# Patient Record
Sex: Male | Born: 1945 | Race: White | Hispanic: No | Smoking: Former smoker
Health system: Southern US, Community
[De-identification: ages and names within clinical notes are randomized; demographics above are authoritative.]

## PROBLEM LIST (undated history)

## (undated) DIAGNOSIS — I2510 Atherosclerotic heart disease of native coronary artery without angina pectoris: Secondary | ICD-10-CM

## (undated) DIAGNOSIS — I10 Essential (primary) hypertension: Secondary | ICD-10-CM

## (undated) DIAGNOSIS — Z9289 Personal history of other medical treatment: Secondary | ICD-10-CM

## (undated) DIAGNOSIS — M1990 Unspecified osteoarthritis, unspecified site: Secondary | ICD-10-CM

## (undated) DIAGNOSIS — G8929 Other chronic pain: Secondary | ICD-10-CM

## (undated) DIAGNOSIS — F329 Major depressive disorder, single episode, unspecified: Secondary | ICD-10-CM

## (undated) DIAGNOSIS — N289 Disorder of kidney and ureter, unspecified: Secondary | ICD-10-CM

## (undated) DIAGNOSIS — I499 Cardiac arrhythmia, unspecified: Secondary | ICD-10-CM

## (undated) DIAGNOSIS — T8859XA Other complications of anesthesia, initial encounter: Secondary | ICD-10-CM

## (undated) DIAGNOSIS — I5031 Acute diastolic (congestive) heart failure: Secondary | ICD-10-CM

## (undated) DIAGNOSIS — T4145XA Adverse effect of unspecified anesthetic, initial encounter: Secondary | ICD-10-CM

## (undated) DIAGNOSIS — I4510 Unspecified right bundle-branch block: Secondary | ICD-10-CM

## (undated) DIAGNOSIS — E785 Hyperlipidemia, unspecified: Secondary | ICD-10-CM

## (undated) DIAGNOSIS — K219 Gastro-esophageal reflux disease without esophagitis: Secondary | ICD-10-CM

## (undated) DIAGNOSIS — M545 Low back pain, unspecified: Secondary | ICD-10-CM

## (undated) DIAGNOSIS — F32A Depression, unspecified: Secondary | ICD-10-CM

## (undated) DIAGNOSIS — D649 Anemia, unspecified: Secondary | ICD-10-CM

## (undated) DIAGNOSIS — D696 Thrombocytopenia, unspecified: Secondary | ICD-10-CM

## (undated) DIAGNOSIS — F419 Anxiety disorder, unspecified: Secondary | ICD-10-CM

## (undated) DIAGNOSIS — N200 Calculus of kidney: Secondary | ICD-10-CM

## (undated) DIAGNOSIS — E119 Type 2 diabetes mellitus without complications: Secondary | ICD-10-CM

## (undated) HISTORY — DX: Atherosclerotic heart disease of native coronary artery without angina pectoris: I25.10

## (undated) HISTORY — PX: CHOLECYSTECTOMY: SHX55

## (undated) HISTORY — PX: APPENDECTOMY: SHX54

## (undated) HISTORY — PX: BACK SURGERY: SHX140

## (undated) HISTORY — DX: Hyperlipidemia, unspecified: E78.5

## (undated) HISTORY — PX: CARDIAC CATHETERIZATION: SHX172

---
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
## 1989-02-22 HISTORY — PX: SHOULDER ARTHROSCOPY W/ ROTATOR CUFF REPAIR: SHX2400

## 1998-06-27 ENCOUNTER — Encounter: Payer: Self-pay | Admitting: Family Medicine

## 1998-06-27 ENCOUNTER — Ambulatory Visit (HOSPITAL_COMMUNITY): Admission: RE | Admit: 1998-06-27 | Discharge: 1998-06-27 | Payer: Self-pay | Admitting: Family Medicine

## 1999-06-25 DIAGNOSIS — Z9289 Personal history of other medical treatment: Secondary | ICD-10-CM

## 1999-06-25 HISTORY — DX: Personal history of other medical treatment: Z92.89

## 1999-06-25 HISTORY — PX: CORONARY ARTERY BYPASS GRAFT: SHX141

## 1999-07-04 ENCOUNTER — Inpatient Hospital Stay (HOSPITAL_COMMUNITY): Admission: EM | Admit: 1999-07-04 | Discharge: 1999-07-14 | Payer: Self-pay | Admitting: Emergency Medicine

## 1999-07-04 ENCOUNTER — Encounter: Payer: Self-pay | Admitting: Emergency Medicine

## 1999-07-05 ENCOUNTER — Encounter: Payer: Self-pay | Admitting: Cardiothoracic Surgery

## 1999-07-09 ENCOUNTER — Encounter: Payer: Self-pay | Admitting: Cardiothoracic Surgery

## 1999-07-10 ENCOUNTER — Encounter: Payer: Self-pay | Admitting: Cardiothoracic Surgery

## 1999-07-11 ENCOUNTER — Encounter: Payer: Self-pay | Admitting: Cardiothoracic Surgery

## 2000-12-01 ENCOUNTER — Encounter: Admission: RE | Admit: 2000-12-01 | Discharge: 2000-12-01 | Payer: Self-pay | Admitting: Otolaryngology

## 2000-12-01 ENCOUNTER — Encounter: Payer: Self-pay | Admitting: Otolaryngology

## 2001-08-20 ENCOUNTER — Encounter: Payer: Self-pay | Admitting: Internal Medicine

## 2001-08-20 ENCOUNTER — Inpatient Hospital Stay (HOSPITAL_COMMUNITY): Admission: EM | Admit: 2001-08-20 | Discharge: 2001-08-22 | Payer: Self-pay | Admitting: *Deleted

## 2002-03-18 ENCOUNTER — Encounter: Payer: Self-pay | Admitting: Orthopedic Surgery

## 2002-03-19 ENCOUNTER — Encounter: Payer: Self-pay | Admitting: Orthopedic Surgery

## 2002-03-19 ENCOUNTER — Ambulatory Visit (HOSPITAL_COMMUNITY): Admission: RE | Admit: 2002-03-19 | Discharge: 2002-03-19 | Payer: Self-pay | Admitting: Orthopedic Surgery

## 2003-09-08 ENCOUNTER — Inpatient Hospital Stay (HOSPITAL_COMMUNITY): Admission: RE | Admit: 2003-09-08 | Discharge: 2003-09-14 | Payer: Self-pay | Admitting: Psychiatry

## 2004-01-24 ENCOUNTER — Ambulatory Visit (HOSPITAL_COMMUNITY): Admission: RE | Admit: 2004-01-24 | Discharge: 2004-01-24 | Payer: Self-pay

## 2004-01-24 ENCOUNTER — Encounter (INDEPENDENT_AMBULATORY_CARE_PROVIDER_SITE_OTHER): Payer: Self-pay | Admitting: *Deleted

## 2009-07-03 HISTORY — PX: CARDIOVASCULAR STRESS TEST: SHX262

## 2010-06-04 ENCOUNTER — Ambulatory Visit: Payer: Self-pay | Admitting: Cardiology

## 2010-08-23 HISTORY — PX: CORONARY ANGIOPLASTY WITH STENT PLACEMENT: SHX49

## 2010-08-29 ENCOUNTER — Other Ambulatory Visit: Payer: Self-pay | Admitting: Orthopedic Surgery

## 2010-08-29 ENCOUNTER — Ambulatory Visit
Admission: RE | Admit: 2010-08-29 | Discharge: 2010-08-29 | Disposition: A | Discharge status: Home / Self Care | Payer: Medicare Other | Source: Ambulatory Visit | Attending: Orthopedic Surgery | Admitting: Orthopedic Surgery

## 2010-08-29 DIAGNOSIS — M545 Low back pain, unspecified: Secondary | ICD-10-CM

## 2010-09-03 ENCOUNTER — Other Ambulatory Visit: Payer: Self-pay | Admitting: Specialist

## 2010-09-03 DIAGNOSIS — M542 Cervicalgia: Secondary | ICD-10-CM

## 2010-09-04 ENCOUNTER — Ambulatory Visit
Admission: RE | Admit: 2010-09-04 | Discharge: 2010-09-04 | Disposition: A | Discharge status: Home / Self Care | Payer: Medicare Other | Source: Ambulatory Visit | Attending: Specialist | Admitting: Specialist

## 2010-09-04 DIAGNOSIS — M542 Cervicalgia: Secondary | ICD-10-CM

## 2010-09-07 ENCOUNTER — Emergency Department (HOSPITAL_COMMUNITY): Payer: Medicare Other

## 2010-09-07 ENCOUNTER — Inpatient Hospital Stay (HOSPITAL_COMMUNITY)
Admission: EM | Admit: 2010-09-07 | Discharge: 2010-09-08 | DRG: 249 | Disposition: A | Discharge status: Home / Self Care | Payer: Medicare Other | Attending: Cardiology | Admitting: Cardiology

## 2010-09-07 DIAGNOSIS — Z7982 Long term (current) use of aspirin: Secondary | ICD-10-CM

## 2010-09-07 DIAGNOSIS — E669 Obesity, unspecified: Secondary | ICD-10-CM | POA: Diagnosis present

## 2010-09-07 DIAGNOSIS — I200 Unstable angina: Secondary | ICD-10-CM | POA: Diagnosis present

## 2010-09-07 DIAGNOSIS — R079 Chest pain, unspecified: Secondary | ICD-10-CM

## 2010-09-07 DIAGNOSIS — E119 Type 2 diabetes mellitus without complications: Secondary | ICD-10-CM | POA: Diagnosis present

## 2010-09-07 DIAGNOSIS — I2510 Atherosclerotic heart disease of native coronary artery without angina pectoris: Secondary | ICD-10-CM | POA: Diagnosis present

## 2010-09-07 DIAGNOSIS — I25810 Atherosclerosis of coronary artery bypass graft(s) without angina pectoris: Secondary | ICD-10-CM

## 2010-09-07 DIAGNOSIS — Z794 Long term (current) use of insulin: Secondary | ICD-10-CM

## 2010-09-07 DIAGNOSIS — I10 Essential (primary) hypertension: Secondary | ICD-10-CM | POA: Diagnosis present

## 2010-09-07 DIAGNOSIS — F329 Major depressive disorder, single episode, unspecified: Secondary | ICD-10-CM | POA: Diagnosis present

## 2010-09-07 DIAGNOSIS — F3289 Other specified depressive episodes: Secondary | ICD-10-CM | POA: Diagnosis present

## 2010-09-07 LAB — CBC
HCT: 37.8 % — ABNORMAL LOW (ref 39.0–52.0)
Hemoglobin: 13.3 g/dL (ref 13.0–17.0)
MCH: 30.6 pg (ref 26.0–34.0)
MCH: 30.2 pg (ref 26.0–34.0)
MCHC: 35.4 g/dL (ref 30.0–36.0)
MCHC: 34.7 g/dL (ref 30.0–36.0)
MCV: 87.1 fL (ref 78.0–100.0)
Platelets: 118 10*3/uL — ABNORMAL LOW (ref 150–400)
Platelets: 116 10*3/uL — ABNORMAL LOW (ref 150–400)
RBC: 4.35 MIL/uL (ref 4.22–5.81)
RBC: 4.34 MIL/uL (ref 4.22–5.81)
RDW: 12.6 % (ref 11.5–15.5)
WBC: 4.4 10*3/uL (ref 4.0–10.5)

## 2010-09-07 LAB — COMPREHENSIVE METABOLIC PANEL
AST: 32 U/L (ref 0–37)
CO2: 26 mEq/L (ref 19–32)
Calcium: 8.6 mg/dL (ref 8.4–10.5)
Chloride: 109 mEq/L (ref 96–112)
Creatinine, Ser: 1.17 mg/dL (ref 0.4–1.5)
GFR calc Af Amer: >60 mL/min (ref >60)

## 2010-09-07 LAB — HEPARIN LEVEL (UNFRACTIONATED): Heparin Unfractionated: <0.1 IU/mL — ABNORMAL LOW (ref 0.30–0.70)

## 2010-09-07 LAB — CARDIAC PANEL(CRET KIN+CKTOT+MB+TROPI)
Relative Index: 1.2 (ref 0.0–2.5)
Total CK: 166 U/L (ref 7–232)

## 2010-09-07 LAB — POCT CARDIAC MARKERS: Myoglobin, poc: 92.6 ng/mL (ref 12–200)

## 2010-09-07 LAB — CK TOTAL AND CKMB (NOT AT ARMC)
CK, MB: 1.9 ng/mL (ref 0.3–4.0)
Relative Index: 1.2 (ref 0.0–2.5)

## 2010-09-07 LAB — LIPID PANEL
HDL: 41 mg/dL (ref >39)
Total CHOL/HDL Ratio: 4 RATIO

## 2010-09-07 LAB — DIFFERENTIAL
Basophils Absolute: 0 10*3/uL (ref 0.0–0.1)
Eosinophils Absolute: 0.1 10*3/uL (ref 0.0–0.7)
Eosinophils Absolute: 0.1 10*3/uL (ref 0.0–0.7)
Eosinophils Relative: 3 % (ref 0–5)
Lymphs Abs: 2.1 10*3/uL (ref 0.7–4.0)
Neutrophils Relative %: 33 % — ABNORMAL LOW (ref 43–77)

## 2010-09-07 LAB — TROPONIN I: Troponin I: 0.04 ng/mL (ref 0.00–0.06)

## 2010-09-07 LAB — APTT: aPTT: 30 seconds (ref 24–37)

## 2010-09-07 LAB — PROTIME-INR: Prothrombin Time: 13 seconds (ref 11.6–15.2)

## 2010-09-07 LAB — HEMOGLOBIN A1C: Hgb A1c MFr Bld: 8.8 % — ABNORMAL HIGH (ref <5.7)

## 2010-09-07 LAB — BASIC METABOLIC PANEL
BUN: 13 mg/dL (ref 6–23)
CO2: 26 mEq/L (ref 19–32)
Chloride: 106 mEq/L (ref 96–112)
Sodium: 135 mEq/L (ref 135–145)

## 2010-09-07 LAB — GLUCOSE, CAPILLARY

## 2010-09-08 DIAGNOSIS — R079 Chest pain, unspecified: Secondary | ICD-10-CM

## 2010-09-08 LAB — BASIC METABOLIC PANEL
CO2: 26 mEq/L (ref 19–32)
Calcium: 8.4 mg/dL (ref 8.4–10.5)
Chloride: 104 mEq/L (ref 96–112)
GFR calc non Af Amer: 55 mL/min — ABNORMAL LOW (ref >60)
Glucose, Bld: 219 mg/dL — ABNORMAL HIGH (ref 70–99)
Potassium: 4 mEq/L (ref 3.5–5.1)
Sodium: 135 mEq/L (ref 135–145)

## 2010-09-08 LAB — CBC
Platelets: 107 10*3/uL — ABNORMAL LOW (ref 150–400)
RDW: 12.6 % (ref 11.5–15.5)
WBC: 4.2 10*3/uL (ref 4.0–10.5)

## 2010-09-08 LAB — GLUCOSE, CAPILLARY
Glucose-Capillary: 258 mg/dL — ABNORMAL HIGH (ref 70–99)
Glucose-Capillary: 229 mg/dL — ABNORMAL HIGH (ref 70–99)

## 2010-09-09 NOTE — Discharge Summary (Addendum)
NAMEANANTH, FIALLOS                ACCOUNT NO.:  [id number]  MEDICAL RECORD NO.:  [id number]           PATIENT TYPE:  I  LOCATION:  2927                         FACILITY:  [facility]  PHYSICIAN:  Vesta Mixer, M.D. DATE OF BIRTH:  1945-10-25  DATE OF ADMISSION:  09/07/2010 DATE OF DISCHARGE:  09/08/2010                              DISCHARGE SUMMARY   DISCHARGE DIAGNOSES: 1. Chest pain consistent with unstable angina with catheterization     showing occluded saphenous vein graft to the right coronary artery     and high-grade disease and saphenous vein graft to the sequential     obtuse marginal 1/obtuse marginal 2, status post successful     percutaneous coronary intervention/bare-metal stent placement to     the saphenous vein graft to the ramus intermedius and obtuse     marginal on September 07, 2010. 2. Coronary artery disease, status post coronary artery bypass graft     in 2001. 3. Hypertension. 4. Insulin-dependent diabetes. 5. Obesity. 6. Back pain, back surgery postponed 6 weeks.  HOSPITAL COURSE:  Mr. Churchwell is a 65 year old gentleman with a history of known CAD, hypertension, and insulin-dependent diabetes who has developed progressive anginal symptoms.  He was previously able to walk 2 miles a day at the gym and track but, however, over the past month has had increasing angina with his daily walk requiring him to sit down and rest.  The symptoms have increased in frequency and intensity.  He had a number of episodes while bringing groceries on the day of admission. His symptoms are felt consistent with unstable angina, and he was started on statin, IV heparin, and continued on home dose of aspirin, beta blocker, and Imdur.  He underwent cardiac catheterization yesterday, which demonstrated high-grade disease of the SVG to the ramus intermedius and OM.  Dr. Excell Seltzer subsequently placed bare-metal stent platform into this vessel.  He recommended that the patient receive  at least 30 days of Effient as well as long-term low-dose aspirin.  Dr. Elease Hashimoto felt that his back surgery should be postponed for 6 weeks.  The patient did well post procedurally and ambulated well with cardiac rehab.  Dr. Elease Hashimoto has seen and examined him today and feels he is stable for discharge.  DISCHARGE LABORATORIES:  WBC 4.2, hemoglobin 11.9, hematocrit 35.2, platelet count 107.  Sodium 135, potassium 4.0, chloride 104, CO2 26, glucose 219, BUN 11, creatinine 1.30.  LFTs within normal limits on September 07, 2010.  A1c is 8.8.  Total cholesterol 173, triglycerides 32, HDL 27.  FOBT was negative.  STUDIES: 1. Chest x-ray on September 07, 2010, showed no evidence of active     pulmonary disease. 2. Cardiac catheterization on September 07, 2010, please see full report     for details.  DISCHARGE MEDICATIONS: 1. Aspirin 81 mg daily. 2. Nitroglycerin sublingual 0.4 mg every 5 minutes as needed up to 3     doses. 3. Effient 10 mg daily. 4. Crestor 20 mg daily. 5. Amlodipine 5 mg daily. 6. Coreg 25 mg b.i.d. 7. Cholecalciferol 2000 units 1 tablet daily. 8. Citalopram 20 mg nightly.  9. Hydrocodone/APAP 10/325 mg 1-2 tablets q.4 h. p.r.n. severe pain. 10.Imdur 30 mg daily. 11.Lisinopril 5 mg half tablet daily. 12.NovoLog Mix 70/30, 40 units subcutaneously b.i.d. 13.Omeprazole 20 mg b.i.d. 14.Symlin 120 mcg subcutaneously t.i.d. 15.Zolpidem 10 mg daily nightly p.r.n.  DISPOSITION:  The patient will be discharged in stable condition to home.  He is instructed to increase activity slowly and not to lift or participate in sexual activity for 1 week.  He is not to drive for 2 days.  He is to follow a low-sodium heart-healthy diabetic diet.  If he notices any pain, swelling, bleeding, or pus at his cath site, he is to call or return.  He will follow up with Dr. Patty Sermons in approximately 1- 2 weeks and our office will call him with this appointment.  Dr. Elease Hashimoto has suggested that his back  surgery will be delayed for 6 weeks.  DURATION OF DISCHARGE ENCOUNTER:  Greater than 30 minutes including physician and PA time.     Ronie Spies, P.A.C.   ______________________________ Vesta Mixer, M.D.    DD/MEDQ  D:  09/08/2010  T:  09/09/2010  Job:  [id number]  cc:   Cassell Clement, M.D.  Electronically Signed by Ronie Spies  on 09/09/2010 02:05:13 PM Electronically Signed by Kristeen Miss M.D. on 10/02/2010 09:07:05 AM

## 2010-09-10 LAB — GLUCOSE, CAPILLARY
Glucose-Capillary: 199 mg/dL — ABNORMAL HIGH (ref 70–99)
Glucose-Capillary: 194 mg/dL — ABNORMAL HIGH (ref 70–99)

## 2010-09-11 NOTE — Procedures (Signed)
NAMEHECTOR, Tyler Le NO.:  [id number]  MEDICAL RECORD NO.:  [id number]           PATIENT TYPE:  I  LOCATION:  2927                         FACILITY:  [facility]  PHYSICIAN:  Tyler Fells. Excell Seltzer, MD  DATE OF BIRTH:  1946/01/12  DATE OF PROCEDURE:  09/07/2010 DATE OF DISCHARGE:                           CARDIAC CATHETERIZATION   PROCEDURE:  Percutaneous transluminal coronary angioplasty and stenting of the saphenous vein graft sequenced to ramus intermedius and obtuse marginal 1.  PROCEDURAL INDICATION:  Tyler Le is a 65 year old gentleman who presented with unstable angina.  He underwent diagnostic catheterization this evening by Dr. Antoine Le.  The patient has had prior coronary artery bypass surgery in 2001.  He was noted to have severe diffuse distal vessel disease.  He has a vein graft that is sequenced to the ramus intermedius and obtuse marginal branch that has critical ostial disease. The distal branch vessels also have severe disease.  The patient had been scheduled for upcoming back surgery next week.  We felt his coronary artery disease was critical and decided to initially just treat his saphenous vein graft ostial lesion with plans for a bare-metal stent platform.  While we were prepping for the intervention, the patient developed severe chest discomfort.  We had to start him on IV nitroglycerin, gave him narcotic analgesics, and gave him IV beta blocker.  We upsized a 6-French sheath and started him on bivalirudin. He was given 60 mg of Effient.  A 6-French AL-1 guide was inserted and Whisper guidewire was advanced across the lesion.  I was not able to cross the lesion with a Cougar wire.  The vessel was predilated with a 2.5 x 12 mm apex balloon, which was taken to 10 atmospheres on 2 inflations.  The patient's pain began to improve after the vessel was dilated.  At that point, we put in a 5-mm spider distal embolic protection device.  I initially  attempted to place a 4.0 x 18 mm Multilink Vision bare-metal stent, but the stent was too short when it was positioned.  It was taken out undeployed and a 4.0 x 23 mm Multilink Vision stent was carefully positioned, it then deployed at 14 atmospheres.  The proximal part of the stent seemed underexpanded and it was postdilated with a 4.0 x 12 mm [place] Quantum apex, which was taken to 16 and 18 atmospheres.  There was an excellent angiographic result with TIMI 3 flow.  The patient tolerated the procedure well.  The spider device was retrieved with retrieval sheath.  The femoral angiogram demonstrated access just below the femoral bifurcation, and we decided to use manual pressure for hemostasis.  FINAL CONCLUSIONS:  Successful PCI of the saphenous vein graft to ramus intermedius and obtuse marginal using a bare-metal stent platform.  RECOMMENDATIONS:  The patient should receive at least 30 days of Effient as well as long-term low-dose aspirin.  He would benefit from 12 months of Effient in the setting of his acute coronary syndrome.  I am not sure if he is going to be a candidate for back surgery, and we will discuss this with Dr.  Brackbill, his primary cardiologist.     Tyler Fells. Excell Seltzer, MD     MDC/MEDQ  D:  09/07/2010  T:  09/08/2010  Job:  [id number]  cc:   Tyler Le, M.D. Tyler Le, M.D.  Electronically Signed by Tyler Bollman MD on 09/11/2010 06:04:17 PM

## 2010-09-12 ENCOUNTER — Other Ambulatory Visit (HOSPITAL_COMMUNITY): Payer: Medicare Other

## 2010-09-17 ENCOUNTER — Inpatient Hospital Stay (HOSPITAL_COMMUNITY): Admission: RE | Admit: 2010-09-17 | Payer: Medicare Other | Source: Ambulatory Visit | Admitting: Specialist

## 2010-10-03 ENCOUNTER — Encounter: Payer: Self-pay | Admitting: Cardiology

## 2010-10-03 ENCOUNTER — Encounter: Payer: Self-pay | Admitting: *Deleted

## 2010-10-03 ENCOUNTER — Telehealth: Payer: Self-pay | Admitting: Cardiology

## 2010-10-03 ENCOUNTER — Ambulatory Visit (INDEPENDENT_AMBULATORY_CARE_PROVIDER_SITE_OTHER): Payer: Medicare Other | Admitting: Cardiology

## 2010-10-03 DIAGNOSIS — I119 Hypertensive heart disease without heart failure: Secondary | ICD-10-CM | POA: Insufficient documentation

## 2010-10-03 DIAGNOSIS — E785 Hyperlipidemia, unspecified: Secondary | ICD-10-CM | POA: Insufficient documentation

## 2010-10-03 DIAGNOSIS — I209 Angina pectoris, unspecified: Secondary | ICD-10-CM | POA: Insufficient documentation

## 2010-10-03 DIAGNOSIS — E119 Type 2 diabetes mellitus without complications: Secondary | ICD-10-CM | POA: Insufficient documentation

## 2010-10-03 DIAGNOSIS — F419 Anxiety disorder, unspecified: Secondary | ICD-10-CM | POA: Insufficient documentation

## 2010-10-03 DIAGNOSIS — Z951 Presence of aortocoronary bypass graft: Secondary | ICD-10-CM

## 2010-10-03 DIAGNOSIS — M545 Low back pain: Secondary | ICD-10-CM

## 2010-10-03 MED ORDER — ISOSORBIDE MONONITRATE ER 30 MG PO TB24: 30.0000 mg | ORAL_TABLET | Freq: Every day | ORAL | Status: DC

## 2010-10-03 NOTE — Assessment & Plan Note (Signed)
The patient remains very anxious.  His wife feels that his anxiety often triggers his angina.  We will increase his Xanax to 0.5 mg 4 times a day

## 2010-10-03 NOTE — Assessment & Plan Note (Signed)
The patient was recently hospitalized from 09/07/10-09/08/10.  He was admitted with chest pain and he underwent cardiac catheterization and was found to have high-grade stenosis of the saphenous vein graft to the ramus intermedius and obtuse marginal.  Dr. Excell Seltzer placed a bare metal stent into the vessel.  The patient is now on caffeine for the next 30 days as well as long-term aspirin.  He has continued to have some vague pain in the left chest.  He has not been using any sublingual nitroglycerin and he does not think that he has been taking the Imdur that we described when he left the hospital.

## 2010-10-03 NOTE — Telephone Encounter (Signed)
PT WAS JUST HERE THIS MORNING AND  WANTS TO GO OVER MEDS. PT'S WIFE CALLED.

## 2010-10-03 NOTE — Assessment & Plan Note (Signed)
The patient was to have had back surgery recently but that had to be postponed because of his admission for chest pain and need for a bare metal stent.  The patient reports that his back is feeling a little better and we will postpone the back surgery a while longer.

## 2010-10-03 NOTE — Assessment & Plan Note (Signed)
The patient's diabetes has not been under optimal control.  The patient eats what he feels like eating and he is overweight.  His diabetes is followed at the [place].

## 2010-10-03 NOTE — Progress Notes (Signed)
History of Present Illness: This pleasant 65 year old gentleman is seen back for a post hospital office visit.  He was hospitalized on 09/07/10 with chest pain and underwent cardiac catheterization and bare-metal stent to the saphenous vein graft to the ramus intermedius and obtuse marginal vessel.  He is now on caffeine for at least the next 30 days following the stent and he'll be on long-term low-dose aspirin.  He has a history of coronary disease and had his original coronary artery bypass graft surgery in 2001.  He also has a history of dyslipidemia essential hypertension and insulin-dependent diabetes as well as obesity.  His back surgery has been postponed for at least 6 weeks post stent.  Current Outpatient Prescriptions  Medication Sig Dispense Refill  . ALPRAZolam (XANAX) 0.5 MG tablet Take 0.5 mg by mouth 3 (three) times daily as needed.        Marland Kitchen amLODipine (NORVASC) 5 MG tablet Take 5 mg by mouth daily.        Marland Kitchen aspirin 325 MG tablet Take 325 mg by mouth daily.        . carvedilol (COREG) 25 MG tablet Take 25 mg by mouth 2 (two) times daily with a meal.        . ergocalciferol (VITAMIN D2) 50000 UNITS capsule Take 3,000 Units by mouth daily.        Marland Kitchen guaiFENesin (MUCINEX) 600 MG 12 hr tablet Take 1,200 mg by mouth 2 (two) times daily.        Marland Kitchen ibuprofen (ADVIL,MOTRIN) 200 MG tablet Take 200 mg by mouth every 6 (six) hours as needed.        . insulin glargine (LANTUS) 100 UNIT/ML injection Inject 40 Units into the skin 2 (two) times daily.       Marland Kitchen lisinopril (PRINIVIL,ZESTRIL) 10 MG tablet Take 10 mg by mouth daily.        . Multiple Vitamin (MULTIVITAMIN) tablet Take 1 tablet by mouth daily.        . pramlintide (SYMLIN) 600 MCG/ML injection Inject 60 mcg into the skin 2 (two) times daily.        . rosuvastatin (CRESTOR) 10 MG tablet Take 10 mg by mouth daily.        . sertraline (ZOLOFT) 100 MG tablet Take 100 mg by mouth daily.        Marland Kitchen zolpidem (AMBIEN) 10 MG tablet Take 10 mg by  mouth at bedtime as needed.        Marland Kitchen DISCONTD: isosorbide dinitrate (ISORDIL) 40 MG tablet Take 40 mg by mouth daily. 1/2 tablet daily       . isosorbide mononitrate (IMDUR) 30 MG 24 hr tablet Take 1 tablet (30 mg total) by mouth daily.  90 tablet  3  . simvastatin (ZOCOR) 20 MG tablet Take 20 mg by mouth at bedtime.          Allergies  Allergen Reactions  . Altace   . Metformin And Related   . Pravachol   . Wellbutrin (Bupropion Hcl)     Patient Active Problem List  Diagnoses  . S/P CABG (coronary artery bypass graft)  . Angina pectoris  . Diabetes mellitus  . Dyslipidemia  . Low back pain  . Anxiety  . Benign hypertensive heart disease without heart failure    History  Smoking status  . Former Smoker  . Quit date: 06/24/1988  Smokeless tobacco  . Not on file    History  Alcohol Use No  Family History  Problem Relation Age of Onset  . Heart disease Mother   . Heart disease Father   . Heart attack Father   . Heart disease Brother     Review of Systems: Constitutional: no fever chills diaphoresis or fatigue or change in weight.  Head and neck: no hearing loss, no epistaxis, no photophobia or visual disturbance. Respiratory: No cough, shortness of breath or wheezing. Cardiovascular: No chest pain peripheral edema, palpitations. Gastrointestinal: No abdominal distention, no abdominal pain, no change in bowel habits hematochezia or melena. Genitourinary: No dysuria, no frequency, no urgency, no nocturia. Musculoskeletal:No arthralgias, no back pain, no gait disturbance or myalgias. Neurological: No dizziness, no headaches, no numbness, no seizures, no syncope, no weakness, no tremors. Hematologic: No lymphadenopathy, no easy bruising. Psychiatric: No confusion, no hallucinations, no sleep disturbance.    Physical Exam: Filed Vitals:   10/03/10 1022  BP: 144/88  Pulse: 84  Weight is 279, down 2 pounds.  The general appearance reveals a well-developed large  gentleman in no acute distress.Pupils equal and reactive.   Extraocular Movements are full.  There is no scleral icterus.  The mouth and pharynx are normal.  The neck is supple.  The carotids reveal no bruits.  The jugular venous pressure is normal.  The thyroid is not enlarged.  There is no lymphadenopathy.The chest is clear to percussion and auscultation. There are no rales or rhonchi. Expansion of the chest is symmetrical.The precordium is quiet.  The first heart sound is normal.  The second heart sound is physiologically split.  There is no murmur gallop rub or click.  There is no abnormal lift or heave.The abdomen is soft and nontender. Bowel sounds are normal. The liver and spleen are not enlarged. There Are no abdominal masses. There are no bruits.The pedal pulses are good.  There is no phlebitis or edema.  There is no cyanosis or clubbing.Strength is normal and symmetrical in all extremities.  There is no lateralizing weakness.  There are no sensory deficits.   Assessment / Plan: We should probably postpone his back surgery for several months unless his symptoms worsen to the point that he needs urgent surgery.  He reports that his back pain has actually improved since we last saw him.  He needs to work harder on diet and weight loss.  Needs to walk regularly for exercise.

## 2010-10-16 ENCOUNTER — Telehealth: Payer: Self-pay | Admitting: Cardiology

## 2010-10-16 NOTE — Telephone Encounter (Signed)
Since starting crestor he feels like his bones ache.  Previously taking simvastatin 80 mg 1/2 daily.  Please advise on crestor and effient.

## 2010-10-16 NOTE — Telephone Encounter (Signed)
Received call from patient has two questions, when he left the hospital, he was advised to take Crestor, he was taking Simvastatin, which he gets free from [place], can he take the Simvastatin?  And he was given a script for Effifedent (blood thinner) and was told to take for 30 days.  Why was he given 5 refills, will he have to continue taking this med?

## 2010-10-16 NOTE — Telephone Encounter (Signed)
Since he has a bare metal stent he needs a minimum of one month. If he can afford it stay on it longer otherwise stop it and take ASA alone [forever]. Okay to go back to the simvastatin if he was tolerating it and it is cheaper for him as long as the lipids stay in range.

## 2010-10-17 NOTE — Telephone Encounter (Signed)
Advised ok to go back on Zocor, but only 20 mg daily (patient takes Norvasc daily).  Patient will continue Effient for another month and then just continue ASA there after.  Patient verbalized understanding.

## 2010-10-17 NOTE — Telephone Encounter (Signed)
Will recheck labs at July office visit

## 2010-10-19 NOTE — Cardiovascular Report (Signed)
  NAMEBONIFACE, GOFFE NO.:  [id number]  MEDICAL RECORD NO.:  [id number]           PATIENT TYPE:  I  LOCATION:  2927                         FACILITY:  [facility]  PHYSICIAN:  Rollene Rotunda, MD, FACCDATE OF BIRTH:  1945-07-31  DATE OF PROCEDURE:  09/07/2010 DATE OF DISCHARGE:  09/08/2010                           CARDIAC CATHETERIZATION   CARDIOLOGIST:  Cassell Clement, MD  PRIMARY CARE PHYSICIAN:  Windle Guard, MD  PROCEDURE:  Left heart catheterization/coronary arteriography.  INDICATIONS:  Patient with unstable angina and known bypass grafting.  PROCEDURE:  Left heart catheterization was performed via the right femoral artery.  The aorta was cannulated using anterior wall puncture. A #5-French arterial sheath was inserted via the modified Seldinger technique.  Preformed Judkins and pigtail catheter were utilized.  The patient tolerated the procedure well.  RESULTS:  Hemodynamics:  LV 111/8, AO 111/67.  Coronaries:  The left main had distal 25% stenosis.  The LAD had long proximal 60% stenosis. There was a mid occlusion.  There was a distal long 60-70% stenosis. The diagonal branch was branching.  It was occluded at the ostium.  It filled via a vein graft.  There was a superior branch that back-filled across a long 60-70% stenosis.  Circumflex in the AV groove had diffuse 30-40% disease.  There was a ramus intermediate and an obtuse marginal that were occluded at the ostium and filled via grafts.  The ramus intermedius had proximal long 80% stenosis immediately after the insertion of the graft.  Second obtuse marginal had proximal 75% stenosis before the anastomosis of the graft.  The right coronary artery is dominant.  It is occluded proximally.  It fills via collaterals. There is a PDA which is long with proximal 80% stenosis.  Posterolateral is moderate sized with no high-grade disease.  Grafts:  LIMA to the LAD is patent.  The saphenous vein graft to  the right coronary artery is occluded.  Saphenous vein graft sequential to OM-1 and OM-2 has proximal 95% stenosis.  There is a 50% stenosis at the anastomosis.  There is diffuse 25-30% plaque scattered throughout.  Saphenous vein graft to the diagonal has diffuse 25-30% plaque throughout.  Left ventriculogram: The left ventriculogram was obtained in the RAO projection.  The EF was 65% with normal wall motion.  CONCLUSION:  Severe native three-vessel coronary disease.  Occluded saphenous vein graft to the right coronary artery.  High-grade disease in the saphenous vein graft to the ramus intermediate/obtuse marginal.  PLAN:  The patient will have percutaneous revascularization of the vein graft to the obtuse marginal as this is the most significant visual lesion.  However, he has very severe diffuse disease and will need aggressive medical management.     Rollene Rotunda, MD, [facility]     JH/MEDQ  D:  09/07/2010  T:  09/08/2010  Job:  [id number]  Electronically Signed by Rollene Rotunda MD [facility] on 10/19/2010 11:42:27 AM

## 2010-10-22 NOTE — H&P (Signed)
NAMEGERHARDT, GLEED NO.:  [id number]  MEDICAL RECORD NO.:  [id number]           PATIENT TYPE:  E  LOCATION:  [facility]                         FACILITY:  [facility]  PHYSICIAN:  Therisa Doyne, MD    DATE OF BIRTH:  1945/11/16  DATE OF ADMISSION:  09/07/2010 DATE OF DISCHARGE:                             HISTORY & PHYSICAL   Admission to [facility], Dr. Patty Sermons Service.  CHIEF COMPLAINT:  Chest pain.  HISTORY OF PRESENT ILLNESS:  A 65 year old white male with a past medical history significant for coronary artery disease, status post coronary bypass grafting who presents for evaluation of chest pain. Historically, the patient underwent five-vessel CABG in 2001.  He had a three-vessel native coronary artery disease.  His CABG include a LIMA to the LAD with a saphenous vein graft to the diagonal, saphenous vein graft to RCA and saphenous vein graft sequentially to the ramus in the OM.  He had a subsequent cardiac catheterization in 2003, which revealed native triple-vessel coronary artery disease.  However, all of his bypass grafts were patent.  There was some residual disease distal to his bypass grafts in his native coronary arteries.  However, this was medically treated.  For the past 1 month, the patient has had progressive anginal symptoms. Prior to 1 month ago, he was able to walk two miles a day at the gym on the track; however, over the past 1 month, he has had increasing angina when he does his daily walk.  After doing about 10 laps, he is to sit down and rest.  He developed left-sided chest pressure that radiates to his shoulder and back.  This evening, his symptoms increased in frequency and in intensity and he had three episodes this evening with even moderate activities such as bring in the groceries.  He had severe shortness of breath associated with as well as some mild diaphoresis. Because of these symptoms, he came to emergency department  for evaluation.  Of note, the patient is scheduled to have back surgery this coming month on September 17, 2010.  He does report bit compliance to his medication, but notes that he only takes his aspirin approximately two to three times per week.  PAST MEDICAL HISTORY: 1. Coronary artery disease, status post coronary artery bypass     grafting (see history of present illness). 2. Hypertension. 3. Insulin-dependent diabetes. 4. Obesity. 5. Back pain, but currently back surgery pending.  SOCIAL HISTORY:  The patient lives at home.  He denies any active tobacco or drug use.  He drinks less than two times per month.  FAMILY HISTORY:  Notable for father had myocardial infarction in his 50.  ALLERGIES:  No known drug allergies.  MEDICATIONS: 1. Aspirin 325 mg daily. 2. Symlin 60 mcg b.i.d. injection. 3. Omeprazole 20 mg b.i.d. 4. Norvasc 5 mg daily. 5. Lisinopril 2.5 mg daily. 6. Coreg 25 mg b.i.d. 7. Vitamin D3. 8. Ambien p.r.n. 9. Celexa 20 mg daily. 10.Imdur 30 mg. 11.Vicodin p.r.n. 12.NovoLog 70/30 40 units b.i.d.  REVIEW OF SYSTEMS:  All systems were reviewed and are negative except mentioned above  in History of present illness.  PHYSICAL EXAMINATION:  VITAL SIGNS:  Temperature afebrile, blood pressure is 120/60, heart rate 90, respirations 20, oxygen 97% on room air. GENERAL:  No acute distress. HEENT:  Normocephalic atraumatic.  Pupils are equal, round and reactive to light and accommodation.  Oropharynx pink and moist without any lesions. NECK:  Supple without lymphadenopathy.  No jugular venous distention. No masses. CARDIOVASCULAR:  Regular rate and rhythm with no murmurs, rubs or gallops. CHEST:  Clear to auscultation bilaterally. ABDOMEN:  Positive for bowel sounds, soft, nontender and nondistended. EXTREMITIES:  No clubbing, cyanosis or edema.  Dorsalis pedis pulses 2+ bilaterally.  Femoral pulses 2+ bilaterally. SKIN:  No rashes. NEUROLOGIC:  No focal  deficits. PSYCHIATRY:  Normal affect.  PERTINENT LABORATORY DATA:  CBC shows white count of 4.4, hemoglobin 13.3, platelets 118.  BMP is notable for BUN 13, creatinine 0.9. Troponin less than 0.05.  EKG from 12:47 a.m. demonstrates sinus rhythm at 90 beats per minute with an incomplete left bundle-branch block.  No signs of ischemia.  No change from previous EKGs.  Chest x-ray shows no acute cardiopulmonary disease.  IMPRESSION AND PLAN:  Mr. Perrot is a 65 year old white male with past medical history significant for coronary artery disease, status post coronary artery bypass grafting who presents with crescendo angina symptoms that are worrisome for unstable angina. 1. We will admit the patient to [facility] Cardiology under Dr.     Yevonne Pax care. 2. Chest pain.  The history is very concerning for unstable angina as     he had an increase in frequency and intensity of his anginal     episodes.  Given the last time he had an invasive evaluation was     almost 10 years ago and the fact that he has an upcoming back     surgery, I feel that an invasive evaluation wasof his coronaries      with coronary angiography would be appropriate to define     his coronary anatomy and better risk stratify him for his upcoming     surgery.  I have discussed this with the patient and he is     agreeable undergoing a cardiac catheterization (LCGP).  In the     interim time, we will medically treat him by starting on IV     heparin.  He is continue on his home dose of aspirin, beta-blocker     and Imdur.  We will be started him on Lipitor 40 mg daily.  We will     cycle cardiac enzymes and check a fasting lipid profile. 3. Hypertension, this seems to be well controlled.  Continue home     medications. 4. Diabetes mellitus.  Check hemoglobin A1c.  We will place the     patient on sliding scale insulin and cover him with a low-dose     Lantus 20 units at night. 5. Depression.  Continue home  Celexa. 6. Fluids, electrolytes, nutrition:  Saline lock, IV fluids,     electrolytes are stable, n.p.o. 7. Deep venous thrombosis prophylaxis, not indicated as the patient is     on systemic anticoagulation with heparin.     Therisa Doyne, MD     SJT/MEDQ  D:  09/07/2010  T:  09/07/2010  Job:  [id number]  Electronically Signed by Aldona Bar MD on 10/22/2010 09:50:59 PM

## 2010-11-09 NOTE — Cardiovascular Report (Signed)
[place]  Patient:    Tyler Le, Tyler Le Visit Number: [id number] MRN: [id number]          Service Type: MED Location: [id number] Attending Physician:  Tyler Le Dictated by:   Tyler Le., M.D. Proc. Date: 08/21/01 Admit Date:  08/20/2001   CC:         Tyler Le. Tyler Le, M.D.  Tyler Le, M.D.  Tyler Le, M.D.   Cardiac Catheterization  HISTORY: Tyler Le is a 65 year old gentleman with a history of coronary artery disease and is status post coronary artery bypass grafting.  He also has a history of borderline diabetes mellitus.  He had coronary artery bypass grafting x 5 in January 2001.  He has not felt well since that time and has had persistent episodes of angina.  He is admitted to the hospital for further episodes of chest pain.  He ruled out for myocardial infarction.  CARDIOLOGIST:  Tyler Le., M.D.  PROCEDURE:  Left heart catheterization with coronary angiography.  DESCRIPTION OF PROCEDURE:  The right femoral artery was easily cannulated using a modified Seldinger technique.  HEMODYNAMICS:  The left ventricular pressure was 118/17 with an aortic pressure was 117/74.  ANGIOGRAPHY:  The left main coronary artery has minor luminal irregularities.  The left anterior descending artery is moderately diseased and is then occluded.  The LAD can be seen later filling from the internal mammary graft.  The first diagonal vessel arises before the occlusion of the LAD.  It is a moderate to small vessel.  There is diffuse disease throughout its course. There is a 95% stenosis in the distal aspect of this graft.  There is competitive filling of the diagonal vessel but proximal to this stenosis.  The left circumflex artery is a moderate size vessel.  It is occluded distally.  There is a small intermediate branch which is occluded at its mid point.  The right coronary artery is  occluded proximally.  The saphenous vein graft to the right coronary artery is a large graft.  The anastomosis to the distal right coronary artery is fairly normal.  There is a long 60 to 70% stenosis in the distal right coronary artery just distal to the anastomosis at this site.  This stenosis is in the vessel that supplied the posterolateral segment artery and a small posterior descending artery.  In the mid right coronary artery going up retrograde from the graft, there is a tight 95% stenosis prior to giving off an RV marginal branch.  This stenosis is fairly tight.  It does not appear that this would be accessible with angioplasty.  The saphenous vein graft to the diagonal vessel is a very small vessel.  It is relatively normal.  The saphenous vein graft to the intermediate branch and to the obtuse marginal branch is large and normal.  The anastomosis is normal.  LEFT VENTRICULOGRAM:  A left ventriculogram reveals normal left ventricular systolic function.  There are no segmental wall motion abnormalities.  CONCLUSIONS:  The patient has severe native vessel coronary artery disease. The saphenous vein grafts appear to be fairly normal.  The graft to the diagonal vessel anastomosis is prior to a tight stenosis.  This tight stenosis goes very far out on the diagonal vessel and may have been too far out to place the saphenous vein graft.  The right coronary artery graft anastomosis is normal, but there are two separate stenoses that block  flow to the distal right coronary artery.  In the original angiograms, the right coronary artery was occluded so that we could only see faint filling of the distal right coronary artery by collaterals. Unfortunately, the right coronary artery was diffusely diseased, and there are stenoses on either side of the graft anastomosis.  I do not think that either of these sites are very accessible with angioplasty.  The diagonal vessel is very small,  and the anastomosis is quite distal.  The right coronary artery has two discrete stenoses on either side of the anastomosis of the graft.  The less severe of the two stenoses would be accessible, but it does not appear to be flow limiting.  The more tight of the two stenoses would require a bend of approximately 120 to 150 degrees and then a second 180 degree bend.  I think it would be too risky to attempt angioplasty of this lesion.  We will continue with medical therapy.  I have discussed this case with Dr. Patty Le. Dictated by:   Tyler Le., M.D. Attending Physician:  Tyler Le DD:  08/21/01 TD:  08/21/01 Job: [id number]

## 2010-11-09 NOTE — Op Note (Signed)
NAME:  Tyler Le, Tyler Le                          ACCOUNT NO.:  [id number]   MEDICAL RECORD NO.:  [id number]                   PATIENT TYPE:  AMB   LOCATION:  DAY                                  FACILITY:  [facility]   PHYSICIAN:  Lorre Munroe., M.D.            DATE OF BIRTH:  1946/03/05   DATE OF PROCEDURE:  01/24/2004  DATE OF DISCHARGE:                                 OPERATIVE REPORT   PREOPERATIVE DIAGNOSES:  1. Symptomatic gallstones.  2. Chronic cholecystitis.   POSTOPERATIVE DIAGNOSES:  1. Symptomatic gallstones.  2. Chronic cholecystitis.   OPERATION:  Laparoscopic cholecystectomy.   SURGEON:  Lebron Conners, M.D.   ASSISTANTEarlene Plater.   ANESTHESIA:  General.   PROCEDURE:  After the patient was monitored and anesthetized and had routine  preparation and draping of abdomen, I liberally infused local anesthetic  just below the umbilicus and subsequently at three additional sites for  placement of ports.  I made a sharp transverse incision below the umbilicus,  dissected down through the fat to the fascia, opened the fascia  longitudinally in the midline and bluntly opened the peritoneum.  After placing the 0 Vicryl pursestring suture in the fascia and securing a  Hasson disposable cannula, I inflated the abdomen with CO2 and put in the  camera.  I saw no abnormalities.  The gallbladder was somewhat distended but  not acutely inflamed.  After placement of the three additional ports under  direct vision through anesthetized sites and positioning the patient head  up, foot down and tilted toward the left, I retracted the fundus of the  gallbladder towards the right shoulder and took down the few adhesions which  were present from the gallbladder to other viscera and pulled the  infundibulum laterally.  Because the gallbladder was tightly distended, it  was necessary to decompress it with a suction aspirator and then handling  was much easier.  I dissected the  hepatoduodenal ligament anteriorly and  posteriorly until there was a large window between the cystic duct and the  liver and I could clearly see the cystic duct emerging from the infundibulum  on the gallbladder.  I clipped the cystic duct with four clips and divided  between the two which were closest to the gallbladder.  I then similarly  dissected, clipped and divided the cystic artery.  I found one additional  small artery between the liver and gallbladder as I dissected upward,  removing the gallbladder.  Retracting the gallbladder laterally, I used the  spatula and hook dissectors with cautery to detach it from the liver.  I  gained hemostasis in the gallbladder fossa with the cautery.  After  detaching the gallbladder, I placed it in a plastic pouch and subsequently  removed it through the umbilical incision and tied the pursestring suture.  I copiously irrigated the gallbladder fossa and right upper quadrant and  removed that irrigant.  After  that was  done and the gallbladder had been removed, I removed the lateral ports under  direct vision then allowed the CO2 to escape and removed the epigastric  port.  I closed all of the skin incisions with intracuticular 4-0 Vicryl and  Steri-Strips.  The patient was stable throughout the procedure.                                               Lorre Munroe., M.D.    Jodi Marble  D:  01/24/2004  T:  01/24/2004  Job:  [id number]   cc:   Windle Guard, M.D.  [address]  [place], [place] [address]  Fax: [phone]   Cassell Clement, M.D.  [address]  [place]  [place] [address]  Fax: [phone]

## 2010-11-09 NOTE — Consult Note (Signed)
[place]. [facility]  Patient:    Tyler Le                   MRN: [id number] Proc. Date: 07/04/99 Adm. Date:  [id number] Attending:  Rudean Hitt CC:         [name], M.D., [place], [place]                          Consultation Report  CHIEF COMPLAINT:  Chest pain.  HISTORY:  This is a 65 year old Caucasian gentleman who is admitted with chest pain.  He is seen in [facility] emergency room for evaluation.  He gives a history that since about Christmas of 2000, patient has been experiencing increasing chest discomfort.  Looking back, he thinks that for the past several years, he has noted occasional tightness in his chest if he walks but he did not seek medical attention about it and did not think much of it.  Recently, the severity and frequency of the pain have increased.  One week ago while at work, he had a bad spell and he got  dizzy and diaphoretic and was concerned that he might be having a heart attack. A coworker loaned him some nitroglycerin which helped him.  He did not seek medical attention at that time.  Over the weekend, he continued to have some mild intermittent discomfort and then again last night, he was bothered all night by  mild persistent chest discomfort and for this reason, agreed to let his wife bring him to the emergency room today.  The patient has also had increasing fatigue. He is not a diabetic but does have a history of hypertension and has been hypertensive for about the past 10 to 12 years.  FAMILY HISTORY:  Family history reveals that his father died at age 54 of heart  trouble.  Mother is living but takes some type of heart medication.  He has a brother who had a stent placed for coronary disease three years ago.  SOCIAL HISTORY:  He works at a [employer] cabin [employer].  He lifts heavy buckets.  He quit smoking 15 years ago.  He has not drunk any alcohol for four years.  He does not get any  regular exercise.  He is married and has one 45 year old son in good health.  ALLERGIES:  None known.  REVIEW OF SYSTEMS:  He takes occasional Prevacid for indigestion. He does not have any history of bleeding ulcers.  Genitourinary reveals a remote history of kidney stones but no recent dysuria or hematuria.  Respiratory history reveals occasional sinusitis.  MEDICATIONS: 1. Generic Tenex 2 mg at bedtime. 2. Trazodone 150 mg, taking a half tablet h.s. p.r.n. 3. Ambien 5 mg at h.s. p.r.n. 4. A daily aspirin. 5. He is also on hydrocodone APAP 5/500 occasionally for neck pain and also has    been on a 10-day course of prednisone for neck pain.  HOSPITALIZATIONS AND PAST MEDICAL HISTORY:  Patient was hospitalized in the past for appendectomy and also for removal of a pilonidal cyst.  PHYSICAL EXAMINATION:  VITAL SIGNS:  Blood pressure is 139/80.  Pulse is 90 and regular.  Respirations are normal.  HEENT:  Negative.  NECK:  There are no carotid bruits.  Jugular venous pressure normal.  Thyroid normal.  CHEST:  Clear.  HEART:  Quiet precordium, without murmur, gallop or rub.  ABDOMEN:  Soft and nontender.  EXTREMITIES:  No edema.  LABORATORY AND X-RAY FINDINGS:  His chest x-ray has no active disease.  His EKG is within normal limits.  Laboratory studies are pending.  DIAGNOSTIC IMPRESSION: 1. Chest pain consistent with unstable angina, rule out myocardial infarction. 2. Hypertensive cardiovascular disease. 3. Patient thinks that his cholesterol may have been slightly high in the past; we    will recheck that here.  DISPOSITION:  He is being admitted to telemetry.  He will be treated with IV nitroglycerin, IV heparin and also IV integrilin.  We will also treat him with aspirin and beta blocker.  Anticipate cardiac catheterization on July 05, 1999 by [facility]. DD:  07/04/99 TD:  07/04/99 Job: [id number]

## 2010-11-09 NOTE — Discharge Summary (Signed)
NAME:  Tyler Le, Tyler Le                          ACCOUNT NO.:  [id number]   MEDICAL RECORD NO.:  [id number]                   PATIENT TYPE:  IPS   LOCATION:  0305                                 FACILITY:  [facility]   PHYSICIAN:  Jeanice Lim, M.D.              DATE OF BIRTH:  07-Jul-1945   DATE OF ADMISSION:  09/08/2003  DATE OF DISCHARGE:  09/14/2003                                 DISCHARGE SUMMARY   IDENTIFYING DATA:  This is a 65 year old single Caucasian male presenting to  [facility] ER, had become depressed since November.  Was seen at the [place] in  [place], where Wellbutrin was added to already-prescribed Paxil.  He had  been on this for 2-3 days.  It was making him feel woozy and unsteady at  times.  He had been actively suicidal and homicidal prior to admission and  he was worried that he really would hurt his girlfriend or himself.  He  asked her to leave the house because of his concern and she then realized  that he may be serious and threatened to actually leave him unless he sought  hospitalization.  He had been treated with Elavil in 1966.  In 1998 was in  prison for selling hard drugs but reported that he was not using them.  Had  a history of bypass surgery and had been getting medications from a primary  care doctor in [place], Dr. Jeannetta Nap.   CURRENT MEDICATIONS:  Paxil, atenolol, nitroglycerin, isosorbide,  hydrocodone, Xanax and Restoril.   ALLERGIES:  No known drug allergies.   PHYSICAL EXAMINATION:  Essentially within normal limits.  Neurologically  nonfocal.   LABORATORY DATA:  Routine admission labs essentially within normal limits.  Urine drug screen was positive for opiates as expected with prescribed  hydrocodone.   ALCOHOL/DRUG HISTORY:  The patient admitted to a history of severe  alcoholism up until the age of 77.   MENTAL STATUS EXAM:  Well-developed, well-nourished, white male, who did not  appear to be in distress.  Cooperative.  Speech not  pressured.  Normal rate  and tone.  Mood mildly dysphoric to depressed.  Affect was slightly  restrained.  Thought processes goal directed.  No psychotic symptoms.  No  acute dangerous ideation reported.  However, prior to admission, reported  suicidal and homicidal thoughts.  Judgment and insight were fair.  Cognition  intact.   ADMISSION DIAGNOSES:   AXIS I:  Major depressive disorder with suicidal ideation and possible  homicidal ideation prior to admission.   AXIS II:  Deferred.   AXIS III:  1. Status post coronary artery bypass graft in 2001.  2. Kidney stone.  3. Treated for hypertension.  4. History of a shoulder injury, which he takes hydrocodone for the pain.   AXIS IV:  Disability, limited support systems.   AXIS V:  43/55-60.   HOSPITAL COURSE:  The patient was  admitted and ordered routine p.r.n.  medications and underwent further monitoring.  Was encouraged to participate  in individual, group and milieu therapy.  The patient was monitored for  safety and adjusted on medications.  Due to lack of response, Paxil was  tapered and Cymbalta started.  Fasting glucose, hemoglobin A1C and lipid  panel were ordered.  Geodon IM was given due to patient's agitation, which  the patient tolerated well.  Zyprexa was optimized.  A family meeting with  the girlfriend was requested and Paxil was discontinued.  The patient  tolerated medication changes, reported a positive response to medication  changes with improvement in depressive symptoms.   CONDITION ON DISCHARGE:  Improved.  Mood was less depressed.  Affect  brighter.  Thought processes goal directed.  Thought content negative for  dangerous ideation or psychotic symptoms.  The patient denied any dangerous  thoughts towards hurting himself or his girlfriend, feeling that he can cope  better now that he had received help.  He was discharged after medication  education with recommendations.   DISCHARGE MEDICATIONS:  1.  Continue Vicodin 5\500 every six hours p.r.n. pain.  To be prescribed     by primary care physician.  2. Nitrostat 0.4 mg to take as previously prescribed p.r.n. chest pain.  3. Xanax 0.5 mg t.i.d. p.r.n. anxiety.  4. Motrin 800 mg q.8h. p.r.n. pain.  5. Cymbalta 60 mg q.a.m.  6. Isordil 40 mg q.a.m.  7. Tenormin 50 mg at 6 p.m.  8. Zyprexa 5 mg at 12 p.m. and 15 mg q.h.s.  9. Restoril 15 mg, 2-3 q.h.s. p.r.n. insomnia.   FOLLOW UP:  The patient was to follow up with [facility] on Tuesday, September 20, 2003 at 9 a.m.   DISCHARGE DIAGNOSES:   AXIS I:  Major depressive disorder with suicidal ideation and possible  homicidal ideation prior to admission.   AXIS II:  Deferred.   AXIS III:  1. Status post coronary artery bypass graft in 2001.  2. Kidney stone.  3. Treated for hypertension.  4. History of a shoulder injury, which he takes hydrocodone for the pain.   AXIS IV:  Disability, limited support systems.   AXIS V:  Global Assessment of Functioning on discharge 55.                                               Jeanice Lim, M.D.   JEM/MEDQ  D:  10/23/2003  T:  10/24/2003  Job:  [id number]

## 2010-11-09 NOTE — H&P (Signed)
NAME:  Tyler Le, Tyler Le                          ACCOUNT NO.:  [id number]   MEDICAL RECORD NO.:  [id number]                   PATIENT TYPE:  IPS   LOCATION:  0305                                 FACILITY:  [facility]   PHYSICIAN:  Geoffery Lyons, M.D.                   DATE OF BIRTH:  1945/07/10   DATE OF ADMISSION:  09/08/2003  DATE OF DISCHARGE:                         PSYCHIATRIC ADMISSION ASSESSMENT   IDENTIFYING INFORMATION:  He is a voluntary admission.  This is a 65-year-  old single white male who presented to the [facility] Emergency Department  earlier today.  The patient reports having started becoming depressed this  past November.  He was seen at the [facility] in [place], where Wellbutrin was  added to his already prescribed Paxil.  He stated that he only takes the  Wellbutrin for two or three days.  He felt that made him woozy to the point  that he had to hug the walls so he quit taking that.  Currently, he has  become actively suicidal and homicidal.  He was worried that he would really  do something to hurt his girlfriend or himself and, hence, he commanded her  to leave the house.  Once she realized he was quite serious, she threatened  to actually leave him unless he sought hospitalization and, hence, he came  to the emergency room.  The patient was first treated with Elavil in 1996 to  1998 while imprisoned.  He was in prison for selling hard drugs.  He states  he did not use them, he just sold them and he underwent bypass surgery in  2001.  His cardiologist, Dr. Patty Sermons, has been prescribing medications as  well as his primary care doctor, Dr. Jeannetta Nap in [place].  Today, he  feels out of control.  He states he cannot sleep.  He wants to strike out.  He does not want to do anything.  He cannot stand to be around people.  All  of this is delivered with a non-pressured, very reasonable tone of voice.  His affect is somewhat flat and basically there is a mismatch between what  he reports and how he presents.  He is requesting that he not have a  roommate.  He is also noting that, every since his open-heart surgery or  actually he said since he was in prison, whenever he takes medicine, he  develops a taste in his mouth and a burning and he wondered if there was  some kind of medication that we could give him to prevent this.   PAST PSYCHIATRIC HISTORY:  He has had no prior psychiatric admissions and he  just began treatment at the [facility] in [place] in November.   SOCIAL HISTORY:  He obtained a GED.  He was in the [employer].  He has been  employed in a variety of jobs.  He has had no actual employment  since his  open-heart surgery and he does receive social security.   FAMILY HISTORY:  He did have a first cousin who successfully suicided by  shooting himself in the head and he has a sister who has been sent to [facility] a number of times.  He is not close to her and is not sure  what medications she is currently treated with.  He states that, from age 38  to age 37, he was a severe alcoholic.   PRIMARY CARE PHYSICIAN:  Dr. Jeannetta Nap in [place].   MEDICAL PROBLEMS:  He is treated for hypertension.  He is status post open-  heart surgery and he has also recently been diagnosed with kidney stones.   CURRENT MEDICATIONS:  Paxil, atenolol, nitroglycerin, isosorbide,  hydrocodone, Xanax, temazepam.   ALLERGIES:  He has no known drug allergies but he does have this unusual  drug reaction according to him.   PHYSICAL EXAMINATION:  Tall, well-developed, well-nourished white male in no  acute distress.  A complete physical was done in the ER.  It is not repeated  at this time.   LABORATORY DATA:  His urine drug screen was positive for opiates as would be  expected as he is prescribed hydrocodone.   MENTAL STATUS EXAM:  Well-developed, well-nourished, white male who does not  appear to be in the distress that he reports.  He is cooperative.  His   speech is not pressured.  He has a normal rate, rhythm and tone.  His mood  does not appear to be dysphoric to the degree that he is reporting.  His  affect shows some restraint in its expression and his thought processes are  clear and rational.  He is goal-oriented.  He wants a medicine that will not  produce these side effects in his mouth.  Judgment and insight are intact.  Concentration and memory are intact.  Intelligence is at least average.   DIAGNOSES:   AXIS I:  Major depressive disorder with suicidal ideation.   AXIS II:  Deferred.   AXIS III:  1. Status post coronary artery bypass graft in 2001.  2. Currently has kidney stone.  3. He is also treated for hypertension.  4. He is status post shoulder surgery on the left and takes hydrocodone for     the pain.   AXIS IV:  He is disabled.   AXIS V:  He is currently 71.   PLAN:  He will be admitted to provide safety and to address his medications  and to find a more effective antidepressant.  He is aware of the fact that  Paxil has been taken off the market and he is concerned about what we are  going to do about this.  He is also concerned about the fact that he has no  income.  [facility] because he wanted  his family to be able to visit him.   TENTATIVE LENGTH OF STAY:  Four to five days.     Mickie Leonarda Salon, P.A.-C.               Geoffery Lyons, M.D.    MD/MEDQ  D:  09/08/2003  T:  09/09/2003  Job:  [id number]

## 2010-11-09 NOTE — Op Note (Signed)
NAMETARIG, ZIMMERS NO.:  [id number]   MEDICAL RECORD NO.:  [id number]                   PATIENT TYPE:   LOCATION:                                       FACILITY:   PHYSICIAN:  Burnard Bunting, M.D.                 DATE OF BIRTH:  [birth year]   DATE OF PROCEDURE:  03/19/2002  DATE OF DISCHARGE:                                 OPERATIVE REPORT   PREOPERATIVE DIAGNOSIS:  Left frozen shoulder.   POSTOPERATIVE DIAGNOSIS:  Left frozen shoulder.   PROCEDURE:  Manipulation of left shoulder with diagnostic operative  arthroscopy, subacromial decompression and rotator interval release and  debridement.   SURGEON:  Burnard Bunting, M.D.   ANESTHESIA:  General endotracheal.   ESTIMATED BLOOD LOSS:  10 cc.   DRAINS:  None.   PROCEDURE IN DETAIL:  The patient was brought to the operating room where  general endotracheal anesthesia was induced.  Preoperative IV antibiotics  were administered.  The left shoulder was then manipulated into full forward  flexion, full adduction, and external rotation to 70 degrees.  At 15 and 90  degrees of adduction and 70 degrees of internal rotation and 90 degrees  abduction preoperative values were 50 degrees of forward flexion, 50 degrees  of abduction, 10 degrees of external rotation, 10 degrees of internal  rotation.  Following manipulation, the patient's left arm was prepped with  Duraprep solution and draped in a sterile manner.  Diagnostic arthroscopy  was performed.   ________ anatomy of the left shoulder was identified including the posterior  lateral and anterior margin of the acromions of the [facility] joint and coracoid  process.  The joint was insufflated with 22 cc. of saline.  A posterior  portal was created 2 cm. medial and inferior to the posterior lateral margin  of the acromion.  The scope was inserted into the glenohumeral joint.  The  anterior portal was created under direct visualization with a spinal  needle.  The joint was irrigated.  The rotator cuff was intact.  The glenohumeral  articular surfaces were intact and no loose bodies were seen in the axillary  recess.  The patient had some fraying and synovitis in the superior labral  region.  This was debrided.  Biceps anchor was stable.  Rotator interval was  not fully released.  The scope was then placed into the subacromial space.  Lateral portal was established.  A bursectomy and subacromial decompression  was performed, with release, but not resection of the CA ligament and  shaving a small bone spur on the anterior lateral margin of the acromion.  The joint was irrigated.  Instruments were removed.  Portals were closed  used 3-0 nylon suture.  Bulky shoulder dressing was placed.  The patient  will undergo intensive physical therapy and CT on postop.  He tolerated the  procedure well without complications.  The patient was transferred to the  recovery room in stable condition.                                               Burnard Bunting, M.D.   GSD/MEDQ  D:  05/18/2002  T:  05/18/2002  Job:  [id number]

## 2010-11-09 NOTE — Consult Note (Signed)
[place]. [facility]  Patient:    Tyler Le                   MRN: [id number] Proc. Date: 07/05/99 Adm. Date:  [id number] Attending:  Rudean Hitt Dictator:   [id number] CC:         CVTS office             Alvia Grove., M.D.                          Consultation Report  REASON FOR CONSULTATION:  Class IV unstable angina with severe three-vessel coronary artery disease.  HISTORY OF PRESENT ILLNESS:  The patient is a 65 year old white male with history of hypertension and progressive chest pain which culminated in a class IV arrest angina for which he sought attention at the emergency room.  He was evaluated by Dr. Patty Sermons in the emergency room on July 04, 1999 and admitted for unstable angina, rule out myocardial infarction.  He was stabilized on nitroglycerin, heparin and IV Integrilin.  His EKG had no acute change.  His cardiac enzymes were negative.  His cardiac catheterization by Dr. Kristeen Miss demonstrated severe  three-vessel coronary disease with total occlusion of the right, 90% stenosis of the proximal left anterior descending artery, 90% stenosis of the circumflex marginal, and 80% stenosis of the diagonal.  His overall ejection fraction was 0 50 to 55% and he is referred for coronary revascularization.  The patient states that he has had some exertional chest pain since around Christmas time and no clinical history of a myocardial infarction.  He denies any symptoms of congestive heart failure, but he has had increasing fatigue and decreased exercise tolerance over the past several weeks.  PAST MEDICAL HISTORY:  The patient has a history of hypertension.  His primary are physician is Dr. Shelah Lewandowsky.  He has had previous appendectomy and pilonidal cyst removal.  CURRENT MEDICATIONS: 1. Trazodone 150 mg p.o. q.d. 2. Ambien 5 mg p.o. q. h.s. 3. Hydrocodone p.r.n. for neck pain.  ALLERGIES:  He denies  allergies to medications.  FAMILY HISTORY:  The patients father died of a myocardial infarction at age 57.  The patients mother has heart disease, probably coronary artery disease.  A brother had a coronary stent placement three years previously.  SOCIAL HISTORY:  The patient works at a [employer] in [place] and does physical work.  He is married and has a son.  He denies smoking and denies  alcohol intake currently.  REVIEW OF SYSTEMS:  He denies any recent change in weight, fever, or night sweats. He denies any TIA or CVA symptoms.  He denies any DVT or claudication.  He denies any significant trauma or fractures.  He denies any recent change in his vision or hearing.  There is no history of blood per rectum, abdominal pain or jaundice. He does take an occasional Prevacid for indigestion.  There is a history of remote  kidney stone but no recent polyuria, dysuria or hematuria.  He denies any skin ash or skin lesion.  He denies any bleeding, diaphysis or easy bruisability.  He denies any history of depression, insomnia or decreased appetite.  PHYSICAL EXAMINATION:  GENERAL:  He is 6 feet 3 inches tall and weighs 260 pounds.  General appearance is that of a middle-aged white male in no acute distress in his hospital room following cardiac catheterization.  VITAL  SIGNS:  His blood pressure is 140/70, pulse is 70 in sinus.  HEENT:  Exam is normocephalic with normal EOMs and dentition in good repair.  NECK:  Supple without JVD, thyromegaly or mass.  There is no carotid bruit.  LUNGS:  Clear to auscultation and there is no thoracic deformity.  CARDIAC:  Exam is with a regular rate and rhythm, normal S1 and S2. No S3, no murmur.  ABDOMEN:  Soft, nontender without mass or organomegaly.  EXTREMITIES:  There are 2+ pulses in the pedal area and both radials.  There is no venous insufficiency and no edema.   He has not tender or swollen joints.  SKIN:  Clear  without rash or lesion.  NEUROLOGICAL EXAM:  Alert and oriented x 3 with full motor function.  LABORATORY DATA:  His white count is 6900, hematocrit 46%, platelet count 161,000. His BUN is 19 and creatinine 1.3.  His CPK-MB was 1.0.  His coronary arteriograms are reviewed and he has proximal 90% LAD, an 80%  diagonal, a total occlusion of right coronary with reconstitution of the distal  vessel and a 90% circumflex marginal.  IMPRESSION:  Severe three-vessel coronary artery disease with class IV unstable  angina.  Coronary artery bypass grafting would benefit the patient in terms of relief of symptoms, preservation of ventricular function and improved survival. We  will plan mammary artery grafting to the left anterior descending artery and vein grafts to the diagonal, obtuse marginal and distal right coronary artery.  The operation benefits and risks were discussed with the patient and his family and he agrees to proceed with the operation which will be scheduled for Monday, July 09, 1999.                             w DD:  07/05/99 TD:  07/05/99 Job: [id number]

## 2010-11-09 NOTE — Discharge Summary (Signed)
[place]. [facility]  Patient:    Tyler Le, Tyler Le Visit Number: [id number] MRN: [id number]          Service Type: MED Location: [phone] Attending Physician:  Rudean Hitt Dictated by:   Clovis Pu Patty Sermons, M.D. Admit Date:  08/20/2001 Discharge Date: 08/22/2001   CC:         Buren Kos, M.D.   Discharge Summary  FINAL DIAGNOSES: 1. Chest pain. 2. Angina pectoris. 3. Coronary atherosclerosis. 4. Status post coronary artery bypass grafting.  OPERATIONS PERFORMED:  Left heart cardiac catheterization on August 21, 2001 by Dr. Vesta Mixer, Montez Hageman.  HISTORY:  This 65 year old gentleman was admitted with chest pain.  He has a history of known coronary artery disease, and he has had previous CABG x5 in January 2001 by Mikey Bussing, M.D.  Postoperatively he has continued to complain of chest discomfort, but no one has been able to determine exactly what is the cause of discomfort was.  It was felt that stress could be playing a role.  The patient is now on disability.  On the day of admission he had eaten a light lunch and then noted severe chest pressure, with left arm radiation.  The pain reminded him of his symptoms pre-bypass.  Associated with this discomfort was diaphoresis and mild nausea, but no vomiting.  Past medical history reveals that he was treated several weeks earlier with topical lotion for what was felt to be herpes Zoster.  PHYSICAL EXAMINATION (On admission):  VITAL SIGNS:  Blood pressure 161/93, pulse 80, respirations normal.  LUNGS:  Clear.  HEART:  Revealed no murmurs, gallops or rubs.  ABDOMEN:  Negative.  EXTREMITIES:  Good pulses and no edema.  ELECTROCARDIOGRAM:  Shows incomplete right bundle branch block, with no acute change.  CHEST X-RAY:  Normal.  LABS:  Normal.  IMPRESSION:  It appears that the pain was lacking an acute coronary syndrome. He was started on an IV nitroglycerin  and IV heparin, and serial enzymes were obtained.  It was felt that he would need cardiac catheterization. Alvia Grove., M.D. saw the patient with that in mind.  He agreed with the indications for cardiac catheterization, and this was carried out on August 21, 2001.  He was found to have severe native coronary disease.  The graft to the diagonal anastomosis was actually prior to a tight stenosis.  It was felt that the lesions found were not amenable to percutaneous intervention.  It was felt that medical management would be our best option.  We added Imdur 30 mg once q.d., and also considered adding EECP therapy.  DISCHARGE MEDICATIONS: 1. Enteric-coated aspirin 325 mg one q.d. 2. Cozaar 50 mg q.d. 3. Tenormin 50 mg q.d. 4. Nitrostat p.r.n. 5. Imdur 30 mg q.d. 6. Xanax p.r.n.  DIET:  Low-salt, low-cholesterol.  SPECIAL INSTRUCTIONS:  He is to watch for any groin bleeding.  FOLLOW-UP:  Clovis Pu. Brackbill, M.D. in one to two weeks.  At that point we could consider EECP if he is still having intractable chest pain.  CONDITION ON DISCHARGE:  Improved. Dictated by:   Clovis Pu Patty Sermons, M.D. Attending Physician:  Rudean Hitt DD:  09/05/01 TD:  09/07/01 Job: [id number]

## 2010-11-09 NOTE — Cardiovascular Report (Signed)
[place]. [facility]  Patient:    DECOREY, WAHLERT                         MRN: [id number] Proc. Date: 07/05/99 Attending:  Alvia Grove., M.D. CC:         [name], M.D.             Thomas A. Patty Sermons, M.D.             CVTS Office                        Cardiac Catheterization  HISTORY:  Mr. Enis is a 65 year old gentleman with a history of hypertension. He has had chest pain for the past month or so.  He has a strong family history of  coronary artery disease.  He was referred after developing symptoms consistent ith unstable angina.  ACCESS:  The femoral artery was easily cannulated using a modified Seldinger technique.  HEMODYNAMICS: 1. Left ventricular pressure:  124/22 2. Aortic pressure:  124/79.  ANGIOGRAPHY: 1. The left main coronary artery is relatively smooth and normal. 2. The left anterior descending artery is relatively normal in its proximal    segment.  The mid segment is severely diseased, between 50-60%.  There is an    ulcerated plaque in the middle of this stenosis.  The stenosis then gets    somewhat more severe -- approximately 70%.  The mid and distal LAD are diffusely    diseased.  There is a 50% blockage in the mid segment.  The mid segment of the    LAD is heavily calcified.  The first diagonal vessel is a fairly large vessel.    There is a 70% stenosis proximally.  The second diagonal branch is a relatively    small vessel, which divides into two even smaller branches.  There is a    significant stenosis in the proximal aspect of this branch (about 99%). This    stenosis extends at each of the smaller branches.  These branches should be    suitable for bypass grafting. 3. The left circumflex artery is a relatively large vessel.  It is relatively    normal in the proximal segment.  It gives a large and branching first obtuse    marginal artery.  This vessel is subtotally occluded at the  bifurcation. The    left circumflex artery continues around the AV groove, and supplies a    posterolateral segment artery.  This vessel is fairly unremarkable. 4. The right coronary artery is a large and dominant vessel.  It is occluded    proximally.  The mid and distal vessels fill via left to right collaterals. 5. The left subclavian artery is normal.  The left internal mammary artery is    normal.  LEFT VENTRICULOGRAM:  Performed in a 30-degree RAO position.   It reveals an overall normal left ventricular systolic function.  The ejection fraction is approximately 55-60%.  There is no mitral regurgitation.  COMPLICATIONS:  None.  CONCLUSIONS: 1. Severe three-vessel coronary artery disease. 2. Well-preserved left ventricular systolic function.  SUMMARY:  Given these patient symptoms and his extensive coronary artery disease, I would favor coronary artery bypass grafting.  We will consult with CVTS. DD: 07/05/99 TD:  07/05/99 Job: [id number]

## 2010-11-09 NOTE — Op Note (Signed)
[facility]  Patient:    Tyler Le                   MRN: [id number] Proc. Date: 07/09/99 Adm. Date:  [id number] Attending:  Mikey Bussing CC:         CVTS office             Alvia Grove., M.D.                           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  OPERATION PERFORMED:  Coronary artery bypass grafting x 5 (left internal mammary artery to the left anterior descending, saphenous vein graft to diagonal, sequential saphenous vein graft to ramus and obtuse marginal, saphenous vein graft to right coronary artery).  SURGEON:  Mikey Bussing, M.D.  ASSISTANT:  Loura Pardon, P.A.  ANESTHESIA:  General.  ANESTHESIOLOGIST:  Burna Forts, M.D.  INDICATIONS FOR PROCEDURE:  The patient is a 65 year old white male with recent  onset of chest pain and admission for rule out myocardial infarction.  Cardiac catheterization by Dr. Elease Hashimoto demonstrated severe three-vessel coronary artery disease and he was referred for coronary revascularization.  Prior to the operation, the patient was examined in his hospital room and the results of the cardiac catheterization were discussed with the patient and wife. The indications and expected benefits of coronary artery bypass grafting were reviewed.  The details of the operation were explained to the patient including the placement of the surgical incisions, the use of cardiopulmonary bypass and general anesthesia and the choice of conduit.  I reveiwed the potential risks of the operation as well including the risks of MI, CVA, bleeding, infection, and death. He understood these implications for surgery and agreed to proceed with the operation as planned under informed consent.  OPERATIVE  FINDINGS:  The mammary artery was of good quality.  The saphenous vein was of average quality.  The coronaries were adequate targets for grafting.  DESCRIPTION OF PROCEDURE: The patient was brought to the operating room and placed supine on the operating table where general anesthesia was induced under invasive hemodynamic monitoring.  The chest, abdomen and legs were prepped with Betadine and draped as a sterile field.  A median sternotomy was performed as the saphenous ein was harvested from the right leg.  The left internal mammary artery was harvested as a pedicle graft from its origin at the subclavian vessels and was a good vessel with excellent flow.  Heparin was administered systemically and the ACT was documented as being therapeutic.  The sternal retractor was placed and through pursestrings placed in the ascending aorta and right atrium, the patient was cannulated and placed on cardiopulmonary bypass and cooled to 32 degrees.  The coronary arteries were identified and the mammary artery and vein grafts were prepared for the distal anastomoses.  A cardioplegia cannula was placed.  As the aortic crossclamp was applied, 500 cc of cold blood cardioplegia was delivered o the aortic root with immediate cardioplegic arrest and septal temperature dropping less than 12 degrees.  Topical iced saline slush was used to augment myocardial  preservation and a percardial insulator pad was used to protect the left phrenic nerve.  The distal coronary anastomoses were then performed.  The first  distal anastomosis was to the distal right coronary artery.  This was totally occluded proximally. A reversed saphenous vein was sewn end-to-side to this 1.8 mm vessel with running 7-0 Prolene.  There was good flow through the graft.  The second distal anastomosis was to the diagonal.  This was a 1.5 mm vessel with proximal 90% stenosis.  A reversed saphenous vein was sewn end-to-side with  running 7-0 Prolene to this 1.5 mm vessel with good flow through the graft.  The third and fourth distal anastomoses consisted of a sequential vein graft to the ramus intermediate continuing to the obtuse marginal.  The ramus intermediate was a 2.0 mm vessel intramyocardial and the vein was sewn side-to-side with running 7-0 Prolene.  There was good flow through the graft.  The fourth distal anastomosis was the continuation of the obtuse marginal which is a 1.8 mm vessel with proximal 90% stenosis.  The end of the vein was sewn end-to-side with running 7-0 Prolene and there was good flow through the graft.  Cardioplegia was then redosed.  The fifth distal anastomosis was to the distal third of the LAD which was a 1.5 mm vessel with proximal 90% stenosis.  The left internal mammary artery pedicle was brought through an opening created in the left lateral pericardium and was brought down on the LAD and sewn end-to-side with running 8-0 Prolene.  There was excellent flow through the anastomosis with immediate rise in septal temperature after a release of the pedicle clamp on the mammary artery.  The mammary pedicle was secured to the epicardium and the aortic crossclamp was removed.  The heart resumed a spontaneous rhythm.  Three proximal vein anastomoses were placed using a partial occlusion clamp.  All anastomoses were inspected and found to be hemostatic and the grafts had excellent flow.  The patient was rewarmed to 37 degrees and the ventilator was reinitiated and the lungs expanded.  The heart was filled and the patient weaned from cardiopulmonary bypass without inotropes with stable blood pressure and hemodynamics.  Protamine was administered and the cannulas were removed.  The mediastinum was irrigated with warm antibiotic irrigation.  The leg incision was irrigated and closed in a standard fashion. he pericardium was loosely reapproximated.  Two mediastinal and a left  pleural chest tubes were placed and brought out through separate incisions.  The sternum was reapd with interrupted steel wires.  The pectoralis fascia and subcutaneous layers  closed with running Vicryl.  The skin was closed with a subcuticular and sterile dressings were applied.  Total cardiopulmonary bypass time was 128 minutes with  aortic crossclamp time of 60 minutes. DD:  07/09/99 TD:  07/09/99 Job: [id number]

## 2010-11-29 ENCOUNTER — Other Ambulatory Visit: Payer: Self-pay | Admitting: Cardiology

## 2010-11-29 MED ORDER — AMLODIPINE BESYLATE 5 MG PO TABS: 5.0000 mg | ORAL_TABLET | Freq: Every day | ORAL | Status: DC

## 2010-11-29 NOTE — Telephone Encounter (Signed)
Med refill

## 2011-01-15 ENCOUNTER — Encounter: Payer: Self-pay | Admitting: Nurse Practitioner

## 2011-01-15 ENCOUNTER — Ambulatory Visit (INDEPENDENT_AMBULATORY_CARE_PROVIDER_SITE_OTHER): Payer: Medicare Other | Admitting: Nurse Practitioner

## 2011-01-15 DIAGNOSIS — R072 Precordial pain: Secondary | ICD-10-CM

## 2011-01-15 DIAGNOSIS — R079 Chest pain, unspecified: Secondary | ICD-10-CM

## 2011-01-15 DIAGNOSIS — I2510 Atherosclerotic heart disease of native coronary artery without angina pectoris: Secondary | ICD-10-CM

## 2011-01-15 HISTORY — DX: Atherosclerotic heart disease of native coronary artery without angina pectoris: I25.10

## 2011-01-15 MED ORDER — ISOSORBIDE MONONITRATE ER 30 MG PO TB24: ORAL_TABLET | ORAL | Status: DC

## 2011-01-15 NOTE — Assessment & Plan Note (Signed)
Prior history of CABG in 2001. S/P BMS to the SVG to the intermediate/OM back in March. He is off his Effient. He continues to have some exertional chest pain that is responsive to NTG. He is willing to increase the Imdur to a whole tablet. I will see him back in about one month. He is wanting a clearance for back surgery and I will discuss with Dr. Patty Sermons. He is not wanting to proceed until the fall. This will give [place] some time to fine tune his medicines. Patient is agreeable to this plan and will call if any problems develop in the interim.

## 2011-01-15 NOTE — Patient Instructions (Signed)
Lets try taking the Isosorbide a whole tablet at night I will see you in one month Call for any problems

## 2011-01-15 NOTE — Progress Notes (Signed)
Tyler Le Date of Birth: 07-Oct-1945   History of Present Illness: Tyler Le is seen back today for a 3 month check. He is seen for Dr. Patty Sermons. He had a bare metal stent placed in a SVG to the intermediate/OM back in March. He was on Effient for 60 days. He had been planning on having back surgery with Dr. Otelia Sergeant but that plan was put on hold. He got a little better from the standpoint of his back pain, but now it is bothering him again. He would like to have surgery after the weather gets cooler. He continues to have some exertional chest pain with walking. He uses NTG rather frequently with good response. He admits to only taking half dose of his Isosorbide due to headache. He has chronic DOE that is unchanged. Blood pressure is 140/90 at home. No medicines taken today.   Current Outpatient Prescriptions on File Prior to Visit  Medication Sig Dispense Refill  . ALPRAZolam (XANAX) 0.5 MG tablet Take 0.5 mg by mouth 3 (three) times daily as needed.        Marland Kitchen amLODipine (NORVASC) 5 MG tablet Take 1 tablet (5 mg total) by mouth daily.  90 tablet  3  . aspirin 325 MG tablet Take 325 mg by mouth daily.        . carvedilol (COREG) 25 MG tablet Take 25 mg by mouth 2 (two) times daily with a meal.        . ergocalciferol (VITAMIN D2) 50000 UNITS capsule Take 3,000 Units by mouth daily.        Marland Kitchen guaiFENesin (MUCINEX) 600 MG 12 hr tablet Take 1,200 mg by mouth as needed.       . insulin glargine (LANTUS) 100 UNIT/ML injection Inject 40 Units into the skin 2 (two) times daily.       Marland Kitchen lisinopril (PRINIVIL,ZESTRIL) 10 MG tablet Take 10 mg by mouth daily.        . Multiple Vitamin (MULTIVITAMIN) tablet Take 1 tablet by mouth daily.        . pramlintide (SYMLIN) 600 MCG/ML injection Inject 60 mcg into the skin 2 (two) times daily.        . rosuvastatin (CRESTOR) 10 MG tablet Take 10 mg by mouth daily.        . sertraline (ZOLOFT) 100 MG tablet Take 100 mg by mouth daily.        . simvastatin (ZOCOR)  20 MG tablet Take 20 mg by mouth at bedtime.        Marland Kitchen zolpidem (AMBIEN) 10 MG tablet Take 10 mg by mouth at bedtime as needed.        Marland Kitchen DISCONTD: isosorbide mononitrate (IMDUR) 30 MG 24 hr tablet Take 1 tablet (30 mg total) by mouth daily.  90 tablet  3    Allergies  Allergen Reactions  . Altace   . Metformin And Related   . Pravachol   . Wellbutrin (Bupropion Hcl)     Past Medical History  Diagnosis Date  . Hypertension   . Chronic kidney disease     nephrolithiasis  . Coronary artery disease   . S/P CABG (coronary artery bypass graft) 2001  . Status post coronary artery stent placement March 2012    BMS to SVG to intermediate and OM  . Dyslipidemia   . Obesities, morbid   . Insulin dependent diabetes mellitus   . Back pain     Past Surgical History  Procedure Date  .  Appendectomy   . Coronary stent placement March 2012    BMS to SVG to intermediate/OM  . Coronary artery bypass graft 2001    History  Smoking status  . Former Smoker  . Quit date: 06/24/1988  Smokeless tobacco  . Not on file    History  Alcohol Use No    Family History  Problem Relation Age of Onset  . Heart disease Mother   . Heart disease Father   . Heart attack Father   . Heart disease Brother     Review of Systems: The review of systems is as above.  All other systems were reviewed and are negative.  Physical Exam: BP 154/102  Pulse 87  Ht 6\' 4"  (1.93 m)  Wt 279 lb 9.6 oz (126.826 kg)  BMI 34.03 kg/m2 Patient is pleasant and in no acute distress. He is obese. Skin is warm and dry. Color is normal.  HEENT is unremarkable. Normocephalic/atraumatic. PERRL. Sclera are nonicteric. Neck is supple. No masses. No JVD. Lungs are clear. Cardiac exam shows a regular rate and rhythm. Abdomen is obese and soft. Extremities are without edema. Gait and ROM are intact. No gross neurologic deficits noted.  LABORATORY DATA: EKG shows sinus with RSR' and ST and T wave changes.   Assessment /  Plan:

## 2011-01-23 HISTORY — PX: LUMBAR LAMINECTOMY: SHX95

## 2011-01-29 ENCOUNTER — Telehealth: Payer: Self-pay | Admitting: Cardiology

## 2011-01-29 NOTE — Telephone Encounter (Signed)
Pt's wife states pt is hurting, scheduled for back surgery and had stints put in heart previously in March, back hurting so bad he can hardly get around, wants to know if Dr. Patty Sermons can OK him for surgery, pt wife would like to have nurse call her back

## 2011-01-29 NOTE — Telephone Encounter (Signed)
Chart in Box

## 2011-01-29 NOTE — Telephone Encounter (Signed)
Scheduled appointment with  Dr. Brackbill  

## 2011-01-30 ENCOUNTER — Ambulatory Visit (INDEPENDENT_AMBULATORY_CARE_PROVIDER_SITE_OTHER): Payer: Medicare Other | Admitting: Cardiology

## 2011-01-30 ENCOUNTER — Encounter: Payer: Self-pay | Admitting: Cardiology

## 2011-01-30 VITALS — BP 118/80 | HR 80 | Wt 277.0 lb

## 2011-01-30 DIAGNOSIS — E119 Type 2 diabetes mellitus without complications: Secondary | ICD-10-CM

## 2011-01-30 DIAGNOSIS — M545 Low back pain, unspecified: Secondary | ICD-10-CM

## 2011-01-30 DIAGNOSIS — I209 Angina pectoris, unspecified: Secondary | ICD-10-CM

## 2011-01-30 DIAGNOSIS — E785 Hyperlipidemia, unspecified: Secondary | ICD-10-CM

## 2011-01-30 NOTE — Progress Notes (Signed)
Tyler Le Date of Birth:  02/13/1946 [facility] / [facility] [address]   [address] [place], [place]  [address] [phone]           Fax   [phone]  History of Present Illness: This pleasant 65 year old gentleman is seen for a scheduled followup office visit.  He has a history of known ischemic heart disease.  He underwent coronary artery bypass graft surgery in 2001.  In March of 2012 he underwent bare-metal stent placement to the saphenous vein graft to the intermediate and obtuse marginal since then he has been doing well.  He remained on Effient for 2 months following his intervention.He has not been expressing any recent chest pain.  He does have a history of essential hypertension.  He also has a history of diabetes and his hemoglobin A1c in 2011 was as high as 10.7.  [facility]  Recently his activity has been limited because of ongoing severe back pain.  He had been scheduled previously for back surgery which had to be postponed after he had the cardiac catheter with a bare metal stent and Effient therapy in March 2012  Current Outpatient Prescriptions  Medication Sig Dispense Refill  . ALPRAZolam (XANAX) 0.5 MG tablet Take 0.5 mg by mouth 3 (three) times daily as needed.        Marland Kitchen amLODipine (NORVASC) 5 MG tablet Take 1 tablet (5 mg total) by mouth daily.  90 tablet  3  . aspirin 325 MG tablet Take 325 mg by mouth daily.        . carvedilol (COREG) 25 MG tablet Take 25 mg by mouth 2 (two) times daily with a meal.        . ergocalciferol (VITAMIN D2) 50000 UNITS capsule Take 2,000 Units by mouth daily.       Marland Kitchen guaiFENesin (MUCINEX) 600 MG 12 hr tablet Take 1,200 mg by mouth as needed.       . insulin glargine (LANTUS) 100 UNIT/ML injection Inject 40 Units into the skin 2 (two) times daily.       . isosorbide mononitrate (IMDUR) 30 MG 24 hr tablet Take whole tablet daily at night  30 tablet  3  . lisinopril  (PRINIVIL,ZESTRIL) 10 MG tablet Take 10 mg by mouth daily.        . Multiple Vitamin (MULTIVITAMIN) tablet Take 1 tablet by mouth daily.        . Omega-3 Fatty Acids (FISH OIL) 1000 MG CPDR Take by mouth.        . pramlintide (SYMLIN) 600 MCG/ML injection Inject 60 mcg into the skin 2 (two) times daily.        . rosuvastatin (CRESTOR) 10 MG tablet Take 10 mg by mouth daily.        . simvastatin (ZOCOR) 20 MG tablet Take 20 mg by mouth at bedtime.        Marland Kitchen zolpidem (AMBIEN) 10 MG tablet Take 10 mg by mouth at bedtime as needed.        Marland Kitchen DISCONTD: isosorbide mononitrate (IMDUR) 30 MG 24 hr tablet Take 1 tablet (30 mg total) by mouth daily.  90 tablet  3    Allergies  Allergen Reactions  . Altace   . Metformin And Related   . Pravachol   . Wellbutrin (Bupropion Hcl)     Patient Active Problem List  Diagnoses  . S/P CABG (coronary artery bypass graft)  .  Angina pectoris  . Diabetes mellitus  . Dyslipidemia  . Low back pain  . Anxiety  . Benign hypertensive heart disease without heart failure  . CAD (coronary artery disease)    History  Smoking status  . Former Smoker  . Quit date: 06/24/1988  Smokeless tobacco  . Not on file    History  Alcohol Use No    Family History  Problem Relation Age of Onset  . Heart disease Mother   . Heart disease Father   . Heart attack Father   . Heart disease Brother     Review of Systems: Constitutional: no fever chills diaphoresis or fatigue or change in weight.  Head and neck: no hearing loss, no epistaxis, no photophobia or visual disturbance. Respiratory: No cough, shortness of breath or wheezing. Cardiovascular: No chest pain peripheral edema, palpitations. Gastrointestinal: No abdominal distention, no abdominal pain, no change in bowel habits hematochezia or melena. Genitourinary: No dysuria, no frequency, no urgency, no nocturia. Musculoskeletal:No arthralgias, no back pain, no gait disturbance or myalgias. Neurological: No  dizziness, no headaches, no numbness, no seizures, no syncope, no weakness, no tremors. Hematologic: No lymphadenopathy, no easy bruising. Psychiatric: No confusion, no hallucinations, no sleep disturbance.    Physical Exam: Filed Vitals:   01/30/11 1044  BP: 118/80  Pulse: 80  The general appearance reveals a well-developed well-nourished gentleman in no distress.  Normal head and neck.  The pupils are equal and reactive.  The sclerae are nonicteric.  Mouth and pharynx are normal.  Carotids normal.  Jugular venous pressure normal.  Thyroid not enlarged.The chest is clear to percussion and auscultation. There are no rales or rhonchi. Expansion of the chest is symmetrical.  The precordium is quiet.  The first heart sound is normal.  The second heart sound is physiologically split.  There is no murmur gallop rub or click.  There is no abnormal lift or heave.  The abdomen is soft and nontender. Bowel sounds are normal. The liver and spleen are not enlarged. There Are no abdominal masses. There are no bruits.  The pedal pulses are good.  There is no phlebitis or edema.  There is no cyanosis or clubbing.   The skin is warm and dry.  There is no rash.   Musculoskeletal reveals diminished mobility secondary to ongoing low back pain.   Assessment / Plan: We will recommend proceeding with back surgery.  Continue present medication.Recheck here in 4 months for office visit and fasting lab work

## 2011-01-30 NOTE — Assessment & Plan Note (Signed)
The patient has not been able to lose weight.  Some of this is due to inactivity because of his back pain.  He is not having any hypoglycemic reactions from his diabetes.

## 2011-01-30 NOTE — Assessment & Plan Note (Signed)
Patient has not had any recent chest pain or angina.

## 2011-01-30 NOTE — Assessment & Plan Note (Signed)
The patient continues to have severe low back pain.  He is far enough out from his percutaneous coronary intervention that we can proceed with back surgery with Dr. Otelia Sergeant whenever It can be rescheduled

## 2011-02-14 ENCOUNTER — Telehealth: Payer: Self-pay | Admitting: Nurse Practitioner

## 2011-02-14 ENCOUNTER — Encounter (HOSPITAL_COMMUNITY)
Admission: RE | Admit: 2011-02-14 | Discharge: 2011-02-14 | Disposition: A | Discharge status: Home / Self Care | Payer: Medicare Other | Source: Ambulatory Visit | Attending: Specialist | Admitting: Specialist

## 2011-02-14 LAB — COMPREHENSIVE METABOLIC PANEL
ALT: 22 U/L (ref 0–53)
Alkaline Phosphatase: 142 U/L — ABNORMAL HIGH (ref 39–117)
BUN: 14 mg/dL (ref 6–23)
CO2: 27 mEq/L (ref 19–32)
GFR calc Af Amer: >60 mL/min (ref >60)
GFR calc non Af Amer: >60 mL/min (ref >60)
Glucose, Bld: 282 mg/dL — ABNORMAL HIGH (ref 70–99)
Potassium: 4.7 mEq/L (ref 3.5–5.1)
Total Bilirubin: 0.7 mg/dL (ref 0.3–1.2)
Total Protein: 7.2 g/dL (ref 6.0–8.3)

## 2011-02-14 LAB — URINALYSIS, ROUTINE W REFLEX MICROSCOPIC
Bilirubin Urine: NEGATIVE
Nitrite: NEGATIVE
Protein, ur: NEGATIVE mg/dL
Specific Gravity, Urine: 1.026 (ref 1.005–1.030)
Urobilinogen, UA: 0.2 mg/dL (ref 0.0–1.0)

## 2011-02-14 LAB — DIFFERENTIAL
Basophils Absolute: 0 10*3/uL (ref 0.0–0.1)
Eosinophils Absolute: 0.2 10*3/uL (ref 0.0–0.7)
Eosinophils Relative: 4 % (ref 0–5)
Monocytes Absolute: 0.4 10*3/uL (ref 0.1–1.0)

## 2011-02-14 LAB — CBC
MCHC: 35.4 g/dL (ref 30.0–36.0)
MCV: 85 fL (ref 78.0–100.0)
Platelets: 116 10*3/uL — ABNORMAL LOW (ref 150–400)
RDW: 12.7 % (ref 11.5–15.5)
WBC: 5.5 10*3/uL (ref 4.0–10.5)

## 2011-02-14 LAB — URINE MICROSCOPIC-ADD ON

## 2011-02-14 LAB — SURGICAL PCR SCREEN: MRSA, PCR: NEGATIVE

## 2011-02-14 NOTE — Telephone Encounter (Signed)
All faxed today from previous note.

## 2011-02-14 NOTE — Telephone Encounter (Signed)
Tyler Le from [facility] requesting records last ekg, echo, stress, ov faxed to [phone]

## 2011-02-14 NOTE — Telephone Encounter (Signed)
Faxed Last 2 OV Notes, EKG, and Stress to [phone].  No Echo available

## 2011-02-15 ENCOUNTER — Ambulatory Visit: Payer: Medicare Other | Admitting: Nurse Practitioner

## 2011-02-18 ENCOUNTER — Inpatient Hospital Stay (HOSPITAL_COMMUNITY): Payer: Medicare Other

## 2011-02-18 ENCOUNTER — Inpatient Hospital Stay (HOSPITAL_COMMUNITY)
Admission: RE | Admit: 2011-02-18 | Discharge: 2011-02-20 | DRG: 490 | Disposition: A | Discharge status: Home Health | Payer: Medicare Other | Source: Ambulatory Visit | Attending: Specialist | Admitting: Specialist

## 2011-02-18 DIAGNOSIS — Y9210 Unspecified residential institution as the place of occurrence of the external cause: Secondary | ICD-10-CM | POA: Diagnosis present

## 2011-02-18 DIAGNOSIS — M48061 Spinal stenosis, lumbar region without neurogenic claudication: Principal | ICD-10-CM | POA: Diagnosis present

## 2011-02-18 DIAGNOSIS — IMO0002 Reserved for concepts with insufficient information to code with codable children: Secondary | ICD-10-CM | POA: Diagnosis present

## 2011-02-18 DIAGNOSIS — D62 Acute posthemorrhagic anemia: Secondary | ICD-10-CM | POA: Diagnosis not present

## 2011-02-18 DIAGNOSIS — G9741 Accidental puncture or laceration of dura during a procedure: Secondary | ICD-10-CM | POA: Diagnosis not present

## 2011-02-18 DIAGNOSIS — Z01812 Encounter for preprocedural laboratory examination: Secondary | ICD-10-CM

## 2011-02-18 DIAGNOSIS — E119 Type 2 diabetes mellitus without complications: Secondary | ICD-10-CM | POA: Diagnosis present

## 2011-02-18 LAB — GLUCOSE, CAPILLARY: Glucose-Capillary: 266 mg/dL — ABNORMAL HIGH (ref 70–99)

## 2011-02-19 LAB — BASIC METABOLIC PANEL
CO2: 25 mEq/L (ref 19–32)
Glucose, Bld: 268 mg/dL — ABNORMAL HIGH (ref 70–99)
Potassium: 4.5 mEq/L (ref 3.5–5.1)
Sodium: 136 mEq/L (ref 135–145)

## 2011-02-19 LAB — HEMOGLOBIN A1C
Hgb A1c MFr Bld: 10.1 % — ABNORMAL HIGH (ref <5.7)
Mean Plasma Glucose: 243 mg/dL — ABNORMAL HIGH (ref <117)

## 2011-02-19 LAB — GLUCOSE, CAPILLARY
Glucose-Capillary: 327 mg/dL — ABNORMAL HIGH (ref 70–99)
Glucose-Capillary: 250 mg/dL — ABNORMAL HIGH (ref 70–99)

## 2011-02-19 NOTE — Op Note (Signed)
Tyler Le, Tyler Le NO.:  [id number]  MEDICAL RECORD NO.:  [id number]  LOCATION:  5030                         FACILITY:  [facility]  PHYSICIAN:  Kerrin Champagne, M.D.   DATE OF BIRTH:  19-Feb-1946  DATE OF PROCEDURE:  02/18/2011 DATE OF DISCHARGE:                              OPERATIVE REPORT   PREOPERATIVE DIAGNOSES:  Lumbar spinal stenosis, severe central L4-L5 left lateral recess stenosis, L1-L2 and L2-L3.  POSTOPERATIVE DIAGNOSES:  Central canal stenosis L4-L5 with bilateral L4 and bilateral L5 nerve root entrapment; left lateral recess stenosis, L2- L3; minimal left lateral recess stenosis left L1-L2.  Right L4-L5 dural tear, small for suture repair.  PROCEDURES:  Central laminectomy at the L4-L5 level and left-sided L2-L3 hemilaminectomy via the MIS approach for decompression of lumbar spinal stenosis.  Right-sided L4-L5 dural tear repair.  Four 4-0 Nurolon suture, fat graft, and DuraSeal.  SURGEON:  Kerrin Champagne, MDASSISTANT:  Maud Deed, PA-C  ANESTHESIA:  General via orotracheal intubation, Dr. Gypsy Balsam.  FINDINGS:  Severe lumbar spinal stenosis centrally at the L4-L5 level and left lateral recess stenosis, L2-L3 greater than L1-L2.  ESTIMATED BLOOD LOSS:  150 mL.  CONDITION:  The patient was taken to PACU in good condition.  HISTORY OF PRESENT ILLNESS:  This patient is a 65 year old male who has been complaining of increasing back pain and discomfort over the past year-and-half to 2 years.  He is requiring large amounts of narcotic medications, unable ambulate distances greater than 100 yards without severe back pain with radiation into his lower extremities and thighs. He has undergone attempts at conservative management and these have included the ESIs without relief.  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  The patient underwent preoperative evaluation and was evaluated by Cardiology and underwent a placement of stent earlier this year. After further followup with his cardiologist he was felt to be a candidate then for intervention on the surgical basis.  The patient was then brought to the operating room to undergo central decompressive laminectomy at the L4-L5 level, left side L1-L2, L2-L3 lateral recess decompression.  INTRAOPERATIVE FINDINGS:  As above.  COMPLICATIONS:  Dural tear, right side L4-5, not involving the neural elements.  Thecal sac was repaired using four 4-0 Nurolon sutures.  A small fat graft applied to the repaired surface and then the DuraSeal.  DESCRIPTION OF PROCEDURE:  This patient was seen in the preoperative holding area and had marking centrally at the expected L4-L5 level on the left side at L1-L2, L2-L3 for continued identification during this case.  The patient was taken to the operating room following preoperative antibiotics of Ancef 2 grams he received in the operating room #4 at [facility] via stretcher.  He underwent induction of general anesthesia and was then transferred to the OR table after placement of a Foley catheter.  The patient was turned to prone position and Parc spine frame was used.  All pressure points were well-padded.  The patient did showed stiffness of the shoulders, elbows.  Lower extremity PAS hoses were used.  All pressure points were well-padded. The patient then underwent prep with DuraPrep solution, was draped in the usual manner, and iodine and Vi-Drape was used.  Standard time-out protocol identifying the patient and procedure was done.  It was carried out in a standard fashion.  The patient then had spinal needles next placed to the expected L1-L2 and L4-L5 levels.  Needles were identified as the lower needle at the L4 level  for expected central laminectomy and the upper needle at the L2 level.  Incisions were then based on these needle.  The lower incision in the midline after infiltration with Marcaine 0.5% with 1:200,000 epinephrine and the upper incision based on the upper needle approximately an inch-and-half in length in the midline after infiltration with Marcaine 0.5% with 1:200,000 epinephrine, total of 10 mL were used from the lower incisions 5.5-7 mL in the lower and upper incision.  Lower incision was approximately 2.5-3 inches in length through the skin and subcu layers directly down to the lumbodorsal fascia.  Bovie electrocautery and bipolar electrocautery were used to control bleeders.  The patient then had an incision on both sides at the expected spinous process of L4 and this was continued superiorly at L3 and inferiorly at L5 on both sides of the spinous process.  Cobb elevators were used to elevate the paralumbar muscles bilaterally. Bleeders were controlled using bipolar electrocautery and clamps placed in the expected L4-L5 level.  Intraoperative lateral C-arm was used to ascertain the correct area here with a clamp over what was expected thento be the spinous process of L4.  This was then marked and it was felt that from this level downward the interlaminar region would be identified below this clamp and then continued superiorly in order to perform central laminectomy at the L4 level.  Additionally, a first dilator was placed on the left side at the expected L2 level through the incision along the left side at the paralumbar and paraspinous region of the L2 level.  This was passed down to the L2 lamina and observed on C- arm fluoro to be in good position alignment.  This was then removed and continuation of the central lamina was carried out at the L4-L5 level. With the insertion of Boss McCullough retractor, Leksell rongeur was then used to remove about 30% of the inferior aspect of  the spinous process of L3.  The entire spinous process of L4 and superior third of the spinous process of L5.  Carefully, the central portions of the lamina were then thinned using Leksell rongeur.  Osteotomes were then used to resect small portions of the medial aspect of the facet at the L4-L5 level using a thin 1/4-inch osteotomes with loupe magnification and headlamp used during this portion of the procedure.  High-speed bur was then used carefully to thin the lamina posteriorly preserving at least 6-7 mm of pars to prevent the iatrogenic spondylolysis.  Bur was used to carefully thin the lamina down to the area just over the ligamentum flavum superior to the ligamentum flavum.  Osteotomes were then used to further osteotomize the lamina and resect them using 3-mm Kerrisons 2-mm Kerrisons.  Central portion of laminotomy was then carefully opened using 3-mm Kerrisons protecting the dura with cottonoids appropriately.  Dissection was carried up to the superior aspect of the neural arch. This was then resected after carefully placing a small cottonoid beneath the superior aspect  of the neural arch.  The 3-mm Kerrisons were then used to resect this up to the L3-L4 level.  Osteotomy on exiting the lateral aspects of the lateral recess, then bone was removed and central laminectomy widened on both sides.  The ligamentum flavum at the L3-L4 level was carefully resected as was approximately 5% of the medial aspect of the L3-L4 facet bilaterally.  This appeared to decompress the nerve roots quite nicely at the L3-L4 level.  The L4 nerve roots were then carefully followed out through the neural foramen and there was tremendous amount of hypertrophic ligamentum flavum reflected into the neural foraminal and opening this as carefully examined using a large nerve hook and then the neural hockey stick.  The hockey stick probe was passed beneath and the reflected flavum.  The flavum then was  resected using 3-mm and 2-mm Kerrisons.  This was continued inferiorly and there was found to be a large amount of hypertrophic flavum at the L4-L5 inferior aspect facet buckling against the patient's thecal sac laterally, so the central portions of the ligamentum flavum were resected down to the superior aspect of the lamina of L5 and then a small amount of superior aspect of the patient's lamina of L5 then resected, first on the left side able to decompress the left side thecal sac as well as the left L5 nerve root and then perform foraminotomy over the L5 nerve root and then continuing from superior to inferior ligamentum flavum it was then resected off the medial aspect of facet at the L4-L5 level using 2 and 3-mm Kerrisons until the lateral recess was well decompressed and the L5 nerve root noted to be exiting without difficulty.  There were abundant hypertrophic changes involving the superior aspect of lamina of L5 that was pressing on the thecal sac as well as areas of the ligamentum flavum from the L5-S1 level that appeared to be pressing over the sacs and required resection in order to free the lateral aspect of the spinal canal.  Mild nerve roots exited well on the left side.  On the right side, osteotomy in the medial aspect of the facet was carried out at about 15-20% on this side. Ligamentum flavum was carefully freed off the neural foramen L4 after placing a hockey stick catheter in the neural foramen and then resecting and reflected it off the L4 thecal sac laterally.  Again, it was found that significant amount of hypertrophic ligamentum flavum was pressing on the thecal sac on the right side and the right side L5 neural foramen was felt to be quite tight.  A 3-mm Kerrison was used to resect a portion of the superior aspect of the lamina of L5 on the right side. While resecting bone off superior aspect of the lamina of L5 on the right side, a flap was noted.  The  cottonoids were placed to protect this area and continuation of decompression was carried out, so that the foraminotomy was performed over the right L5 nerve root and then the lateral recess on the right side.  It was carefully decompressed from superior to inferior resecting the reflected portion of the ligamentum flavum and lateral recess off the right L5 nerve root.  Bone wax was applied to bleeding cancellous bony surface.  Thrombin-soaked Gelfoam was placed in the lateral recesses along the right side inferior and superior and the L4 nerve root.  These areas were then packed in order to reduce and perform hemostasis.  Bone wax was applied to  the edges of the cut spinous process of L3 and L5 again in order to obtain hemostasis.  Examining the thecal sac on the right side at the L4-L5 level, the area where the bleb was present.  There had been larger blebs where the cottonoid pressure was.  It was felt that further right-sided partial superior laminectomy of L5 was necessary, so that the small portion of the superior portion of the spinous process was resected further using accounting for apparently 50% resection of the spinous process.  The 3-mm Kerrisons were used to resect more bone off the superior aspect lamina of the L5 on the right side obtaining an area of exposure of the L5 nerve root as well as the medial superior aspect of the thecal sac over the upper aspect lamina of L5.  Operating room microscope was sterilely draped and brought into the field.  With the operating microscope, then careful examination showed dural tear to be about 3 mm in length, longitudinal and short rotation to very small bleb associated with a suspicious for dural atrophy, perhaps related to preoperative steroid use on either case and also likely related to be used and cut with Kerrisons beneath that were extremely tight, lateral recess on the right side and entry point to the neural foramen on  the right side for the L5 root.  After exposure was obtained on the operating room microscope, then carefully a single 4-0 Nurolon suture was placed through the vertex of the superior aspect of the dural tear. This was used to apply traction to the thecal sac on the right side traction superiorly.  Additional Nurolon sutures were then placed from superior to inferior closing the defect in line using 4-0 Nurolon suture and with this we were able to obtain adequate watertight repair, a total of 4 Nurolon sutures were placed.  Valsalva was performed 30 mm of pressure without any sign of leak.  With this, it was felt that the dura was adequately thin and the small graft would be helpful.  So the small portion of graft was harvested from the subcu area with right side just superficial to the lumbodorsal muscle.  This was then placed over the thecal sac on the right side and the most caudal 4-0 Nurolon suture and then lower to superior 4-0 Nurolon suture were then carefully passed with the fat graft and tied loosely in order to allow the fat graft to approximate the posterior aspect of the thecal sac in the area repaired. With this then the repair was felt to be adequate.  The neural foramen of L4 and L5 were carefully evaluated, found to be opened, and showed no signs of further nerve compression.  Irrigation was carried out and careful hemostasis.  DuraSeal was then carefully applied to the area of repair that was the posterior aspect of laminotomy area at the L4-L5 level and obtaining excellent seal.  This done, the retractors were removed and the paralumbar muscles were examined.  There was no active bleeding present from this port during the case, so the paralumbar muscles were carefully approximated with a single suture of #1 Vicryl, midline loosely.  Lumbodorsal fascia reapproximated the spinous process of L3 and to the L5 using the #1 Vicryl sutures and the lumbodorsal fascia was then  reapproximated to itself with interrupted #1 Vicryl sutures.  Deep subcu layers were approximated with interrupted #1 Vicryl suture, more superficial layers with interrupted 2-0 Vicryl sutures, and the skin closed with running subcu stitch of 4-0  Vicryl.  Attention turned to the area for MIS approach, left side L1-2L.  Here the initial dilator was replaced from previous opening and then dilation carried up to 11 mm.  The 16-mm blades were chosen, this was placed on the MIS retractor and then carefully placed over the dilator and attached to the frame of the bed using the frame provided.  C-arm fluoro was used to ascertain the correct position and alignment and docking of the patient's MIS retractors.  Ascertaining these were in correct positional alignment and carefully then a small amount of paralumbar muscle was resecting the left side at the L2 level and this continued up to the L1-L2 level inferior to the L2-L3 level.  Both medial and lateral areas were exposed, a lateral blade was used as well as inferior and superior blades.  Operating room microscope was carefully brought into the field. Retractors were placed at the expected facet as L2, L3, and expected facet at L2-L2.  Intraoperative lateral C-arm showed these to be in good position and alignment, so that under the operating room microscope, then high-speed 4-mm cutting bur was then used to make small hemilaminectomy on the left side.  It was first used to drill the superficial portion of the bone along the left side, preserving about 7- 8 mm of pars over the superior aspect of the facet at L2-L3 and this was carried down to the L2-L3 interlaminar space posteriorly.  Small amount of bone along the lateral aspect on the left side and spinous process of L2 was carefully thinned in order to allow for adequate visualization with the microscope.  Thinning the lamina appropriately, then osteotome was used to resect small portion of  the medial aspect of facet on the left side about 20-25%.  Then the resection of the lamina was carried from the caudal-cranial using 2, then 3-mm Kerrisons over the remaining thin portion of the lamina here.  This was carried up to the L1-L2 interlaminar space.  The ligamentum flavum here was resected.  Lateral recess at L1-L2 was decompressed.  Lateral recess at L2-L3 was decompressed and found to have quite a  bit of hypertrophic ligamentum flavum pressing onto the thecal sac over the left L3 nerve root, so that with resection of this reflected flavum which did have an appearance of some steroid material was allowed for decompression of lateral recess and the L3 nerve root was exiting the L3 neural foramen without further compression.  Hockey stick neural probe and a blunt-tip long nerve hook was able to be passed out at the L3 neural foramen and L2 neural foramen demonstrating their patency.  Unable to palpate any mass effect on the neural foramen would be affecting the L2 nerve root as an ex-fit in the area where expected cyst was felt to be present.  It was felt that there was no cyst identifiable that could be resected at this point.  With that, then the thecal sac and neural foramen with further three of two nerve roots were felt to be well decompressed.  Irrigation was carried out and bone wax was applied to the bleeding cancellous bone surfaces. Excess bone wax was removed.  There was no active bleeding present.  Soft tissues were allowed to fall back into place and the patient's lumbodorsal fascia was then reapproximated with interrupted 0-Vicryl sutures using the OR-6 needle. Deep subcu layers were approximated with interrupted 0-Vicryl sutures, more superficial layers with interrupted 2-0 Vicryl sutures and skin closed with a running subcu  stitch of 4-0 Vicryl.  Dermabond was applied to the incision above the L2 level and inferiorly at the L4-L5 level. Mepilex bandage was  applied.  The patient underwent a slow reactivation following return to a supine position.  He was then extubated and returned to the recovery room in satisfactory condition.  All instrument and sponge counts were correct.  PHYSICIAN ASSISTANCE'S RESPONSIBILITIES:Sheila Marita [place] performed the duties of Designer, television/film set during this surgical procedure. She assisted in the transfer of the patient from stretcher to OR table and with positioning and padding of the patient against undue pressure. She performed careful retraction of neural elements during the surgery and careful use of the suctioning about the  incision and neural elements during the case. She was present from the beginning of the case till the end. She performed assistant duties operating under the operating microscope. At the conclusion of the case, she performed closure  of the incision from the fascia to the skin and application of dressing.      Kerrin Champagne, M.D.     JEN/MEDQ  D:  02/18/2011  T:  02/18/2011  Job:  [id number]  Electronically Signed by Vira Browns M.D. on 02/19/2011 05:32:00 PM

## 2011-02-20 LAB — BASIC METABOLIC PANEL
BUN: 19 mg/dL (ref 6–23)
Chloride: 101 mEq/L (ref 96–112)
GFR calc Af Amer: >60 mL/min (ref >60)
Glucose, Bld: 262 mg/dL — ABNORMAL HIGH (ref 70–99)
Potassium: 4.4 mEq/L (ref 3.5–5.1)

## 2011-02-20 LAB — GLUCOSE, CAPILLARY

## 2011-05-01 ENCOUNTER — Other Ambulatory Visit: Payer: Self-pay | Admitting: *Deleted

## 2011-05-01 DIAGNOSIS — F419 Anxiety disorder, unspecified: Secondary | ICD-10-CM

## 2011-05-01 MED ORDER — ALPRAZOLAM 0.5 MG PO TABS: 0.5000 mg | ORAL_TABLET | Freq: Three times a day (TID) | ORAL | Status: DC | PRN

## 2011-05-01 NOTE — Telephone Encounter (Signed)
Refill on alprazolam 

## 2011-05-08 ENCOUNTER — Emergency Department (HOSPITAL_COMMUNITY): Payer: Medicare Other

## 2011-05-08 ENCOUNTER — Inpatient Hospital Stay (HOSPITAL_COMMUNITY)
Admission: EM | Admit: 2011-05-08 | Discharge: 2011-05-10 | DRG: 287 | Disposition: A | Discharge status: Home / Self Care | Payer: Medicare Other | Source: Ambulatory Visit | Attending: Cardiovascular Disease | Admitting: Cardiovascular Disease

## 2011-05-08 ENCOUNTER — Encounter (HOSPITAL_COMMUNITY): Payer: Self-pay | Admitting: *Deleted

## 2011-05-08 ENCOUNTER — Other Ambulatory Visit: Payer: Self-pay

## 2011-05-08 DIAGNOSIS — E119 Type 2 diabetes mellitus without complications: Secondary | ICD-10-CM | POA: Insufficient documentation

## 2011-05-08 DIAGNOSIS — F329 Major depressive disorder, single episode, unspecified: Secondary | ICD-10-CM | POA: Diagnosis present

## 2011-05-08 DIAGNOSIS — Y849 Medical procedure, unspecified as the cause of abnormal reaction of the patient, or of later complication, without mention of misadventure at the time of the procedure: Secondary | ICD-10-CM | POA: Diagnosis present

## 2011-05-08 DIAGNOSIS — M545 Low back pain, unspecified: Secondary | ICD-10-CM | POA: Insufficient documentation

## 2011-05-08 DIAGNOSIS — F419 Anxiety disorder, unspecified: Secondary | ICD-10-CM | POA: Insufficient documentation

## 2011-05-08 DIAGNOSIS — N189 Chronic kidney disease, unspecified: Secondary | ICD-10-CM | POA: Diagnosis present

## 2011-05-08 DIAGNOSIS — R45851 Suicidal ideations: Secondary | ICD-10-CM

## 2011-05-08 DIAGNOSIS — F339 Major depressive disorder, recurrent, unspecified: Secondary | ICD-10-CM | POA: Diagnosis present

## 2011-05-08 DIAGNOSIS — Z79899 Other long term (current) drug therapy: Secondary | ICD-10-CM

## 2011-05-08 DIAGNOSIS — I1310 Hypertensive heart and chronic kidney disease without heart failure, with stage 1 through stage 4 chronic kidney disease, or unspecified chronic kidney disease: Secondary | ICD-10-CM | POA: Diagnosis present

## 2011-05-08 DIAGNOSIS — Z7982 Long term (current) use of aspirin: Secondary | ICD-10-CM

## 2011-05-08 DIAGNOSIS — E785 Hyperlipidemia, unspecified: Secondary | ICD-10-CM | POA: Insufficient documentation

## 2011-05-08 DIAGNOSIS — I2510 Atherosclerotic heart disease of native coronary artery without angina pectoris: Secondary | ICD-10-CM | POA: Insufficient documentation

## 2011-05-08 DIAGNOSIS — Z794 Long term (current) use of insulin: Secondary | ICD-10-CM

## 2011-05-08 DIAGNOSIS — T82897A Other specified complication of cardiac prosthetic devices, implants and grafts, initial encounter: Secondary | ICD-10-CM | POA: Diagnosis present

## 2011-05-08 DIAGNOSIS — R079 Chest pain, unspecified: Secondary | ICD-10-CM

## 2011-05-08 DIAGNOSIS — F32A Depression, unspecified: Secondary | ICD-10-CM | POA: Diagnosis present

## 2011-05-08 DIAGNOSIS — I200 Unstable angina: Secondary | ICD-10-CM | POA: Diagnosis present

## 2011-05-08 DIAGNOSIS — I119 Hypertensive heart disease without heart failure: Secondary | ICD-10-CM | POA: Insufficient documentation

## 2011-05-08 DIAGNOSIS — I209 Angina pectoris, unspecified: Secondary | ICD-10-CM | POA: Insufficient documentation

## 2011-05-08 DIAGNOSIS — I25810 Atherosclerosis of coronary artery bypass graft(s) without angina pectoris: Secondary | ICD-10-CM | POA: Diagnosis present

## 2011-05-08 DIAGNOSIS — I2582 Chronic total occlusion of coronary artery: Secondary | ICD-10-CM | POA: Diagnosis present

## 2011-05-08 HISTORY — DX: Anemia, unspecified: D64.9

## 2011-05-08 HISTORY — DX: Gastro-esophageal reflux disease without esophagitis: K21.9

## 2011-05-08 HISTORY — DX: Major depressive disorder, single episode, unspecified: F32.9

## 2011-05-08 HISTORY — DX: Depression, unspecified: F32.A

## 2011-05-08 HISTORY — DX: Adverse effect of unspecified anesthetic, initial encounter: T41.45XA

## 2011-05-08 HISTORY — DX: Other complications of anesthesia, initial encounter: T88.59XA

## 2011-05-08 HISTORY — DX: Anxiety disorder, unspecified: F41.9

## 2011-05-08 HISTORY — DX: Cardiac arrhythmia, unspecified: I49.9

## 2011-05-08 LAB — PROTIME-INR
INR: 1.04 (ref 0.00–1.49)
Prothrombin Time: 13.8 seconds (ref 11.6–15.2)

## 2011-05-08 LAB — GLUCOSE, CAPILLARY: Glucose-Capillary: 226 mg/dL — ABNORMAL HIGH (ref 70–99)

## 2011-05-08 LAB — CBC
Hemoglobin: 16.4 g/dL (ref 13.0–17.0)
RBC: 5.38 MIL/uL (ref 4.22–5.81)
WBC: 6.9 10*3/uL (ref 4.0–10.5)

## 2011-05-08 LAB — CARDIAC PANEL(CRET KIN+CKTOT+MB+TROPI)
Relative Index: INVALID (ref 0.0–2.5)
Troponin I: <0.3 ng/mL (ref <0.30)

## 2011-05-08 LAB — BASIC METABOLIC PANEL
CO2: 27 mEq/L (ref 19–32)
GFR calc non Af Amer: 66 mL/min — ABNORMAL LOW (ref >90)
Glucose, Bld: 121 mg/dL — ABNORMAL HIGH (ref 70–99)
Potassium: 4 mEq/L (ref 3.5–5.1)
Sodium: 137 mEq/L (ref 135–145)

## 2011-05-08 LAB — APTT: aPTT: 89 seconds — ABNORMAL HIGH (ref 24–37)

## 2011-05-08 LAB — POCT I-STAT TROPONIN I

## 2011-05-08 LAB — CK TOTAL AND CKMB (NOT AT ARMC)
CK, MB: 2.2 ng/mL (ref 0.3–4.0)
Relative Index: 2 (ref 0.0–2.5)

## 2011-05-08 MED ORDER — ASPIRIN 300 MG RE SUPP
300.0000 mg | RECTAL | Status: AC
  Filled 2011-05-08: qty 1

## 2011-05-08 MED ORDER — VITAMIN D3 50 MCG (2000 UT) PO TABS: 2.0000 | ORAL_TABLET | Freq: Every day | ORAL | Status: DC

## 2011-05-08 MED ORDER — SODIUM CHLORIDE 0.9 % IJ SOLN
3.0000 mL | Freq: Two times a day (BID) | INTRAMUSCULAR | Status: DC
  Administered 2011-05-09 – 2011-05-10 (×3): 3 mL via INTRAVENOUS

## 2011-05-08 MED ORDER — CARVEDILOL 25 MG PO TABS
25.0000 mg | ORAL_TABLET | Freq: Two times a day (BID) | ORAL | Status: DC
  Administered 2011-05-09 – 2011-05-10 (×3): 25 mg via ORAL
  Filled 2011-05-08 (×6): qty 1

## 2011-05-08 MED ORDER — HEPARIN (PORCINE) IN NACL 100-0.45 UNIT/ML-% IJ SOLN
16.0000 [IU]/kg/h | INTRAMUSCULAR | Status: DC
  Administered 2011-05-08: 1550 [IU]/h via INTRAVENOUS
  Filled 2011-05-08 (×2): qty 250

## 2011-05-08 MED ORDER — NITROGLYCERIN IN D5W 200-5 MCG/ML-% IV SOLN
15.0000 ug/min | INTRAVENOUS | Status: DC
  Administered 2011-05-08: 5 ug/min via INTRAVENOUS
  Administered 2011-05-09: 15 ug/min via INTRAVENOUS

## 2011-05-08 MED ORDER — INSULIN GLARGINE 100 UNIT/ML ~~LOC~~ SOLN
40.0000 [IU] | Freq: Every day | SUBCUTANEOUS | Status: DC
  Filled 2011-05-08: qty 3

## 2011-05-08 MED ORDER — ALPRAZOLAM 0.5 MG PO TABS
ORAL_TABLET | ORAL | Status: AC
  Administered 2011-05-08: 0.5 mg via ORAL
  Filled 2011-05-08: qty 1

## 2011-05-08 MED ORDER — HEPARIN SODIUM (PORCINE) 5000 UNIT/ML IJ SOLN
INTRAMUSCULAR | Status: AC
  Filled 2011-05-08: qty 1

## 2011-05-08 MED ORDER — PRAMLINTIDE ACETATE 2700 MCG/2.7ML ~~LOC~~ SOPN
60.0000 [IU] | PEN_INJECTOR | Freq: Two times a day (BID) | SUBCUTANEOUS | Status: DC
  Filled 2011-05-08: qty 1

## 2011-05-08 MED ORDER — MORPHINE SULFATE 2 MG/ML IJ SOLN
2.0000 mg | Freq: Once | INTRAMUSCULAR | Status: AC
  Administered 2011-05-08: 2 mg via INTRAVENOUS
  Filled 2011-05-08: qty 1

## 2011-05-08 MED ORDER — HEPARIN (PORCINE) IN NACL 100-0.45 UNIT/ML-% IJ SOLN
INTRAMUSCULAR | Status: AC
  Administered 2011-05-08: 1550 [IU]/h via INTRAVENOUS
  Filled 2011-05-08: qty 250

## 2011-05-08 MED ORDER — NITROGLYCERIN 0.4 MG SL SUBL
0.4000 mg | SUBLINGUAL_TABLET | SUBLINGUAL | Status: DC | PRN
  Administered 2011-05-08: 0.4 mg via SUBLINGUAL
  Filled 2011-05-08: qty 25

## 2011-05-08 MED ORDER — INSULIN GLARGINE 100 UNIT/ML ~~LOC~~ SOLN
50.0000 [IU] | Freq: Every day | SUBCUTANEOUS | Status: DC
  Administered 2011-05-08: 40 [IU] via SUBCUTANEOUS
  Filled 2011-05-08: qty 3

## 2011-05-08 MED ORDER — PANTOPRAZOLE SODIUM 40 MG PO TBEC
40.0000 mg | DELAYED_RELEASE_TABLET | Freq: Every day | ORAL | Status: DC
  Administered 2011-05-08 – 2011-05-09 (×2): 40 mg via ORAL
  Filled 2011-05-08 (×2): qty 1

## 2011-05-08 MED ORDER — NITROGLYCERIN IN D5W 200-5 MCG/ML-% IV SOLN: 10.0000 ug/min | INTRAVENOUS | Status: DC

## 2011-05-08 MED ORDER — ALPRAZOLAM 0.5 MG PO TABS
0.5000 mg | ORAL_TABLET | Freq: Four times a day (QID) | ORAL | Status: DC | PRN
  Administered 2011-05-08 – 2011-05-09 (×5): 0.5 mg via ORAL
  Filled 2011-05-08 (×4): qty 1

## 2011-05-08 MED ORDER — INSULIN GLARGINE 100 UNIT/ML ~~LOC~~ SOLN: 40.0000 [IU] | Freq: Two times a day (BID) | SUBCUTANEOUS | Status: DC

## 2011-05-08 MED ORDER — OMEGA-3-ACID ETHYL ESTERS 1 G PO CAPS
1.0000 g | ORAL_CAPSULE | Freq: Every day | ORAL | Status: DC
  Administered 2011-05-08 – 2011-05-10 (×3): 1 g via ORAL
  Filled 2011-05-08 (×4): qty 1

## 2011-05-08 MED ORDER — SODIUM CHLORIDE 0.9 % IV SOLN
250.0000 mL | INTRAVENOUS | Status: DC
Start: 2011-05-09 — End: 2011-05-09
  Administered 2011-05-09: 250 mL via INTRAVENOUS
  Administered 2011-05-09: 1000 mL via INTRAVENOUS

## 2011-05-08 MED ORDER — SODIUM CHLORIDE 0.9 % IJ SOLN: 3.0000 mL | INTRAMUSCULAR | Status: DC | PRN

## 2011-05-08 MED ORDER — ASPIRIN 81 MG PO CHEW
324.0000 mg | CHEWABLE_TABLET | ORAL | Status: AC
  Administered 2011-05-08: 324 mg via ORAL
  Filled 2011-05-08: qty 4

## 2011-05-08 MED ORDER — HEPARIN BOLUS VIA INFUSION
4000.0000 [IU] | Freq: Once | INTRAVENOUS | Status: AC
  Administered 2011-05-08: 4000 [IU] via INTRAVENOUS
  Filled 2011-05-08: qty 4000

## 2011-05-08 MED ORDER — HYDROCODONE-ACETAMINOPHEN 5-325 MG PO TABS
ORAL_TABLET | ORAL | Status: AC
  Filled 2011-05-08: qty 2

## 2011-05-08 MED ORDER — ASPIRIN 325 MG PO TABS
325.0000 mg | ORAL_TABLET | Freq: Every day | ORAL | Status: DC
Start: 2011-05-09 — End: 2011-05-09
  Administered 2011-05-09: 325 mg via ORAL
  Filled 2011-05-08: qty 1

## 2011-05-08 MED ORDER — INSULIN ASPART 100 UNIT/ML ~~LOC~~ SOLN
0.0000 [IU] | Freq: Three times a day (TID) | SUBCUTANEOUS | Status: DC
  Administered 2011-05-09 – 2011-05-10 (×2): 3 [IU] via SUBCUTANEOUS
  Filled 2011-05-08: qty 3

## 2011-05-08 MED ORDER — ACETAMINOPHEN 325 MG PO TABS: 650.0000 mg | ORAL_TABLET | ORAL | Status: DC | PRN

## 2011-05-08 MED ORDER — ZOLPIDEM TARTRATE 5 MG PO TABS
10.0000 mg | ORAL_TABLET | Freq: Every evening | ORAL | Status: DC | PRN
  Administered 2011-05-08 – 2011-05-09 (×2): 10 mg via ORAL
  Filled 2011-05-08 (×4): qty 1

## 2011-05-08 MED ORDER — ONDANSETRON HCL 4 MG/2ML IJ SOLN: 4.0000 mg | Freq: Four times a day (QID) | INTRAMUSCULAR | Status: DC | PRN

## 2011-05-08 MED ORDER — THERA M PLUS PO TABS
1.0000 | ORAL_TABLET | Freq: Every day | ORAL | Status: DC
  Administered 2011-05-08 – 2011-05-10 (×3): 1 via ORAL
  Filled 2011-05-08 (×3): qty 1

## 2011-05-08 MED ORDER — AMLODIPINE BESYLATE 10 MG PO TABS
10.0000 mg | ORAL_TABLET | Freq: Every day | ORAL | Status: DC
  Administered 2011-05-08 – 2011-05-10 (×3): 10 mg via ORAL
  Filled 2011-05-08 (×3): qty 1

## 2011-05-08 MED ORDER — SIMVASTATIN 20 MG PO TABS
20.0000 mg | ORAL_TABLET | Freq: Every day | ORAL | Status: DC
  Administered 2011-05-08 – 2011-05-09 (×2): 20 mg via ORAL
  Filled 2011-05-08 (×3): qty 1

## 2011-05-08 MED ORDER — CITALOPRAM HYDROBROMIDE 40 MG PO TABS
40.0000 mg | ORAL_TABLET | Freq: Every day | ORAL | Status: DC
  Filled 2011-05-08 (×2): qty 1

## 2011-05-08 MED ORDER — NITROGLYCERIN IN D5W 200-5 MCG/ML-% IV SOLN
INTRAVENOUS | Status: AC
  Administered 2011-05-08: 5 ug/min via INTRAVENOUS
  Filled 2011-05-08: qty 250

## 2011-05-08 MED ORDER — VITAMIN D3 25 MCG (1000 UNIT) PO TABS
1000.0000 [IU] | ORAL_TABLET | Freq: Every day | ORAL | Status: DC
  Administered 2011-05-08 – 2011-05-10 (×3): 1000 [IU] via ORAL
  Filled 2011-05-08 (×3): qty 1

## 2011-05-08 MED ORDER — HYDROCODONE-ACETAMINOPHEN 10-325 MG PO TABS
1.0000 | ORAL_TABLET | ORAL | Status: DC | PRN
  Administered 2011-05-08 – 2011-05-09 (×5): 1 via ORAL
  Filled 2011-05-08 (×4): qty 1

## 2011-05-08 NOTE — ED Provider Notes (Signed)
Date: 05/08/2011  Rate:90  Rhythm: normal sinus rhythm  QRS Axis: normal  Intervals: normal  ST/T Wave abnormalities: normal  Conduction Disutrbances:none  Narrative Interpretation: Normal EKG  Old EKG Reviewed: changes noted--EKG of September 08, 2010, showed right bundle branch block.         Carleene Cooper III, MD 05/08/11 [phone]

## 2011-05-08 NOTE — ED Provider Notes (Signed)
History     CSN: [id number] Arrival date & time: 05/08/2011 12:18 PM   First MD Initiated Contact with Patient 05/08/11 1348      Chief Complaint  Patient presents with  . Chest Pain    (Consider location/radiation/quality/duration/timing/severity/associated sxs/prior treatment) The history is provided by the patient.   the patient is a 65 year old male with a history significant for coronary artery disease effient a coronary artery bypass graft in 2001. He is also status post a bare-metal stent placement in March of 2012 after which he was placed on effient for 2 months. Today he complains of intermittent chest pain for the last 2-3 weeks which radiates down the left arm. It occurs multiple times a day and is always relieved after taking his at home nitroglycerin. It is associated with nausea diaphoresis and palpitations. There is no associated shortness of breath. The pain is described as similar to that which he felt before his PCI in March however this pain is described as worse than he experienced previously. Nothing in particular makes the pain worse. The patient's cardiologist is Dr. Patty Sermons.  In addition to his complaint of chest pain, the patient complains of worsening depression. He reports a history of similar with previous psychiatric hospitalizations. He notes that he had back surgery approximately 2 months ago, and since that time has had worsening feelings of hopelessness and depression secondary to his health status. He admits to suicidal ideation with thoughts of hurting himself but denies any plan. He denies homicidal ideation or hallucinations. He does have a psychiatrist at the [facility] hospital however he is unhappy with the care he has been receiving there and would like to obtain care with a new psychiatric practitioner.  Past Medical History  Diagnosis Date  . Hypertension   . Chronic kidney disease     nephrolithiasis  . Coronary artery disease   . S/P CABG (coronary  artery bypass graft) 2001  . Status post coronary artery stent placement March 2012    BMS to SVG to intermediate and OM  . Dyslipidemia   . Obesities, morbid   . Insulin dependent diabetes mellitus   . Back pain     Past Surgical History  Procedure Date  . Appendectomy   . Coronary stent placement March 2012    BMS to SVG to intermediate/OM  . Coronary artery bypass graft 2001    Family History  Problem Relation Age of Onset  . Heart disease Mother   . Heart disease Father   . Heart attack Father   . Heart disease Brother     History  Substance Use Topics  . Smoking status: Former Smoker    Quit date: 06/24/1988  . Smokeless tobacco: Not on file  . Alcohol Use: No      Review of Systems  Constitutional: Negative for fever and chills.  HENT: Negative for ear pain, congestion, rhinorrhea, neck pain, neck stiffness and postnasal drip.   Eyes: Negative for pain and visual disturbance.  Respiratory: Positive for chest tightness. Negative for cough and shortness of breath.   Cardiovascular: Positive for chest pain and palpitations. Negative for leg swelling.  Gastrointestinal: Positive for nausea. Negative for vomiting and abdominal pain.  Genitourinary: Negative for dysuria and flank pain.  Musculoskeletal: Positive for back pain. Negative for joint swelling and gait problem.  Neurological: Negative for dizziness, seizures, weakness, light-headedness and headaches.  Hematological: Does not bruise/bleed easily.  Psychiatric/Behavioral: Negative for behavioral problems and confusion.    Allergies  Altace; Metformin and related; Pravachol; and Wellbutrin  Home Medications   Current Outpatient Rx  Name Route Sig Dispense Refill  . ALPRAZOLAM 0.5 MG PO TABS Oral Take 0.5 mg by mouth 4 (four) times daily as needed. For anxiety     . AMLODIPINE BESYLATE 5 MG PO TABS Oral Take 5 mg by mouth daily.      . ASPIRIN 325 MG PO TABS Oral Take 325 mg by mouth daily.     Marland Kitchen  CARVEDILOL 25 MG PO TABS Oral Take 25 mg by mouth 2 (two) times daily with a meal.      . VITAMIN D3 2000 UNITS PO TABS Oral Take 2 tablets by mouth daily.      Marland Kitchen CITALOPRAM HYDROBROMIDE 40 MG PO TABS Oral Take 40 mg by mouth daily.      . ERGOCALCIFEROL 50000 UNITS PO CAPS Oral Take 2,000 Units by mouth daily.     Marland Kitchen HYDROCODONE-ACETAMINOPHEN 10-325 MG PO TABS Oral Take 1 tablet by mouth every 4 (four) hours as needed. For pain     . INSULIN ASPART PROT & ASPART (70-30) 100 UNIT/ML [place] SUSP Subcutaneous Inject 40-50 Units into the skin 2 (two) times daily with a meal. Take 50 units in the morning and 40 units in the morning     . ISOSORBIDE MONONITRATE ER 30 MG PO TB24 Oral Take 30 mg by mouth at bedtime. Take whole tablet daily at night     . LISINOPRIL 5 MG PO TABS Oral Take 2.5 mg by mouth daily.      Marland Kitchen ONE-DAILY MULTI VITAMINS PO TABS Oral Take 1 tablet by mouth daily.     Marland Kitchen FISH OIL 1000 MG PO CPDR Oral Take 1 capsule by mouth daily.     Marland Kitchen OMEPRAZOLE 20 MG PO CPDR Oral Take 20 mg by mouth 2 (two) times daily before a meal.      . PRAMLINTIDE ACETATE 2700 MCG/2.7ML [place] SOLN Subcutaneous Inject 60 Units into the skin 2 (two) times daily before a meal.      . SIMVASTATIN 40 MG PO TABS Oral Take 20 mg by mouth at bedtime.      Marland Kitchen ZOLPIDEM TARTRATE 10 MG PO TABS Oral Take 10 mg by mouth at bedtime as needed. For insomnia      BP 111/72  Pulse 90  Temp(Src) 97.9 F (36.6 C) (Oral)  Resp 17  Ht 6\' 3"  (1.905 m)  Wt 261 lb (118.389 kg)  BMI 32.62 kg/m2  SpO2 97%  Physical Exam  Constitutional: He is oriented to person, place, and time. He appears well-developed and well-nourished. He appears distressed.  HENT:  Head: Normocephalic and atraumatic.  Eyes: EOM are normal. Pupils are equal, round, and reactive to light.  Neck: Normal range of motion. Neck supple. No JVD present.  Cardiovascular: Normal rate, regular rhythm, normal heart sounds and intact distal pulses.   Pulmonary/Chest: Effort  normal and breath sounds normal.  Abdominal: Soft. Bowel sounds are normal. He exhibits no distension. There is no tenderness.  Musculoskeletal: Normal range of motion. He exhibits no edema and no tenderness.  Neurological: He is alert and oriented to person, place, and time. No cranial nerve deficit. Coordination normal.  Skin: Skin is warm and dry. No rash noted. He is not diaphoretic.  Psychiatric: His speech is normal and behavior is normal. Judgment normal. Thought content is not paranoid and not delusional. Cognition and memory are normal. He exhibits a depressed mood. He expresses  suicidal ideation. He expresses no homicidal ideation. He expresses no suicidal plans and no homicidal plans.    ED Course  Procedures (including critical care time)  Labs Reviewed  CBC - Abnormal; Notable for the following:    Platelets 146 (*)    All other components within normal limits  BASIC METABOLIC PANEL - Abnormal; Notable for the following:    Glucose, Bld 121 (*)    GFR calc non Af Amer 66 (*)    GFR calc Af Amer 77 (*)    All other components within normal limits  CK TOTAL AND CKMB  POCT I-STAT TROPONIN I  I-STAT TROPONIN I   Results for orders placed during the hospital encounter of 05/08/11  CBC      Component Value Range   WBC 6.9  4.0 - 10.5 (K/uL)   RBC 5.38  4.22 - 5.81 (MIL/uL)   Hemoglobin 16.4  13.0 - 17.0 (g/dL)   HCT 84.6  96.2 - 95.2 (%)   MCV 85.1  78.0 - 100.0 (fL)   MCH 30.5  26.0 - 34.0 (pg)   MCHC 35.8  30.0 - 36.0 (g/dL)   RDW 84.1  32.4 - 40.1 (%)   Platelets 146 (*) 150 - 400 (K/uL)  BASIC METABOLIC PANEL      Component Value Range   Sodium 137  135 - 145 (mEq/L)   Potassium 4.0  3.5 - 5.1 (mEq/L)   Chloride 101  96 - 112 (mEq/L)   CO2 27  19 - 32 (mEq/L)   Glucose, Bld 121 (*) 70 - 99 (mg/dL)   BUN 18  6 - 23 (mg/dL)   Creatinine, Ser 0.27  0.50 - 1.35 (mg/dL)   Calcium 9.8  8.4 - 25.3 (mg/dL)   GFR calc non Af Amer 66 (*) >90 (mL/min)   GFR calc Af Amer  77 (*) >90 (mL/min)  CK TOTAL AND CKMB      Component Value Range   Total CK 110  7 - 232 (U/L)   CK, MB 2.2  0.3 - 4.0 (ng/mL)   Relative Index 2.0  0.0 - 2.5   POCT I-STAT TROPONIN I      Component Value Range   Troponin i, poc 0.00  0.00 - 0.08 (ng/mL)   Comment 3            Dg Chest 2 View  05/08/2011  *RADIOLOGY REPORT*  Clinical Data: Mid to left chest pain.  Previous CABG. Hypertension and diabetes.  CHEST - 2 VIEW  Comparison: 09/07/2010  Findings: There has been previous median sternotomy and CABG. Heart size is normal.  There is calcification of the aorta.  The lungs are clear.  The vascularity is normal.  No effusions. Ordinary degenerative changes effect the spine.  IMPRESSION: Previous CABG.  No active disease.  Original Report Authenticated By: Thomasenia Sales, M.D.      Dg Chest 2 View  05/08/2011  *RADIOLOGY REPORT*  Clinical Data: Mid to left chest pain.  Previous CABG. Hypertension and diabetes.  CHEST - 2 VIEW  Comparison: 09/07/2010  Findings: There has been previous median sternotomy and CABG. Heart size is normal.  There is calcification of the aorta.  The lungs are clear.  The vascularity is normal.  No effusions. Ordinary degenerative changes effect the spine.  IMPRESSION: Previous CABG.  No active disease.  Original Report Authenticated By: Thomasenia Sales, M.D.   ED attending: Pt with chest pain, prior CABG in appx 2000, with  angioplasty and stents earlier this year, presents today with chest pain typical for his CAD.  Exam shows lungs clear, heart sound normal. EKG is non-acute.  TNI is normal.  Pt to be admitted to [facility] for chest pain rule-out.  Carleene Cooper III, M.D.   1. Chest pain   2. Suicidal ideation       MDM  65 year old male with significant cardiac history with 2-3 weeks of intermittent chest pain relieved with nitroglycerin. Prior chart and operative reports reviewed. Labs and x-ray reviewed by myself. Given patient's history and  complaint of pain that feels similar to prior cardiac events, we'll plan to admit the patient under the service of his cardiologist Dr. Patty Sermons. In addition the patient expresses suicidal ideation for which we will ask the admitting doctor to consult psychiatry once the patient is on the floor.  Medical screening examination/treatment/procedure(s) were conducted as a shared visit with non-physician practitioner(s) and myself.  I personally evaluated the patient during the encounter Osvaldo Human, M.D.      [address] [place], [place] 05/08/11 1540  Carleene Cooper III, MD 05/08/11 [phone]

## 2011-05-08 NOTE — ED Notes (Signed)
Reports intermittent chest pain x 2 weeks, mid chest and into back. ekg done at triage, airway intact, resp e/u.

## 2011-05-08 NOTE — ED Notes (Signed)
Pt. Admitted Report given pt. To be transferred via stretcher with cardiac monitor and RN

## 2011-05-08 NOTE — ED Notes (Signed)
Pt. To x-ray via stretcher with transporter

## 2011-05-08 NOTE — H&P (Signed)
Patient ID: ZAID TOMES MRN: [id number], DOB/AGE: 24-May-1946   Admit date: 05/08/2011   Primary Physician: Kaleen Mask, MD Primary Cardiologist: Patty Sermons  Pt. Profile:   65 y/o male w/ h/o cad s/p cabg and subsequent pci of vg-ri-om earlier this year who presents with a 2 wk h/o intermittent c/p.  Problem List: Past Medical History  Diagnosis Date  . Hypertension   . Chronic kidney disease     nephrolithiasis  . Coronary artery disease   .         S/P CABG (coronary artery bypass graft) x 4 (lima-lad; vg-ri-om;         vg-rca) 2001  .         Cath 08/2010: native 3vd - w patent lima-lad, occl vg-rca, and 99p stenosis is vg-ri-om.  vg-ri-om was treated w 4.0 x 23mm vision bms March 2012      . Dyslipidemia   . Obesities, morbid   . Insulin dependent diabetes mellitus   . Back pain     Past Surgical History  Procedure Date  . Appendectomy   . Coronary stent placement March 2012    BMS to SVG to intermediate/OM  . Coronary artery bypass graft 2001     Allergies:  Allergies  Allergen Reactions  . Altace Nausea Only  . Metformin And Related Nausea And Vomiting  . Pravachol Nausea And Vomiting  . Wellbutrin (Bupropion Hcl) Nausea And Vomiting    HPI:  65 y/o male with h/o CAD as outlined above.  He has been fairly depressed over the past 6 wks - no specific reason and denies suicidal ideation but would like to see psych.    He presents to the ER today b/c of a 2-3 wk h/o intermittent rest and exertional sscp, similar to prior angina, assoc w sob, lasting 15-30 mins and relieved w sl ntg.  He's been having 3-4 episodes/day.  He had an episode while walking in [employer] yesterday and another episode this am that prompted his presentation.  He is currently pain free and at this point his ce are negative.  ECG is non-acute.   Medications Prior to Admission  Medication Sig Dispense Refill  . ALPRAZolam (XANAX) 0.5 MG tablet Take 0.5 mg by mouth 4 (four) times  daily as needed. For anxiety       . amLODipine (NORVASC) 5 MG tablet Take 5 mg by mouth daily.        Marland Kitchen aspirin 325 MG tablet Take 325 mg by mouth daily.       . carvedilol (COREG) 25 MG tablet Take 25 mg by mouth 2 (two) times daily with a meal.        . ergocalciferol (VITAMIN D2) 50000 UNITS capsule Take 2,000 Units by mouth daily.       . isosorbide mononitrate (IMDUR) 30 MG 24 hr tablet Take 30 mg by mouth at bedtime. Take whole tablet daily at night       . Multiple Vitamin (MULTIVITAMIN) tablet Take 1 tablet by mouth daily.       . Omega-3 Fatty Acids (FISH OIL) 1000 MG CPDR Take 1 capsule by mouth daily.       Marland Kitchen zolpidem (AMBIEN) 10 MG tablet Take 10 mg by mouth at bedtime as needed. For insomnia            Lantus 50u am; 40u pm       symlin 60u am; 60u pm       Celexa  Family History  Problem Relation Age of Onset  . Heart disease Mother   . Heart disease Father   . Heart attack Father   . Heart disease Brother      History   Social History  . Marital Status: Single    Spouse Name: N/A    Number of Children: N/A  . Years of Education: N/A        Lives in [place] with wife.  Social History Main Topics  . Smoking status: Former Smoker    Quit date: 06/24/1988 - smoked for about 20 yrs.  . Smokeless tobacco: Not on file  . Alcohol Use: No  . Drug Use: No  . Sexually Active: Yes   Review of Systems: General: + for intermittent sweats and chills.  Cardiovascular: c/p and doe as outlined in the hpi. palpitations, paroxysmal nocturnal dyspnea or shortness of breath Dermatological: negative for rash Respiratory: negative for cough or wheezing Urologic: notable for hesitancy and dribbling. Abdominal: negative for nausea, vomiting, diarrhea, bright red blood per rectum, melena, or hematemesis Neurologic: negative for visual changes, syncope, or dizziness All other systems reviewed and are otherwise negative except as noted above.  Physical Exam: Blood pressure  149/76, pulse 85, temperature 97.9 F (36.6 C), temperature source Oral, resp. rate 16, height 6\' 3"  (1.905 m), weight 261 lb (118.389 kg), SpO2 98.00%.  General: Well developed, well nourished, in no acute distress. Head: Normocephalic, atraumatic, sclera non-icteric, no xanthomas, nares are without discharge.  Neck: Negative for carotid bruits. JVD not elevated.  Obese. Lungs: Clear bilaterally to auscultation without wheezes, rales, or rhonchi. Breathing is unlabored. Heart: RRR with S1 S2. No murmurs, rubs, or gallops appreciated. Abdomen: Soft, non-tender, non-distended with normoactive bowel sounds. No hepatomegaly. No rebound/guarding. No obvious abdominal masses. Msk:  Strength and tone appears normal for age. Extremities: No clubbing, cyanosis or edema.  Distal pedal pulses are 2+ and equal bilaterally. Neuro: Alert and oriented X 3. Moves all extremities spontaneously. Psych:  Responds to questions appropriately with a normal affect.  Labs:   Results for orders placed during the hospital encounter of 05/08/11 (from the past 72 hour(s))  CBC     Status: Abnormal   Collection Time   05/08/11  2:43 PM      Component Value Range Comment   WBC 6.9  4.0 - 10.5 (K/uL)    RBC 5.38  4.22 - 5.81 (MIL/uL)    Hemoglobin 16.4  13.0 - 17.0 (g/dL)    HCT 16.1  09.6 - 04.5 (%)    MCV 85.1  78.0 - 100.0 (fL)    MCH 30.5  26.0 - 34.0 (pg)    MCHC 35.8  30.0 - 36.0 (g/dL)    RDW 40.9  81.1 - 91.4 (%)    Platelets 146 (*) 150 - 400 (K/uL)   BASIC METABOLIC PANEL     Status: Abnormal   Collection Time   05/08/11  2:43 PM      Component Value Range Comment   Sodium 137  135 - 145 (mEq/L)    Potassium 4.0  3.5 - 5.1 (mEq/L)    Chloride 101  96 - 112 (mEq/L)    CO2 27  19 - 32 (mEq/L)    Glucose, Bld 121 (*) 70 - 99 (mg/dL)    BUN 18  6 - 23 (mg/dL)    Creatinine, Ser 7.82  0.50 - 1.35 (mg/dL)    Calcium 9.8  8.4 - 10.5 (mg/dL)    GFR  calc non Af Amer 66 (*) >90 (mL/min)    GFR calc Af  Amer 77 (*) >90 (mL/min)   CK TOTAL AND CKMB     Status: Normal   Collection Time   05/08/11  2:43 PM      Component Value Range Comment   Total CK 110  7 - 232 (U/L)    CK, MB 2.2  0.3 - 4.0 (ng/mL)    Relative Index 2.0  0.0 - 2.5    POCT I-STAT TROPONIN I     Status: Normal   Collection Time   05/08/11  2:58 PM      Component Value Range Comment   Troponin i, poc 0.00  0.00 - 0.08 (ng/mL)    Comment 3               Radiology/Studies: Dg Chest 2 View  05/08/2011  *RADIOLOGY REPORT*  Clinical Data: Mid to left chest pain.  Previous CABG. Hypertension and diabetes.  CHEST - 2 VIEW  Comparison: 09/07/2010  Findings: There has been previous median sternotomy and CABG. Heart size is normal.  There is calcification of the aorta.  The lungs are clear.  The vascularity is normal.  No effusions. Ordinary degenerative changes effect the spine.  IMPRESSION: Previous CABG.  No active disease.  Original Report Authenticated By: Thomasenia Sales, M.D.    EKG: RSR, no acute st/t changes   ASSESSMENT AND PLAN:   1.  USA/CAD:  Pt presents with a 2-3 wk h/o intermittent rest and exertional c/p concerning for angina.  Admit, cycle ce, add heparin and ntg.  Cont home meds.  Plan cath in am.  2.  HTN:  Not adequately controlled.  Titrate norvasc to 10 daily.  Add iv ntg for now.  Follow.  3.  HL:  Check lipids/lfts.  On simvastatin.  4.  DM:  Cont home meds.  Add cbg's and ssi.  5.  Depression:  Pt says he's been very depressed over the past 6 wks.  PCP has been adjusting his home psych meds (Celexa apparently added yesterday).  Will ask psych to see in am.   Signed, Nicolasa Ducking, NP 05/08/2011, 3:56 PM

## 2011-05-08 NOTE — Progress Notes (Signed)
ANTICOAGULATION CONSULT NOTE - Initial Consult  Pharmacy Consult for Heparin Indication: chest pain/ACS  Allergies  Allergen Reactions  . Altace Nausea Only  . Metformin And Related Nausea And Vomiting  . Pravachol Nausea And Vomiting  . Wellbutrin (Bupropion Hcl) Nausea And Vomiting    Patient Measurements: Height: 6\' 3"  (190.5 cm) Weight: 261 lb (118.389 kg) IBW/kg (Calculated) : 84.5  Adjusted Body Weight:98kg  Vital Signs: Temp: 97.9 F (36.6 C) (11/14 1219) Temp src: Oral (11/14 1219) BP: 149/75 mmHg (11/14 1630) Pulse Rate: 84  (11/14 1630)  Labs:  Basename 05/08/11 1443  HGB 16.4  HCT 45.8  PLT 146*  APTT --  LABPROT --  INR --  HEPARINUNFRC --  CREATININE 1.12  CKTOTAL 110  CKMB 2.2  TROPONINI --   Estimated Creatinine Clearance: 88.8 ml/min (by C-G formula based on Cr of 1.12).  Medical History: Past Medical History  Diagnosis Date  . Hypertension   . Chronic kidney disease     nephrolithiasis  . Coronary artery disease   . S/P CABG (coronary artery bypass graft) 2001  . Status post coronary artery stent placement March 2012    BMS to SVG to intermediate and OM  . Dyslipidemia   . Obesities, morbid   . Insulin dependent diabetes mellitus   . Back pain     Medications:   (Not in a hospital admission)  Assessment: 65 y/o male patient admitted with chest pain and significant cardiac history requiring anticoagulation for r/o MI. Plan for cath in am. Goal of Therapy:  Heparin level 0.3-0.7 units/ml   Plan:  Heparin 4000 unit IV bolus followed by infusion at 1550 units/hr. Check 6 hour heparin level. Daily cbc and heparin level.  Verlene Mayer, PharmD, BCPS Pager [phone]  05/08/2011,5:14 PM

## 2011-05-08 NOTE — ED Notes (Signed)
Pt presents with 1 week h/o intermittent chest pain.  Pt reports pain begins to L side, radiates across chest and into his back.  Pt reports diaphoresis and shortness of breath.  Pt reports weight loss of approximately 20-25 pounds over the past month, reports decreased appetite, weakness and "alot of depression".  Pt reports back surgery 2 months ago and reports "I haven't been the same since".  Pt reports intermittent suicidal ideations but denies any plan.  Pt reports he would not act on his SI, reports depression medication was changed 6 weeks ago, but he recently went back to what he was taking.  Pt is alert, oriented, reports h/o angina.  Pt seen at PCP yesterday.

## 2011-05-08 NOTE — H&P (Signed)
Patient examined and chart reviewed with PA Chest pain with known disease.  Heparin.  Cath in am. Also concerning depression with somatic effects including weight loss.  Citalopram just restarted by [place].  Psych to see while in hospital Tyler Le 10:52 PM 05/08/2011

## 2011-05-08 NOTE — Progress Notes (Signed)
Pt. During admission history admitted to having recent thoughts of harming himself in the past 6 months. Pt currently has no thoughts of harm and has had his depression medication changed through outpatient care. MD notified of the situation. Pt. Currently not on precautions and we will continue to monitor the pt.

## 2011-05-09 ENCOUNTER — Encounter (HOSPITAL_COMMUNITY)
Admission: EM | Disposition: A | Discharge status: Home / Self Care | Payer: Self-pay | Source: Ambulatory Visit | Attending: Cardiovascular Disease

## 2011-05-09 ENCOUNTER — Other Ambulatory Visit: Payer: Self-pay

## 2011-05-09 DIAGNOSIS — R079 Chest pain, unspecified: Secondary | ICD-10-CM

## 2011-05-09 DIAGNOSIS — I2510 Atherosclerotic heart disease of native coronary artery without angina pectoris: Secondary | ICD-10-CM

## 2011-05-09 HISTORY — PX: LEFT HEART CATHETERIZATION WITH CORONARY/GRAFT ANGIOGRAM: SHX5450

## 2011-05-09 LAB — BASIC METABOLIC PANEL
GFR calc non Af Amer: 70 mL/min — ABNORMAL LOW (ref >90)
Glucose, Bld: 213 mg/dL — ABNORMAL HIGH (ref 70–99)
Potassium: 3.9 mEq/L (ref 3.5–5.1)
Sodium: 139 mEq/L (ref 135–145)

## 2011-05-09 LAB — GLUCOSE, CAPILLARY
Glucose-Capillary: 153 mg/dL — ABNORMAL HIGH (ref 70–99)
Glucose-Capillary: 121 mg/dL — ABNORMAL HIGH (ref 70–99)
Glucose-Capillary: 173 mg/dL — ABNORMAL HIGH (ref 70–99)

## 2011-05-09 LAB — CBC
Hemoglobin: 14.3 g/dL (ref 13.0–17.0)
MCHC: 34.8 g/dL (ref 30.0–36.0)
Platelets: 127 10*3/uL — ABNORMAL LOW (ref 150–400)
RDW: 12.7 % (ref 11.5–15.5)

## 2011-05-09 LAB — LIPID PANEL: HDL: 27 mg/dL — ABNORMAL LOW (ref >39)

## 2011-05-09 LAB — CARDIAC PANEL(CRET KIN+CKTOT+MB+TROPI)
CK, MB: 2 ng/mL (ref 0.3–4.0)
Troponin I: <0.3 ng/mL (ref <0.30)
Troponin I: <0.3 ng/mL (ref <0.30)

## 2011-05-09 LAB — HEPARIN LEVEL (UNFRACTIONATED)
Heparin Unfractionated: 0.29 IU/mL — ABNORMAL LOW (ref 0.30–0.70)
Heparin Unfractionated: 0.41 IU/mL (ref 0.30–0.70)

## 2011-05-09 LAB — POCT ACTIVATED CLOTTING TIME: Activated Clotting Time: 127 seconds

## 2011-05-09 SURGERY — LEFT HEART CATHETERIZATION WITH CORONARY/GRAFT ANGIOGRAM
Anesthesia: LOCAL

## 2011-05-09 MED ORDER — INSULIN GLARGINE 100 UNIT/ML ~~LOC~~ SOLN: 50.0000 [IU] | Freq: Every day | SUBCUTANEOUS | Status: DC

## 2011-05-09 MED ORDER — HEPARIN (PORCINE) IN NACL 2-0.9 UNIT/ML-% IJ SOLN
INTRAMUSCULAR | Status: AC
  Filled 2011-05-09: qty 2000

## 2011-05-09 MED ORDER — HEPARIN (PORCINE) IN NACL 100-0.45 UNIT/ML-% IJ SOLN
1650.0000 [IU]/h | INTRAMUSCULAR | Status: DC
  Administered 2011-05-09: 1650 [IU]/h via INTRAVENOUS
  Filled 2011-05-09 (×2): qty 250

## 2011-05-09 MED ORDER — CITALOPRAM HYDROBROMIDE 20 MG PO TABS
20.0000 mg | ORAL_TABLET | Freq: Two times a day (BID) | ORAL | Status: DC
  Administered 2011-05-09 – 2011-05-10 (×3): 20 mg via ORAL
  Filled 2011-05-09 (×4): qty 1

## 2011-05-09 MED ORDER — FENTANYL CITRATE 0.05 MG/ML IJ SOLN
INTRAMUSCULAR | Status: AC
  Filled 2011-05-09: qty 2

## 2011-05-09 MED ORDER — INSULIN GLARGINE 100 UNIT/ML ~~LOC~~ SOLN
20.0000 [IU] | Freq: Once | SUBCUTANEOUS | Status: AC
  Administered 2011-05-09: 20 [IU] via SUBCUTANEOUS
  Filled 2011-05-09: qty 3

## 2011-05-09 MED ORDER — NITROGLYCERIN 0.2 MG/ML ON CALL CATH LAB
INTRAVENOUS | Status: AC
  Filled 2011-05-09: qty 1

## 2011-05-09 MED ORDER — INSULIN GLARGINE 100 UNIT/ML ~~LOC~~ SOLN
40.0000 [IU] | Freq: Every day | SUBCUTANEOUS | Status: DC
  Administered 2011-05-09: 40 [IU] via SUBCUTANEOUS
  Filled 2011-05-09: qty 3

## 2011-05-09 MED ORDER — ASPIRIN 81 MG PO CHEW
81.0000 mg | CHEWABLE_TABLET | Freq: Every day | ORAL | Status: DC
  Administered 2011-05-10: 81 mg via ORAL
  Filled 2011-05-09: qty 1

## 2011-05-09 MED ORDER — BUPROPION HCL ER (SR) 100 MG PO TB12
100.0000 mg | ORAL_TABLET | Freq: Every day | ORAL | Status: DC
  Filled 2011-05-09 (×2): qty 1

## 2011-05-09 MED ORDER — BIOTENE DRY MOUTH MT LIQD
15.0000 mL | Freq: Two times a day (BID) | OROMUCOSAL | Status: DC
  Administered 2011-05-09 – 2011-05-10 (×3): 15 mL via OROMUCOSAL

## 2011-05-09 MED ORDER — LIDOCAINE HCL (PF) 1 % IJ SOLN
INTRAMUSCULAR | Status: AC
  Filled 2011-05-09: qty 30

## 2011-05-09 MED ORDER — MIDAZOLAM HCL 2 MG/2ML IJ SOLN
INTRAMUSCULAR | Status: AC
  Filled 2011-05-09: qty 2

## 2011-05-09 MED ORDER — SODIUM CHLORIDE 0.9 % IV SOLN
1.0000 mL/kg/h | INTRAVENOUS | Status: AC
  Administered 2011-05-09: 1 mL/kg/h via INTRAVENOUS

## 2011-05-09 NOTE — H&P (View-Only) (Signed)
@ Subjective:  Denies SSCP, palpitations or Dyspnea Back pain and depression  Objective:  Vital Signs in the last 24 hours:       Wt Readings from Last 1 Encounters:  05/09/11 118.5 kg (261 lb 3.9 oz)    Temp:  [97.5 F (36.4 C)-98.8 F (37.1 C)] 98.2 F (36.8 C) (11/15 0634) Pulse Rate:  [81-99] 99  (11/15 0634) Resp:  [13-26] 20  (11/15 0634) BP: (76-188)/(56-107) 164/107 mmHg (11/15 0634) SpO2:  [96 %-99 %] 96 % (11/15 0634) Weight:  [118.2 kg (260 lb 9.3 oz)-118.5 kg (261 lb 3.9 oz)] 261 lb 3.9 oz (118.5 kg) (11/15 0215)  Intake/Output from previous day: 11/14 0701 - 11/15 0700 In: 567.6 [P.O.:60; I.V.:507.6] Out: -  Intake/Output from this shift:    Physical Exam: Affect appropriate Healthy:  appears stated age HEENT: normal Neck supple with no adenopathy JVP normal no bruits no thyromegaly Lungs clear with no wheezing and good diaphragmatic motion Heart:  S1/S2 no murmur,rub, gallop or click PMI normal Abdomen: benighn, BS positve, no tenderness, no AAA no bruit.  No HSM or HJR Distal pulses intact with no bruits No edema Neuro non-focal Skin warm and dry No muscular weakness   Lab Results:  Basename 05/09/11 0505 05/08/11 1443  WBC 6.0 6.9  HGB 14.3 16.4  PLT 127* 146*    Basename 05/09/11 0505 05/08/11 1443  NA 139 137  K 3.9 4.0  CL 103 101  CO2 27 27  GLUCOSE 213* 121*  BUN 16 18  CREATININE 1.07 1.12    Basename 05/09/11 0505 05/08/11 2042  TROPONINI <0.30 <0.30   Hepatic Function Panel No results found for this basename: PROT,ALBUMIN,AST,ALT,ALKPHOS,BILITOT,BILIDIR,IBILI in the last 72 hours  Basename 05/09/11 0505  CHOL 130   No results found for this basename: PROTIME in the last 72 hours  Imaging: Dg Chest 2 View  05/08/2011  *RADIOLOGY REPORT*  Clinical Data: Mid to left chest pain.  Previous CABG. Hypertension and diabetes.  CHEST - 2 VIEW  Comparison: 09/07/2010  Findings: There has been previous median sternotomy and  CABG. Heart size is normal.  There is calcification of the aorta.  The lungs are clear.  The vascularity is normal.  No effusions. Ordinary degenerative changes effect the spine.  IMPRESSION: Previous CABG.  No active disease.  Original Report Authenticated By: Thomasenia Sales, M.D.    Cardiac Studies:  ECG:  Orders placed during the hospital encounter of 05/08/11  . EKG 12-LEAD  . EKG 12-LEAD  . EKG 12-LEAD     Telemetry:  NSR no arrythmia  Echo:   Medications:     . amLODipine  10 mg Oral Daily  . antiseptic oral rinse  15 mL Mouth Rinse BID  . aspirin  324 mg Oral NOW   Or  . aspirin  300 mg Rectal NOW  . aspirin  325 mg Oral Daily  . carvedilol  25 mg Oral BID WC  . cholecalciferol  1,000 Units Oral Daily  . citalopram  40 mg Oral Daily  . heparin  4,000 Units Intravenous Once  . HYDROcodone-acetaminophen      . insulin aspart  0-15 Units Subcutaneous TID WC  . insulin glargine  40 Units Subcutaneous Daily  . insulin glargine  50 Units Subcutaneous QHS  .  morphine injection  2 mg Intravenous Once  . multivitamins ther. w/minerals  1 tablet Oral Daily  . omega-3 acid ethyl esters  1 g Oral Daily  . pantoprazole  40 mg Oral Q1200  . Pramlintide Acetate  60 Units Subcutaneous BID AC  . simvastatin  20 mg Oral QHS  . sodium chloride  3 mL Intravenous Q12H  . DISCONTD: insulin glargine  40-50 Units Subcutaneous BID  . DISCONTD: Vitamin D3  2 tablet Oral Daily  . DISCONTD: Vitamin D3  2 tablet Oral Daily    Assessment/Plan:   Chest Pain:  Recent BMS to SVG with interuption for back surgery.  Cath today.   DM:  Will adjust [facility] Depression:  Celexa is 20 bid per [place].  Psych asked to see in hospital Charlton Haws 05/09/2011, 9:19 AM

## 2011-05-09 NOTE — Progress Notes (Signed)
Pt. bp 164/107 this morning. MD notified and aware. They will be rounding on him this morning. Will continue to monitor.

## 2011-05-09 NOTE — Progress Notes (Signed)
Pt. BP 188/103 MD notified and orders to increase nitro drip. Will continue to monitor.

## 2011-05-09 NOTE — Op Note (Signed)
Cardiac Catheterization Operative Report  Tyler Le [id number] 11/15/20125:56 PM Kaleen Mask, MD  Procedure Performed:  1. Left Heart Catheterization 2. Selective Coronary Angiography 3. SVG aniography 4. LIMA graft angiography 5. Left ventricular angiogram  Operator: Verne Carrow, MD  Indication: Chest pain. Known CAD with previous bypass surgery. He had a bare metal stent placed in the proximal portion of the SVG to the Ramus and OM1 in March 2012. He had back surgery and had dual antiplatelet therapy interrupted. He has had some chest pain over the last few weeks.                                      Procedure Details: The risks, benefits, complications, treatment options, and expected outcomes were discussed with the patient. The patient and/or family concurred with the proposed plan, giving informed consent. The patient was brought to the cath lab after IV hydration was begun and oral premedication was given. The patient was further sedated with Versed and Fentanyl. The right groin was prepped and draped in the usual manner. Using the modified Seldinger access technique, a 5 French sheath was placed in the right femoral artery. Standard diagnostic catheters were used to perform selective coronary angiography. Both vein grafts were engaged with the JR4 catheter. The LIMA graft was engaged with the JR4 catheter.  A pigtail catheter was used to perform a left ventricular angiogram. There were no immediate complications. The patient was taken to the recovery area in stable condition.   Coronary Interventions:  Hemodynamic Findings: Central aortic pressure: 139/82 Left ventricular pressure: 147/17/25  Angiographic Findings:  Left main: Distal 30% stenosis.   Left Anterior Descending Artery: Proximal 80% stenosis. 100% mid occlusion. The diagonal branch has a long segment of 90% proximal stenosis. There is competitive filling in the diagonal branch from the vein  graft. Mid and distal LAD fills from the LIMA graft.  Circumflex Artery: The circumflex has diffuse disease throughout the proximal and mid segment, 60% throughout. No change since last cath. The OM and Ramus are occluded at the ostium and fill from the vein graft.  Right Coronary Artery: 100% proximal occlusion. The vein graft to this vessel is known to be occluded.  Bypass grafts:  SVG to RCA is known to be occluded and is not selectively injected.  SVG sequential to OM1 and Ramus is patent. There is a patent stent in the proximal portion with 30% in stent restenosis. Both target vessels are diffusely diseased.    SVG to Diagonal is patent with diffuse 30% disease in the body of the SVG. The diagonal has severe disease after the anastamosis however it is small caliber.   LIMA to mid LAD is patent. There is a 50% stenosis in the LAD beyond the graft insertion. The LAD is small caliber distally and diffusely diseased.   Left Ventricular Angiogram: LVEF 55%.   Impression: 1. 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 The bare metal stent in the ostium of the SVG to the OM/Ramus is patent with mild in stent restenosis. The other vessels are all diffusely diseased and overall unchanged. There are multiple areas of potential ischemia but no good areas for PCI.  2. Normal LV systolic function.   Recommendations: Continue medical therapy.        Complications:  None; patient tolerated the procedure well.

## 2011-05-09 NOTE — Progress Notes (Signed)
ANTICOAGULATION CONSULT NOTE - Follow Up Consult  Pharmacy Consult for heparin  Indication: chest pain/ACS  Allergies  Allergen Reactions  . Altace Nausea Only  . Metformin And Related Nausea And Vomiting  . Pravachol Nausea And Vomiting  . Wellbutrin (Bupropion Hcl) Nausea And Vomiting    Patient Measurements: Height: 6\' 4"  (193 cm) Weight: 261 lb 3.9 oz (118.5 kg) IBW/kg (Calculated) : 86.8  Adjusted Body Weight:   Vital Signs: Temp: 98.2 F (36.8 C) (11/15 0634) BP: 164/107 mmHg (11/15 0634) Pulse Rate: 99  (11/15 0634)  Labs:  Basename 05/09/11 0940 05/09/11 0505 05/08/11 2336 05/08/11 2042 05/08/11 2005 05/08/11 1443  HGB -- 14.3 -- -- -- 16.4  HCT -- 41.1 -- -- -- 45.8  PLT -- 127* -- -- -- 146*  APTT -- -- -- -- 89* --  LABPROT -- -- -- -- 13.8 --  INR -- -- -- -- 1.04 --  HEPARINUNFRC 0.41 -- 0.29* -- -- --  CREATININE -- 1.07 -- -- -- 1.12  CKTOTAL -- 89 -- 92 -- 110  CKMB -- 2.0 -- 2.1 -- 2.2  TROPONINI -- <0.30 -- <0.30 -- --   Estimated Creatinine Clearance: 94.3 ml/min (by C-G formula based on Cr of 1.07).   Medications:  Prescriptions prior to admission  Medication Sig Dispense Refill  . ALPRAZolam (XANAX) 0.5 MG tablet Take 0.5 mg by mouth 4 (four) times daily as needed. For anxiety       . amLODipine (NORVASC) 5 MG tablet Take 5 mg by mouth daily.        Marland Kitchen aspirin 325 MG tablet Take 325 mg by mouth daily.       . carvedilol (COREG) 25 MG tablet Take 25 mg by mouth 2 (two) times daily with a meal.        . Cholecalciferol (VITAMIN D3) 2000 UNITS TABS Take 2 tablets by mouth daily.        . citalopram (CELEXA) 40 MG tablet Take 40 mg by mouth daily.        . ergocalciferol (VITAMIN D2) 50000 UNITS capsule Take 2,000 Units by mouth daily.       Marland Kitchen HYDROcodone-acetaminophen (NORCO) 10-325 MG per tablet Take 1 tablet by mouth every 4 (four) hours as needed. For pain       . insulin aspart protamine-insulin aspart (NOVOLOG 70/30) (70-30) 100 UNIT/ML  injection Inject 40-50 Units into the skin 2 (two) times daily with a meal. Take 50 units in the morning and 40 units in the morning       . isosorbide mononitrate (IMDUR) 30 MG 24 hr tablet Take 30 mg by mouth at bedtime. Take whole tablet daily at night       . lisinopril (PRINIVIL,ZESTRIL) 5 MG tablet Take 2.5 mg by mouth daily.        . Multiple Vitamin (MULTIVITAMIN) tablet Take 1 tablet by mouth daily.       . Omega-3 Fatty Acids (FISH OIL) 1000 MG CPDR Take 1 capsule by mouth daily.       Marland Kitchen omeprazole (PRILOSEC) 20 MG capsule Take 20 mg by mouth 2 (two) times daily before a meal.        . Pramlintide Acetate (SYMLINPEN 120) 2700 MCG/2.7ML SOLN Inject 60 Units into the skin 2 (two) times daily before a meal.        . simvastatin (ZOCOR) 40 MG tablet Take 20 mg by mouth at bedtime.        Marland Kitchen  zolpidem (AMBIEN) 10 MG tablet Take 10 mg by mouth at bedtime as needed. For insomnia        Assessment: 65 yo M on IV heparin for r/o ACS. Heparin level is at-goal. Plan for cardiac cath today.  Goal of Therapy:  Heparin level 0.3-0.7 units/ml   Plan:  1. Continue heparin IV infusion at 1650 units/hr.  2. F/u post-cath.   Emeline Gins 05/09/2011,11:08 AM

## 2011-05-09 NOTE — Interval H&P Note (Signed)
History and Physical Interval Note:   05/09/2011  I have reviewed the History and Physical and there are no changes. The patient was admitted with chest pain. Planned diagnostic left heart cath with possible PCI. R/B reviewed.   Tyler Le 11/15/20125:14 PM 5:10 PM  [name]  MD

## 2011-05-09 NOTE — Progress Notes (Signed)
@ Subjective:  Denies SSCP, palpitations or Dyspnea Back pain and depression  Objective:  Vital Signs in the last 24 hours:       Wt Readings from Last 1 Encounters:  05/09/11 118.5 kg (261 lb 3.9 oz)    Temp:  [97.5 F (36.4 C)-98.8 F (37.1 C)] 98.2 F (36.8 C) (11/15 0634) Pulse Rate:  [81-99] 99  (11/15 0634) Resp:  [13-26] 20  (11/15 0634) BP: (76-188)/(56-107) 164/107 mmHg (11/15 0634) SpO2:  [96 %-99 %] 96 % (11/15 0634) Weight:  [118.2 kg (260 lb 9.3 oz)-118.5 kg (261 lb 3.9 oz)] 261 lb 3.9 oz (118.5 kg) (11/15 0215)  Intake/Output from previous day: 11/14 0701 - 11/15 0700 In: 567.6 [P.O.:60; I.V.:507.6] Out: -  Intake/Output from this shift:    Physical Exam: Affect appropriate Healthy:  appears stated age HEENT: normal Neck supple with no adenopathy JVP normal no bruits no thyromegaly Lungs clear with no wheezing and good diaphragmatic motion Heart:  S1/S2 no murmur,rub, gallop or click PMI normal Abdomen: benighn, BS positve, no tenderness, no AAA no bruit.  No HSM or HJR Distal pulses intact with no bruits No edema Neuro non-focal Skin warm and dry No muscular weakness   Lab Results:  Basename 05/09/11 0505 05/08/11 1443  WBC 6.0 6.9  HGB 14.3 16.4  PLT 127* 146*    Basename 05/09/11 0505 05/08/11 1443  NA 139 137  K 3.9 4.0  CL 103 101  CO2 27 27  GLUCOSE 213* 121*  BUN 16 18  CREATININE 1.07 1.12    Basename 05/09/11 0505 05/08/11 2042  TROPONINI <0.30 <0.30   Hepatic Function Panel No results found for this basename: PROT,ALBUMIN,AST,ALT,ALKPHOS,BILITOT,BILIDIR,IBILI in the last 72 hours  Basename 05/09/11 0505  CHOL 130   No results found for this basename: PROTIME in the last 72 hours  Imaging: Dg Chest 2 View  05/08/2011  *RADIOLOGY REPORT*  Clinical Data: Mid to left chest pain.  Previous CABG. Hypertension and diabetes.  CHEST - 2 VIEW  Comparison: 09/07/2010  Findings: There has been previous median sternotomy and  CABG. Heart size is normal.  There is calcification of the aorta.  The lungs are clear.  The vascularity is normal.  No effusions. Ordinary degenerative changes effect the spine.  IMPRESSION: Previous CABG.  No active disease.  Original Report Authenticated By: MARK E. SHOGRY, M.D.    Cardiac Studies:  ECG:  Orders placed during the hospital encounter of 05/08/11  . EKG 12-LEAD  . EKG 12-LEAD  . EKG 12-LEAD     Telemetry:  NSR no arrythmia  Echo:   Medications:     . amLODipine  10 mg Oral Daily  . antiseptic oral rinse  15 mL Mouth Rinse BID  . aspirin  324 mg Oral NOW   Or  . aspirin  300 mg Rectal NOW  . aspirin  325 mg Oral Daily  . carvedilol  25 mg Oral BID WC  . cholecalciferol  1,000 Units Oral Daily  . citalopram  40 mg Oral Daily  . heparin  4,000 Units Intravenous Once  . HYDROcodone-acetaminophen      . insulin aspart  0-15 Units Subcutaneous TID WC  . insulin glargine  40 Units Subcutaneous Daily  . insulin glargine  50 Units Subcutaneous QHS  .  morphine injection  2 mg Intravenous Once  . multivitamins ther. w/minerals  1 tablet Oral Daily  . omega-3 acid ethyl esters  1 g Oral Daily  . pantoprazole    40 mg Oral Q1200  . Pramlintide Acetate  60 Units Subcutaneous BID AC  . simvastatin  20 mg Oral QHS  . sodium chloride  3 mL Intravenous Q12H  . DISCONTD: insulin glargine  40-50 Units Subcutaneous BID  . DISCONTD: Vitamin D3  2 tablet Oral Daily  . DISCONTD: Vitamin D3  2 tablet Oral Daily    Assessment/Plan:   Chest Pain:  Recent BMS to SVG with interuption for back surgery.  Cath today.   DM:  Will adjust l[facility] Depression:  Celexa [facility].  Psych asked to see in hospital Peter Nishan 05/09/2011, 9:19 AM      

## 2011-05-09 NOTE — Consult Note (Signed)
Patient Identification:  Tyler Le Date of Evaluation:  05/09/2011   History of Present Illness: 65 year old male, with long history of depression, tried on number of medication, Paxil, Prozac, Celexa, and they did not help him much and made him more sick.  Patient reported that he has always been an active person, but after the bypass in 2001, he is staying at home and that makes him very depressed.  On top of that, he has a strong family history of depression.  Sister has history of  MDD per report,   He reports sleep and appetite poor.  Patient has suicidal thoughts on and off, but has never acted on them.  Was admitted to  [facility] years ago for depression, but did not like the groups there, thus does not want to be admitted in the inpatient setting.   He denied any relationship issues with the wife or any financial stress.       Past Medical History:     Past Medical History  Diagnosis Date  . Hypertension   . Chronic kidney disease     nephrolithiasis  . Status post coronary artery stent placement March 2012    BMS to SVG to intermediate and OM  . Dyslipidemia   . Obesities, morbid   . Back pain   . Coronary artery disease 2001    CABG X4  . S/P coronary artery stent placement 08/2010  . Complication of anesthesia     "they have a hard time waking him up"  . Angina   . Dysrhythmia   . Shortness of breath 05/08/11    "@ rest and w/exertion"  . Blood transfusion   . Anemia   . GERD (gastroesophageal reflux disease)   . Depression   . Anxiety   . Diabetes mellitus        Past Surgical History  Procedure Date  . Coronary stent placement March 2012    BMS to SVG to intermediate/OM  . Coronary artery bypass graft 2001  . Appendectomy ~ 1955  . Lumbar laminectomy 01/2011  . Back surgery   . Cholecystectomy   . Rotator cuff repair     left  . Coronary angioplasty     Allergies:  Allergies  Allergen Reactions  . Altace Nausea Only  . Metformin And Related  Nausea And Vomiting  . Pravachol Nausea And Vomiting  . Wellbutrin (Bupropion Hcl) Nausea And Vomiting    Current Medications:  Prior to Admission medications   Medication Sig Start Date End Date Taking? Authorizing Provider  ALPRAZolam Prudy Feeler) 0.5 MG tablet Take 0.5 mg by mouth 4 (four) times daily as needed. For anxiety  05/01/11  Yes Cassell Clement, MD  amLODipine (NORVASC) 5 MG tablet Take 5 mg by mouth daily.   11/29/10  Yes Cassell Clement, MD  aspirin 325 MG tablet Take 325 mg by mouth daily.    Yes Historical Provider, MD  carvedilol (COREG) 25 MG tablet Take 25 mg by mouth 2 (two) times daily with a meal.     Yes Historical Provider, MD  Cholecalciferol (VITAMIN D3) 2000 UNITS TABS Take 2 tablets by mouth daily.     Yes Historical Provider, MD  citalopram (CELEXA) 40 MG tablet Take 40 mg by mouth daily.     Yes Historical Provider, MD  ergocalciferol (VITAMIN D2) 50000 UNITS capsule Take 2,000 Units by mouth daily.    Yes Historical Provider, MD  HYDROcodone-acetaminophen (NORCO) 10-325 MG per tablet Take 1  tablet by mouth every 4 (four) hours as needed. For pain    Yes Historical Provider, MD  insulin aspart protamine-insulin aspart (NOVOLOG 70/30) (70-30) 100 UNIT/ML injection Inject 40-50 Units into the skin 2 (two) times daily with a meal. Take 50 units in the morning and 40 units in the morning    Yes Historical Provider, MD  isosorbide mononitrate (IMDUR) 30 MG 24 hr tablet Take 30 mg by mouth at bedtime. Take whole tablet daily at night  01/15/11  Yes [name], NP  lisinopril (PRINIVIL,ZESTRIL) 5 MG tablet Take 2.5 mg by mouth daily.     Yes Historical Provider, MD  Multiple Vitamin (MULTIVITAMIN) tablet Take 1 tablet by mouth daily.    Yes Historical Provider, MD  Omega-3 Fatty Acids (FISH OIL) 1000 MG CPDR Take 1 capsule by mouth daily.    Yes Historical Provider, MD  omeprazole (PRILOSEC) 20 MG capsule Take 20 mg by mouth 2 (two) times daily before a meal.     Yes  Historical Provider, MD  Pramlintide Acetate (SYMLINPEN 120) 2700 MCG/2.7ML SOLN Inject 60 Units into the skin 2 (two) times daily before a meal.     Yes Historical Provider, MD  simvastatin (ZOCOR) 40 MG tablet Take 20 mg by mouth at bedtime.     Yes Historical Provider, MD  zolpidem (AMBIEN) 10 MG tablet Take 10 mg by mouth at bedtime as needed. For insomnia   Yes Historical Provider, MD    Social History:    reports that he has quit smoking. His smoking use included Cigarettes. He has a 100 pack-year smoking history. He has quit using smokeless tobacco. His smokeless tobacco use included Chew. He reports that he does not drink alcohol or use illicit drugs.   Family History:    Family History  Problem Relation Age of Onset  . Heart disease Mother   . Heart disease Father   . Heart attack Father   . Heart disease Brother     Mental Status Examination/Evaluation: Objective:  Appearance: Fairly Groomed  Psychomotor Activity:  Normal  Eye Contact::  Good  Speech:  Normal Rate  Volume:  Normal  Mood:  depressed  Affect:  Congruent  Thought Process:  Logical and goal directed  Orientation:  Full  Thought Content:  Not hallucinating or delusional  Suicidal Thoughts:  No  Homicidal Thoughts:  No  Judgement:  Intact  Insight:  Good    Assessment:   AXIS I MDD recurrent type, without psychotic symptoms  AXIS II  Deffered  AXIS III See medical notes.  AXIS IV Long history of depression, medical problems  AXIS V 51-60 moderate symptoms     Recommendations: 1.  I discussed with him about medications and there side effects, risks, and benefits.  Patient agrees to take Wellbutrin 100mg  po daily. 2.  Continue Celexa 40mg  po daily. 3.  Patient will get benefit from counseling in the outpatient setting, since he was responding well to the counseling.   4.  I will follow up on this patient as needed. 5.  I spoke with the son, he does not have any safety concerns about this patient  at this time. 6.  Patient does not meet criteria for inpatient admission.    Eulogio Ditch, MD 11/6/201211:40 AM

## 2011-05-09 NOTE — Progress Notes (Signed)
ANTICOAGULATION CONSULT NOTE - Follow Up Consult  Pharmacy Consult for heparin  Indication: chest pain/ACS  Allergies  Allergen Reactions  . Altace Nausea Only  . Metformin And Related Nausea And Vomiting  . Pravachol Nausea And Vomiting  . Wellbutrin (Bupropion Hcl) Nausea And Vomiting    Patient Measurements: Height: 6\' 4"  (193 cm) Weight: 260 lb 9.3 oz (118.2 kg) IBW/kg (Calculated) : 86.8  Adjusted Body Weight:   Vital Signs: Temp: 98.4 F (36.9 C) (11/14 2057) Temp src: Tympanic (11/14 2057) BP: 185/105 mmHg (11/14 2057) Pulse Rate: 89  (11/14 2057)  Labs:  Basename 05/08/11 2336 05/08/11 2042 05/08/11 2005 05/08/11 1443  HGB -- -- -- 16.4  HCT -- -- -- 45.8  PLT -- -- -- 146*  APTT -- -- 89* --  LABPROT -- -- 13.8 --  INR -- -- 1.04 --  HEPARINUNFRC 0.29* -- -- --  CREATININE -- -- -- 1.12  CKTOTAL -- 92 -- 110  CKMB -- 2.1 -- 2.2  TROPONINI -- <0.30 -- --   Estimated Creatinine Clearance: 90 ml/min (by C-G formula based on Cr of 1.12).   Medications:  Prescriptions prior to admission  Medication Sig Dispense Refill  . ALPRAZolam (XANAX) 0.5 MG tablet Take 0.5 mg by mouth 4 (four) times daily as needed. For anxiety       . amLODipine (NORVASC) 5 MG tablet Take 5 mg by mouth daily.        Marland Kitchen aspirin 325 MG tablet Take 325 mg by mouth daily.       . carvedilol (COREG) 25 MG tablet Take 25 mg by mouth 2 (two) times daily with a meal.        . Cholecalciferol (VITAMIN D3) 2000 UNITS TABS Take 2 tablets by mouth daily.        . citalopram (CELEXA) 40 MG tablet Take 40 mg by mouth daily.        . ergocalciferol (VITAMIN D2) 50000 UNITS capsule Take 2,000 Units by mouth daily.       Marland Kitchen HYDROcodone-acetaminophen (NORCO) 10-325 MG per tablet Take 1 tablet by mouth every 4 (four) hours as needed. For pain       . insulin aspart protamine-insulin aspart (NOVOLOG 70/30) (70-30) 100 UNIT/ML injection Inject 40-50 Units into the skin 2 (two) times daily with a meal. Take  50 units in the morning and 40 units in the morning       . isosorbide mononitrate (IMDUR) 30 MG 24 hr tablet Take 30 mg by mouth at bedtime. Take whole tablet daily at night       . lisinopril (PRINIVIL,ZESTRIL) 5 MG tablet Take 2.5 mg by mouth daily.        . Multiple Vitamin (MULTIVITAMIN) tablet Take 1 tablet by mouth daily.       . Omega-3 Fatty Acids (FISH OIL) 1000 MG CPDR Take 1 capsule by mouth daily.       Marland Kitchen omeprazole (PRILOSEC) 20 MG capsule Take 20 mg by mouth 2 (two) times daily before a meal.        . Pramlintide Acetate (SYMLINPEN 120) 2700 MCG/2.7ML SOLN Inject 60 Units into the skin 2 (two) times daily before a meal.        . simvastatin (ZOCOR) 40 MG tablet Take 20 mg by mouth at bedtime.        Marland Kitchen zolpidem (AMBIEN) 10 MG tablet Take 10 mg by mouth at bedtime as needed. For insomnia  Assessment: 65 yo on heparin for ACS.  Level < goal Goal of Therapy:  Heparin level 0.3-0.7 units/ml   Plan:  infusion rate 1650 units/hr  Check level in 6 hours  Kavi Almquist P[place]et 05/09/2011,1:34 AM

## 2011-05-10 ENCOUNTER — Other Ambulatory Visit: Payer: Self-pay

## 2011-05-10 LAB — GLUCOSE, CAPILLARY: Glucose-Capillary: 176 mg/dL — ABNORMAL HIGH (ref 70–99)

## 2011-05-10 MED ORDER — ASPIRIN 81 MG PO TABS: 81.0000 mg | ORAL_TABLET | Freq: Every day | ORAL | Status: DC

## 2011-05-10 MED ORDER — NITROGLYCERIN 0.4 MG SL SUBL: 0.4000 mg | SUBLINGUAL_TABLET | SUBLINGUAL | Status: DC | PRN

## 2011-05-10 MED ORDER — AMLODIPINE BESYLATE 10 MG PO TABS: 10.0000 mg | ORAL_TABLET | Freq: Every day | ORAL | Status: DC

## 2011-05-10 MED ORDER — CITALOPRAM HYDROBROMIDE 40 MG PO TABS: 40.0000 mg | ORAL_TABLET | Freq: Every day | ORAL | Status: DC

## 2011-05-10 NOTE — Discharge Summary (Signed)
Patient seen and examined on rounds.  Agree with data in D/C summary Charlton Haws 05/10/2011 10:20 AM

## 2011-05-10 NOTE — Progress Notes (Signed)
@ Subjective:  Denies SSCP, palpitations or Dyspnea No comlaints.  Still depressed and chronic back pain  Objective:  Vital Signs in the last 24 hours:       Wt Readings from Last 1 Encounters:  05/10/11 120.1 kg (264 lb 12.4 oz)    Temp:  [98 F (36.7 C)-98.5 F (36.9 C)] 98.2 F (36.8 C) (11/16 0318) Pulse Rate:  [78-87] 80  (11/16 0318) Resp:  [20-22] 22  (11/16 0318) BP: (114-145)/(70-84) 132/77 mmHg (11/16 0318) SpO2:  [95 %] 95 % (11/16 0318) Weight:  [120.1 kg (264 lb 12.4 oz)] 264 lb 12.4 oz (120.1 kg) (11/16 0318)  Intake/Output from previous day: 11/15 0701 - 11/16 0700 In: 1365 [I.V.:1365] Out: -  Intake/Output from this shift:    Physical Exam: General appearance: alert and morbidly obese Neck: no adenopathy, no carotid bruit, no JVD, supple, symmetrical, trachea midline and thyroid not enlarged, symmetric, no tenderness/mass/nodules Lungs: clear to auscultation bilaterally and normal percussion bilaterally Heart: regular rate and rhythm, S1, S2 normal, no murmur, click, rub or gallop Abdomen: soft, non-tender; bowel sounds normal; no masses,  no organomegaly Pulses: 2+ and symmetric  Lab Results:  Basename 05/09/11 0505 05/08/11 1443  WBC 6.0 6.9  HGB 14.3 16.4  PLT 127* 146*    Basename 05/09/11 0505 05/08/11 1443  NA 139 137  K 3.9 4.0  CL 103 101  CO2 27 27  GLUCOSE 213* 121*  BUN 16 18  CREATININE 1.07 1.12    Basename 05/09/11 1222 05/09/11 0505  TROPONINI <0.30 <0.30   Hepatic Function Panel No results found for this basename: PROT,ALBUMIN,AST,ALT,ALKPHOS,BILITOT,BILIDIR,IBILI in the last 72 hours  Basename 05/09/11 0505  CHOL 130   No results found for this basename: PROTIME in the last 72 hours  Imaging: Dg Chest 2 View  05/08/2011  *RADIOLOGY REPORT*  Clinical Data: Mid to left chest pain.  Previous CABG. Hypertension and diabetes.  CHEST - 2 VIEW  Comparison: 09/07/2010  Findings: There has been previous median sternotomy and  CABG. Heart size is normal.  There is calcification of the aorta.  The lungs are clear.  The vascularity is normal.  No effusions. Ordinary degenerative changes effect the spine.  IMPRESSION: Previous CABG.  No active disease.  Original Report Authenticated By: Thomasenia Sales, M.D.    Cardiac Studies:  ECG:  Orders placed during the hospital encounter of 05/08/11  . EKG 12-LEAD  . EKG 12-LEAD  . EKG 12-LEAD     Telemetry:  Echo:   Medications:     . amLODipine  10 mg Oral Daily  . antiseptic oral rinse  15 mL Mouth Rinse BID  . aspirin  81 mg Oral Daily  . buPROPion  100 mg Oral Daily  . carvedilol  25 mg Oral BID WC  . cholecalciferol  1,000 Units Oral Daily  . citalopram  20 mg Oral BID  . fentaNYL      . heparin      . insulin aspart  0-15 Units Subcutaneous TID WC  . insulin glargine  20 Units Subcutaneous Once  . insulin glargine  40 Units Subcutaneous QHS  . lidocaine      . midazolam      . multivitamins ther. w/minerals  1 tablet Oral Daily  . nitroGLYCERIN      . omega-3 acid ethyl esters  1 g Oral Daily  . pantoprazole  40 mg Oral Q1200  . Pramlintide Acetate  60 Units Subcutaneous BID AC  .  simvastatin  20 mg Oral QHS  . sodium chloride  3 mL Intravenous Q12H  . DISCONTD: aspirin  325 mg Oral Daily  . DISCONTD: citalopram  40 mg Oral Daily  . DISCONTD: insulin glargine  40 Units Subcutaneous Daily  . DISCONTD: insulin glargine  50 Units Subcutaneous QHS  . DISCONTD: insulin glargine  50 Units Subcutaneous Daily    Assessment/Plan:  Chest Pain:  4/5 grafts patent including recently placed stents.  Continue ASA.  F/U [place] Depression:  [facility]  Continue cetalopram bid DM:  Encouraged him to see Dr Sharl Ma  Needs better F/U than he seems to be getting at [facility] 05/10/2011, 8:14 AM

## 2011-05-10 NOTE — Discharge Summary (Signed)
Physician Discharge Summary  Patient ID: Tyler Le,  MRN: [id number], DOB/AGE: 67-Oct-1947 67 y.o.  Admit date: 05/08/2011 Discharge date: 05/10/2011  Primary Discharge Diagnoses:  1. Chest pain/unstable angina with cath this admission 05/09/11 showing 4/5 patent grafts and patent BMS with otherwise diffuse disease and multiple areas of potential ischemia but no good areas for PCI, for continued med rx. 2. CAD  - cath as noted above 05/09/11  - S/P CABG (coronary artery bypass graft) x 4 (lima-lad; vg-ri-om;  vg-rca)  2001    - cath 08/2010: native 3vd - w patent lima-lad, occl vg-rca, and 99p stenosis is vg-ri-om.    vg-ri-om was treated w 4.0 x 23mm vision bms  March 2012   3. HTN 4. Depression 5. Dyslipdemia 6. Insulin dependent diabetes mellitus   Secondary Discharge Diagnoses:  1. Chronic kidney disease - nephrolithiasis   2. Obesity 3. Back pain  4. History of appendectomy  Hospital Course: 65 y/o M with history of CAD, obesity, dyslipidemia, DM admitetd with 2-3 week history of rest and exertional CP similar to prior angina associated with SOB and relieved with SL NTG, approximately 3-4 times a day. He had an episode while walking in [employer] and another on day of admission prompting him to go to ER. EKG was nonacute and initial troponins were negative x 1. His chest pain was felt consistent with prior angina. He was placed on IV heparin and admitted. Cardiac enzymes were cycled and remained negative.  He underwent cath yesterday showing severe triple vessel CAD s/p 5 V CABG with 4/5 patent grafts. The bare metal stent in the ostium of the SVG to the OM/Ramus was patent with mild in stent restenosis. The other vessels were all diffusely diseased and overall unchanged. There were multiple areas of potential ischemia but no good areas for PCI. Dr. Clifton James recommended med rx, and Dr. Eden Emms agrees with this assessment.  He also complained of depression this admission over the past 6  weeks, with some concern for suicidal thoughts. Celexa had been added as an outpatient. He was seen by Dr. Rogers Blocker with psychiatry; please see their consult. Wellbutrin 100mg  along with continued dosing of Celexa was recommended. However, Wellbutrin was listed as an allergy on the patient's medicine list and I confirmed this with him - he does not want to be prescribed it at discharge. I also confirmed personally with him that he takes both Novolog 40-50 units bid as well as Lantus 60 units bid. Please also note pharmacy left a recommendation re: max dose of Celexa. I have educated the patient on this and he will be instructed to call his primary [facility] provider to discuss dosing. He will be instructed to follow up closely with his outpatient resources. Dr. Eden Emms has seen and examined the patient today and feels he is stable for discharge.   Discharge Vitals: Blood pressure 140/62, pulse 80, temperature 98.2 F (36.8 C), temperature source Tympanic, resp. rate 22, height 6\' 4"  (1.93 m), weight 264 lb 12.4 oz (120.1 kg), SpO2 95.00%.  Labs: Lab Results  Component Value Date   WBC 6.0 05/09/2011   HGB 14.3 05/09/2011   HCT 41.1 05/09/2011   MCV 85.6 05/09/2011   PLT 127* 05/09/2011    Lab 05/09/11 0505  NA 139  K 3.9  CL 103  CO2 27  BUN 16  CREATININE 1.07  CALCIUM 9.5  PROT --  BILITOT --  ALKPHOS --  ALT --  AST --  GLUCOSE 213*  Basename 05/09/11 1222 05/09/11 0505 05/08/11 2042 05/08/11 1443  CKTOTAL 86 89 92 110  CKMB 2.0 2.0 2.1 2.2  TROPONINI <0.30 <0.30 <0.30 --   Lab Results  Component Value Date   CHOL 130 05/09/2011   HDL 27* 05/09/2011   LDLCALC 31 05/09/2011   TRIG 359* 05/09/2011     Diagnostic Studies/Procedures:  1. Cardiac catheterization this admission, please see full report and above for summary 2. 05/08/2011  CHEST - 2 VIEW IMPRESSION: Previous CABG.  No active disease.    Discharge Medications:  Current Discharge Medication List    START  taking these medications   Details  nitroGLYCERIN (NITROSTAT) 0.4 MG SL tablet Place 1 tablet (0.4 mg total) under the tongue every 5 (five) minutes as needed for chest pain (up to 3 doses). Qty: 25 tablet, Refills: 4      CONTINUE these medications which have CHANGED   Details  amLODipine (NORVASC) 10 MG tablet Take 1 tablet (10 mg total) by mouth daily. Qty: 30 tablet, Refills: 3    aspirin 81 MG tablet Take 1 tablet (81 mg total) by mouth daily.    citalopram (CELEXA) 40 MG tablet Take 1 tablet (40 mg total) by mouth daily. The FDA recommends that no more than 20mg  be prescribed daily to patients over the age of 41. Please call your [facility] doctor that prescribed this medicine today to discuss whether or not you should continue this dose.       CONTINUE these medications which have NOT CHANGED   Details  ALPRAZolam (XANAX) 0.5 MG tablet Take 0.5 mg by mouth 4 (four) times daily as needed. For anxiety     carvedilol (COREG) 25 MG tablet Take 25 mg by mouth 2 (two) times daily with a meal.      Cholecalciferol (VITAMIN D3) 2000 UNITS TABS Take 2 tablets by mouth daily.      HYDROcodone-acetaminophen (NORCO) 10-325 MG per tablet Take 1 tablet by mouth every 4 (four) hours as needed. For pain     insulin aspart protamine-insulin aspart (NOVOLOG 70/30) (70-30) 100 UNIT/ML injection Inject 40-50 Units into the skin 2 (two) times daily with a meal. Take 50 units in the morning and 40 units in the morning     insulin glargine (LANTUS) 100 UNIT/ML injection Inject 60 Units into the skin 2 (two) times daily.      isosorbide mononitrate (IMDUR) 30 MG 24 hr tablet Take 30 mg by mouth at bedtime. Take whole tablet daily at night     lisinopril (PRINIVIL,ZESTRIL) 5 MG tablet Take 2.5 mg by mouth daily.      Multiple Vitamin (MULTIVITAMIN) tablet Take 1 tablet by mouth daily.     Omega-3 Fatty Acids (FISH OIL) 1000 MG CPDR Take 1 capsule by mouth daily.     omeprazole (PRILOSEC) 20 MG capsule  Take 20 mg by mouth 2 (two) times daily before a meal.      Pramlintide Acetate (SYMLINPEN 120) 2700 MCG/2.7ML SOLN Inject 60 Units into the skin 2 (two) times daily before a meal.      simvastatin (ZOCOR) 40 MG tablet Take 20 mg by mouth at bedtime.      zolpidem (AMBIEN) 10 MG tablet Take 10 mg by mouth at bedtime as needed. For insomnia      See above. I personally verified the patient's medications including insulin with him directly.  Disposition:  The patient will be discharged in stable condition to home. Discharge Orders  Future Appointments: Provider: Department: Dept Phone: Center:   06/03/2011 9:00 AM Cassell Clement, MD [facility] [phone] None   06/03/2011 9:30 AM Lorenda Cahill [facility] [phone] [facility]     Future Orders Please Complete By Expires   Diet - low sodium heart healthy      Comments:   Diabetic diet   Increase activity slowly      Comments:   No driving for 3 days. No lifting over 5 lbs for 1 week. No sexual activity for 1 week. You may return to work in 1 week.     Discharge wound care:      Comments:   Keep procedure site clean & dry. If you notice increased pain, swelling, bleeding or pus, call/return!  You may shower, but no soaking baths/hot tubs/pools for 1 week.       Follow-up Information    Follow up with Cassell Clement, MD. (06/03/11 - Please arrive at 8:45 for your labwork and you will see Dr. Patty Sermons afterwards)    Contact information:   [address] [place], [place] [address] [phone]         Duration of Discharge Encounter: Greater than 30 minutes including physician and PA time.  Signed, Ronie Spies PA-C 05/10/2011, 9:58 AM

## 2011-05-10 NOTE — Progress Notes (Signed)
Clinical Social Work: Engineer, technical sales.  Patient Mental Status and Follow up: Patient lying in bed with wife at bedside. Reports no side effects from the medication and has a good night sleep and appetite is good. Patient alert and oriented to time place and situation.  Patient reports he is going home today, still depressed because he has been so sick, however he reports he will be compliant with medications, resume gym workouts and also follow up with outpatient counseling.  No current SI, HI or psychosis reported.  Family Collateral Information:  Patient wife steps out of room and reports patient doing all right at home, however has been every depressed over the last few months, mainly from being so sick, not being as active, and stuck in the house.  Wife reports no safety concerns, patient has been compliant with medications up until he started taking Prozac, which was perscribed from the [facility] in [place].  Wife not happy with side effects: weight loss, no sleep/restlessness, and increased depression.  Wife took patient to [facility] but reports they did nothing so she would like a referral for outside providers for counseling.  Patient agreeable and also agreeable to follow up.  CSW Disposition:  Patient will be referred to [facility] and given list of resources for follow up in case unhappy with other providers.  CSW also contracted for safety with patient and gave crisis line in case of emergency.  Wife agreeable to take patient  Home and plan is today.  No other needs or safety concerns.  No inpatient needed at this time.   CSW signing off.  Ashley Jacobs, MSW LCSWA [phone]  [phone]

## 2011-05-15 ENCOUNTER — Encounter (HOSPITAL_COMMUNITY): Payer: Self-pay | Admitting: Emergency Medicine

## 2011-05-15 ENCOUNTER — Emergency Department (HOSPITAL_COMMUNITY)
Admission: EM | Admit: 2011-05-15 | Discharge: 2011-05-21 | Disposition: A | Discharge status: Home / Self Care | Payer: Medicare Other | Attending: Emergency Medicine | Admitting: Emergency Medicine

## 2011-05-15 DIAGNOSIS — F329 Major depressive disorder, single episode, unspecified: Secondary | ICD-10-CM

## 2011-05-15 DIAGNOSIS — F3289 Other specified depressive episodes: Secondary | ICD-10-CM | POA: Insufficient documentation

## 2011-05-15 DIAGNOSIS — N189 Chronic kidney disease, unspecified: Secondary | ICD-10-CM | POA: Insufficient documentation

## 2011-05-15 DIAGNOSIS — I129 Hypertensive chronic kidney disease with stage 1 through stage 4 chronic kidney disease, or unspecified chronic kidney disease: Secondary | ICD-10-CM | POA: Insufficient documentation

## 2011-05-15 DIAGNOSIS — I2510 Atherosclerotic heart disease of native coronary artery without angina pectoris: Secondary | ICD-10-CM | POA: Insufficient documentation

## 2011-05-15 DIAGNOSIS — E119 Type 2 diabetes mellitus without complications: Secondary | ICD-10-CM | POA: Insufficient documentation

## 2011-05-15 DIAGNOSIS — E785 Hyperlipidemia, unspecified: Secondary | ICD-10-CM | POA: Insufficient documentation

## 2011-05-15 DIAGNOSIS — Z794 Long term (current) use of insulin: Secondary | ICD-10-CM | POA: Insufficient documentation

## 2011-05-15 DIAGNOSIS — Z9861 Coronary angioplasty status: Secondary | ICD-10-CM | POA: Insufficient documentation

## 2011-05-15 DIAGNOSIS — D649 Anemia, unspecified: Secondary | ICD-10-CM | POA: Insufficient documentation

## 2011-05-15 DIAGNOSIS — R45851 Suicidal ideations: Secondary | ICD-10-CM | POA: Insufficient documentation

## 2011-05-15 DIAGNOSIS — F17200 Nicotine dependence, unspecified, uncomplicated: Secondary | ICD-10-CM | POA: Insufficient documentation

## 2011-05-15 DIAGNOSIS — Z79899 Other long term (current) drug therapy: Secondary | ICD-10-CM | POA: Insufficient documentation

## 2011-05-15 DIAGNOSIS — Z951 Presence of aortocoronary bypass graft: Secondary | ICD-10-CM | POA: Insufficient documentation

## 2011-05-15 DIAGNOSIS — F411 Generalized anxiety disorder: Secondary | ICD-10-CM | POA: Insufficient documentation

## 2011-05-15 DIAGNOSIS — K219 Gastro-esophageal reflux disease without esophagitis: Secondary | ICD-10-CM | POA: Insufficient documentation

## 2011-05-15 LAB — RAPID URINE DRUG SCREEN, HOSP PERFORMED
Barbiturates: NOT DETECTED
Benzodiazepines: POSITIVE — AB

## 2011-05-15 NOTE — ED Notes (Signed)
Pt states he is depressed and has been feeling that way for a couple months  Pt has been treated by his PCP and was started on Celexa then the dr at the [facility] stopped it and started him on fluoxitine that caused his sxs to get worse  His PCP stopped that and restarted the Celexa a couple weeks ago  Pt also takes Xanax but pt states he is just not feeling any better

## 2011-05-15 NOTE — ED Notes (Signed)
Can't get appt w/a psychiatrist until Jan and can't wait until then.  Increasing depression.  Depression meds not helping.  No appetite.  Feels suicidal w/o a plan.  Denies HI.

## 2011-05-16 LAB — GLUCOSE, CAPILLARY
Glucose-Capillary: 247 mg/dL — ABNORMAL HIGH (ref 70–99)
Glucose-Capillary: 165 mg/dL — ABNORMAL HIGH (ref 70–99)

## 2011-05-16 LAB — ETHANOL: Alcohol, Ethyl (B): <11 mg/dL (ref 0–11)

## 2011-05-16 LAB — URINALYSIS, ROUTINE W REFLEX MICROSCOPIC
Leukocytes, UA: NEGATIVE
Nitrite: NEGATIVE
Specific Gravity, Urine: 1.029 (ref 1.005–1.030)
Urobilinogen, UA: 1 mg/dL (ref 0.0–1.0)

## 2011-05-16 LAB — CBC
HCT: 46.7 % (ref 39.0–52.0)
Platelets: 153 10*3/uL (ref 150–400)
RDW: 12.9 % (ref 11.5–15.5)
WBC: 7.9 10*3/uL (ref 4.0–10.5)

## 2011-05-16 LAB — BASIC METABOLIC PANEL
BUN: 16 mg/dL (ref 6–23)
Chloride: 99 mEq/L (ref 96–112)
GFR calc Af Amer: 81 mL/min — ABNORMAL LOW (ref >90)
Potassium: 4.2 mEq/L (ref 3.5–5.1)

## 2011-05-16 MED ORDER — LORAZEPAM 1 MG PO TABS
1.0000 mg | ORAL_TABLET | Freq: Three times a day (TID) | ORAL | Status: DC | PRN
  Administered 2011-05-16 – 2011-05-20 (×6): 1 mg via ORAL
  Filled 2011-05-16 (×2): qty 1

## 2011-05-16 MED ORDER — ASPIRIN 81 MG PO CHEW
162.0000 mg | CHEWABLE_TABLET | Freq: Every day | ORAL | Status: DC
  Administered 2011-05-16 – 2011-05-21 (×6): 162 mg via ORAL
  Filled 2011-05-16 (×2): qty 2

## 2011-05-16 MED ORDER — VENLAFAXINE HCL ER 75 MG PO CP24
75.0000 mg | ORAL_CAPSULE | Freq: Every day | ORAL | Status: DC
  Administered 2011-05-17 – 2011-05-20 (×4): 75 mg via ORAL
  Filled 2011-05-16 (×4): qty 1

## 2011-05-16 MED ORDER — ONDANSETRON HCL 4 MG PO TABS: 4.0000 mg | ORAL_TABLET | Freq: Three times a day (TID) | ORAL | Status: DC | PRN

## 2011-05-16 MED ORDER — SIMVASTATIN 20 MG PO TABS
20.0000 mg | ORAL_TABLET | Freq: Every day | ORAL | Status: DC
  Administered 2011-05-16 – 2011-05-20 (×5): 20 mg via ORAL
  Filled 2011-05-16 (×7): qty 1

## 2011-05-16 MED ORDER — ALUM & MAG HYDROXIDE-SIMETH 200-200-20 MG/5ML PO SUSP: 30.0000 mL | ORAL | Status: DC | PRN

## 2011-05-16 MED ORDER — ARIPIPRAZOLE 2 MG PO TABS
2.0000 mg | ORAL_TABLET | Freq: Every day | ORAL | Status: DC
  Administered 2011-05-16 – 2011-05-20 (×5): 2 mg via ORAL
  Filled 2011-05-16 (×5): qty 1

## 2011-05-16 MED ORDER — LORAZEPAM 1 MG PO TABS
ORAL_TABLET | ORAL | Status: AC
  Filled 2011-05-16: qty 2

## 2011-05-16 MED ORDER — TRAZODONE HCL 50 MG PO TABS
50.0000 mg | ORAL_TABLET | Freq: Every day | ORAL | Status: DC
  Administered 2011-05-16 – 2011-05-19 (×4): 50 mg via ORAL
  Filled 2011-05-16: qty 1

## 2011-05-16 MED ORDER — INSULIN ASPART 100 UNIT/ML ~~LOC~~ SOLN
0.0000 [IU] | SUBCUTANEOUS | Status: AC
  Administered 2011-05-16: 2 [IU] via SUBCUTANEOUS
  Administered 2011-05-16 (×2): 4 [IU] via SUBCUTANEOUS
  Filled 2011-05-16: qty 3

## 2011-05-16 MED ORDER — TEMAZEPAM 30 MG PO CAPS
30.0000 mg | ORAL_CAPSULE | Freq: Every day | ORAL | Status: DC
  Administered 2011-05-16 – 2011-05-20 (×5): 30 mg via ORAL
  Filled 2011-05-16: qty 1

## 2011-05-16 MED ORDER — ACETAMINOPHEN 325 MG PO TABS: 650.0000 mg | ORAL_TABLET | ORAL | Status: DC | PRN

## 2011-05-16 MED ORDER — HYDROCODONE-ACETAMINOPHEN 10-325 MG PO TABS
1.0000 | ORAL_TABLET | ORAL | Status: DC | PRN
  Administered 2011-05-16: 1 via ORAL
  Administered 2011-05-17: 2 via ORAL
  Administered 2011-05-17 – 2011-05-20 (×6): 1 via ORAL
  Filled 2011-05-16 (×2): qty 1

## 2011-05-16 MED ORDER — LISINOPRIL 2.5 MG PO TABS
2.5000 mg | ORAL_TABLET | Freq: Every day | ORAL | Status: DC
  Administered 2011-05-16 – 2011-05-21 (×6): 2.5 mg via ORAL
  Filled 2011-05-16 (×6): qty 1

## 2011-05-16 MED ORDER — LORAZEPAM 1 MG PO TABS
2.0000 mg | ORAL_TABLET | Freq: Once | ORAL | Status: AC
  Administered 2011-05-16: 2 mg via ORAL

## 2011-05-16 MED ORDER — LOPERAMIDE HCL 2 MG PO CAPS
2.0000 mg | ORAL_CAPSULE | ORAL | Status: DC | PRN
  Filled 2011-05-16: qty 1

## 2011-05-16 MED ORDER — INSULIN ASPART 100 UNIT/ML ~~LOC~~ SOLN
0.0000 [IU] | SUBCUTANEOUS | Status: AC
  Administered 2011-05-16 – 2011-05-17 (×3): 2 [IU] via SUBCUTANEOUS
  Filled 2011-05-16: qty 3

## 2011-05-16 MED ORDER — ASPIRIN 325 MG PO TABS
81.0000 mg | ORAL_TABLET | Freq: Every day | ORAL | Status: DC
  Filled 2011-05-16: qty 0.5

## 2011-05-16 MED ORDER — CARVEDILOL 25 MG PO TABS
25.0000 mg | ORAL_TABLET | Freq: Two times a day (BID) | ORAL | Status: DC
  Administered 2011-05-16 – 2011-05-21 (×11): 25 mg via ORAL
  Filled 2011-05-16 (×13): qty 1

## 2011-05-16 MED ORDER — AMLODIPINE BESYLATE 10 MG PO TABS
10.0000 mg | ORAL_TABLET | Freq: Every day | ORAL | Status: DC
  Administered 2011-05-16 – 2011-05-21 (×6): 10 mg via ORAL
  Filled 2011-05-16 (×6): qty 1

## 2011-05-16 MED ORDER — CITALOPRAM HYDROBROMIDE 40 MG PO TABS
40.0000 mg | ORAL_TABLET | Freq: Every day | ORAL | Status: DC
  Administered 2011-05-16: 40 mg via ORAL
  Filled 2011-05-16: qty 1

## 2011-05-16 MED ORDER — ZOLPIDEM TARTRATE 10 MG PO TABS
10.0000 mg | ORAL_TABLET | Freq: Every day | ORAL | Status: DC
  Administered 2011-05-16 – 2011-05-17 (×2): 10 mg via ORAL
  Filled 2011-05-16: qty 1

## 2011-05-16 NOTE — ED Notes (Signed)
Up to the bathroom 

## 2011-05-16 NOTE — ED Notes (Signed)
Insulin doses verified w/ M. Katrinka Blazing RN

## 2011-05-16 NOTE — BH Assessment (Signed)
Pt assessed by [facility] staff "[name]". She informed this Clinical research associate at the end of her shift that pt was referred to [facility] and information was faxed to the facility...pending review for in-pt hospitalization. On-coming staff will need to follow-up with patients placement at [facility]

## 2011-05-16 NOTE — ED Provider Notes (Signed)
Medical screening examination/treatment/procedure(s) were conducted as a shared visit with non-physician practitioner(s) and myself.  I personally evaluated the patient during the encounter  Pt seen with PA Pt stable, no complaints, not grossly psychotic meds reconciled Will get telepsych  Joya Gaskins, MD 05/16/11 [phone]

## 2011-05-16 NOTE — ED Notes (Addendum)
Received pt to Room 39 from triage. Pt A&Ox3. Cooperative with staff, denies pain, denies further need. Stable condition.

## 2011-05-16 NOTE — BH Assessment (Signed)
Assessment Note   Tyler Le is an 65 y.o. male.   Axis I: Generalized Anxiety Disorder and Major Depression, Recurrent severe Axis II: No diagnosis Axis III:  Past Medical History  Diagnosis Date  . Hypertension   . Chronic kidney disease     nephrolithiasis  . Status post coronary artery stent placement March 2012    BMS to SVG to intermediate and OM  . Dyslipidemia   . Obesities, morbid   . Back pain   . Coronary artery disease 2001    CABG X4  . S/P coronary artery stent placement 08/2010  . Complication of anesthesia     "they have a hard time waking him up"  . Angina   . Dysrhythmia   . Shortness of breath 05/08/11    "@ rest and w/exertion"  . Blood transfusion   . Anemia   . GERD (gastroesophageal reflux disease)   . Depression   . Anxiety   . Diabetes mellitus    Axis IV: other psychosocial or environmental problems Axis V: 31-40 impairment in reality testing  Past Medical History:  Past Medical History  Diagnosis Date  . Hypertension   . Chronic kidney disease     nephrolithiasis  . Status post coronary artery stent placement March 2012    BMS to SVG to intermediate and OM  . Dyslipidemia   . Obesities, morbid   . Back pain   . Coronary artery disease 2001    CABG X4  . S/P coronary artery stent placement 08/2010  . Complication of anesthesia     "they have a hard time waking him up"  . Angina   . Dysrhythmia   . Shortness of breath 05/08/11    "@ rest and w/exertion"  . Blood transfusion   . Anemia   . GERD (gastroesophageal reflux disease)   . Depression   . Anxiety   . Diabetes mellitus     Past Surgical History  Procedure Date  . Coronary stent placement March 2012    BMS to SVG to intermediate/OM  . Coronary artery bypass graft 2001  . Appendectomy ~ 1955  . Lumbar laminectomy 01/2011  . Back surgery   . Cholecystectomy   . Rotator cuff repair     left  . Coronary angioplasty     Family History:  Family History  Problem  Relation Age of Onset  . Heart disease Mother   . Heart disease Father   . Heart attack Father   . Heart disease Brother     Social History:  reports that he has quit smoking. His smoking use included Cigarettes. He has a 100 pack-year smoking history. He has quit using smokeless tobacco. His smokeless tobacco use included Chew. He reports that he does not drink alcohol or use illicit drugs.  Allergies:  Allergies  Allergen Reactions  . Altace Nausea Only  . Metformin And Related Nausea And Vomiting  . Pravachol Nausea And Vomiting  . Wellbutrin (Bupropion Hcl) Nausea And Vomiting    Home Medications:  Medications Prior to Admission  Medication Dose Route Frequency Provider Last Rate Last Dose  . acetaminophen (TYLENOL) tablet 650 mg  650 mg Oral Q4H PRN Otilio Miu, PA      . alum & mag hydroxide-simeth (MAALOX/MYLANTA) 200-200-20 MG/5ML suspension 30 mL  30 mL Oral PRN Otilio Miu, PA      . amLODipine (NORVASC) tablet 10 mg  10 mg Oral Daily Joya Gaskins, MD  10 mg at 05/16/11 1009  . aspirin chewable tablet 162 mg  162 mg Oral Daily Joya Gaskins, MD      . carvedilol (COREG) tablet 25 mg  25 mg Oral BID WC Joya Gaskins, MD   25 mg at 05/16/11 0902  . citalopram (CELEXA) tablet 40 mg  40 mg Oral Daily Joya Gaskins, MD   40 mg at 05/16/11 1009  . HYDROcodone-acetaminophen (NORCO) 10-325 MG per tablet 1 tablet  1 tablet Oral Q4H PRN Joya Gaskins, MD      . insulin aspart (novoLOG) injection 0-24 Units  0-24 Units Subcutaneous Q2H Joya Gaskins, MD   4 Units at 05/16/11 1113  . lisinopril (PRINIVIL,ZESTRIL) tablet 2.5 mg  2.5 mg Oral Daily Joya Gaskins, MD   2.5 mg at 05/16/11 1009  . loperamide (IMODIUM) capsule 2 mg  2 mg Oral PRN Toy Baker, MD      . LORazepam (ATIVAN) 1 MG tablet           . LORazepam (ATIVAN) tablet 1 mg  1 mg Oral Q8H PRN Otilio Miu, PA   1 mg at 05/16/11 0109  . LORazepam (ATIVAN)  tablet 2 mg  2 mg Oral Once Joya Gaskins, MD   2 mg at 05/16/11 0600  . ondansetron (ZOFRAN) tablet 4 mg  4 mg Oral Q8H PRN Otilio Miu, PA      . simvastatin (ZOCOR) tablet 20 mg  20 mg Oral QHS Joya Gaskins, MD      . zolpidem [facility]) tablet 10 mg  10 mg Oral QHS Joya Gaskins, MD      . DISCONTD: aspirin tablet 162.5 mg  162.5 mg Oral Daily Joya Gaskins, MD       Medications Prior to Admission  Medication Sig Dispense Refill  . ALPRAZolam (XANAX) 0.5 MG tablet Take 0.5 mg by mouth 4 (four) times daily as needed. For anxiety       . amLODipine (NORVASC) 10 MG tablet Take 1 tablet (10 mg total) by mouth daily.  30 tablet  3  . aspirin 81 MG tablet Take 1 tablet (81 mg total) by mouth daily.      . carvedilol (COREG) 25 MG tablet Take 25 mg by mouth 2 (two) times daily with a meal.        . Cholecalciferol (VITAMIN D3) 2000 UNITS TABS Take 2 tablets by mouth daily.        . citalopram (CELEXA) 40 MG tablet Take 1 tablet (40 mg total) by mouth daily.      Marland Kitchen HYDROcodone-acetaminophen (NORCO) 10-325 MG per tablet Take 1 tablet by mouth every 4 (four) hours as needed. For pain       . insulin aspart protamine-insulin aspart (NOVOLOG 70/30) (70-30) 100 UNIT/ML injection Inject 40-50 Units into the skin 2 (two) times daily with a meal. Take 50 units in the morning and 40 units in the evening      . insulin glargine (LANTUS) 100 UNIT/ML injection Inject 60 Units into the skin 2 (two) times daily.       . isosorbide mononitrate (IMDUR) 30 MG 24 hr tablet Take 30 mg by mouth at bedtime. Take whole tablet daily at night       . lisinopril (PRINIVIL,ZESTRIL) 5 MG tablet Take 2.5 mg by mouth daily.        . Multiple Vitamin (MULTIVITAMIN) tablet Take 1 tablet by mouth daily.       Marland Kitchen  nitroGLYCERIN (NITROSTAT) 0.4 MG SL tablet Place 1 tablet (0.4 mg total) under the tongue every 5 (five) minutes as needed for chest pain (up to 3 doses).  25 tablet  4  . omeprazole (PRILOSEC) 20  MG capsule Take 20 mg by mouth 2 (two) times daily before a meal.        . Pramlintide Acetate (SYMLINPEN 120) 2700 MCG/2.7ML SOLN Inject 60 Units into the skin 2 (two) times daily before a meal.        . simvastatin (ZOCOR) 40 MG tablet Take 20 mg by mouth at bedtime.        Marland Kitchen zolpidem (AMBIEN) 10 MG tablet Take 10 mg by mouth at bedtime as needed. For insomnia        OB/GYN Status:  No LMP for male patient.  General Assessment Data Assessment Number: 1  Living Arrangements: Spouse/significant other Can pt return to current living arrangement?: Yes Admission Status: Voluntary Is patient capable of signing voluntary admission?: Yes Transfer from: Other (Comment) Referral Source:  ([facility])  Risk to self Suicidal Ideation: Yes-Currently Present Suicidal Intent: No Is patient at risk for suicide?: Yes Suicidal Plan?: Yes-Currently Present Specify Current Suicidal Plan: walk in to trafic, some to graphic to explain per pt Access to Means: Yes Specify Access to Suicidal Means: roads in area, vehicle at home What has been your use of drugs/alcohol within the last 12 months?: none Other Self Harm Risks: none Triggers for Past Attempts: None known Intentional Self Injurious Behavior: None Factors that decrease suicide risk: Positive social support Family Suicide History: Yes (stepson committed suicide at 77, nieghbor hung self 2 weeks ) Recent stressful life event(s): Other (Comment) (2 surgeries since April '12 that has reduce mobility) Persecutory voices/beliefs?: No Depression: Yes Depression Symptoms: Feeling angry/irritable Substance abuse history and/or treatment for substance abuse?: Yes Suicide prevention information given to non-admitted patients: Not applicable  Risk to Others Homicidal Ideation: Yes-Currently Present Thoughts of Harm to Others: Yes-Currently Present Comment - Thoughts of Harm to Others: thoughts of hurting grandson who came in town yesterday (1x incident not  feeling this way currently) Current Homicidal Intent: No Current Homicidal Plan: No Access to Homicidal Means: No Describe Access to Homicidal Means: none Identified Victim: grandson History of harm to others?: No Assessment of Violence: None Noted Violent Behavior Description: none Does patient have access to weapons?: No Criminal Charges Pending?: No Does patient have a court date: No  Mental Status Report Appear/Hygiene:  (appropriate) Eye Contact: Good Motor Activity: Gestures;Restlessness Speech: Slow Level of Consciousness: Alert Mood: Depressed Affect: Anxious Anxiety Level: Moderate Thought Processes: Relevant Judgement: Unimpaired Orientation: Person;Place;Time;Situation Obsessive Compulsive Thoughts/Behaviors: None  Cognitive Functioning Concentration: Normal Memory: Recent Intact;Remote Intact IQ: Average Insight: Fair Impulse Control: Fair Appetite: Poor Weight Loss:  (20 pound weight lose in 2 months) Weight Gain: 0  Sleep: Decreased Total Hours of Sleep: 4  (takes ambien at 10:30 wakes every night at 2:30) Vegetative Symptoms: None  Prior Inpatient/Outpatient Therapy Prior Therapy: [facility]patient [facility] [place]) Prior Therapy Dates:  (2012- prior) Prior [facility] Fac[facility]Provider[facility]): [facility] Reason for Treatment: depression/anxiety            Values / Beliefs Cultural Requests During Hospitalization: None Spiritual Requests During Hospitalization: None        Additional Information 1:1 In Past 12 Months?: No CIRT Risk: No Elopement Risk: No Does patient have medical clearance?: Yes  Child/Adolescent Assessment Running Away Risk: Denies Bed-Wetting: Denies Destruction of Property: Denies Cruelty to  Animals: Denies Stealing: Denies Rebellious/Defies Authority: Denies Satanic Involvement: Denies Archivist: Denies Problems at [employer]: Denies Gang Involvement: Denies  Disposition:   Pt reports that he has been  depressed for 2-3 months.  [facility] with psychiatrist.  He stated that he has had severe mood swings for 2-3 weeks and started having suicidal thoughts.  HE stated that 1 week ago the suicidal thoughts become constant and he began thinking of ways he could harm himself. He stated that he has thought of ways that are to graffic to share.  This scared patient and wife. Pt also reports that sometimes he feels paranoid in crowds and his anxiety kicks in really bad. Pt stated that his grandson came to home yesterday for the holidays and he had homicidal thoughts towards him. Pt stated that his grandson has never harmed or made any statements towards him and he is unsure where these feelings came from. Pt reports that he sleeps about 4hrs a night when he takes his [place]. He denies napping during the day and states that he is constantly awake and wants to go to sleep. Pt has no prior inpatient stays. Pt is being referred to [facility] for inpatient services. Disposition pending  On Site Evaluation by:   Reviewed with Physician:     Dennie Maizes 05/16/2011 11:41 AM

## 2011-05-16 NOTE — ED Notes (Signed)
Note- insulin given lt mid abd not arm

## 2011-05-16 NOTE — ED Provider Notes (Signed)
History     CSN: [id number] Arrival date & time: 05/15/2011  7:29 PM   None     Chief Complaint  Patient presents with  . V70.1    depression    (Consider location/radiation/quality/duration/timing/severity/associated sxs/prior treatment) HPI History provided by pt.   Pt has been feeling depressed for the past 2-3 months.  In the past 2-3 weeks, he has been feeling suicidal.  No past attempts and no plan currently.  Pt was on citalopram for a couple years but [place] physician recently d/c'd d/t worsening depression.  Was started on fluoxetine but primary care d/c'd this drug d/t SE and placed him back on citalopram.  These medications are not helping.  Does not have a psychiatrist.  Has not started any other new medications recently.  Denies drug/alcohol abuse.    Past Medical History  Diagnosis Date  . Hypertension   . Chronic kidney disease     nephrolithiasis  . Status post coronary artery stent placement March 2012    BMS to SVG to intermediate and OM  . Dyslipidemia   . Obesities, morbid   . Back pain   . Coronary artery disease 2001    CABG X4  . S/P coronary artery stent placement 08/2010  . Complication of anesthesia     "they have a hard time waking him up"  . Angina   . Dysrhythmia   . Shortness of breath 05/08/11    "@ rest and w/exertion"  . Blood transfusion   . Anemia   . GERD (gastroesophageal reflux disease)   . Depression   . Anxiety   . Diabetes mellitus     Past Surgical History  Procedure Date  . Coronary stent placement March 2012    BMS to SVG to intermediate/OM  . Coronary artery bypass graft 2001  . Appendectomy ~ 1955  . Lumbar laminectomy 01/2011  . Back surgery   . Cholecystectomy   . Rotator cuff repair     left  . Coronary angioplasty     Family History  Problem Relation Age of Onset  . Heart disease Mother   . Heart disease Father   . Heart attack Father   . Heart disease Brother     History  Substance Use Topics  . Smoking  status: Former Smoker -- 4.0 packs/day for 25 years    Types: Cigarettes  . Smokeless tobacco: Former Neurosurgeon    Types: Chew   Comment: "quit smoking ~ 1985"  . Alcohol Use: No     "might take a drink 3 X/yr"      Review of Systems  All other systems reviewed and are negative.    Allergies  Altace; Metformin and related; Pravachol; and Wellbutrin  Home Medications   Current Outpatient Rx  Name Route Sig Dispense Refill  . ALPRAZOLAM 0.5 MG PO TABS Oral Take 0.5 mg by mouth 4 (four) times daily as needed. For anxiety     . AMLODIPINE BESYLATE 10 MG PO TABS Oral Take 1 tablet (10 mg total) by mouth daily. 30 tablet 3  . ASPIRIN 81 MG PO TABS Oral Take 1 tablet (81 mg total) by mouth daily.    Marland Kitchen CARVEDILOL 25 MG PO TABS Oral Take 25 mg by mouth 2 (two) times daily with a meal.      . VITAMIN D3 2000 UNITS PO TABS Oral Take 2 tablets by mouth daily.      Marland Kitchen CITALOPRAM HYDROBROMIDE 40 MG PO TABS Oral Take  1 tablet (40 mg total) by mouth daily.      The FDA recommends that no more than 20mg  be presc ...  . HYDROCODONE-ACETAMINOPHEN 10-325 MG PO TABS Oral Take 1 tablet by mouth every 4 (four) hours as needed. For pain     . INSULIN ASPART PROT & ASPART (70-30) 100 UNIT/ML [place] SUSP Subcutaneous Inject 40-50 Units into the skin 2 (two) times daily with a meal. Take 50 units in the morning and 40 units in the evening    . INSULIN GLARGINE 100 UNIT/ML [place] SOLN Subcutaneous Inject 60 Units into the skin 2 (two) times daily.     . ISOSORBIDE MONONITRATE ER 30 MG PO TB24 Oral Take 30 mg by mouth at bedtime. Take whole tablet daily at night     . LISINOPRIL 5 MG PO TABS Oral Take 2.5 mg by mouth daily.      Marland Kitchen ONE-DAILY MULTI VITAMINS PO TABS Oral Take 1 tablet by mouth daily.     Marland Kitchen NITROGLYCERIN 0.4 MG SL SUBL Sublingual Place 1 tablet (0.4 mg total) under the tongue every 5 (five) minutes as needed for chest pain (up to 3 doses). 25 tablet 4  . OMEPRAZOLE 20 MG PO CPDR Oral Take 20 mg by mouth 2  (two) times daily before a meal.      . PRAMLINTIDE ACETATE 2700 MCG/2.7ML [place] SOLN Subcutaneous Inject 60 Units into the skin 2 (two) times daily before a meal.      . SIMVASTATIN 40 MG PO TABS Oral Take 20 mg by mouth at bedtime.      Marland Kitchen ZOLPIDEM TARTRATE 10 MG PO TABS Oral Take 10 mg by mouth at bedtime as needed. For insomnia      BP 138/77  Pulse 102  Temp(Src) 98.8 F (37.1 C) (Oral)  Resp 20  SpO2 97%  Physical Exam  Nursing note and vitals reviewed. Constitutional: He is oriented to person, place, and time. He appears well-developed and well-nourished. No distress.  HENT:  Head: Normocephalic and atraumatic.  Eyes:       Normal appearance  Neck: Normal range of motion.  Neurological: He is alert and oriented to person, place, and time.  Psychiatric: His behavior is normal.       depressed    ED Course  Procedures (including critical care time)  Labs Reviewed  URINE RAPID DRUG SCREEN (HOSP PERFORMED) - Abnormal; Notable for the following:    Opiates POSITIVE (*)    Benzodiazepines POSITIVE (*)    All other components within normal limits  CBC  BASIC METABOLIC PANEL  ETHANOL  URINALYSIS, ROUTINE W REFLEX MICROSCOPIC   No results found.   1. Depression   2. Suicidal ideation       MDM  Pt presents w/ c/o depression and SI.  Labs pending.  Holding orders written.  ACT team consulted.         Otilio Miu, [place] 05/16/11 [phone]

## 2011-05-16 NOTE — ED Notes (Signed)
Pt pleasant, pt reports that he has been depressed and having suicidal thoughts w/o a plan-but reports he has thought about going into traffic and getting hit by a truck.   Pt reports that he has not been able to do much due to his medical problems-back surgery 3 months ago; cardiac stent placement last year (had a repeat cath last week that was good per pt).  Pt reports that he has bit been having homicidal thoughts until yesterday.  Pt reports that the thoughts were not directed at anyone in particular and he did not have a plan, but at that point he realized he needed help and had his wife bring him in.  Pt reports that he has been seeing a MD and has been seen at the [place] and that both have changed his anti-depressenet med and that he was increased to 40mg  of celexa recently.   Pt denies AVH at this time but reports that he has heard voices in the past, and that he has been having unusual dreams-last night he dreamed about snakes.  Pt also reports chronic chest that he uses nitro for as needed, and chronic back pain  That he takes vicodin for.  NAD, resting quietly, procedures explained.

## 2011-05-16 NOTE — BH Assessment (Signed)
Pt assessed at today 05/16/2011 by [facility] staff Tiffany. Pt also completed a telepsych consult which indicated that pt needs in-pt hospitalization. Pt referred to [facility] and declined due to excessive medical problems and no available beds. The declining MD recommended a [facility] referral [facility] [place]).

## 2011-05-17 ENCOUNTER — Other Ambulatory Visit: Payer: Self-pay

## 2011-05-17 LAB — GLUCOSE, CAPILLARY
Glucose-Capillary: 187 mg/dL — ABNORMAL HIGH (ref 70–99)
Glucose-Capillary: 181 mg/dL — ABNORMAL HIGH (ref 70–99)

## 2011-05-17 MED ORDER — INSULIN ASPART 100 UNIT/ML ~~LOC~~ SOLN
0.0000 [IU] | SUBCUTANEOUS | Status: AC
  Administered 2011-05-17: 4 [IU] via SUBCUTANEOUS
  Administered 2011-05-17 (×2): 2 [IU] via SUBCUTANEOUS

## 2011-05-17 NOTE — ED Notes (Signed)
[facility] requested an EKG and Chest Xray. Currently awaiting results to fax for disposition.

## 2011-05-17 NOTE — ED Notes (Signed)
TC with Crystal @ [facility]. Discussed pt and faxed info over for IPT review.

## 2011-05-17 NOTE — [facility] Notes (Addendum)
This Clinical research associate faxed patient's information to [employer] and [facility].  Unable to reach pt trf coordinator in [facility] and patient has Medicare which offers him the ability to receive care from other facilities.  This Clinical research associate will follow-up with [facility] and [facility] for disposition.  Ileene Hutchinson , MSW, LCSWA 05/17/2011  10:16 AM  Patient declined by Yvetta Coder due to medical acuity.  Ileene Hutchinson , MSW, LCSWA 05/17/2011 11:11 AM

## 2011-05-17 NOTE — ED Provider Notes (Addendum)
8:07 AM The patient was seen and evaluated by me this morning and is in no apparent distress, is awake and alert, and is aware of the current treatment plan.  The patient makes no requests for any interventions or treatments otherwise at this time.  He states that he slept well last night.  Felisa Bonier, MD 05/17/11 [phone]  8:40 AM Patient resting comfortably this morning.  Is in no distress.  Needed insulin order renewed this morning.  This was done.  No new other complaints or problems.    Geoffery Lyons, MD 05/18/11 [phone]

## 2011-05-17 NOTE — ED Notes (Signed)
Pt. Ate 100% of dinner. 

## 2011-05-17 NOTE — BH Assessment (Signed)
Assessment Note   Tyler Le is an 65 y.o. male. Pt reports that he has been depressed for 2-3 months. [facility] with psychiatrist. He stated that he has had severe mood swings for 2-3 weeks and started having suicidal thoughts. HE stated that 1 week ago the suicidal thoughts become constant and he began thinking of ways he could harm himself. He stated that he has thought of ways that are to graffic to share. This scared patient and wife. Pt also reports that sometimes he feels paranoid in crowds and his anxiety kicks in really bad. Pt stated that his grandson came to home yesterday for the holidays and he had homicidal thoughts towards him. Pt stated that his grandson has never harmed or made any statements towards him and he is unsure where these feelings came from. Pt reports that he sleeps about 4hrs a night when he takes his [place]. He denies napping during the day and states that he is constantly awake and wants to go to sleep. Pt has no prior inpatient stays. Attempting placement for pt. Pt declined by [facility] due to numerous medical issues. Pt also declined by [facility] and [facility]. No beds at [facility], [facility] or [facility]. Info sent to [facility], but informed that no beds are open and nothing projected for the weekend. Info also sent to [facility].   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 Hypertension   . Chronic kidney disease     nephrolithiasis  . Status post coronary artery stent placement March 2012    BMS to SVG to intermediate and OM  . Dyslipidemia   . Obesities, morbid   . Back pain   . Coronary artery disease 2001    CABG X4  . S/P coronary artery stent placement 08/2010  . Complication of anesthesia     "they have a hard time waking him up"  . Angina   . Dysrhythmia   . Shortness of breath 05/08/11      "@ rest and w/exertion"  . Blood transfusion   . Anemia   . GERD (gastroesophageal reflux disease)   . Depression   . Anxiety   . Diabetes mellitus    Axis IV: other psychosocial or environmental problems Axis V: 21-30 behavior considerably influenced by delusions or hallucinations OR serious impairment in judgment, communication OR inability to function in almost all areas  Past Medical History:  Past Medical History  Diagnosis Date  . Hypertension   . Chronic kidney disease     nephrolithiasis  . Status post coronary artery stent placement March 2012    BMS to SVG to intermediate and OM  . Dyslipidemia   . Obesities, morbid   . Back pain   . Coronary artery disease 2001    CABG X4  . S/P coronary artery stent placement 08/2010  . Complication of anesthesia     "they have a hard time waking him up"  . Angina   . Dysrhythmia   . Shortness of breath 05/08/11    "@ rest and w/exertion"  . Blood transfusion   . Anemia   . GERD (gastroesophageal reflux disease)   . Depression   . Anxiety   . Diabetes mellitus     Past Surgical History  Procedure Date  . Coronary stent placement March  2012    BMS to SVG to intermediate/OM  . Coronary artery bypass graft 2001  . Appendectomy ~ 1955  . Lumbar laminectomy 01/2011  . Back surgery   . Cholecystectomy   . Rotator cuff repair     left  . Coronary angioplasty     Family History:  Family History  Problem Relation Age of Onset  . Heart disease Mother   . Heart disease Father   . Heart attack Father   . Heart disease Brother     Social History:  reports that he has quit smoking. His smoking use included Cigarettes. He has a 100 pack-year smoking history. He has quit using smokeless tobacco. His smokeless tobacco use included Chew. He reports that he does not drink alcohol or use illicit drugs.  Allergies:  Allergies  Allergen Reactions  . Altace Nausea Only  . Metformin And Related Nausea And Vomiting  .  Pravachol Nausea And Vomiting  . Wellbutrin (Bupropion Hcl) Nausea And Vomiting    Home Medications:  Medications Prior to Admission  Medication Dose Route Frequency Provider Last Rate Last Dose  . acetaminophen (TYLENOL) tablet 650 mg  650 mg Oral Q4H PRN Otilio Miu, PA      . alum & mag hydroxide-simeth (MAALOX/MYLANTA) 200-200-20 MG/5ML suspension 30 mL  30 mL Oral PRN Otilio Miu, PA      . amLODipine (NORVASC) tablet 10 mg  10 mg Oral Daily Joya Gaskins, MD   10 mg at 05/17/11 0916  . ARIPiprazole (ABILIFY) tablet 2 mg  2 mg Oral Daily Toy Baker, MD   2 mg at 05/17/11 0911  . aspirin chewable tablet 162 mg  162 mg Oral Daily Joya Gaskins, MD   162 mg at 05/17/11 1039  . carvedilol (COREG) tablet 25 mg  25 mg Oral BID WC Joya Gaskins, MD   25 mg at 05/17/11 1609  . HYDROcodone-acetaminophen (NORCO) 10-325 MG per tablet 1 tablet  1 tablet Oral Q4H PRN Joya Gaskins, MD   2 tablet at 05/17/11 1544  . insulin aspart (novoLOG) injection 0-24 Units  0-24 Units Subcutaneous Q2H Joya Gaskins, MD   2 Units at 05/16/11 1345  . insulin aspart (novoLOG) injection 0-24 Units  0-24 Units Subcutaneous Q4H while awake Theda Sers, PHARMD   2 Units at 05/17/11 2130  . insulin aspart (novoLOG) injection 0-24 Units  0-24 Units Subcutaneous Q4H while awake Felisa Bonier, MD   2 Units at 05/17/11 1720  . lisinopril (PRINIVIL,ZESTRIL) tablet 2.5 mg  2.5 mg Oral Daily Joya Gaskins, MD   2.5 mg at 05/17/11 0913  . loperamide (IMODIUM) capsule 2 mg  2 mg Oral PRN Toy Baker, MD      . LORazepam (ATIVAN) 1 MG tablet           . LORazepam (ATIVAN) tablet 1 mg  1 mg Oral Q8H PRN Otilio Miu, PA   1 mg at 05/17/11 1534  . LORazepam (ATIVAN) tablet 2 mg  2 mg Oral Once Joya Gaskins, MD   2 mg at 05/16/11 0600  . ondansetron (ZOFRAN) tablet 4 mg  4 mg Oral Q8H PRN Otilio Miu, PA      . simvastatin (ZOCOR) tablet 20 mg  20  mg Oral QHS Joya Gaskins, MD   20 mg at 05/16/11 2248  . temazepam (RESTORIL) capsule 30 mg  30 mg Oral QHS Toy Baker, MD  30 mg at 05/16/11 2248  . traZODone (DESYREL) tablet 50 mg  50 mg Oral QHS Toy Baker, MD   50 mg at 05/16/11 2248  . venlafaxine (EFFEXOR-XR) 24 hr capsule 75 mg  75 mg Oral QPC breakfast Toy Baker, MD   75 mg at 05/17/11 0910  . zolpidem (AMBIEN) tablet 10 mg  10 mg Oral QHS Joya Gaskins, MD   10 mg at 05/16/11 2349  . DISCONTD: aspirin tablet 162.5 mg  162.5 mg Oral Daily Joya Gaskins, MD      . DISCONTD: citalopram (CELEXA) tablet 40 mg  40 mg Oral Daily Joya Gaskins, MD   40 mg at 05/16/11 1009   Medications Prior to Admission  Medication Sig Dispense Refill  . ALPRAZolam (XANAX) 0.5 MG tablet Take 0.5 mg by mouth 4 (four) times daily as needed. For anxiety       . amLODipine (NORVASC) 10 MG tablet Take 1 tablet (10 mg total) by mouth daily.  30 tablet  3  . aspirin 81 MG tablet Take 1 tablet (81 mg total) by mouth daily.      . carvedilol (COREG) 25 MG tablet Take 25 mg by mouth 2 (two) times daily with a meal.        . Cholecalciferol (VITAMIN D3) 2000 UNITS TABS Take 2 tablets by mouth daily.        . citalopram (CELEXA) 40 MG tablet Take 1 tablet (40 mg total) by mouth daily.      Marland Kitchen HYDROcodone-acetaminophen (NORCO) 10-325 MG per tablet Take 1 tablet by mouth every 4 (four) hours as needed. For pain       . insulin aspart protamine-insulin aspart (NOVOLOG 70/30) (70-30) 100 UNIT/ML injection Inject 40-50 Units into the skin 2 (two) times daily with a meal. Take 50 units in the morning and 40 units in the evening      . insulin glargine (LANTUS) 100 UNIT/ML injection Inject 60 Units into the skin 2 (two) times daily.       . isosorbide mononitrate (IMDUR) 30 MG 24 hr tablet Take 30 mg by mouth at bedtime. Take whole tablet daily at night       . lisinopril (PRINIVIL,ZESTRIL) 5 MG tablet Take 2.5 mg by mouth daily.        .  Multiple Vitamin (MULTIVITAMIN) tablet Take 1 tablet by mouth daily.       . nitroGLYCERIN (NITROSTAT) 0.4 MG SL tablet Place 1 tablet (0.4 mg total) under the tongue every 5 (five) minutes as needed for chest pain (up to 3 doses).  25 tablet  4  . omeprazole (PRILOSEC) 20 MG capsule Take 20 mg by mouth 2 (two) times daily before a meal.        . Pramlintide Acetate (SYMLINPEN 120) 2700 MCG/2.7ML SOLN Inject 60 Units into the skin 2 (two) times daily before a meal.        . simvastatin (ZOCOR) 40 MG tablet Take 20 mg by mouth at bedtime.        Marland Kitchen zolpidem (AMBIEN) 10 MG tablet Take 10 mg by mouth at bedtime as needed. For insomnia        OB/GYN Status:  No LMP for male patient.  General Assessment Data Assessment Number: 2  Living Arrangements: Spouse/significant other Can pt return to current living arrangement?: Yes Admission Status: Voluntary Is patient capable of signing voluntary admission?: Yes Transfer from: Home Referral Source: Self/Family/Friend  Risk to self Suicidal  Ideation: Yes-Currently Present Suicidal Intent: No-Not Currently/Within Last 6 Months Is patient at risk for suicide?: Yes Suicidal Plan?: No-Not Currently/Within Last 6 Months Specify Current Suicidal Plan: NONE Access to Means: No Specify Access to Suicidal Means: n/a What has been your use of drugs/alcohol within the last 12 months?: ETOH on occassion Other Self Harm Risks: n/a Triggers for Past Attempts: None known Intentional Self Injurious Behavior: None Factors that decrease suicide risk: Absense of psychosis Family Suicide History: Yes ([place] - SI at age 80; neighbor hung self few wks ago) Recent stressful life event(s): Other (Comment);Financial Problems (2 surgeries recently to reduce mobility) Persecutory voices/beliefs?: No Depression: Yes Depression Symptoms: Isolating;Fatigue;Guilt;Feeling angry/irritable Substance abuse history and/or treatment for substance abuse?: No Suicide prevention  information given to non-admitted patients: Not applicable  Risk to Others Homicidal Ideation: No-Not Currently/Within Last 6 Months Thoughts of Harm to Others: No-Not Currently Present/Within Last 6 Months Comment - Thoughts of Harm to Others: reported some thoughts earlier of hurting grandson, but not currently Current Homicidal Intent: No Current Homicidal Plan: No Access to Homicidal Means: No Describe Access to Homicidal Means: None Identified Victim: Grandson History of harm to others?: No Assessment of Violence: None Noted Violent Behavior Description: None Does patient have access to weapons?: No Criminal Charges Pending?: No Does patient have a court date: No  Mental Status Report Appear/Hygiene: Disheveled Eye Contact: Fair Motor Activity: Restlessness Speech: Slow Level of Consciousness: Alert Mood: Anxious;Depressed Affect: Anxious Anxiety Level: Minimal Thought Processes: Coherent Judgement: Unimpaired Orientation: Person;Place;Time;Situation Obsessive Compulsive Thoughts/Behaviors: None  Cognitive Functioning Concentration: Normal Memory: Recent Intact;Remote Intact IQ: Average Insight: Fair Impulse Control: Fair Appetite: Fair Weight Loss: 0  Weight Gain: 0  Sleep: No Change Total Hours of Sleep: 4  Vegetative Symptoms: None  Prior Inpatient/Outpatient Therapy Prior Therapy: Inpatient Prior Therapy Dates: 2011 & 2012 [facility] Reason for Treatment: depression/anxiety            Values / Beliefs Cultural Requests During Hospitalization: None Spiritual Requests During Hospitalization: None        Additional Information 1:1 In Past 12 Months?: No CIRT Risk: No Elopement Risk: No Does patient have medical clearance?: Yes  Child/Adolescent Assessment Running Away Risk: Denies Bed-Wetting: Denies Destruction of Property: Denies Cruelty to Animals: Denies Stealing:  Denies Rebellious/Defies Authority: Denies Satanic Involvement: Denies Archivist: Denies Problems at [employer]: Denies Gang Involvement: Denies  Disposition:  Disposition Disposition of Patient: [facility])  On Site Evaluation by:   Reviewed with Physician:     Romeo Apple 05/17/2011 6:14 PM

## 2011-05-17 NOTE — ED Notes (Signed)
Pt. Ate 100% of lunch.  

## 2011-05-17 NOTE — ED Notes (Signed)
Family at bedside. 

## 2011-05-17 NOTE — ED Notes (Signed)
Pt has been declined at [facility] due to medical acuity with the MD stating that the pt has had a cardiac cath too recently for admission at their facility.

## 2011-05-18 LAB — GLUCOSE, CAPILLARY
Glucose-Capillary: 195 mg/dL — ABNORMAL HIGH (ref 70–99)
Glucose-Capillary: 146 mg/dL — ABNORMAL HIGH (ref 70–99)

## 2011-05-18 MED ORDER — INSULIN ASPART 100 UNIT/ML ~~LOC~~ SOLN
0.0000 [IU] | SUBCUTANEOUS | Status: AC
  Administered 2011-05-18: 8 [IU] via SUBCUTANEOUS
  Administered 2011-05-18: 2 [IU] via SUBCUTANEOUS
  Filled 2011-05-18: qty 3

## 2011-05-18 NOTE — ED Notes (Signed)
Requesting something for anxiety, medicated, resting quietly, watching  tv

## 2011-05-18 NOTE — ED Notes (Signed)
In the shower 

## 2011-05-18 NOTE — ED Notes (Signed)
Pt sitting on the side of the bed, reports he is sleeping much better,  Pt denies SI/HI/AVH?pain at this time, nad, procedures  Explained.

## 2011-05-18 NOTE — BH Assessment (Signed)
Assessment Note   Tyler Le is an 65 y.o. male.  This clinician did a re-assessment of patient.  Patient reports he feels better after having gotten more sleep.  We did talk about suicidality and he reports that he does not currently wish to harm self but acknowledges that the thoughts had been present.  Patient also knows that he has been referred to [facility] in [place].  Wishes that it could be a closer hospital.  Clinician did talk to Debbie at [facility] about denial and she said it had been because of medical concerns.  Other hospitals will be called but patient will continue on [facility] wait list. Axis I: Generalized Anxiety Disorder and Major Depression, Recurrent severe Axis II: Deferred Axis III:  Past Medical History  Diagnosis Date  . Hypertension   . Chronic kidney disease     nephrolithiasis  . Status post coronary artery stent placement March 2012    BMS to SVG to intermediate and OM  . Dyslipidemia   . Obesities, morbid   . Back pain   . Coronary artery disease 2001    CABG X4  . S/P coronary artery stent placement 08/2010  . Complication of anesthesia     "they have a hard time waking him up"  . Angina   . Dysrhythmia   . Shortness of breath 05/08/11    "@ rest and w/exertion"  . Blood transfusion   . Anemia   . GERD (gastroesophageal reflux disease)   . Depression   . Anxiety   . Diabetes mellitus    Axis IV: other psychosocial or environmental problems and problems with primary support group Axis V: 31-40 impairment in reality testing  Past Medical History:  Past Medical History  Diagnosis Date  . Hypertension   . Chronic kidney disease     nephrolithiasis  . Status post coronary artery stent placement March 2012    BMS to SVG to intermediate and OM  . Dyslipidemia   . Obesities, morbid   . Back pain   . Coronary artery disease 2001    CABG X4  . S/P coronary artery stent placement 08/2010  . Complication of anesthesia     "they have a hard time waking  him up"  . Angina   . Dysrhythmia   . Shortness of breath 05/08/11    "@ rest and w/exertion"  . Blood transfusion   . Anemia   . GERD (gastroesophageal reflux disease)   . Depression   . Anxiety   . Diabetes mellitus     Past Surgical History  Procedure Date  . Coronary stent placement March 2012    BMS to SVG to intermediate/OM  . Coronary artery bypass graft 2001  . Appendectomy ~ 1955  . Lumbar laminectomy 01/2011  . Back surgery   . Cholecystectomy   . Rotator cuff repair     left  . Coronary angioplasty     Family History:  Family History  Problem Relation Age of Onset  . Heart disease Mother   . Heart disease Father   . Heart attack Father   . Heart disease Brother     Social History:  reports that he has quit smoking. His smoking use included Cigarettes. He has a 100 pack-year smoking history. He has quit using smokeless tobacco. His smokeless tobacco use included Chew. He reports that he does not drink alcohol or use illicit drugs.  Allergies:  Allergies  Allergen Reactions  . Altace Nausea Only  .  Metformin And Related Nausea And Vomiting  . Pravachol Nausea And Vomiting  . Wellbutrin (Bupropion Hcl) Nausea And Vomiting    Home Medications:  Medications Prior to Admission  Medication Dose Route Frequency Provider Last Rate Last Dose  . acetaminophen (TYLENOL) tablet 650 mg  650 mg Oral Q4H PRN Otilio Miu, PA      . alum & mag hydroxide-simeth (MAALOX/MYLANTA) 200-200-20 MG/5ML suspension 30 mL  30 mL Oral PRN Otilio Miu, PA      . amLODipine (NORVASC) tablet 10 mg  10 mg Oral Daily Joya Gaskins, MD   10 mg at 05/18/11 0959  . ARIPiprazole (ABILIFY) tablet 2 mg  2 mg Oral Daily Toy Baker, MD   2 mg at 05/18/11 0959  . aspirin chewable tablet 162 mg  162 mg Oral Daily Joya Gaskins, MD   162 mg at 05/18/11 1000  . carvedilol (COREG) tablet 25 mg  25 mg Oral BID WC Joya Gaskins, MD   25 mg at 05/18/11 1730  .  HYDROcodone-acetaminophen (NORCO) 10-325 MG per tablet 1 tablet  1 tablet Oral Q4H PRN Joya Gaskins, MD   1 tablet at 05/18/11 2104  . insulin aspart (novoLOG) injection 0-24 Units  0-24 Units Subcutaneous Q2H Joya Gaskins, MD   2 Units at 05/16/11 1345  . insulin aspart (novoLOG) injection 0-24 Units  0-24 Units Subcutaneous Q4H while awake Theda Sers, PHARMD   2 Units at 05/17/11 7829  . insulin aspart (novoLOG) injection 0-24 Units  0-24 Units Subcutaneous Q4H while awake Attending Physician Emergency, MD   2 Units at 05/17/11 2244  . insulin aspart (novoLOG) injection 0-24 Units  0-24 Units Subcutaneous Q4H while awake Geoffery Lyons, MD   8 Units at 05/18/11 1216  . lisinopril (PRINIVIL,ZESTRIL) tablet 2.5 mg  2.5 mg Oral Daily Joya Gaskins, MD   2.5 mg at 05/18/11 0959  . loperamide (IMODIUM) capsule 2 mg  2 mg Oral PRN Toy Baker, MD      . LORazepam (ATIVAN) 1 MG tablet           . LORazepam (ATIVAN) tablet 1 mg  1 mg Oral Q8H PRN Otilio Miu, PA   1 mg at 05/18/11 1429  . LORazepam (ATIVAN) tablet 2 mg  2 mg Oral Once Joya Gaskins, MD   2 mg at 05/16/11 0600  . ondansetron (ZOFRAN) tablet 4 mg  4 mg Oral Q8H PRN Otilio Miu, PA      . simvastatin (ZOCOR) tablet 20 mg  20 mg Oral QHS Joya Gaskins, MD   20 mg at 05/18/11 2143  . temazepam (RESTORIL) capsule 30 mg  30 mg Oral QHS Toy Baker, MD   30 mg at 05/18/11 2143  . traZODone (DESYREL) tablet 50 mg  50 mg Oral QHS Toy Baker, MD   50 mg at 05/18/11 2149  . venlafaxine (EFFEXOR-XR) 24 hr capsule 75 mg  75 mg Oral QPC breakfast Toy Baker, MD   75 mg at 05/18/11 0856  . DISCONTD: aspirin tablet 162.5 mg  162.5 mg Oral Daily Joya Gaskins, MD      . DISCONTD: citalopram (CELEXA) tablet 40 mg  40 mg Oral Daily Joya Gaskins, MD   40 mg at 05/16/11 1009  . DISCONTD: zolpidem (AMBIEN) tablet 10 mg  10 mg Oral QHS Joya Gaskins, MD   10 mg at 05/17/11 2248  Medications Prior to Admission  Medication Sig Dispense Refill  . ALPRAZolam (XANAX) 0.5 MG tablet Take 0.5 mg by mouth 4 (four) times daily as needed. For anxiety       . amLODipine (NORVASC) 10 MG tablet Take 1 tablet (10 mg total) by mouth daily.  30 tablet  3  . aspirin 81 MG tablet Take 1 tablet (81 mg total) by mouth daily.      . carvedilol (COREG) 25 MG tablet Take 25 mg by mouth 2 (two) times daily with a meal.        . Cholecalciferol (VITAMIN D3) 2000 UNITS TABS Take 2 tablets by mouth daily.        . citalopram (CELEXA) 40 MG tablet Take 1 tablet (40 mg total) by mouth daily.      Marland Kitchen HYDROcodone-acetaminophen (NORCO) 10-325 MG per tablet Take 1 tablet by mouth every 4 (four) hours as needed. For pain       . insulin aspart protamine-insulin aspart (NOVOLOG 70/30) (70-30) 100 UNIT/ML injection Inject 40-50 Units into the skin 2 (two) times daily with a meal. Take 50 units in the morning and 40 units in the evening      . insulin glargine (LANTUS) 100 UNIT/ML injection Inject 60 Units into the skin 2 (two) times daily.       . isosorbide mononitrate (IMDUR) 30 MG 24 hr tablet Take 30 mg by mouth at bedtime. Take whole tablet daily at night       . lisinopril (PRINIVIL,ZESTRIL) 5 MG tablet Take 2.5 mg by mouth daily.        . Multiple Vitamin (MULTIVITAMIN) tablet Take 1 tablet by mouth daily.       . nitroGLYCERIN (NITROSTAT) 0.4 MG SL tablet Place 1 tablet (0.4 mg total) under the tongue every 5 (five) minutes as needed for chest pain (up to 3 doses).  25 tablet  4  . omeprazole (PRILOSEC) 20 MG capsule Take 20 mg by mouth 2 (two) times daily before a meal.        . Pramlintide Acetate (SYMLINPEN 120) 2700 MCG/2.7ML SOLN Inject 60 Units into the skin 2 (two) times daily before a meal.        . simvastatin (ZOCOR) 40 MG tablet Take 20 mg by mouth at bedtime.        Marland Kitchen zolpidem (AMBIEN) 10 MG tablet Take 10 mg by mouth at bedtime as needed. For insomnia        OB/GYN Status:  No LMP  for male patient.  General Assessment Data Assessment Number: 3  Living Arrangements: Spouse/significant other Can pt return to current living arrangement?: Yes Admission Status: Voluntary Is patient capable of signing voluntary admission?: Yes Transfer from: Acute Hospital Referral Source: Self/Family/Friend  Risk to self Suicidal Ideation: Yes-Currently Present Suicidal Intent: No Is patient at risk for suicide?: Yes Suicidal Plan?: No-Not Currently/Within Last 6 Months Specify Current Suicidal Plan:  (None reported) Access to Means:  (N/A) Specify Access to Suicidal Means: N/A What has been your use of drugs/alcohol within the last 12 months?:  (None) Other Self Harm Risks: N/A Triggers for Past Attempts: None known Intentional Self Injurious Behavior: None Factors that decrease suicide risk: Absense of psychosis Family Suicide History: Yes Recent stressful life event(s): Financial Problems Persecutory voices/beliefs?: No Depression: Yes Depression Symptoms: Despondent;Fatigue;Feeling worthless/self pity Substance abuse history and/or treatment for substance abuse?: No Suicide prevention information given to non-admitted patients: Not applicable  Risk to Others Homicidal Ideation: No Thoughts  of Harm to Others: No Comment - Thoughts of Harm to Others:  (Some past thoughts of harming grandson but not currently) Current Homicidal Intent: No Current Homicidal Plan: No Access to Homicidal Means: No Describe Access to Homicidal Means:  (None) Identified Victim:  (Grandson?) History of harm to others?: No Assessment of Violence: None Noted Violent Behavior Description: None Does patient have access to weapons?: No Criminal Charges Pending?: No Does patient have a court date: No  Mental Status Report Appear/Hygiene: Improved Eye Contact: Good Motor Activity: Unremarkable Speech: Logical/coherent Level of Consciousness: Alert Mood: Depressed;Sad Affect:  Depressed Anxiety Level: None Thought Processes: Coherent;Relevant Judgement: Unimpaired Orientation: Situation;Time;Place;Person Obsessive Compulsive Thoughts/Behaviors: None  Cognitive Functioning Concentration: Normal Memory: Recent Intact;Remote Intact IQ: Average Insight: Fair Impulse Control: Fair Appetite: Fair Weight Loss:  (Unknown) Weight Gain:  (Unknown) Sleep: No Change Total Hours of Sleep:  (Usually only about 4 hours) Vegetative Symptoms: None  Prior Inpatient/Outpatient Therapy Prior Therapy: Inpatient Prior Therapy Dates: 2011 & 2012 [facility]Provider[facility] Reason for Treatment: depression/anxiety            Values / Beliefs Cultural Requests During Hospitalization: None Spiritual Requests During Hospitalization: None        Additional Information 1:1 In Past 12 Months?: No CIRT Risk: No Elopement Risk: No Does patient have medical clearance?: Yes  Child/Adolescent Assessment Running Away Risk: Denies Bed-Wetting: Denies Destruction of Property: Denies Cruelty to Animals: Denies Stealing: Denies Rebellious/Defies Authority: Denies Satanic Involvement: Denies Archivist: Denies Problems at [employer]: Denies Gang Involvement: Denies  Disposition:  Disposition Disposition of Patient: Inpatient treatment program Type of inpatient treatment program: Adult Patient referred to:  (Referred to [place] in [place])  On Site Evaluation by:   Reviewed with Physician:     Beatriz Stallion Ray 05/18/2011 9:56 PM

## 2011-05-19 LAB — GLUCOSE, CAPILLARY
Glucose-Capillary: 243 mg/dL — ABNORMAL HIGH (ref 70–99)
Glucose-Capillary: 232 mg/dL — ABNORMAL HIGH (ref 70–99)

## 2011-05-19 MED ORDER — INSULIN ASPART 100 UNIT/ML ~~LOC~~ SOLN
0.0000 [IU] | SUBCUTANEOUS | Status: DC
  Administered 2011-05-19: 5 [IU] via SUBCUTANEOUS
  Administered 2011-05-19 (×2): 3 [IU] via SUBCUTANEOUS
  Administered 2011-05-19 – 2011-05-20 (×2): 5 [IU] via SUBCUTANEOUS
  Administered 2011-05-20 (×2): 3 [IU] via SUBCUTANEOUS
  Administered 2011-05-20: 5 [IU] via SUBCUTANEOUS
  Administered 2011-05-21: 3 [IU] via SUBCUTANEOUS

## 2011-05-19 NOTE — ED Notes (Signed)
Up to the desk on the phone 

## 2011-05-19 NOTE — ED Notes (Signed)
verifiied w/ w. stringfellow RN

## 2011-05-19 NOTE — ED Notes (Signed)
Verified w/ T. Rogelia Boga RN

## 2011-05-19 NOTE — Progress Notes (Signed)
ACT followed up with [facility] and pt cannot be admitted today due to no male beds.  ACT is to call them back after 10am on Monday to see if they have discharges and they will keep information already faxed to them.  Lupe Carney was the contact and she was spoken to at [phone].  Call back number is [phone]

## 2011-05-19 NOTE — ED Provider Notes (Signed)
Pt was seen and evaluated by me this am.  Resting comfortably, no complaints other than saying that he is hungry.  Awaiting placement.  Has been accepted by [facility], but no beds currently.  Rolan Bucco, MD 05/19/11 [phone]

## 2011-05-20 DIAGNOSIS — F329 Major depressive disorder, single episode, unspecified: Secondary | ICD-10-CM

## 2011-05-20 LAB — GLUCOSE, CAPILLARY

## 2011-05-20 MED ORDER — QUETIAPINE FUMARATE 100 MG PO TABS
100.0000 mg | ORAL_TABLET | Freq: Once | ORAL | Status: AC
  Administered 2011-05-20: 100 mg via ORAL

## 2011-05-20 MED ORDER — BUPROPION HCL 75 MG PO TABS
75.0000 mg | ORAL_TABLET | Freq: Every day | ORAL | Status: DC
  Administered 2011-05-20 – 2011-05-21 (×2): 75 mg via ORAL
  Filled 2011-05-20 (×2): qty 1

## 2011-05-20 MED ORDER — QUETIAPINE FUMARATE ER 50 MG PO TB24
100.0000 mg | ORAL_TABLET | Freq: Every day | ORAL | Status: DC
  Administered 2011-05-20: 100 mg via ORAL
  Filled 2011-05-20 (×2): qty 2

## 2011-05-20 NOTE — Progress Notes (Signed)
This Clinical research associate spoke with April, Patient Tourist information centre manager at [facility] and there are still no beds available and they do not expect any today.  Ileene Hutchinson , MSW, LCSWA 05/20/2011 1:17 PM

## 2011-05-20 NOTE — Consult Note (Signed)
Patient Identification:  Tyler Le Date of Evaluation:  05/20/2011   History of Present Illness: Psych assessment reviewed. 65 year old male who verbalized depressed mood and having suicidal thoughts on and off without any specific plan. He is unable to sleep properly at night and he relates to his depression tobacco. I also spoke with the wife. Patient is logical and goal-directed during interview not having any psychotic or manic symptoms. I reviewed his medications and explained to him that Effexor is not a good medication for him because of his heart problem and also in combination with the Celexa it can cause a serotonin syndrome. So I will start the patient on Wellbutrin 75 mg in the morning along with Seroquel 100 mg at bedtime for insomnia and to take care of the depressive symptoms. Patient agrees with this treatment plan I discussed with him about options in following the outpatient patient wants to stay in the ER for one more night to see how he sleeps and then want to make a decision on that.     Past Medical History:     Past Medical History  Diagnosis Date  . Hypertension   . Chronic kidney disease     nephrolithiasis  . Status post coronary artery stent placement March 2012    BMS to SVG to intermediate and OM  . Dyslipidemia   . Obesities, morbid   . Back pain   . Coronary artery disease 2001    CABG X4  . S/P coronary artery stent placement 08/2010  . Complication of anesthesia     "they have a hard time waking him up"  . Angina   . Dysrhythmia   . Shortness of breath 05/08/11    "@ rest and w/exertion"  . Blood transfusion   . Anemia   . GERD (gastroesophageal reflux disease)   . Depression   . Anxiety   . Diabetes mellitus        Past Surgical History  Procedure Date  . Coronary stent placement March 2012    BMS to SVG to intermediate/OM  . Coronary artery bypass graft 2001  . Appendectomy ~ 1955  . Lumbar laminectomy 01/2011  . Back surgery   .  Cholecystectomy   . Rotator cuff repair     left  . Coronary angioplasty     Allergies:  Allergies  Allergen Reactions  . Altace Nausea Only  . Metformin And Related Nausea And Vomiting  . Pravachol Nausea And Vomiting  . Wellbutrin (Bupropion Hcl) Nausea And Vomiting    Current Medications:  Prior to Admission medications   Medication Sig Start Date End Date Taking? Authorizing Provider  ALPRAZolam Prudy Feeler) 0.5 MG tablet Take 0.5 mg by mouth 4 (four) times daily as needed. For anxiety  05/01/11  Yes Cassell Clement, MD  amLODipine (NORVASC) 10 MG tablet Take 1 tablet (10 mg total) by mouth daily. 05/10/11  Yes Dayna N Dunn, PA  aspirin 81 MG tablet Take 1 tablet (81 mg total) by mouth daily. 05/10/11  Yes Dayna N Dunn, PA  carvedilol (COREG) 25 MG tablet Take 25 mg by mouth 2 (two) times daily with a meal.     Yes Historical Provider, MD  Cholecalciferol (VITAMIN D3) 2000 UNITS TABS Take 2 tablets by mouth daily.     Yes Historical Provider, MD  citalopram (CELEXA) 40 MG tablet Take 1 tablet (40 mg total) by mouth daily. 05/10/11  Yes Dayna N Dunn, PA  HYDROcodone-acetaminophen (NORCO) 10-325 MG per tablet  Take 1 tablet by mouth every 4 (four) hours as needed. For pain    Yes Historical Provider, MD  insulin aspart protamine-insulin aspart (NOVOLOG 70/30) (70-30) 100 UNIT/ML injection Inject 40-50 Units into the skin 2 (two) times daily with a meal. Take 50 units in the morning and 40 units in the evening   Yes Historical Provider, MD  insulin glargine (LANTUS) 100 UNIT/ML injection Inject 60 Units into the skin 2 (two) times daily.    Yes Historical Provider, MD  isosorbide mononitrate (IMDUR) 30 MG 24 hr tablet Take 30 mg by mouth at bedtime. Take whole tablet daily at night  01/15/11  Yes [name], NP  lisinopril (PRINIVIL,ZESTRIL) 5 MG tablet Take 2.5 mg by mouth daily.     Yes Historical Provider, MD  Multiple Vitamin (MULTIVITAMIN) tablet Take 1 tablet by mouth daily.    Yes  Historical Provider, MD  nitroGLYCERIN (NITROSTAT) 0.4 MG SL tablet Place 1 tablet (0.4 mg total) under the tongue every 5 (five) minutes as needed for chest pain (up to 3 doses). 05/10/11 05/09/12 Yes Dayna N Dunn, PA  omeprazole (PRILOSEC) 20 MG capsule Take 20 mg by mouth 2 (two) times daily before a meal.     Yes Historical Provider, MD  Pramlintide Acetate (SYMLINPEN 120) 2700 MCG/2.7ML SOLN Inject 60 Units into the skin 2 (two) times daily before a meal.     Yes Historical Provider, MD  simvastatin (ZOCOR) 40 MG tablet Take 20 mg by mouth at bedtime.     Yes Historical Provider, MD  zolpidem (AMBIEN) 10 MG tablet Take 10 mg by mouth at bedtime as needed. For insomnia   Yes Historical Provider, MD    Social History:    reports that he has quit smoking. His smoking use included Cigarettes. He has a 100 pack-year smoking history. He has quit using smokeless tobacco. His smokeless tobacco use included Chew. He reports that he does not drink alcohol or use illicit drugs.   Family History:    Family History  Problem Relation Age of Onset  . Heart disease Mother   . Heart disease Father   . Heart attack Father   . Heart disease Brother     Diagnoses Maj. depressive disorder recurrent type.    Recommendations: I will keep the patient in the ER for observation today and reassess him in the morning and will change the medication as I explained in the history of present illness.    Eulogio Ditch, MD

## 2011-05-20 NOTE — ED Provider Notes (Signed)
Psychiatric evaluation is ongoing. Disposition is currently pending. Patient has no current complaints and remains stable at this time.  Pending bed at [facility]. Patrica Duel, MD 05/20/11 [phone]

## 2011-05-21 LAB — GLUCOSE, CAPILLARY: Glucose-Capillary: 231 mg/dL — ABNORMAL HIGH (ref 70–99)

## 2011-05-21 MED ORDER — QUETIAPINE FUMARATE 100 MG PO TABS: 100.0000 mg | ORAL_TABLET | Freq: Every day | ORAL | Status: DC

## 2011-05-21 MED ORDER — BUPROPION HCL 75 MG PO TABS: 75.0000 mg | ORAL_TABLET | Freq: Every day | ORAL | Status: DC

## 2011-05-21 MED ORDER — BUPROPION HCL 75 MG PO TABS: 75.0000 mg | ORAL_TABLET | Freq: Two times a day (BID) | ORAL | Status: DC

## 2011-05-21 NOTE — Progress Notes (Signed)
  Tyler Le is a 65 y.o. male [id number] 1945-08-11  Subjective/Objective: Patient is doing much better today. He had a good night sleep. Patient is logical and goal-directed. He is not having suicidal or homicidal ideations. He is not having any psychotic or manic symptoms. Patient wants to be discharged to followup in the outpatient setting at this time as patient mental status has improved patient can be discharged. Patient wife was involved in the treatment plan.   Filed Vitals:   05/21/11 0601  BP: 148/65  Pulse: 87  Temp: 98.5 F (36.9 C)  Resp: 18    Lab Results:   BMET    Component Value Date/Time   NA 137 05/16/2011 0040   K 4.2 05/16/2011 0040   CL 99 05/16/2011 0040   CO2 28 05/16/2011 0040   GLUCOSE 174* 05/16/2011 0040   BUN 16 05/16/2011 0040   CREATININE 1.07 05/16/2011 0040   CALCIUM 10.4 05/16/2011 0040   GFRNONAA 70* 05/16/2011 0040   GFRAA 81* 05/16/2011 0040    Medications:  Scheduled:     . amLODipine  10 mg Oral Daily  . aspirin  162 mg Oral Daily  . buPROPion  75 mg Oral Daily  . carvedilol  25 mg Oral BID WC  . insulin aspart  0-15 Units Subcutaneous Q4H  . lisinopril  2.5 mg Oral Daily  . QUEtiapine  100 mg Oral QHS  . QUEtiapine  100 mg Oral Once  . simvastatin  20 mg Oral QHS  . temazepam  30 mg Oral QHS  . DISCONTD: ARIPiprazole  2 mg Oral Daily  . DISCONTD: traZODone  50 mg Oral QHS  . DISCONTD: venlafaxine  75 mg Oral QPC breakfast     PRN Meds acetaminophen, alum & mag hydroxide-simeth, HYDROcodone-acetaminophen, loperamide, LORazepam, ondansetron   AHLUWALIA,SHAMSHER S MD 05/21/2011

## 2011-05-21 NOTE — BH Assessment (Signed)
Assessment Note   Tyler Le is an 65 y.o. male.  Patient was reassessed by psychiatrist who has recommended discharge. Patient agreeable with plan. Patient to sign no harm contract and will followup with psychiatrist 05/22/11.  Axis I: Major Depression, Recurrent severe Axis II: Deferred Axis III:  Past Medical History  Diagnosis Date  . Hypertension   . Chronic kidney disease     nephrolithiasis  . Status post coronary artery stent placement March 2012    BMS to SVG to intermediate and OM  . Dyslipidemia   . Obesities, morbid   . Back pain   . Coronary artery disease 2001    CABG X4  . S/P coronary artery stent placement 08/2010  . Complication of anesthesia     "they have a hard time waking him up"  . Angina   . Dysrhythmia   . Shortness of breath 05/08/11    "@ rest and w/exertion"  . Blood transfusion   . Anemia   . GERD (gastroesophageal reflux disease)   . Depression   . Anxiety   . Diabetes mellitus    Axis IV: problems with access to health care services Axis V: 51-60 moderate symptoms  Past Medical History:  Past Medical History  Diagnosis Date  . Hypertension   . Chronic kidney disease     nephrolithiasis  . Status post coronary artery stent placement March 2012    BMS to SVG to intermediate and OM  . Dyslipidemia   . Obesities, morbid   . Back pain   . Coronary artery disease 2001    CABG X4  . S/P coronary artery stent placement 08/2010  . Complication of anesthesia     "they have a hard time waking him up"  . Angina   . Dysrhythmia   . Shortness of breath 05/08/11    "@ rest and w/exertion"  . Blood transfusion   . Anemia   . GERD (gastroesophageal reflux disease)   . Depression   . Anxiety   . Diabetes mellitus     Past Surgical History  Procedure Date  . Coronary stent placement March 2012    BMS to SVG to intermediate/OM  . Coronary artery bypass graft 2001  . Appendectomy ~ 1955  . Lumbar laminectomy 01/2011  . Back surgery     . Cholecystectomy   . Rotator cuff repair     left  . Coronary angioplasty     Family History:  Family History  Problem Relation Age of Onset  . Heart disease Mother   . Heart disease Father   . Heart attack Father   . Heart disease Brother     Social History:  reports that he has quit smoking. His smoking use included Cigarettes. He has a 100 pack-year smoking history. He has quit using smokeless tobacco. His smokeless tobacco use included Chew. He reports that he does not drink alcohol or use illicit drugs.  Allergies:  Allergies  Allergen Reactions  . Altace Nausea Only  . Metformin And Related Nausea And Vomiting  . Pravachol Nausea And Vomiting  . Wellbutrin (Bupropion Hcl) Nausea And Vomiting    Home Medications:  Medications Prior to Admission  Medication Dose Route Frequency Provider Last Rate Last Dose  . acetaminophen (TYLENOL) tablet 650 mg  650 mg Oral Q4H PRN Otilio Miu, PA      . alum & mag hydroxide-simeth (MAALOX/MYLANTA) 200-200-20 MG/5ML suspension 30 mL  30 mL Oral PRN Otilio Miu,  PA      . amLODipine (NORVASC) tablet 10 mg  10 mg Oral Daily Joya Gaskins, MD   10 mg at 05/21/11 0952  . aspirin chewable tablet 162 mg  162 mg Oral Daily Joya Gaskins, MD   162 mg at 05/21/11 0947  . buPROPion [facility]) tablet 75 mg  75 mg Oral Daily Katheren Puller, MD   75 mg at 05/21/11 0947  . carvedilol (COREG) tablet 25 mg  25 mg Oral BID WC Joya Gaskins, MD   25 mg at 05/21/11 0754  . HYDROcodone-acetaminophen (NORCO) 10-325 MG per tablet 1 tablet  1 tablet Oral Q4H PRN Joya Gaskins, MD   1 tablet at 05/20/11 1025  . insulin aspart (novoLOG) injection 0-15 Units  0-15 Units Subcutaneous Q4H Rolan Bucco, MD   3 Units at 05/21/11 0645  . insulin aspart (novoLOG) injection 0-24 Units  0-24 Units Subcutaneous Q2H Joya Gaskins, MD   2 Units at 05/16/11 1345  . insulin aspart (novoLOG) injection 0-24 Units  0-24 Units  Subcutaneous Q4H while awake Theda Sers, PHARMD   2 Units at 05/17/11 2130  . insulin aspart (novoLOG) injection 0-24 Units  0-24 Units Subcutaneous Q4H while awake Attending Physician Emergency, MD   2 Units at 05/17/11 2244  . insulin aspart (novoLOG) injection 0-24 Units  0-24 Units Subcutaneous Q4H while awake Geoffery Lyons, MD   8 Units at 05/18/11 1216  . lisinopril (PRINIVIL,ZESTRIL) tablet 2.5 mg  2.5 mg Oral Daily Joya Gaskins, MD   2.5 mg at 05/21/11 0947  . loperamide (IMODIUM) capsule 2 mg  2 mg Oral PRN Toy Baker, MD      . LORazepam (ATIVAN) 1 MG tablet           . LORazepam (ATIVAN) tablet 1 mg  1 mg Oral Q8H PRN Otilio Miu, PA   1 mg at 05/20/11 1024  . LORazepam (ATIVAN) tablet 2 mg  2 mg Oral Once Joya Gaskins, MD   2 mg at 05/16/11 0600  . ondansetron (ZOFRAN) tablet 4 mg  4 mg Oral Q8H PRN Arie Sabina Schinlever, PA      . QUEtiapine (SEROQUEL XR) 24 hr tablet 100 mg  100 mg Oral QHS Katheren Puller, MD   100 mg at 05/20/11 2323  . QUEtiapine (SEROQUEL) tablet 100 mg  100 mg Oral Once Katheren Puller, MD   100 mg at 05/20/11 1237  . simvastatin (ZOCOR) tablet 20 mg  20 mg Oral QHS Joya Gaskins, MD   20 mg at 05/20/11 0756  . temazepam (RESTORIL) capsule 30 mg  30 mg Oral QHS Toy Baker, MD   30 mg at 05/20/11 2322  . DISCONTD: ARIPiprazole (ABILIFY) tablet 2 mg  2 mg Oral Daily Toy Baker, MD   2 mg at 05/20/11 1017  . DISCONTD: aspirin tablet 162.5 mg  162.5 mg Oral Daily Joya Gaskins, MD      . DISCONTD: citalopram (CELEXA) tablet 40 mg  40 mg Oral Daily Joya Gaskins, MD   40 mg at 05/16/11 1009  . DISCONTD: traZODone (DESYREL) tablet 50 mg  50 mg Oral QHS Toy Baker, MD   50 mg at 05/19/11 2207  . DISCONTD: venlafaxine (EFFEXOR-XR) 24 hr capsule 75 mg  75 mg Oral QPC breakfast Toy Baker, MD   75 mg at 05/20/11 1016  . DISCONTD: zolpidem (AMBIEN) tablet 10 mg  10 mg Oral QHS Joya Gaskins, MD    10 mg at 05/17/11 2248   Medications Prior to Admission  Medication Sig Dispense Refill  . ALPRAZolam (XANAX) 0.5 MG tablet Take 0.5 mg by mouth 4 (four) times daily as needed. For anxiety       . amLODipine (NORVASC) 10 MG tablet Take 1 tablet (10 mg total) by mouth daily.  30 tablet  3  . aspirin 81 MG tablet Take 1 tablet (81 mg total) by mouth daily.      . carvedilol (COREG) 25 MG tablet Take 25 mg by mouth 2 (two) times daily with a meal.        . Cholecalciferol (VITAMIN D3) 2000 UNITS TABS Take 2 tablets by mouth daily.        . citalopram (CELEXA) 40 MG tablet Take 1 tablet (40 mg total) by mouth daily.      Marland Kitchen HYDROcodone-acetaminophen (NORCO) 10-325 MG per tablet Take 1 tablet by mouth every 4 (four) hours as needed. For pain       . insulin aspart protamine-insulin aspart (NOVOLOG 70/30) (70-30) 100 UNIT/ML injection Inject 40-50 Units into the skin 2 (two) times daily with a meal. Take 50 units in the morning and 40 units in the evening      . insulin glargine (LANTUS) 100 UNIT/ML injection Inject 60 Units into the skin 2 (two) times daily.       . isosorbide mononitrate (IMDUR) 30 MG 24 hr tablet Take 30 mg by mouth at bedtime. Take whole tablet daily at night       . lisinopril (PRINIVIL,ZESTRIL) 5 MG tablet Take 2.5 mg by mouth daily.        . Multiple Vitamin (MULTIVITAMIN) tablet Take 1 tablet by mouth daily.       . nitroGLYCERIN (NITROSTAT) 0.4 MG SL tablet Place 1 tablet (0.4 mg total) under the tongue every 5 (five) minutes as needed for chest pain (up to 3 doses).  25 tablet  4  . omeprazole (PRILOSEC) 20 MG capsule Take 20 mg by mouth 2 (two) times daily before a meal.        . Pramlintide Acetate (SYMLINPEN 120) 2700 MCG/2.7ML SOLN Inject 60 Units into the skin 2 (two) times daily before a meal.        . simvastatin (ZOCOR) 40 MG tablet Take 20 mg by mouth at bedtime.        Marland Kitchen zolpidem (AMBIEN) 10 MG tablet Take 10 mg by mouth at bedtime as needed. For insomnia         OB/GYN Status:  No LMP for male patient.  General Assessment Data Assessment Number: 5  Living Arrangements: Spouse/significant other Can pt return to current living arrangement?: Yes Admission Status: Voluntary Is patient capable of signing voluntary admission?: Yes Transfer from: Acute Hospital Referral Source: Self/Family/Friend  Risk to self Suicidal Ideation: No-Not Currently/Within Last 6 Months Suicidal Intent: No-Not Currently/Within Last 6 Months Is patient at risk for suicide?: No Suicidal Plan?: No-Not Currently/Within Last 6 Months Specify Current Suicidal Plan:  (none reported) Access to Means: No Specify Access to Suicidal Means:  (n/a) What has been your use of drugs/alcohol within the last 12 months?: ETOH on occassion Other Self Harm Risks:  (n/a) Triggers for Past Attempts: None known Intentional Self Injurious Behavior: None Factors that decrease suicide risk: Absense of psychosis Family Suicide History: Yes Recent stressful life event(s): Financial Problems Persecutory voices/beliefs?: No Depression: Yes Depression Symptoms: Loss of  interest in usual pleasures;Despondent Substance abuse history and/or treatment for substance abuse?: No Suicide prevention information given to non-admitted patients: Not applicable  Risk to Others Homicidal Ideation: No Thoughts of Harm to Others: No Comment - Thoughts of Harm to Others: n/a Current Homicidal Intent: No Current Homicidal Plan: No Access to Homicidal Means: No Describe Access to Homicidal Means: None Reported Identified Victim:  (Grandson?) History of harm to others?: No Assessment of Violence: None Noted Violent Behavior Description:  (None noted) Does patient have access to weapons?: No Criminal Charges Pending?: No Does patient have a court date: No  Mental Status Report Appear/Hygiene: Improved Eye Contact: Good Motor Activity: Unremarkable Speech: Slow;Logical/coherent Level of  Consciousness: Alert Mood: Other (Comment) (Appropriate to circumstance) Affect: Appropriate to circumstance Anxiety Level: None Thought Processes: Coherent Judgement: Unimpaired Orientation: Person;Place;Time;Situation Obsessive Compulsive Thoughts/Behaviors: None  Cognitive Functioning Concentration: Normal Memory: Recent Intact;Remote Intact IQ: Average Insight: Good Impulse Control: Fair Appetite: Fair Weight Loss:  (unknown) Weight Gain:  (unknown) Sleep: No Change Total Hours of Sleep:  (approx. 4 hours) Vegetative Symptoms: None  Prior Inpatient/Outpatient Therapy Prior Therapy: Outpatient Prior Therapy Dates: 2011 & 2012 Prior Therapy Facilty/Provider(s): [facility] Reason for Treatment: depression/anxiety            Values / Beliefs Cultural Requests During Hospitalization: None Spiritual Requests During Hospitalization: None        Additional Information 1:1 In Past 12 Months?: No CIRT Risk: No Elopement Risk: No Does patient have medical clearance?: Yes  Child/Adolescent Assessment Running Away Risk: Denies Bed-Wetting: Denies Destruction of Property: Denies Cruelty to Animals: Denies Stealing: Denies Rebellious/Defies Authority: Denies Satanic Involvement: Denies Archivist: Denies Problems at [employer]: Denies Gang Involvement: Denies  Disposition:  Disposition Disposition of Patient: Outpatient treatment;Referred to Type of inpatient treatment program: Adult Type of outpatient treatment: Adult Patient referred to: Outpatient clinic referral  On Site Evaluation by:  S. Peggye Pitt, MD Reviewed with Physician:   K. Denton Lank, MD   Ileene Hutchinson 05/21/2011 11:03 AM

## 2011-05-21 NOTE — ED Provider Notes (Addendum)
Pt content, alert. Vitals stable. [facility]waiting [facility] Dr A to round on pt.   Dr Rogers Blocker has rounded on pt. Pt feeling improved. No thought of self harm. Pt contracts for safety. [facility] has arranged close f/u tomorrow at [facility], pt agreeable w follow up plan.  Pt w normal mood/affect, agrees w d/c plan, states will follow up as arranged.   Act team/Dr Ahluwalia request we give pt initial rx wellbutrin 75 mg a day, and seroquel 100 mg qhs.  Suzi Roots, MD 05/21/11 1610  Suzi Roots, MD 05/21/11 1119  Suzi Roots, MD 05/21/11 1136

## 2011-05-30 ENCOUNTER — Encounter: Payer: Self-pay | Admitting: Cardiology

## 2011-06-03 ENCOUNTER — Other Ambulatory Visit: Payer: Medicare Other | Admitting: *Deleted

## 2011-06-03 ENCOUNTER — Ambulatory Visit: Payer: Medicare Other | Admitting: Cardiology

## 2011-10-21 ENCOUNTER — Encounter: Payer: Self-pay | Admitting: Cardiology

## 2011-10-21 ENCOUNTER — Ambulatory Visit (INDEPENDENT_AMBULATORY_CARE_PROVIDER_SITE_OTHER): Payer: Medicare Other | Admitting: Cardiology

## 2011-10-21 VITALS — BP 130/86 | HR 100 | Ht 76.0 in | Wt 273.0 lb

## 2011-10-21 DIAGNOSIS — I119 Hypertensive heart disease without heart failure: Secondary | ICD-10-CM

## 2011-10-21 DIAGNOSIS — F419 Anxiety disorder, unspecified: Secondary | ICD-10-CM

## 2011-10-21 DIAGNOSIS — E7800 Pure hypercholesterolemia, unspecified: Secondary | ICD-10-CM

## 2011-10-21 DIAGNOSIS — Z951 Presence of aortocoronary bypass graft: Secondary | ICD-10-CM

## 2011-10-21 DIAGNOSIS — I2510 Atherosclerotic heart disease of native coronary artery without angina pectoris: Secondary | ICD-10-CM

## 2011-10-21 DIAGNOSIS — F411 Generalized anxiety disorder: Secondary | ICD-10-CM

## 2011-10-21 DIAGNOSIS — E785 Hyperlipidemia, unspecified: Secondary | ICD-10-CM

## 2011-10-21 NOTE — Assessment & Plan Note (Signed)
His depression has been better since his psychiatrist put him on Wellbutrin.  The patient continues also to have Xanax on hand.  We have asked him to get his Xanax from his psychiatrist

## 2011-10-21 NOTE — Progress Notes (Signed)
Tyler Le Date of Birth:  10-25-1945 [facility] [address] [place], [place]  [address] [phone]         Fax   [phone]  History of Present Illness: This pleasant 66 year old gentleman is seen for a scheduled followup office visit.  He has a history of known ischemic heart disease.  He underwent coronary artery bypass graft surgery in 2001.  In March 2012 he underwent bare-metal stent placement to the saphenous vein graft to the intermediate and obtuse marginal.  He has been on aspirin 81.  He has not been experiencing any recurrent chest pain or angina.  He has not been getting any regular exercise.  According to his wife the patient has been in and out of the hospital for depression since we last saw him.  He is being treated for his diabetes at the [facility].  He did have back surgery sometime last year and since then his depression has been worse.  He is seeing a psychiatrist and is on antidepressants.  Current Outpatient Prescriptions  Medication Sig Dispense Refill  . ALPRAZolam (XANAX) 0.5 MG tablet Take 0.5 mg by mouth 4 (four) times daily as needed. For anxiety       . amLODipine (NORVASC) 10 MG tablet Take 1 tablet (10 mg total) by mouth daily.  30 tablet  3  . aspirin 81 MG tablet Take 1 tablet (81 mg total) by mouth daily.      Marland Kitchen buPROPion (WELLBUTRIN) 75 MG tablet Take 1 tablet (75 mg total) by mouth daily.  15 tablet  0  . carvedilol (COREG) 25 MG tablet Take 25 mg by mouth 2 (two) times daily with a meal.        . Cholecalciferol (VITAMIN D3) 2000 UNITS TABS Take 2 tablets by mouth daily.        Marland Kitchen HYDROcodone-acetaminophen (NORCO) 10-325 MG per tablet Take 1 tablet by mouth every 4 (four) hours as needed. For pain       . insulin glargine (LANTUS) 100 UNIT/ML injection Inject 60 Units into the skin 2 (two) times daily.       . isosorbide mononitrate (IMDUR) 30 MG 24 hr tablet Take 30 mg by mouth at bedtime. Take whole tablet daily at  night       . lisinopril (PRINIVIL,ZESTRIL) 5 MG tablet Take 2.5 mg by mouth daily.        . Multiple Vitamin (MULTIVITAMIN) tablet Take 1 tablet by mouth daily.       . nitroGLYCERIN (NITROSTAT) 0.4 MG SL tablet Place 1 tablet (0.4 mg total) under the tongue every 5 (five) minutes as needed for chest pain (up to 3 doses).  25 tablet  4  . omeprazole (PRILOSEC) 20 MG capsule Take 20 mg by mouth 2 (two) times daily before a meal.        . Pramlintide Acetate (SYMLINPEN 120) 2700 MCG/2.7ML SOLN Inject 60 Units into the skin 2 (two) times daily before a meal.        . simvastatin (ZOCOR) 40 MG tablet Take 20 mg by mouth at bedtime.        Marland Kitchen zolpidem (AMBIEN) 10 MG tablet Take 10 mg by mouth at bedtime as needed. For insomnia      . insulin aspart protamine-insulin aspart (NOVOLOG 70/30) (70-30) 100 UNIT/ML injection Inject 40-50 Units into the skin 2 (two) times daily with a meal. Take 50 units in the morning and 40 units in the evening  Allergies  Allergen Reactions  . Metformin And Related Nausea And Vomiting  . Pravachol Nausea And Vomiting  . Ramipril Nausea Only  . Wellbutrin (Bupropion Hcl) Nausea And Vomiting    Patient Active Problem List  Diagnoses  . S/P CABG (coronary artery bypass graft)  . Angina pectoris  . Diabetes mellitus  . Dyslipidemia  . Low back pain  . Anxiety  . Benign hypertensive heart disease without heart failure  . CAD (coronary artery disease)  . Depression    History  Smoking status  . Former Smoker -- 4.0 packs/day for 25 years  . Types: Cigarettes  Smokeless tobacco  . Former Neurosurgeon  . Types: Chew  Comment: "quit smoking ~ 1985"    History  Alcohol Use No    "might take a drink 3 X/yr"    Family History  Problem Relation Age of Onset  . Heart disease Mother   . Heart disease Father   . Heart attack Father   . Heart disease Brother     Review of Systems: Constitutional: no fever chills diaphoresis or fatigue or change in weight.    Head and neck: no hearing loss, no epistaxis, no photophobia or visual disturbance. Respiratory: No cough, shortness of breath or wheezing. Cardiovascular: No chest pain peripheral edema, palpitations. Gastrointestinal: No abdominal distention, no abdominal pain, no change in bowel habits hematochezia or melena. Genitourinary: No dysuria, no frequency, no urgency, no nocturia. Musculoskeletal:No arthralgias, no back pain, no gait disturbance or myalgias. Neurological: No dizziness, no headaches, no numbness, no seizures, no syncope, no weakness, no tremors. Hematologic: No lymphadenopathy, no easy bruising. Psychiatric: No confusion, no hallucinations, no sleep disturbance.    Physical Exam: Filed Vitals:   10/21/11 1416  BP: 130/86  Pulse: 100   the general appearance reveals a well-developed well-nourished gentleman in no distress.Pupils equal and reactive.   Extraocular Movements are full.  There is no scleral icterus.  The mouth and pharynx are normal.  The neck is supple.  The carotids reveal no bruits.  The jugular venous pressure is normal.  The thyroid is not enlarged.  There is no lymphadenopathy.  The chest is clear to percussion and auscultation. There are no rales or rhonchi. Expansion of the chest is symmetrical.  The precordium is quiet.  The first heart sound is normal.  The second heart sound is physiologically split.  There is no murmur gallop rub or click.  There is no abnormal lift or heave.  The abdomen is soft and nontender. Bowel sounds are normal. The liver and spleen are not enlarged. There Are no abdominal masses. There are no bruits.  The pedal pulses are good.  There is no phlebitis or edema.  There is no cyanosis or clubbing.   Assessment / Plan:  Continue same medication.  Recheck in 6 months for followup office visit EKG and fasting lab work

## 2011-10-21 NOTE — Progress Notes (Signed)
Patient ID: Tyler Le, male   DOB: Jan 16, 1946, 66 y.o.   MRN: [id number]

## 2011-10-21 NOTE — Assessment & Plan Note (Signed)
The patient has a history of dyslipidemia.  He is on simvastatin 20 mg at bedtime.  Is not having any side effects from the simvastatin

## 2011-10-21 NOTE — Assessment & Plan Note (Signed)
Patient has not been expressing any recurrent chest pain or angina.  He has not been aware of any palpitations dizziness or syncope.

## 2011-10-21 NOTE — Patient Instructions (Signed)
Your physician recommends that you continue on your current medications as directed. Please refer to the Current Medication list given to you today.  Your physician wants you to follow-up in: 6 months. You will receive a reminder letter in the mail two months in advance. If you don't receive a letter, please call our office to schedule the follow-up appointment.  

## 2012-05-04 ENCOUNTER — Ambulatory Visit (INDEPENDENT_AMBULATORY_CARE_PROVIDER_SITE_OTHER): Payer: Medicare Other | Admitting: Cardiology

## 2012-05-04 ENCOUNTER — Encounter: Payer: Self-pay | Admitting: Cardiology

## 2012-05-04 VITALS — BP 120/72 | HR 93 | Resp 18 | Ht 73.0 in | Wt 279.4 lb

## 2012-05-04 DIAGNOSIS — Z951 Presence of aortocoronary bypass graft: Secondary | ICD-10-CM

## 2012-05-04 DIAGNOSIS — F329 Major depressive disorder, single episode, unspecified: Secondary | ICD-10-CM

## 2012-05-04 DIAGNOSIS — E785 Hyperlipidemia, unspecified: Secondary | ICD-10-CM

## 2012-05-04 DIAGNOSIS — I119 Hypertensive heart disease without heart failure: Secondary | ICD-10-CM

## 2012-05-04 MED ORDER — NITROGLYCERIN 0.4 MG SL SUBL: 0.4000 mg | SUBLINGUAL_TABLET | SUBLINGUAL | Status: DC | PRN

## 2012-05-04 NOTE — Patient Instructions (Addendum)
Your physician recommends that you continue on your current medications as directed. Please refer to the Current Medication list given to you today.  Your physician wants you to follow-up in: 6 months You will receive a reminder letter in the mail two months in advance. If you don't receive a letter, please call our office to schedule the follow-up appointment.   Increase your exercise

## 2012-05-04 NOTE — Assessment & Plan Note (Signed)
Patient has a history of dyslipidemia.  He is not getting any regular exercise.  His weight is up 6 pounds since last visit.  His lipids are being followed at the [facility]

## 2012-05-04 NOTE — Progress Notes (Signed)
Tyler Le Date of Birth:  1946/03/09 [facility] [address] [place], [place]  [address] [phone]         Fax   [phone]  History of Present Illness: This pleasant 66 year old gentleman is seen for a scheduled followup office visit. He has a history of known ischemic heart disease. He underwent coronary artery bypass graft surgery in 2001. In March 2012 he underwent bare-metal stent placement to the saphenous vein graft to the intermediate and obtuse marginal. He has been on aspirin 81. He has not been experiencing any recurrent chest pain or angina. He has not been getting any regular exercise. According to his wife the patient has been in and out of the hospital for depression since we last saw him. He is being treated for his diabetes at the [facility]. He did have back surgery sometime last year and since then his depression has been worse. He is seeing a psychiatrist and is on antidepressants.  Since last visit he has had no new cardiac complaints.  Current Outpatient Prescriptions  Medication Sig Dispense Refill  . ALPRAZolam (XANAX) 0.5 MG tablet Take 0.5 mg by mouth 4 (four) times daily as needed. For anxiety       . amLODipine (NORVASC) 10 MG tablet Take 1 tablet (10 mg total) by mouth daily.  30 tablet  3  . aspirin 81 MG tablet Take 1 tablet (81 mg total) by mouth daily.      . Blood Glucose Monitoring Suppl (FREESTYLE FREEDOM) KIT       . busPIRone (BUSPAR) 15 MG tablet       . carvedilol (COREG) 25 MG tablet Take 25 mg by mouth 2 (two) times daily with a meal.        . Cholecalciferol (VITAMIN D3) 2000 UNITS TABS Take 2 tablets by mouth daily.        . CYMBALTA 60 MG capsule       . HYDROcodone-acetaminophen (NORCO) 10-325 MG per tablet Take 1 tablet by mouth every 4 (four) hours as needed. For pain       . insulin aspart protamine-insulin aspart (NOVOLOG 70/30) (70-30) 100 UNIT/ML injection Inject 40-50 Units into the skin 2 (two) times  daily with a meal. Take 50 units in the morning and 40 units in the evening      . insulin glargine (LANTUS) 100 UNIT/ML injection Inject 60 Units into the skin 2 (two) times daily.       . isosorbide mononitrate (IMDUR) 30 MG 24 hr tablet Take 30 mg by mouth at bedtime. Take whole tablet daily at night       . lisinopril (PRINIVIL,ZESTRIL) 5 MG tablet Take 2.5 mg by mouth daily.        . mirtazapine (REMERON) 15 MG tablet       . Multiple Vitamin (MULTIVITAMIN) tablet Take 1 tablet by mouth daily.       . nitroGLYCERIN (NITROSTAT) 0.4 MG SL tablet Place 1 tablet (0.4 mg total) under the tongue every 5 (five) minutes as needed for chest pain (up to 3 doses).  100 tablet  4  . omeprazole (PRILOSEC) 20 MG capsule Take 20 mg by mouth 2 (two) times daily before a meal.        . Pramlintide Acetate (SYMLINPEN 120) 2700 MCG/2.7ML SOLN Inject 60 Units into the skin 2 (two) times daily before a meal.        . QUEtiapine (SEROQUEL) 100 MG tablet Take 100  mg by mouth at bedtime.      Marland Kitchen QUEtiapine (SEROQUEL) 400 MG tablet 300 mg as directed.       . simvastatin (ZOCOR) 40 MG tablet Take 20 mg by mouth at bedtime.        Marland Kitchen zolpidem (AMBIEN) 10 MG tablet Take 10 mg by mouth at bedtime as needed. For insomnia      . [DISCONTINUED] nitroGLYCERIN (NITROSTAT) 0.4 MG SL tablet Place 1 tablet (0.4 mg total) under the tongue every 5 (five) minutes as needed for chest pain (up to 3 doses).  25 tablet  4  . [DISCONTINUED] QUEtiapine (SEROQUEL) 100 MG tablet Take 1 tablet (100 mg total) by mouth at bedtime.  15 tablet  0    Allergies  Allergen Reactions  . Metformin And Related Nausea And Vomiting  . Pravachol Nausea And Vomiting  . Ramipril Nausea Only  . Wellbutrin (Bupropion Hcl) Nausea And Vomiting    Patient Active Problem List  Diagnosis  . S/P CABG (coronary artery bypass graft)  . Angina pectoris  . Diabetes mellitus  . Dyslipidemia  . Low back pain  . Anxiety  . Benign hypertensive heart disease  without heart failure  . CAD (coronary artery disease)  . Depression    History  Smoking status  . Former Smoker -- 4.0 packs/day for 25 years  . Types: Cigarettes  Smokeless tobacco  . Former Neurosurgeon  . Types: Chew    Comment: "quit smoking ~ 1985"    History  Alcohol Use No    Comment: "might take a drink 3 X/yr"    Family History  Problem Relation Age of Onset  . Heart disease Mother   . Heart disease Father   . Heart attack Father   . Heart disease Brother     Review of Systems: Constitutional: no fever chills diaphoresis or fatigue or change in weight.  Head and neck: no hearing loss, no epistaxis, no photophobia or visual disturbance. Respiratory: No cough, shortness of breath or wheezing. Cardiovascular: No chest pain peripheral edema, palpitations. Gastrointestinal: No abdominal distention, no abdominal pain, no change in bowel habits hematochezia or melena. Genitourinary: No dysuria, no frequency, no urgency, no nocturia. Musculoskeletal:No arthralgias, no back pain, no gait disturbance or myalgias. Neurological: No dizziness, no headaches, no numbness, no seizures, no syncope, no weakness, no tremors. Hematologic: No lymphadenopathy, no easy bruising. Psychiatric: No confusion, no hallucinations, no sleep disturbance.    Physical Exam: Filed Vitals:   05/04/12 1104  BP: 120/72  Pulse: 93  Resp: 18   the general appearance reveals a large gentleman in no acute distress.The head and neck exam reveals pupils equal and reactive.  Extraocular movements are full.  There is no scleral icterus.  The mouth and pharynx are normal.  The neck is supple.  The carotids reveal no bruits.  The jugular venous pressure is normal.  The  thyroid is not enlarged.  There is no lymphadenopathy.  The chest is clear to percussion and auscultation.  There are no rales or rhonchi.  Expansion of the chest is symmetrical.  The precordium is quiet.  The first heart sound is normal.  The  second heart sound is physiologically split.  There is no murmur gallop rub or click.  There is no abnormal lift or heave.  The abdomen is soft and nontender.  The bowel sounds are normal.  The liver and spleen are not enlarged.  There are no abdominal masses.  There are no abdominal  bruits.  Extremities reveal good pedal pulses.  There is no phlebitis or edema.  There is no cyanosis or clubbing.  Strength is normal and symmetrical in all extremities.  There is no lateralizing weakness.  There are no sensory deficits.  The skin is warm and dry.  There is no rash.  EKG today shows normal sinus rhythm and prolonged QT interval and nonspecific T-wave flattening  Assessment / Plan: Continue same medication.  Encouraged him to try to increase his exercise and to lose weight.  Recheck in 6 months for followup office visit.

## 2012-05-04 NOTE — Assessment & Plan Note (Signed)
The patient has not been experiencing any recurrent angina pectoris.  He has not had to take any recent sublingual nitroglycerin.  He has lost his bottle of nitroglycerin and we are calling in a new supply to his drug store for him.

## 2012-05-04 NOTE — Assessment & Plan Note (Signed)
The patient is now on multiple antidepressants.  He is followed by psychiatry.  Some of his weight gain may be attributable to his antidepressant medication as well as to his inactivity.  Overall his depression appears to be somewhat better than it was when we last saw him 6 months ago

## 2012-08-11 ENCOUNTER — Other Ambulatory Visit: Payer: Self-pay | Admitting: Physician Assistant

## 2012-08-11 DIAGNOSIS — M47812 Spondylosis without myelopathy or radiculopathy, cervical region: Secondary | ICD-10-CM

## 2012-08-18 ENCOUNTER — Ambulatory Visit
Admission: RE | Admit: 2012-08-18 | Discharge: 2012-08-18 | Disposition: A | Discharge status: Home / Self Care | Payer: Medicare Other | Source: Ambulatory Visit | Attending: Physician Assistant | Admitting: Physician Assistant

## 2012-08-18 DIAGNOSIS — M47812 Spondylosis without myelopathy or radiculopathy, cervical region: Secondary | ICD-10-CM

## 2013-02-03 ENCOUNTER — Encounter: Payer: Self-pay | Admitting: Cardiology

## 2013-02-03 ENCOUNTER — Ambulatory Visit (INDEPENDENT_AMBULATORY_CARE_PROVIDER_SITE_OTHER): Payer: Medicare Other | Admitting: Cardiology

## 2013-02-03 VITALS — BP 130/82 | HR 64 | Ht 76.0 in | Wt 284.0 lb

## 2013-02-03 DIAGNOSIS — I2510 Atherosclerotic heart disease of native coronary artery without angina pectoris: Secondary | ICD-10-CM

## 2013-02-03 DIAGNOSIS — I119 Hypertensive heart disease without heart failure: Secondary | ICD-10-CM

## 2013-02-03 DIAGNOSIS — I259 Chronic ischemic heart disease, unspecified: Secondary | ICD-10-CM

## 2013-02-03 NOTE — Patient Instructions (Addendum)
Your physician recommends that you continue on your current medications as directed. Please refer to the Current Medication list given to you today.  Your physician wants you to follow-up in: 6 months OV  You will receive a reminder letter in the mail two months in advance. If you don't receive a letter, please call our office to schedule the follow-up appointment.  

## 2013-02-03 NOTE — Assessment & Plan Note (Signed)
The patient has not been experiencing any chest pain or angina.  He has not had to take any sublingual nitroglycerin.

## 2013-02-03 NOTE — Assessment & Plan Note (Signed)
The patient is not having any hypoglycemic reaction.  He is relatively sedentary.  His energy level is low

## 2013-02-03 NOTE — Assessment & Plan Note (Signed)
Blood pressure was remaining stable on current therapy 

## 2013-02-03 NOTE — Progress Notes (Signed)
Tyler Le Date of Birth:  1946-06-04 [facility] [address] [place], [place]  [address] [phone]         Fax   [phone]  History of Present Illness: This pleasant 67 year old gentleman is seen for a scheduled followup office visit. He has a history of known ischemic heart disease. He underwent coronary artery bypass graft surgery in 2001. In March 2012 he underwent bare-metal stent placement to the saphenous vein graft to the intermediate and obtuse marginal. He has been on aspirin 81. He has not been experiencing any recurrent chest pain or angina. He has not been getting any regular exercise. According to his wife the patient has been in and out of the hospital for depression since we last saw him. He is being treated for his diabetes at the [facility]. He did have back surgery sometime last year and since then his depression has been worse. He is seeing a psychiatrist in [place] and is on antidepressants. Since last visit he has had no new cardiac complaints.  He has gained 5 pounds since last visit which he attributes to his psychiatric medication.   Current Outpatient Prescriptions  Medication Sig Dispense Refill  . amLODipine (NORVASC) 10 MG tablet Take 1 tablet (10 mg total) by mouth daily.  30 tablet  3  . aspirin 81 MG tablet Take 1 tablet (81 mg total) by mouth daily.      . Blood Glucose Monitoring Suppl (FREESTYLE FREEDOM) KIT       . busPIRone (BUSPAR) 15 MG tablet       . carvedilol (COREG) 25 MG tablet Take 25 mg by mouth 2 (two) times daily with a meal.        . Cholecalciferol (VITAMIN D3) 2000 UNITS TABS Take 2 tablets by mouth daily.        . CYMBALTA 60 MG capsule       . HYDROcodone-acetaminophen (NORCO) 10-325 MG per tablet Take 1 tablet by mouth every 4 (four) hours as needed. For pain       . insulin aspart protamine-insulin aspart (NOVOLOG 70/30) (70-30) 100 UNIT/ML injection Inject 40-50 Units into the skin 2 (two) times  daily with a meal. Take 50 units in the morning and 40 units in the evening      . insulin glargine (LANTUS) 100 UNIT/ML injection Inject 60 Units into the skin 2 (two) times daily.       . isosorbide mononitrate (IMDUR) 30 MG 24 hr tablet Take 30 mg by mouth at bedtime. Take whole tablet daily at night       . lisinopril (PRINIVIL,ZESTRIL) 5 MG tablet Take 2.5 mg by mouth daily.        . mirtazapine (REMERON) 15 MG tablet       . Multiple Vitamin (MULTIVITAMIN) tablet Take 1 tablet by mouth daily.       . nitroGLYCERIN (NITROSTAT) 0.4 MG SL tablet Place 1 tablet (0.4 mg total) under the tongue every 5 (five) minutes as needed for chest pain (up to 3 doses).  100 tablet  4  . omeprazole (PRILOSEC) 20 MG capsule Take 20 mg by mouth 2 (two) times daily before a meal.        . Pramlintide Acetate (SYMLINPEN 120) 2700 MCG/2.7ML SOLN Inject 60 Units into the skin 2 (two) times daily before a meal.        . QUEtiapine (SEROQUEL) 400 MG tablet 300 mg as directed.       Marland Kitchen  simvastatin (ZOCOR) 40 MG tablet Take 20 mg by mouth at bedtime.         No current facility-administered medications for this visit.    Allergies  Allergen Reactions  . Metformin And Related Nausea And Vomiting  . Pravachol Nausea And Vomiting  . Ramipril Nausea Only  . Wellbutrin [Bupropion Hcl] Nausea And Vomiting    Patient Active Problem List   Diagnosis Date Noted  . S/P CABG (coronary artery bypass graft) 10/03/2010    Priority: High  . Angina pectoris 10/03/2010    Priority: Medium  . Diabetes mellitus 10/03/2010    Priority: Medium  . Low back pain 10/03/2010    Priority: Medium  . Anxiety 10/03/2010    Priority: Medium  . Depression 05/08/2011  . CAD (coronary artery disease) 01/15/2011  . Dyslipidemia 10/03/2010  . Benign hypertensive heart disease without heart failure 10/03/2010    History  Smoking status  . Former Smoker -- 4.00 packs/day for 25 years  . Types: Cigarettes  Smokeless tobacco  .  Former Neurosurgeon  . Types: Chew    Comment: "quit smoking ~ 1985"    History  Alcohol Use No    Comment: "might take a drink 3 X/yr"    Family History  Problem Relation Age of Onset  . Heart disease Mother   . Heart disease Father   . Heart attack Father   . Heart disease Brother     Review of Systems: Constitutional: no fever chills diaphoresis or fatigue or change in weight.  Head and neck: no hearing loss, no epistaxis, no photophobia or visual disturbance. Respiratory: No cough, shortness of breath or wheezing. Cardiovascular: No chest pain peripheral edema, palpitations. Gastrointestinal: No abdominal distention, no abdominal pain, no change in bowel habits hematochezia or melena. Genitourinary: No dysuria, no frequency, no urgency, no nocturia. Musculoskeletal:No arthralgias, no back pain, no gait disturbance or myalgias. Neurological: No dizziness, no headaches, no numbness, no seizures, no syncope, no weakness, no tremors. Hematologic: No lymphadenopathy, no easy bruising. Psychiatric: No confusion, no hallucinations, no sleep disturbance.    Physical Exam: Filed Vitals:   02/03/13 1446  BP: 130/82  Pulse: 64   the general appearance is that of a large elderly gentleman in no distress.The head and neck exam reveals pupils equal and reactive.  Extraocular movements are full.  There is no scleral icterus.  The mouth and pharynx are normal.  The neck is supple.  The carotids reveal no bruits.  The jugular venous pressure is normal.  The  thyroid is not enlarged.  There is no lymphadenopathy.  The chest is clear to percussion and auscultation.  There are no rales or rhonchi.  Expansion of the chest is symmetrical.  The precordium is quiet.  The first heart sound is normal.  The second heart sound is physiologically split.  There is no murmur gallop rub or click.  There is no abnormal lift or heave.  The abdomen is soft and nontender.  The bowel sounds are normal.  The liver and  spleen are not enlarged.  There are no abdominal masses.  There are no abdominal bruits.  Extremities reveal good pedal pulses.  There is no phlebitis or edema.  There is no cyanosis or clubbing.  Strength is normal and symmetrical in all extremities.  There is no lateralizing weakness.  There are no sensory deficits.  The skin is warm and dry.  There is no rash.     Assessment / Plan: Continue same  medication.  Try to lose weight.  Recheck 6 months for office visit and EKG

## 2013-09-01 ENCOUNTER — Encounter: Payer: Self-pay | Admitting: Cardiology

## 2013-09-01 ENCOUNTER — Ambulatory Visit (INDEPENDENT_AMBULATORY_CARE_PROVIDER_SITE_OTHER): Payer: Medicare Other | Admitting: Cardiology

## 2013-09-01 ENCOUNTER — Encounter (INDEPENDENT_AMBULATORY_CARE_PROVIDER_SITE_OTHER): Payer: Self-pay

## 2013-09-01 VITALS — BP 140/84 | HR 90 | Ht 76.0 in | Wt 288.4 lb

## 2013-09-01 DIAGNOSIS — I119 Hypertensive heart disease without heart failure: Secondary | ICD-10-CM

## 2013-09-01 DIAGNOSIS — E1165 Type 2 diabetes mellitus with hyperglycemia: Secondary | ICD-10-CM

## 2013-09-01 DIAGNOSIS — Z951 Presence of aortocoronary bypass graft: Secondary | ICD-10-CM

## 2013-09-01 DIAGNOSIS — F3289 Other specified depressive episodes: Secondary | ICD-10-CM

## 2013-09-01 DIAGNOSIS — F32A Depression, unspecified: Secondary | ICD-10-CM

## 2013-09-01 DIAGNOSIS — I259 Chronic ischemic heart disease, unspecified: Secondary | ICD-10-CM

## 2013-09-01 DIAGNOSIS — F329 Major depressive disorder, single episode, unspecified: Secondary | ICD-10-CM

## 2013-09-01 DIAGNOSIS — I2510 Atherosclerotic heart disease of native coronary artery without angina pectoris: Secondary | ICD-10-CM

## 2013-09-01 DIAGNOSIS — IMO0001 Reserved for inherently not codable concepts without codable children: Secondary | ICD-10-CM

## 2013-09-01 NOTE — Progress Notes (Signed)
Kris Hartmann Date of Birth:  02-25-1946 [address] [place], [place]  [address] [phone]         Fax   [phone]  History of Present Illness: This pleasant 68 year old gentleman is seen for a scheduled followup office visit. He has a history of known ischemic heart disease. He underwent coronary artery bypass graft surgery in 2001. In March 2012 he underwent bare-metal stent placement to the saphenous vein graft to the intermediate and obtuse marginal. He has been on aspirin 81. He has not been experiencing any recurrent chest pain or angina. He has not been getting any regular exercise.  Since last visit his depression seems to be better. He is being treated for his diabetes at the [facility]. He did have back surgery sometime last year. He is seeing a psychiatrist in [place] and is on antidepressants. Since last visit he has had no new cardiac complaints.  He has gained 4 pounds since last visit which he attributes to his psychiatric medication.   Current Outpatient Prescriptions  Medication Sig Dispense Refill  . amLODipine (NORVASC) 10 MG tablet Take 1 tablet (10 mg total) by mouth daily.  30 tablet  3  . aspirin 81 MG tablet Take 1 tablet (81 mg total) by mouth daily.      . Blood Glucose Monitoring Suppl (FREESTYLE FREEDOM) KIT       . busPIRone (BUSPAR) 15 MG tablet       . carvedilol (COREG) 25 MG tablet Take 25 mg by mouth daily.       . Cholecalciferol (VITAMIN D3) 2000 UNITS TABS Take 2 tablets by mouth daily.        . CYMBALTA 60 MG capsule Take 60 mg by mouth daily.       Marland Kitchen HYDROcodone-acetaminophen (NORCO) 10-325 MG per tablet Take 1 tablet by mouth every 4 (four) hours as needed. For pain       . insulin aspart protamine-insulin aspart (NOVOLOG 70/30) (70-30) 100 UNIT/ML injection Inject 40-50 Units into the skin 2 (two) times daily with a meal. Take 50 units in the morning and 40 units in the evening      . insulin glargine (LANTUS) 100  UNIT/ML injection Inject 60 Units into the skin 2 (two) times daily.       . isosorbide mononitrate (IMDUR) 30 MG 24 hr tablet Take 30 mg by mouth at bedtime. Take whole tablet daily at night       . lisinopril (PRINIVIL,ZESTRIL) 5 MG tablet Take 2.5 mg by mouth daily.        . mirtazapine (REMERON) 15 MG tablet Take 15 mg by mouth at bedtime.       . Multiple Vitamin (MULTIVITAMIN) tablet Take 1 tablet by mouth daily.       . nitroGLYCERIN (NITROSTAT) 0.4 MG SL tablet Place 1 tablet (0.4 mg total) under the tongue every 5 (five) minutes as needed for chest pain (up to 3 doses).  100 tablet  4  . NOVOLOG FLEXPEN 100 UNIT/ML FlexPen       . omeprazole (PRILOSEC) 20 MG capsule Take 20 mg by mouth 2 (two) times daily before a meal.        . Pramlintide Acetate (SYMLINPEN 120) 2700 MCG/2.7ML SOLN Inject 60 Units into the skin 2 (two) times daily before a meal.        . QUEtiapine (SEROQUEL) 400 MG tablet 300 mg as directed.       Marland Kitchen  simvastatin (ZOCOR) 40 MG tablet Take 20 mg by mouth at bedtime.         No current facility-administered medications for this visit.    Allergies  Allergen Reactions  . Metformin And Related Nausea And Vomiting  . Pravachol Nausea And Vomiting  . Ramipril Nausea Only  . Wellbutrin [Bupropion Hcl] Nausea And Vomiting    Patient Active Problem List   Diagnosis Date Noted  . S/P CABG (coronary artery bypass graft) 10/03/2010    Priority: High  . Angina pectoris 10/03/2010    Priority: Medium  . Diabetes mellitus 10/03/2010    Priority: Medium  . Low back pain 10/03/2010    Priority: Medium  . Anxiety 10/03/2010    Priority: Medium  . Depression 05/08/2011  . CAD (coronary artery disease) 01/15/2011  . Dyslipidemia 10/03/2010  . Benign hypertensive heart disease without heart failure 10/03/2010    History  Smoking status  . Former Smoker -- 4.00 packs/day for 25 years  . Types: Cigarettes  Smokeless tobacco  . Former Systems developer  . Types: Chew    Comment:  "quit smoking ~ 1985"    History  Alcohol Use No    Comment: "might take a drink 3 X/yr"    Family History  Problem Relation Age of Onset  . Heart disease Mother   . Heart disease Father   . Heart attack Father   . Heart disease Brother     Review of Systems: Constitutional: no fever chills diaphoresis or fatigue or change in weight.  Head and neck: no hearing loss, no epistaxis, no photophobia or visual disturbance. Respiratory: No cough, shortness of breath or wheezing. Cardiovascular: No chest pain peripheral edema, palpitations. Gastrointestinal: No abdominal distention, no abdominal pain, no change in bowel habits hematochezia or melena. Genitourinary: No dysuria, no frequency, no urgency, no nocturia. Musculoskeletal:No arthralgias, no back pain, no gait disturbance or myalgias. Neurological: No dizziness, no headaches, no numbness, no seizures, no syncope, no weakness, no tremors. Hematologic: No lymphadenopathy, no easy bruising. Psychiatric: No confusion, no hallucinations, no sleep disturbance.    Physical Exam: Filed Vitals:   09/01/13 1523  BP: 140/84  Pulse:    the general appearance is that of a large elderly gentleman in no distress.The head and neck exam reveals pupils equal and reactive.  Extraocular movements are full.  There is no scleral icterus.  The mouth and pharynx are normal.  The neck is supple.  The carotids reveal no bruits.  The jugular venous pressure is normal.  The  thyroid is not enlarged.  There is no lymphadenopathy.  The chest is clear to percussion and auscultation.  There are no rales or rhonchi.  Expansion of the chest is symmetrical.  The precordium is quiet.  The first heart sound is normal.  The second heart sound is physiologically split.  There is no murmur gallop rub or click.  There is no abnormal lift or heave.  The abdomen is soft and nontender.  The bowel sounds are normal.  The liver and spleen are not enlarged.  There are no  abdominal masses.  There are no abdominal bruits.  Extremities reveal good pedal pulses.  There is no phlebitis or edema.  There is no cyanosis or clubbing.  Strength is normal and symmetrical in all extremities.  There is no lateralizing weakness.  There are no sensory deficits.  The skin is warm and dry.  There is no rash.  EKG shows normal sinus rhythm and right bundle  branch block and is unchanged from 05/04/12   Assessment / Plan: Continue same medication.  Try to lose weight.  Recheck 6 months for office visit

## 2013-09-01 NOTE — Assessment & Plan Note (Signed)
The patient has not had to take any recent sublingual nitroglycerin. 

## 2013-09-01 NOTE — Patient Instructions (Signed)
Your physician recommends that you continue on your current medications as directed. Please refer to the Current Medication list given to you today.  Your physician wants you to follow-up in: 6 month ov You will receive a reminder letter in the mail two months in advance. If you don't receive a letter, please call our office to schedule the follow-up appointment.  

## 2013-09-01 NOTE — Assessment & Plan Note (Signed)
Blood pressures remaining stable on current medication

## 2013-09-01 NOTE — Assessment & Plan Note (Signed)
The patient has not been having any hypoglycemic episodes 

## 2013-09-01 NOTE — Assessment & Plan Note (Signed)
The patient is less depressed since last visit.

## 2014-03-15 ENCOUNTER — Ambulatory Visit: Payer: Medicare Other | Admitting: Cardiology

## 2014-05-05 ENCOUNTER — Ambulatory Visit: Payer: Medicare Other | Admitting: Cardiology

## 2014-06-01 ENCOUNTER — Encounter (HOSPITAL_COMMUNITY): Payer: Self-pay | Admitting: Cardiovascular Disease

## 2014-06-14 ENCOUNTER — Ambulatory Visit
Admission: RE | Admit: 2014-06-14 | Discharge: 2014-06-14 | Disposition: A | Discharge status: Home / Self Care | Payer: Medicare Other | Source: Ambulatory Visit | Attending: Cardiology | Admitting: Cardiology

## 2014-06-14 ENCOUNTER — Ambulatory Visit (INDEPENDENT_AMBULATORY_CARE_PROVIDER_SITE_OTHER): Payer: Medicare Other | Admitting: Cardiology

## 2014-06-14 VITALS — BP 124/76 | HR 72 | Ht 76.0 in | Wt 287.0 lb

## 2014-06-14 DIAGNOSIS — I119 Hypertensive heart disease without heart failure: Secondary | ICD-10-CM

## 2014-06-14 DIAGNOSIS — R079 Chest pain, unspecified: Secondary | ICD-10-CM

## 2014-06-14 MED ORDER — NITROGLYCERIN 0.4 MG SL SUBL: 0.4000 mg | SUBLINGUAL_TABLET | SUBLINGUAL | Status: DC | PRN

## 2014-06-14 NOTE — Progress Notes (Signed)
Tyler Le Date of Birth:  02-Feb-1946 [address] [place], [place]  [address] [phone]         Fax   [phone]  History of Present Illness: This pleasant 68 year old gentleman is seen for a scheduled followup office visit. He has a history of known ischemic heart disease. He underwent coronary artery bypass graft surgery in 2001. In March 2012 he underwent bare-metal stent placement to the saphenous vein graft to the intermediate and obtuse marginal. He has been on aspirin 81.  Since last visit he has been experiencing more exertional chest tightness.. He has not been getting any regular exercise.  His weight remains elevated at 287.  Since last visit his depression seems to be better. He is being treated for his diabetes at the [facility]. He did have back surgery sometime last year.   Current Outpatient Prescriptions  Medication Sig Dispense Refill  . amLODipine (NORVASC) 10 MG tablet Take 1 tablet (10 mg total) by mouth daily. 30 tablet 3  . aspirin 81 MG tablet Take 1 tablet (81 mg total) by mouth daily.    . Blood Glucose Monitoring Suppl (FREESTYLE FREEDOM) KIT     . busPIRone (BUSPAR) 15 MG tablet Take 15 mg by mouth daily.     . carvedilol (COREG) 25 MG tablet Take 25 mg by mouth daily.     . Cholecalciferol (VITAMIN D3) 2000 UNITS TABS Take 2 tablets by mouth daily.      . CYMBALTA 60 MG capsule Take 60 mg by mouth daily.     Marland Kitchen HYDROcodone-acetaminophen (NORCO) 10-325 MG per tablet Take 1 tablet by mouth every 4 (four) hours as needed. For pain     . insulin aspart protamine-insulin aspart (NOVOLOG 70/30) (70-30) 100 UNIT/ML injection Inject 40-50 Units into the skin 2 (two) times daily with a meal. Take 50 units in the morning and 40 units in the evening    . insulin glargine (LANTUS) 100 UNIT/ML injection Inject 64 Units into the skin at bedtime.     . isosorbide mononitrate (IMDUR) 30 MG 24 hr tablet Take 30 mg by mouth at bedtime. Take whole  tablet daily at night     . lisinopril (PRINIVIL,ZESTRIL) 5 MG tablet Take 2.5 mg by mouth daily.      . mirtazapine (REMERON) 15 MG tablet Take 15 mg by mouth at bedtime.     . Multiple Vitamin (MULTIVITAMIN) tablet Take 1 tablet by mouth daily.     Marland Kitchen NOVOLOG FLEXPEN 100 UNIT/ML FlexPen     . omeprazole (PRILOSEC) 20 MG capsule Take 20 mg by mouth 2 (two) times daily before a meal.      . Pramlintide Acetate (SYMLINPEN 120) 2700 MCG/2.7ML SOLN Inject 60 Units into the skin 2 (two) times daily before a meal.      . QUEtiapine (SEROQUEL) 400 MG tablet 300 mg as directed.     . simvastatin (ZOCOR) 40 MG tablet Take 20 mg by mouth at bedtime.      . nitroGLYCERIN (NITROSTAT) 0.4 MG SL tablet Place 1 tablet (0.4 mg total) under the tongue every 5 (five) minutes as needed for chest pain (up to 3 doses). 100 tablet 4   No current facility-administered medications for this visit.    Allergies  Allergen Reactions  . Metformin And Related Nausea And Vomiting  . Pravachol Nausea And Vomiting  . Ramipril Nausea Only  . Wellbutrin [Bupropion Hcl] Nausea And Vomiting    Patient  Active Problem List   Diagnosis Date Noted  . S/P CABG (coronary artery bypass graft) 10/03/2010    Priority: High  . Angina pectoris 10/03/2010    Priority: Medium  . Type II or unspecified type diabetes mellitus without mention of complication, uncontrolled 10/03/2010    Priority: Medium  . Low back pain 10/03/2010    Priority: Medium  . Anxiety 10/03/2010    Priority: Medium  . Depression 05/08/2011  . CAD (coronary artery disease) 01/15/2011  . Dyslipidemia 10/03/2010  . Benign hypertensive heart disease without heart failure 10/03/2010    History  Smoking status  . Former Smoker -- 4.00 packs/day for 25 years  . Types: Cigarettes  Smokeless tobacco  . Former Systems developer  . Types: Chew    Comment: "quit smoking ~ 1985"    History  Alcohol Use No    Comment: "might take a drink 3 X/yr"    Family History    Problem Relation Age of Onset  . Heart disease Mother   . Heart disease Father   . Heart attack Father   . Heart disease Brother     Review of Systems: Constitutional: no fever chills diaphoresis or fatigue or change in weight.  Head and neck: no hearing loss, no epistaxis, no photophobia or visual disturbance. Respiratory: No cough, shortness of breath or wheezing. Cardiovascular: No chest pain peripheral edema, palpitations. Gastrointestinal: No abdominal distention, no abdominal pain, no change in bowel habits hematochezia or melena. Genitourinary: No dysuria, no frequency, no urgency, no nocturia. Musculoskeletal:No arthralgias, no back pain, no gait disturbance or myalgias. Neurological: No dizziness, no headaches, no numbness, no seizures, no syncope, no weakness, no tremors. Hematologic: No lymphadenopathy, no easy bruising. Psychiatric: No confusion, no hallucinations, no sleep disturbance.    Physical Exam: Filed Vitals:   06/14/14 1324  BP: 124/76  Pulse: 72   the general appearance is that of a large elderly gentleman in no distress.The head and neck exam reveals pupils equal and reactive.  Extraocular movements are full.  There is no scleral icterus.  The mouth and pharynx are normal.  The neck is supple.  The carotids reveal no bruits.  The jugular venous pressure is normal.  The  thyroid is not enlarged.  There is no lymphadenopathy.  The chest is clear to percussion and auscultation.  There are no rales or rhonchi.  Expansion of the chest is symmetrical.  The precordium is quiet.  The first heart sound is normal.  The second heart sound is physiologically split.  There is no murmur gallop rub or click.  There is no abnormal lift or heave.  The abdomen is soft and nontender.  The bowel sounds are normal.  The liver and spleen are not enlarged.  There are no abdominal masses.  There are no abdominal bruits.  Extremities reveal good pedal pulses.  There is no phlebitis or  edema.  There is no cyanosis or clubbing.  Strength is normal and symmetrical in all extremities.  There is no lateralizing weakness.  There are no sensory deficits.  The skin is warm and dry.  There is no rash.  EKG shows normal sinus rhythm and right bundle branch block and is unchanged from 09/01/13   Assessment / Plan: 1.  Ischemic heart disease with recent increase in exertional chest tightness. 2.  Status post CABG 3.  Right bundle branch block 4.  Depression 5.  Dyslipidemia 6.  Diabetes mellitus type 2 7.  Chronic low back pain  Disposition: He has been using more sublingual nitroglycerin.  He thinks that they may be outdated.  We will send him a new supply.  We will have him return for a 2 day Lexiscan Myoview stress test.  We will also update his chest x-ray. Recheck in 6 months for follow-up office visit

## 2014-06-14 NOTE — Patient Instructions (Signed)
Your physician recommends that you continue on your current medications as directed. Please refer to the Current Medication list given to you today.  Your physician has requested that you have a lexiscan myoview. For further information please visit [email or web address]. Please follow instruction sheet, as given.  A chest x-ray takes a picture of the organs and structures inside the chest, including the heart, lungs, and blood vessels. This test can show several things, including, whether the heart is enlarges; whether fluid is building up in the lungs; and whether pacemaker / defibrillator leads are still in place. [place] [facility]  Your physician wants you to follow-up in: 6 MONTH OV You will receive a reminder letter in the mail two months in advance. If you don't receive a letter, please call our office to schedule the follow-up appointment.

## 2014-06-15 ENCOUNTER — Telehealth: Payer: Self-pay | Admitting: *Deleted

## 2014-06-15 NOTE — Telephone Encounter (Signed)
Called pt to go over results from cxr. Wife said he was not home could she get results and tell pt. I advised did not see DPR however; will give her the results and to make sure pt gets DPR form filled out. Pt walked in while s/w wife, pt notified now.

## 2014-06-29 ENCOUNTER — Ambulatory Visit (HOSPITAL_COMMUNITY): Payer: Medicare Other | Attending: Family Medicine | Admitting: Radiology

## 2014-06-29 DIAGNOSIS — I2510 Atherosclerotic heart disease of native coronary artery without angina pectoris: Secondary | ICD-10-CM | POA: Insufficient documentation

## 2014-06-29 DIAGNOSIS — R0600 Dyspnea, unspecified: Secondary | ICD-10-CM

## 2014-06-29 DIAGNOSIS — R0789 Other chest pain: Secondary | ICD-10-CM | POA: Diagnosis present

## 2014-06-29 DIAGNOSIS — R0602 Shortness of breath: Secondary | ICD-10-CM | POA: Diagnosis not present

## 2014-06-29 DIAGNOSIS — I4510 Unspecified right bundle-branch block: Secondary | ICD-10-CM | POA: Diagnosis not present

## 2014-06-29 DIAGNOSIS — I119 Hypertensive heart disease without heart failure: Secondary | ICD-10-CM

## 2014-06-29 DIAGNOSIS — I10 Essential (primary) hypertension: Secondary | ICD-10-CM | POA: Diagnosis not present

## 2014-06-29 DIAGNOSIS — E119 Type 2 diabetes mellitus without complications: Secondary | ICD-10-CM | POA: Diagnosis not present

## 2014-06-29 DIAGNOSIS — R079 Chest pain, unspecified: Secondary | ICD-10-CM

## 2014-06-29 MED ORDER — REGADENOSON 0.4 MG/5ML IV SOLN
0.4000 mg | Freq: Once | INTRAVENOUS | Status: AC
  Administered 2014-06-29: 0.4 mg via INTRAVENOUS

## 2014-06-29 MED ORDER — TECHNETIUM TC 99M SESTAMIBI GENERIC - CARDIOLITE
30.0000 | Freq: Once | INTRAVENOUS | Status: AC | PRN
  Administered 2014-06-29: 30 via INTRAVENOUS

## 2014-06-29 NOTE — Progress Notes (Signed)
[facility] SITE [facility] NUCLEAR MED [address] [place], [place] [address] [phone]    Cardiology Nuclear Med Study  Tyler Le is a 69 y.o. male     MRN : [id number]     DOB: 03-02-1946  Procedure Date: 06/29/2014  Nuclear Med Background Indication for Stress Test:  Evaluation for Ischemia History:  CAD, MPI ~5 yrs ago (normal per pt.) Cardiac Risk Factors: Hypertension, IDDM Type 2 and RBBB  Symptoms:  Chest Tightness (last date of chest discomfort was one week ago), DOE and SOB   Nuclear Pre-Procedure Caffeine/Decaff Intake:  None NPO After: 9:30pm   Lungs:  clear O2 Sat: 91% on room air. IV 0.9% NS with Angio Cath:  22g  IV Site: R Hand  IV Started by:  Bonnita LevanJackie Haslam, RN  Chest Size (in):  50+ Cup Size: n/a  Height: 6\' 4"  (1.93 m)  Weight:  286 lb (129.729 kg)  BMI:  Body mass index is 34.83 kg/(m^2). Tech Comments:  N/A    Nuclear Med Study 1 or 2 day study: 2 day  Stress Test Type:  Lexiscan  Reading MD: N/A  Order Authorizing Provider:  Cassell Clementhomas Brackbill, MD  Resting Radionuclide: Technetium 8636m Sestamibi  Resting Radionuclide Dose: 33.0 mCi on 06/30/14   Stress Radionuclide:  Technetium 1936m Sestamibi  Stress Radionuclide Dose: 33.0 mCi on 06/29/14           Stress Protocol Rest HR: 90 Stress HR: 95  Rest BP: 138/87 Stress BP: 108/63  Exercise Time (min): n/a METS: n/a   Predicted Max HR: 150 bpm % Max HR: 63.33 bpm Rate Pressure Product: [id number]   Dose of Adenosine (mg):  n/a Dose of Lexiscan: 0.4 mg  Dose of Atropine (mg): n/a Dose of Dobutamine: n/a mcg/kg/min (at max HR)  Stress Test Technologist: Nelson ChimesSharon Brooks, BS-ES  Nuclear Technologist:  Kerby NoraElzbieta Kubak, CNMT     Rest Procedure:  Myocardial perfusion imaging was performed at rest 45 minutes following the intravenous administration of Technetium 8536m Sestamibi. Rest ECG: NSR-RBBB  Stress Procedure:  The patient received IV Lexiscan 0.4 mg over 15-seconds.  Technetium 4636m Sestamibi injected at  30-seconds.  Quantitative spect images were obtained after a 45 minute delay.  During the infusion of Lexiscan the patient complained of a headache that lingered.  Stress ECG: No significant change from baseline ECG  QPS Raw Data Images:  Normal; no motion artifact; normal heart/lung ratio. Stress Images:  There is a reversible defect involving the mid anterior, apical wall distribution moderate sized, moderate to severe in severity. At rest, there is minimal decreased uptake in the mid anterior distribution. Otherwise, homogeneous radiotracer uptake. Rest Images:  As above Subtraction (SDS):  3, likely underestimated Transient Ischemic Dilatation (Normal <1.22):  1.12 Lung/Heart Ratio (Normal <0.45):  0.48  Quantitative Gated Spect Images QGS EDV:  86 ml QGS ESV:  38 ml  Impression Exercise Capacity:  Lexiscan with no exercise. BP Response:  Normal blood pressure response. Clinical Symptoms:  Typical symptoms with Lexiscan ECG Impression:  No significant ST segment change suggestive of ischemia. Comparison with Prior Nuclear Study: No images to compare  Overall Impression:  Intermediate risk stress nuclear study demonstrating a mostly reversible defect along the mid to distal anterior wall/apical wall distribution. 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  LV Ejection Fraction: 56%.  LV Wall Motion:  NL LV Function; NL Wall Motion  [name], [name], MD

## 2014-06-30 ENCOUNTER — Ambulatory Visit (HOSPITAL_COMMUNITY): Payer: Medicare Other | Attending: Cardiology

## 2014-06-30 DIAGNOSIS — R0989 Other specified symptoms and signs involving the circulatory and respiratory systems: Secondary | ICD-10-CM

## 2014-06-30 MED ORDER — TECHNETIUM TC 99M SESTAMIBI GENERIC - CARDIOLITE
33.0000 | Freq: Once | INTRAVENOUS | Status: AC | PRN
  Administered 2014-06-30: 33 via INTRAVENOUS

## 2014-07-07 ENCOUNTER — Ambulatory Visit (INDEPENDENT_AMBULATORY_CARE_PROVIDER_SITE_OTHER): Payer: Medicare Other | Admitting: Cardiology

## 2014-07-07 ENCOUNTER — Telehealth: Payer: Self-pay | Admitting: *Deleted

## 2014-07-07 ENCOUNTER — Encounter: Payer: Self-pay | Admitting: Cardiology

## 2014-07-07 VITALS — BP 140/92 | HR 96 | Ht 76.0 in | Wt 292.0 lb

## 2014-07-07 DIAGNOSIS — R9439 Abnormal result of other cardiovascular function study: Secondary | ICD-10-CM

## 2014-07-07 DIAGNOSIS — I119 Hypertensive heart disease without heart failure: Secondary | ICD-10-CM

## 2014-07-07 DIAGNOSIS — R931 Abnormal findings on diagnostic imaging of heart and coronary circulation: Secondary | ICD-10-CM

## 2014-07-07 DIAGNOSIS — F32A Depression, unspecified: Secondary | ICD-10-CM

## 2014-07-07 DIAGNOSIS — F329 Major depressive disorder, single episode, unspecified: Secondary | ICD-10-CM

## 2014-07-07 DIAGNOSIS — R079 Chest pain, unspecified: Secondary | ICD-10-CM

## 2014-07-07 LAB — CBC WITH DIFFERENTIAL/PLATELET
BASOS ABS: 0 10*3/uL (ref 0.0–0.1)
Basophils Relative: 0.5 % (ref 0.0–3.0)
EOS PCT: 2.9 % (ref 0.0–5.0)
Eosinophils Absolute: 0.2 10*3/uL (ref 0.0–0.7)
HEMATOCRIT: 45 % (ref 39.0–52.0)
HEMOGLOBIN: 15 g/dL (ref 13.0–17.0)
LYMPHS ABS: 2.1 10*3/uL (ref 0.7–4.0)
Lymphocytes Relative: 32.6 % (ref 12.0–46.0)
MCHC: 33.3 g/dL (ref 30.0–36.0)
MCV: 91 fl (ref 78.0–100.0)
MONO ABS: 0.7 10*3/uL (ref 0.1–1.0)
MONOS PCT: 10.7 % (ref 3.0–12.0)
NEUTROS PCT: 53.3 % (ref 43.0–77.0)
Neutro Abs: 3.4 10*3/uL (ref 1.4–7.7)
Platelets: 126 10*3/uL — ABNORMAL LOW (ref 150.0–400.0)
RBC: 4.95 Mil/uL (ref 4.22–5.81)
RDW: 13.8 % (ref 11.5–15.5)
WBC: 6.4 10*3/uL (ref 4.0–10.5)

## 2014-07-07 LAB — BASIC METABOLIC PANEL
BUN: 21 mg/dL (ref 6–23)
CALCIUM: 9.6 mg/dL (ref 8.4–10.5)
CHLORIDE: 99 meq/L (ref 96–112)
CO2: 31 meq/L (ref 19–32)
Creatinine, Ser: 1.53 mg/dL — ABNORMAL HIGH (ref 0.40–1.50)
GFR: 47.97 mL/min — AB (ref >60.00)
GLUCOSE: 169 mg/dL — AB (ref 70–99)
Potassium: 4.7 mEq/L (ref 3.5–5.1)
SODIUM: 134 meq/L — AB (ref 135–145)

## 2014-07-07 LAB — APTT: aPTT: 29.8 s (ref 23.4–32.7)

## 2014-07-07 LAB — PROTIME-INR
INR: 0.9 ratio (ref 0.8–1.0)
Prothrombin Time: 10.2 s (ref 9.6–13.1)

## 2014-07-07 NOTE — Telephone Encounter (Signed)
Advised wife and they will arrive at 8:00 for cath

## 2014-07-07 NOTE — Patient Instructions (Addendum)
.  Will obtain labs today and call you with the results (BMET/CBC/PT/INR/PTT)  You are scheduled for a cardiac catheterization on 07/11/14  with Dr. SwazilandJordan or associate.  [facility] on 07/11/14 at 8:30 [facility] A No food or drink after midnight on 07/10/14 You may take your medications except the following with a sip of water on the day of your procedure.  NO LISINOPRIL MORNING OF  NO INSULIN MORNING OF  ONLY 20 UNITS OF LANTUS NIGHT BEFORE PROCEDURE  Your physician recommends that you schedule a follow-up appointment in: 07/27/14 at 2:15

## 2014-07-07 NOTE — Progress Notes (Signed)
Tyler Le Date of Birth:  1945-11-27 [address] [place], [place]  [address] [phone]         Fax   [phone]  History of Present Illness: This pleasant 69 year old gentleman is seen for a precardiac catheterization office visit. He has a history of known ischemic heart disease. He underwent coronary artery bypass graft surgery in 2001. In March 2012 he underwent bare-metal stent placement to the saphenous vein graft to the intermediate and obtuse marginal. He has been on aspirin 81.  Since last visit he has been experiencing more exertional chest tightness.. He has not been getting any regular exercise.  His weight remains elevated at 287.  Since last visit his depression seems to be better. He is being treated for his diabetes at the [facility]. He did have back surgery sometime last year.  The patient had a Lexiscan Myoview stress test on 06/29/14 which showed Intermediate risk stress nuclear study demonstrating a mostly reversible defect along the mid to distal anterior wall/apical wall distribution. 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  LV Ejection Fraction: 56%. LV Wall Motion: NL LV Function; NL Wall Motion He comes in now for an office visit to discuss the cath.  He states that he has continued to have more dyspnea and has had frequent episodes of chest tightness radiating into the left arm.  He did have one episode last night and took a nitroglycerin which helped.  Current Outpatient Prescriptions  Medication Sig Dispense Refill  . amLODipine (NORVASC) 10 MG tablet Take 1 tablet (10 mg total) by mouth daily. 30 tablet 3  . aspirin 81 MG tablet Take 1 tablet (81 mg total) by mouth daily.    . Blood Glucose Monitoring Suppl (FREESTYLE FREEDOM) KIT     . busPIRone (BUSPAR) 15 MG tablet Take 15 mg by mouth daily.     . carvedilol (COREG) 25 MG tablet Take 25 mg by mouth daily.     . Cholecalciferol (VITAMIN D3)  2000 UNITS TABS Take 2 tablets by mouth daily.      . CYMBALTA 60 MG capsule Take 60 mg by mouth daily.     Marland Kitchen HYDROcodone-acetaminophen (NORCO) 10-325 MG per tablet Take 1 tablet by mouth every 4 (four) hours as needed. For pain     . insulin aspart protamine-insulin aspart (NOVOLOG 70/30) (70-30) 100 UNIT/ML injection Inject 40-50 Units into the skin 2 (two) times daily with a meal. Take 50 units in the morning and 40 units in the evening    . insulin glargine (LANTUS) 100 UNIT/ML injection Inject 64 Units into the skin at bedtime.     . isosorbide mononitrate (IMDUR) 30 MG 24 hr tablet Take 30 mg by mouth at bedtime. Take whole tablet daily at night     . lisinopril (PRINIVIL,ZESTRIL) 5 MG tablet Take 2.5 mg by mouth daily.      . mirtazapine (REMERON) 15 MG tablet Take 15 mg by mouth at bedtime.     . Multiple Vitamin (MULTIVITAMIN) tablet Take 1 tablet by mouth daily.     . nitroGLYCERIN (NITROSTAT) 0.4 MG SL tablet Place 1 tablet (0.4 mg total) under the tongue every 5 (five) minutes as needed for chest pain (up to 3 doses). 100 tablet 4  . NOVOLOG FLEXPEN 100 UNIT/ML FlexPen     . omeprazole (PRILOSEC) 20 MG capsule Take 20 mg by mouth 2 (two) times daily before a meal.      .  Pramlintide Acetate (SYMLINPEN 120) 2700 MCG/2.7ML SOLN Inject 60 Units into the skin 2 (two) times daily before a meal.      . QUEtiapine (SEROQUEL) 400 MG tablet 300 mg as directed.     . simvastatin (ZOCOR) 40 MG tablet Take 20 mg by mouth at bedtime.       No current facility-administered medications for this visit.    Allergies  Allergen Reactions  . Metformin And Related Nausea And Vomiting  . Pravachol Nausea And Vomiting  . Ramipril Nausea Only  . Wellbutrin [Bupropion Hcl] Nausea And Vomiting    Patient Active Problem List   Diagnosis Date Noted  . S/P CABG (coronary artery bypass graft) 10/03/2010    Priority: High  . Angina pectoris 10/03/2010    Priority: Medium  . Type II or unspecified type  diabetes mellitus without mention of complication, uncontrolled 10/03/2010    Priority: Medium  . Low back pain 10/03/2010    Priority: Medium  . Anxiety 10/03/2010    Priority: Medium  . Depression 05/08/2011  . CAD (coronary artery disease) 01/15/2011  . Dyslipidemia 10/03/2010  . Benign hypertensive heart disease without heart failure 10/03/2010    History  Smoking status  . Former Smoker -- 4.00 packs/day for 25 years  . Types: Cigarettes  Smokeless tobacco  . Former Systems developer  . Types: Chew    Comment: "quit smoking ~ 1985"    History  Alcohol Use No    Comment: "might take a drink 3 X/yr"    Family History  Problem Relation Age of Onset  . Heart disease Mother   . Heart disease Father   . Heart attack Father   . Heart disease Brother     Review of Systems: Constitutional: no fever chills diaphoresis or fatigue or change in weight.  Head and neck: no hearing loss, no epistaxis, no photophobia or visual disturbance. Respiratory: No cough, shortness of breath or wheezing. Cardiovascular: No chest pain peripheral edema, palpitations. Gastrointestinal: No abdominal distention, no abdominal pain, no change in bowel habits hematochezia or melena. Genitourinary: No dysuria, no frequency, no urgency, no nocturia. Musculoskeletal:No arthralgias, no back pain, no gait disturbance or myalgias. Neurological: No dizziness, no headaches, no numbness, no seizures, no syncope, no weakness, no tremors. Hematologic: No lymphadenopathy, no easy bruising. Psychiatric: No confusion, no hallucinations, no sleep disturbance.    Physical Exam: Filed Vitals:   07/07/14 1505  BP: 140/92  Pulse: 96   the general appearance is that of a large elderly gentleman in no distress.The head and neck exam reveals pupils equal and reactive.  Extraocular movements are full.  There is no scleral icterus.  The mouth and pharynx are normal.  The neck is supple.  The carotids reveal no bruits.  The  jugular venous pressure is normal.  The  thyroid is not enlarged.  There is no lymphadenopathy.  The chest is clear to percussion and auscultation.  There are no rales or rhonchi.  Expansion of the chest is symmetrical.  The precordium is quiet.  The first heart sound is normal.  The second heart sound is physiologically split.  There is no murmur gallop rub or click.  There is no abnormal lift or heave.  The abdomen is soft and nontender.  The bowel sounds are normal.  The liver and spleen are not enlarged.  There are no abdominal masses.  There are no abdominal bruits.  Extremities reveal good pedal pulses.  There is no phlebitis or edema.  There is no cyanosis or clubbing.  Strength is normal and symmetrical in all extremities.  There is no lateralizing weakness.  There are no sensory deficits.  The skin is warm and dry.  There is no rash.     Assessment / Plan: 1.  Ischemic heart disease with recent increase in exertional chest tightness.  Recent abnormal Lexiscan Myoview stress test showing reversible anterior wall ischemia. 2.  Status post CABG 3.  Right bundle branch block 4.  Depression 5.  Dyslipidemia 6.  Diabetes mellitus type 2 7.  Chronic low back pain 8.  Mild renal insufficiency with precath creatinine 1.56 today.  Disposition: Cath scheduled for Monday I/18/16 with Dr. Martinique. We will have him stop his lisinopril 2.5 mg daily now. He will increase his water intake. We will repeat his BMET stat before cath. No LV gram.

## 2014-07-07 NOTE — Telephone Encounter (Signed)
-----   Message from Cassell Clementhomas Brackbill, MD sent at 07/07/2014  6:38 PM EST ----- Please report.  The labs are generally satisfactory except for his renal function which is not as good.  I want him to stop his lisinopril now and I want him to drink plenty of water between now and the time of the cath.  We should ask the hospital to check another BMET stat when he arrives for his cath.

## 2014-07-11 ENCOUNTER — Ambulatory Visit (HOSPITAL_COMMUNITY)
Admission: RE | Admit: 2014-07-11 | Discharge: 2014-07-12 | Disposition: A | Discharge status: Home / Self Care | Payer: Medicare Other | Source: Ambulatory Visit | Attending: Cardiology | Admitting: Cardiology

## 2014-07-11 ENCOUNTER — Encounter (HOSPITAL_COMMUNITY)
Admission: RE | Disposition: A | Discharge status: Home / Self Care | Payer: Medicare Other | Source: Ambulatory Visit | Attending: Cardiology

## 2014-07-11 ENCOUNTER — Encounter (HOSPITAL_COMMUNITY): Payer: Self-pay | Admitting: Nurse Practitioner

## 2014-07-11 DIAGNOSIS — E119 Type 2 diabetes mellitus without complications: Secondary | ICD-10-CM | POA: Insufficient documentation

## 2014-07-11 DIAGNOSIS — I2510 Atherosclerotic heart disease of native coronary artery without angina pectoris: Secondary | ICD-10-CM | POA: Diagnosis present

## 2014-07-11 DIAGNOSIS — E785 Hyperlipidemia, unspecified: Secondary | ICD-10-CM | POA: Diagnosis not present

## 2014-07-11 DIAGNOSIS — Y832 Surgical operation with anastomosis, bypass or graft as the cause of abnormal reaction of the patient, or of later complication, without mention of misadventure at the time of the procedure: Secondary | ICD-10-CM | POA: Diagnosis not present

## 2014-07-11 DIAGNOSIS — I4510 Unspecified right bundle-branch block: Secondary | ICD-10-CM | POA: Diagnosis not present

## 2014-07-11 DIAGNOSIS — R079 Chest pain, unspecified: Secondary | ICD-10-CM

## 2014-07-11 DIAGNOSIS — I25710 Atherosclerosis of autologous vein coronary artery bypass graft(s) with unstable angina pectoris: Secondary | ICD-10-CM | POA: Diagnosis not present

## 2014-07-11 DIAGNOSIS — F329 Major depressive disorder, single episode, unspecified: Secondary | ICD-10-CM | POA: Diagnosis not present

## 2014-07-11 DIAGNOSIS — E669 Obesity, unspecified: Secondary | ICD-10-CM | POA: Diagnosis not present

## 2014-07-11 DIAGNOSIS — Z87891 Personal history of nicotine dependence: Secondary | ICD-10-CM | POA: Diagnosis not present

## 2014-07-11 DIAGNOSIS — D696 Thrombocytopenia, unspecified: Secondary | ICD-10-CM | POA: Insufficient documentation

## 2014-07-11 DIAGNOSIS — Z794 Long term (current) use of insulin: Secondary | ICD-10-CM | POA: Insufficient documentation

## 2014-07-11 DIAGNOSIS — F419 Anxiety disorder, unspecified: Secondary | ICD-10-CM | POA: Diagnosis not present

## 2014-07-11 DIAGNOSIS — I2582 Chronic total occlusion of coronary artery: Secondary | ICD-10-CM | POA: Diagnosis not present

## 2014-07-11 DIAGNOSIS — Y9289 Other specified places as the place of occurrence of the external cause: Secondary | ICD-10-CM | POA: Insufficient documentation

## 2014-07-11 DIAGNOSIS — Z6837 Body mass index (BMI) 37.0-37.9, adult: Secondary | ICD-10-CM | POA: Diagnosis not present

## 2014-07-11 DIAGNOSIS — F32A Depression, unspecified: Secondary | ICD-10-CM

## 2014-07-11 DIAGNOSIS — Z7982 Long term (current) use of aspirin: Secondary | ICD-10-CM | POA: Insufficient documentation

## 2014-07-11 DIAGNOSIS — I200 Unstable angina: Secondary | ICD-10-CM | POA: Diagnosis present

## 2014-07-11 DIAGNOSIS — Z951 Presence of aortocoronary bypass graft: Secondary | ICD-10-CM

## 2014-07-11 DIAGNOSIS — I119 Hypertensive heart disease without heart failure: Secondary | ICD-10-CM

## 2014-07-11 DIAGNOSIS — R9439 Abnormal result of other cardiovascular function study: Secondary | ICD-10-CM | POA: Diagnosis present

## 2014-07-11 DIAGNOSIS — T82857A Stenosis of cardiac prosthetic devices, implants and grafts, initial encounter: Secondary | ICD-10-CM | POA: Diagnosis not present

## 2014-07-11 DIAGNOSIS — Z87442 Personal history of urinary calculi: Secondary | ICD-10-CM | POA: Diagnosis not present

## 2014-07-11 DIAGNOSIS — K219 Gastro-esophageal reflux disease without esophagitis: Secondary | ICD-10-CM | POA: Diagnosis not present

## 2014-07-11 DIAGNOSIS — N289 Disorder of kidney and ureter, unspecified: Secondary | ICD-10-CM | POA: Insufficient documentation

## 2014-07-11 DIAGNOSIS — I10 Essential (primary) hypertension: Secondary | ICD-10-CM | POA: Diagnosis present

## 2014-07-11 DIAGNOSIS — I25119 Atherosclerotic heart disease of native coronary artery with unspecified angina pectoris: Secondary | ICD-10-CM

## 2014-07-11 HISTORY — DX: Low back pain: M54.5

## 2014-07-11 HISTORY — DX: Morbid (severe) obesity due to excess calories: E66.01

## 2014-07-11 HISTORY — PX: LEFT HEART CATHETERIZATION WITH CORONARY ANGIOGRAM: SHX5451

## 2014-07-11 HISTORY — DX: Disorder of kidney and ureter, unspecified: N28.9

## 2014-07-11 HISTORY — PX: CORONARY ANGIOPLASTY WITH STENT PLACEMENT: SHX49

## 2014-07-11 HISTORY — DX: Essential (primary) hypertension: I10

## 2014-07-11 HISTORY — DX: Unspecified osteoarthritis, unspecified site: M19.90

## 2014-07-11 HISTORY — DX: Personal history of other medical treatment: Z92.89

## 2014-07-11 HISTORY — DX: Type 2 diabetes mellitus without complications: E11.9

## 2014-07-11 HISTORY — DX: Calculus of kidney: N20.0

## 2014-07-11 HISTORY — DX: Unspecified right bundle-branch block: I45.10

## 2014-07-11 HISTORY — DX: Thrombocytopenia, unspecified: D69.6

## 2014-07-11 HISTORY — DX: Other chronic pain: G89.29

## 2014-07-11 HISTORY — DX: Low back pain, unspecified: M54.50

## 2014-07-11 LAB — BASIC METABOLIC PANEL
Anion gap: 4 — ABNORMAL LOW (ref 5–15)
BUN: 15 mg/dL (ref 6–23)
CO2: 30 mmol/L (ref 19–32)
Calcium: 9 mg/dL (ref 8.4–10.5)
Chloride: 99 mEq/L (ref 96–112)
Creatinine, Ser: 1.32 mg/dL (ref 0.50–1.35)
GFR calc Af Amer: 61 mL/min — ABNORMAL LOW (ref >90)
GFR calc non Af Amer: 53 mL/min — ABNORMAL LOW (ref >90)
Glucose, Bld: 260 mg/dL — ABNORMAL HIGH (ref 70–99)
Potassium: 4.1 mmol/L (ref 3.5–5.1)
Sodium: 133 mmol/L — ABNORMAL LOW (ref 135–145)

## 2014-07-11 LAB — GLUCOSE, CAPILLARY
GLUCOSE-CAPILLARY: 236 mg/dL — AB (ref 70–99)
Glucose-Capillary: 235 mg/dL — ABNORMAL HIGH (ref 70–99)
Glucose-Capillary: 182 mg/dL — ABNORMAL HIGH (ref 70–99)

## 2014-07-11 LAB — POCT ACTIVATED CLOTTING TIME: ACTIVATED CLOTTING TIME: 657 s

## 2014-07-11 SURGERY — LEFT HEART CATHETERIZATION WITH CORONARY ANGIOGRAM
Anesthesia: LOCAL

## 2014-07-11 MED ORDER — PANTOPRAZOLE SODIUM 40 MG PO TBEC
40.0000 mg | DELAYED_RELEASE_TABLET | Freq: Every day | ORAL | Status: DC
  Administered 2014-07-11 – 2014-07-12 (×2): 40 mg via ORAL
  Filled 2014-07-11 (×2): qty 1

## 2014-07-11 MED ORDER — INSULIN ASPART PROT & ASPART (70-30 MIX) 100 UNIT/ML ~~LOC~~ SUSP
50.0000 [IU] | Freq: Every day | SUBCUTANEOUS | Status: DC
  Administered 2014-07-12: 08:00:00 50 [IU] via SUBCUTANEOUS

## 2014-07-11 MED ORDER — SODIUM CHLORIDE 0.9 % IV SOLN
0.2500 mg/kg/h | INTRAVENOUS | Status: DC
Start: 2014-07-11 — End: 2014-07-11
  Filled 2014-07-11: qty 250

## 2014-07-11 MED ORDER — ASPIRIN 81 MG PO CHEW
81.0000 mg | CHEWABLE_TABLET | ORAL | Status: AC
  Administered 2014-07-11: 81 mg via ORAL

## 2014-07-11 MED ORDER — HYDRALAZINE HCL 20 MG/ML IJ SOLN: 10.0000 mg | INTRAMUSCULAR | Status: DC | PRN

## 2014-07-11 MED ORDER — BUSPIRONE HCL 15 MG PO TABS
15.0000 mg | ORAL_TABLET | Freq: Every day | ORAL | Status: DC
  Administered 2014-07-11: 10 mg via ORAL
  Administered 2014-07-11: 20:00:00 5 mg via ORAL
  Administered 2014-07-12: 10:00:00 15 mg via ORAL
  Filled 2014-07-11 (×2): qty 1

## 2014-07-11 MED ORDER — ASPIRIN 81 MG PO CHEW
CHEWABLE_TABLET | ORAL | Status: AC
  Filled 2014-07-11: qty 1

## 2014-07-11 MED ORDER — VITAMIN D3 25 MCG (1000 UNIT) PO TABS
2000.0000 [IU] | ORAL_TABLET | Freq: Every day | ORAL | Status: DC
  Administered 2014-07-11: 1000 [IU] via ORAL
  Administered 2014-07-12: 2000 [IU] via ORAL
  Filled 2014-07-11 (×2): qty 2

## 2014-07-11 MED ORDER — ADULT MULTIVITAMIN W/MINERALS CH
1.0000 | ORAL_TABLET | Freq: Every day | ORAL | Status: DC
  Administered 2014-07-11 – 2014-07-12 (×2): 1 via ORAL
  Filled 2014-07-11 (×2): qty 1

## 2014-07-11 MED ORDER — TICAGRELOR 90 MG PO TABS
90.0000 mg | ORAL_TABLET | Freq: Two times a day (BID) | ORAL | Status: DC
  Administered 2014-07-11 – 2014-07-12 (×2): 90 mg via ORAL
  Filled 2014-07-11 (×3): qty 1

## 2014-07-11 MED ORDER — LABETALOL HCL 5 MG/ML IV SOLN
INTRAVENOUS | Status: AC
  Filled 2014-07-11: qty 4

## 2014-07-11 MED ORDER — HEPARIN (PORCINE) IN NACL 2-0.9 UNIT/ML-% IJ SOLN
INTRAMUSCULAR | Status: AC
  Filled 2014-07-11: qty 1000

## 2014-07-11 MED ORDER — HYDRALAZINE HCL 20 MG/ML IJ SOLN
10.0000 mg | Freq: Four times a day (QID) | INTRAMUSCULAR | Status: DC | PRN
  Filled 2014-07-11: qty 1

## 2014-07-11 MED ORDER — INSULIN ASPART PROT & ASPART (70-30 MIX) 100 UNIT/ML ~~LOC~~ SUSP
40.0000 [IU] | Freq: Two times a day (BID) | SUBCUTANEOUS | Status: DC
Start: 2014-07-11 — End: 2014-07-11

## 2014-07-11 MED ORDER — DULOXETINE HCL 60 MG PO CPEP
60.0000 mg | ORAL_CAPSULE | Freq: Every day | ORAL | Status: DC
  Administered 2014-07-11 – 2014-07-12 (×2): 60 mg via ORAL
  Filled 2014-07-11 (×2): qty 1

## 2014-07-11 MED ORDER — HYDROCODONE-ACETAMINOPHEN 10-325 MG PO TABS: 1.0000 | ORAL_TABLET | ORAL | Status: DC | PRN

## 2014-07-11 MED ORDER — TICAGRELOR 90 MG PO TABS
ORAL_TABLET | ORAL | Status: AC
  Filled 2014-07-11: qty 1

## 2014-07-11 MED ORDER — PRAMLINTIDE ACETATE 2700 MCG/2.7ML ~~LOC~~ SOPN: 60.0000 [IU] | PEN_INJECTOR | Freq: Two times a day (BID) | SUBCUTANEOUS | Status: DC

## 2014-07-11 MED ORDER — ONDANSETRON HCL 4 MG/2ML IJ SOLN: 4.0000 mg | Freq: Four times a day (QID) | INTRAMUSCULAR | Status: DC | PRN

## 2014-07-11 MED ORDER — FENTANYL CITRATE 0.05 MG/ML IJ SOLN
INTRAMUSCULAR | Status: AC
  Filled 2014-07-11: qty 2

## 2014-07-11 MED ORDER — SODIUM CHLORIDE 0.9 % IV SOLN
1.0000 mL/kg/h | INTRAVENOUS | Status: AC
  Administered 2014-07-11: 13:00:00 1 mL/kg/h via INTRAVENOUS

## 2014-07-11 MED ORDER — INSULIN ASPART PROT & ASPART (70-30 MIX) 100 UNIT/ML ~~LOC~~ SUSP
40.0000 [IU] | Freq: Every day | SUBCUTANEOUS | Status: DC
  Administered 2014-07-11: 40 [IU] via SUBCUTANEOUS
  Filled 2014-07-11: qty 10

## 2014-07-11 MED ORDER — TICAGRELOR 90 MG PO TABS
ORAL_TABLET | ORAL | Status: AC
  Administered 2014-07-11: 90 mg via ORAL
  Filled 2014-07-11: qty 1

## 2014-07-11 MED ORDER — VITAMIN D3 50 MCG (2000 UT) PO TABS
2.0000 | ORAL_TABLET | Freq: Every day | ORAL | Status: DC
Start: 2014-07-11 — End: 2014-07-11

## 2014-07-11 MED ORDER — ALPRAZOLAM 0.5 MG PO TABS
0.5000 mg | ORAL_TABLET | Freq: Three times a day (TID) | ORAL | Status: DC | PRN
  Administered 2014-07-11: 0.5 mg via ORAL
  Filled 2014-07-11: qty 1

## 2014-07-11 MED ORDER — INSULIN GLARGINE 100 UNIT/ML ~~LOC~~ SOLN
64.0000 [IU] | Freq: Every day | SUBCUTANEOUS | Status: DC
  Administered 2014-07-11: 21:00:00 64 [IU] via SUBCUTANEOUS
  Filled 2014-07-11 (×3): qty 0.64

## 2014-07-11 MED ORDER — MIRTAZAPINE 15 MG PO TABS
15.0000 mg | ORAL_TABLET | Freq: Every day | ORAL | Status: DC
  Administered 2014-07-11: 21:00:00 15 mg via ORAL
  Filled 2014-07-11 (×2): qty 1

## 2014-07-11 MED ORDER — SIMVASTATIN 20 MG PO TABS
20.0000 mg | ORAL_TABLET | Freq: Every day | ORAL | Status: DC
  Administered 2014-07-11: 20 mg via ORAL
  Filled 2014-07-11 (×2): qty 1

## 2014-07-11 MED ORDER — NITROGLYCERIN 1 MG/10 ML FOR IR/CATH LAB
INTRA_ARTERIAL | Status: AC
  Filled 2014-07-11: qty 10

## 2014-07-11 MED ORDER — ACETAMINOPHEN 325 MG PO TABS: 650.0000 mg | ORAL_TABLET | ORAL | Status: DC | PRN

## 2014-07-11 MED ORDER — CARVEDILOL 25 MG PO TABS
25.0000 mg | ORAL_TABLET | Freq: Every day | ORAL | Status: DC
  Administered 2014-07-11 – 2014-07-12 (×2): 25 mg via ORAL
  Filled 2014-07-11 (×2): qty 1

## 2014-07-11 MED ORDER — AMLODIPINE BESYLATE 10 MG PO TABS
10.0000 mg | ORAL_TABLET | Freq: Every day | ORAL | Status: DC
  Administered 2014-07-11 – 2014-07-12 (×2): 10 mg via ORAL
  Filled 2014-07-11 (×2): qty 1

## 2014-07-11 MED ORDER — NITROGLYCERIN 0.4 MG SL SUBL: 0.4000 mg | SUBLINGUAL_TABLET | SUBLINGUAL | Status: DC | PRN

## 2014-07-11 MED ORDER — QUETIAPINE FUMARATE 300 MG PO TABS
300.0000 mg | ORAL_TABLET | Freq: Every day | ORAL | Status: DC
  Administered 2014-07-11: 21:00:00 300 mg via ORAL
  Filled 2014-07-11 (×2): qty 1

## 2014-07-11 MED ORDER — SODIUM CHLORIDE 0.9 % IV SOLN
250.0000 mL | INTRAVENOUS | Status: DC | PRN
Start: 2014-07-11 — End: 2014-07-11

## 2014-07-11 MED ORDER — ONE-DAILY MULTI VITAMINS PO TABS: 1.0000 | ORAL_TABLET | Freq: Every day | ORAL | Status: DC

## 2014-07-11 MED ORDER — BIVALIRUDIN 250 MG IV SOLR
INTRAVENOUS | Status: AC
  Filled 2014-07-11: qty 250

## 2014-07-11 MED ORDER — HYDRALAZINE HCL 20 MG/ML IJ SOLN
INTRAMUSCULAR | Status: AC
  Filled 2014-07-11: qty 1

## 2014-07-11 MED ORDER — ASPIRIN 81 MG PO CHEW
81.0000 mg | CHEWABLE_TABLET | Freq: Every day | ORAL | Status: DC
  Administered 2014-07-11 – 2014-07-12 (×2): 81 mg via ORAL
  Filled 2014-07-11 (×2): qty 1

## 2014-07-11 MED ORDER — SODIUM CHLORIDE 0.9 % IV SOLN
INTRAVENOUS | Status: DC
  Administered 2014-07-11: 09:00:00 via INTRAVENOUS

## 2014-07-11 MED ORDER — MIDAZOLAM HCL 2 MG/2ML IJ SOLN
INTRAMUSCULAR | Status: AC
  Filled 2014-07-11: qty 2

## 2014-07-11 MED ORDER — SODIUM CHLORIDE 0.9 % IJ SOLN: 3.0000 mL | Freq: Two times a day (BID) | INTRAMUSCULAR | Status: DC

## 2014-07-11 MED ORDER — LIDOCAINE HCL (PF) 1 % IJ SOLN
INTRAMUSCULAR | Status: AC
  Filled 2014-07-11: qty 30

## 2014-07-11 MED ORDER — SODIUM CHLORIDE 0.9 % IJ SOLN: 3.0000 mL | INTRAMUSCULAR | Status: DC | PRN

## 2014-07-11 MED ORDER — ISOSORBIDE MONONITRATE ER 30 MG PO TB24
30.0000 mg | ORAL_TABLET | Freq: Every day | ORAL | Status: DC
  Administered 2014-07-11: 30 mg via ORAL
  Filled 2014-07-11 (×2): qty 1

## 2014-07-11 NOTE — CV Procedure (Signed)
Cardiac Catheterization Procedure Note  Name: Tyler Le MRN: [id number] DOB: [birth year]  Procedure: Left Heart Cath, Selective Coronary Angiography, AVG and LIMA angiography, LV angiography,  PTCA/Stent of ostial SVG to ramus/OM  Indication: 69 yo WM with history of CAD s/p remote CABG. S/p stenting of the SVG to ramus and OM in 2012 with BMS. He presents with increasing angina and has an intermediate risk stress test.    Diagnostic Procedure Details: The right groin was prepped, draped, and anesthetized with 1% lidocaine. Using the modified Seldinger technique, a 5 French sheath was introduced into the right femoral artery. Standard Judkins catheters were used for selective coronary angiography and left ventriculography. Catheter exchanges were performed over a wire.  The diagnostic procedure was well-tolerated without immediate complications.  PROCEDURAL FINDINGS Hemodynamics: AO 144/75 mean 95 mm Hg LV 148/16 mm Hg  Coronary angiography: Coronary dominance: right  Left mainstem: Normal.  Left anterior descending (LAD): The LAD is severely calcified throughout the proximal to mid vessel. The proximal vessel has diffuse severe disease and then is occluded in the mid vessel. The first diagonal is small and diffusely diseased. The second diagonal is also small and diffusely diseased. There is retrograde filling of the SVG to diagonal.  Left circumflex (LCx): The LCx is diffusely diseased. There is a 70% stenosis in the mid vessel. The ramus intermediate and first OM are occluded. The second OM is small with a 80% stenosis at the origin.   Right coronary artery (RCA): 100% occlusion at the origin.  SVG to RCA is occluded.  SVG to the diagonal is occluded.  SVG to the ramus and OM 1 is patent. There is 70-80% stenosis in the stent with dampening of pressure with catheter engagement.   LIMA to the LAD is widely patent.  Left ventriculography: Not done  PCI Procedure Note:   Following the diagnostic procedure, the decision was made to proceed with PCI of the SVG to the ramus/OM. The sheath was upsized to a 6 [place]. Weight-based bivalirudin was given for anticoagulation. Brilinta 180 mg was given orally. Once a therapeutic ACT was achieved, a 6 [place] FR4 guide with side holes catheter was inserted.  A filter coronary guidewire was used to cross the lesion and the filter was deployed in the body of the SVG.  The lesion was predilated with a 2.5 mm balloon and then a 3.0 mm balloon.  The lesion was then stented with a 4.0 x 24 mm Promus stent.  The stent was postdilated with a 4.5 mm noncompliant balloon.  The filter was then retrieved without difficulty. Following PCI, there was 0% residual stenosis and TIMI-3 flow. Final angiography confirmed an excellent result. Femoral hemostasis was achieved with Manual compression in the recovery area. the patient had a high bifurcation and we were unable to use a closure device.  The patient tolerated the PCI procedure well. There were no immediate procedural complications.  The patient was transferred to the post catheterization recovery area for further monitoring.  PCI Data: Vessel - SVG to ramus/OM/Segment - ostial Percent Stenosis (pre)  70-80% TIMI-flow 3 Stent 4.0 x 24 mm Promus Percent Stenosis (post) 0% TIMI-flow (post) 3  Final Conclusions:   1. Severe 3 vessel obstructive CAD 2. Patent LIMA to the LAD. 3. Restenosis in the stent in SVG to ramus/OM 4. Occluded SVG to diagonal 5. Occluded SVG to RCA 6. Successful stenting of the SVG to ramus/OM ostium with a DES.   Recommendations:  Continue  DAPT indefinitely if possible. Needs to take at least one year. Anticipate DC in am.  Shandie Bertz [place], MDFACC  07/11/2014, 12:33 PM

## 2014-07-11 NOTE — Care Management Note (Signed)
    Page 1 of 2   07/12/2014     11:01:52 AM CARE MANAGEMENT NOTE 07/12/2014  Patient:  Tyler Le, Tyler Le   Account Number:  [id number]  Date Initiated:  07/11/2014  Documentation initiated by:  Tajanae Guilbault  Subjective/Objective Assessment:   abnormal nuclear stress test     Action/Plan:   CM to follow for disposition needs   Anticipated DC Date:  07/12/2014   Anticipated DC Plan:  [place]  CM consult  Medication Assistance      Choice offered to / List presented to:             Status of service:  Completed, signed off Medicare Important Message given?  NO (If response is "NO", the following Medicare IM given date fields will be blank) Date Medicare IM given:   Medicare IM given by:   Date Additional Medicare IM given:   Additional Medicare IM given by:    Discharge Disposition:  HOME/SELF CARE  Per UR Regulation:    If discussed at Long Length of Stay Meetings, dates discussed:    Comments:  Eulalie Speights RN, BSN, MSHL, CCM  Nurse - Case Manager,  (Unit [phone])  [phone]  07/12/2014 CM provided Brilinta  beneftis card and benefits information. CM provided medication patient assistance application in the event the co-pay is too high s/p meeting deductible. CM advised to contact prescribing MD for asssitance if needed for problematic medication needs.   Odesser Tourangeau RN, BSN, MSHL, CCM  Nurse - Case Manager,  (Unit [phone]  [phone]  07/11/2014 Benefits Check: ticagrelor (BRILINTA) tablet 90 mg  :  Dose 90 mg  :  Oral :  2 times daily Coverage, co-pay, authorizations, deductibles, pharmacy requirements. PER REP AT [facility] RX: BRILINTA: TIER 3 MEDICATION/ UNABLE TO PROVIDE CO-PAY AFTER DEDUCTIBLE MET NO AUTH NEEDED PATIENT IN DEDUCTIBLE PHASE: $100.22 STILL Needs to be met PATIENT CAN USE ANY RETAIL PHARMACY

## 2014-07-11 NOTE — H&P (View-Only) (Signed)
 Tyler Le Date of Birth:  06/10/1946 [address] [place], [place]  [address] [phone]         Fax   [phone]  History of Present Illness: This pleasant 69-year-old gentleman is seen for a precardiac catheterization office visit. He has a history of known ischemic heart disease. He underwent coronary artery bypass graft surgery in 2001. In March 2012 he underwent bare-metal stent placement to the saphenous vein graft to the intermediate and obtuse marginal. He has been on aspirin 81.  Since last visit he has been experiencing more exertional chest tightness.. He has not been getting any regular exercise.  His weight remains elevated at 287.  Since last visit his depression seems to be better. [facility] He did have back surgery sometime last year.  The patient had a Lexiscan Myoview stress test on 06/29/14 which showed Intermediate risk stress nuclear study demonstrating a mostly reversible defect along the mid to distal anterior wall/apical wall distribution. 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  LV Ejection Fraction: 56%. LV Wall Motion: NL LV Function; NL Wall Motion He comes in now for an office visit to discuss the cath.  He states that he has continued to have more dyspnea and has had frequent episodes of chest tightness radiating into the left arm.  He did have one episode last night and took a nitroglycerin which helped.  Current Outpatient Prescriptions  Medication Sig Dispense Refill  . amLODipine (NORVASC) 10 MG tablet Take 1 tablet (10 mg total) by mouth daily. 30 tablet 3  . aspirin 81 MG tablet Take 1 tablet (81 mg total) by mouth daily.    . Blood Glucose Monitoring Suppl (FREESTYLE FREEDOM) KIT     . busPIRone (BUSPAR) 15 MG tablet Take 15 mg by mouth daily.     . carvedilol (COREG) 25 MG tablet Take 25 mg by mouth daily.     . Cholecalciferol (VITAMIN D3)  2000 UNITS TABS Take 2 tablets by mouth daily.      . CYMBALTA 60 MG capsule Take 60 mg by mouth daily.     . HYDROcodone-acetaminophen (NORCO) 10-325 MG per tablet Take 1 tablet by mouth every 4 (four) hours as needed. For pain     . insulin aspart protamine-insulin aspart (NOVOLOG 70/30) (70-30) 100 UNIT/ML injection Inject 40-50 Units into the skin 2 (two) times daily with a meal. Take 50 units in the morning and 40 units in the evening    . insulin glargine (LANTUS) 100 UNIT/ML injection Inject 64 Units into the skin at bedtime.     . isosorbide mononitrate (IMDUR) 30 MG 24 hr tablet Take 30 mg by mouth at bedtime. Take whole tablet daily at night     . lisinopril (PRINIVIL,ZESTRIL) 5 MG tablet Take 2.5 mg by mouth daily.      . mirtazapine (REMERON) 15 MG tablet Take 15 mg by mouth at bedtime.     . Multiple Vitamin (MULTIVITAMIN) tablet Take 1 tablet by mouth daily.     . nitroGLYCERIN (NITROSTAT) 0.4 MG SL tablet Place 1 tablet (0.4 mg total) under the tongue every 5 (five) minutes as needed for chest pain (up to 3 doses). 100 tablet 4  . NOVOLOG FLEXPEN 100 UNIT/ML FlexPen     . omeprazole (PRILOSEC) 20 MG capsule Take 20 mg by mouth 2 (two) times daily before a meal.      .   Pramlintide Acetate (SYMLINPEN 120) 2700 MCG/2.7ML SOLN Inject 60 Units into the skin 2 (two) times daily before a meal.      . QUEtiapine (SEROQUEL) 400 MG tablet 300 mg as directed.     . simvastatin (ZOCOR) 40 MG tablet Take 20 mg by mouth at bedtime.       No current facility-administered medications for this visit.    Allergies  Allergen Reactions  . Metformin And Related Nausea And Vomiting  . Pravachol Nausea And Vomiting  . Ramipril Nausea Only  . Wellbutrin [Bupropion Hcl] Nausea And Vomiting    Patient Active Problem List   Diagnosis Date Noted  . S/P CABG (coronary artery bypass graft) 10/03/2010    Priority: High  . Angina pectoris 10/03/2010    Priority: Medium  . Type II or unspecified type  diabetes mellitus without mention of complication, uncontrolled 10/03/2010    Priority: Medium  . Low back pain 10/03/2010    Priority: Medium  . Anxiety 10/03/2010    Priority: Medium  . Depression 05/08/2011  . CAD (coronary artery disease) 01/15/2011  . Dyslipidemia 10/03/2010  . Benign hypertensive heart disease without heart failure 10/03/2010    History  Smoking status  . Former Smoker -- 4.00 packs/day for 25 years  . Types: Cigarettes  Smokeless tobacco  . Former User  . Types: Chew    Comment: "quit smoking ~ 1985"    History  Alcohol Use No    Comment: "might take a drink 3 X/yr"    Family History  Problem Relation Age of Onset  . Heart disease Mother   . Heart disease Father   . Heart attack Father   . Heart disease Brother     Review of Systems: Constitutional: no fever chills diaphoresis or fatigue or change in weight.  Head and neck: no hearing loss, no epistaxis, no photophobia or visual disturbance. Respiratory: No cough, shortness of breath or wheezing. Cardiovascular: No chest pain peripheral edema, palpitations. Gastrointestinal: No abdominal distention, no abdominal pain, no change in bowel habits hematochezia or melena. Genitourinary: No dysuria, no frequency, no urgency, no nocturia. Musculoskeletal:No arthralgias, no back pain, no gait disturbance or myalgias. Neurological: No dizziness, no headaches, no numbness, no seizures, no syncope, no weakness, no tremors. Hematologic: No lymphadenopathy, no easy bruising. Psychiatric: No confusion, no hallucinations, no sleep disturbance.    Physical Exam: Filed Vitals:   07/07/14 1505  BP: 140/92  Pulse: 96   the general appearance is that of a large elderly gentleman in no distress.The head and neck exam reveals pupils equal and reactive.  Extraocular movements are full.  There is no scleral icterus.  The mouth and pharynx are normal.  The neck is supple.  The carotids reveal no bruits.  The  jugular venous pressure is normal.  The  thyroid is not enlarged.  There is no lymphadenopathy.  The chest is clear to percussion and auscultation.  There are no rales or rhonchi.  Expansion of the chest is symmetrical.  The precordium is quiet.  The first heart sound is normal.  The second heart sound is physiologically split.  There is no murmur gallop rub or click.  There is no abnormal lift or heave.  The abdomen is soft and nontender.  The bowel sounds are normal.  The liver and spleen are not enlarged.  There are no abdominal masses.  There are no abdominal bruits.  Extremities reveal good pedal pulses.  There is no phlebitis or edema.    There is no cyanosis or clubbing.  Strength is normal and symmetrical in all extremities.  There is no lateralizing weakness.  There are no sensory deficits.  The skin is warm and dry.  There is no rash.     Assessment / Plan: 1.  Ischemic heart disease with recent increase in exertional chest tightness.  Recent abnormal Lexiscan Myoview stress test showing reversible anterior wall ischemia. 2.  Status post CABG 3.  Right bundle branch block 4.  Depression 5.  Dyslipidemia 6.  Diabetes mellitus type 2 7.  Chronic low back pain 8.  Mild renal insufficiency with precath creatinine 1.56 today.  Disposition: Cath scheduled for Monday I/18/16 with Dr. Jordan. We will have him stop his lisinopril 2.5 mg daily now. He will increase his water intake. We will repeat his BMET stat before cath. No LV gram.   

## 2014-07-11 NOTE — Interval H&P Note (Signed)
History and Physical Interval Note:  07/11/2014 10:57 AM  Tyler Le  has presented today for surgery, with the diagnosis of abnormal nuclear stress test  The various methods of treatment have been discussed with the patient and family. After consideration of risks, benefits and other options for treatment, the patient has consented to  Procedure(s): LEFT HEART CATHETERIZATION WITH CORONARY ANGIOGRAM (N/A) as a surgical intervention .  The patient's history has been reviewed, patient examined, no change in status, stable for surgery.  I have reviewed the patient's chart and labs.  Questions were answered to the patient's satisfaction.   Cath Lab Visit (complete for each Cath Lab visit)  Clinical Evaluation Leading to the Procedure:   ACS: No.  Non-ACS:    Anginal Classification: CCS III  Anti-ischemic medical therapy: Maximal Therapy (2 or more classes of medications)  Non-Invasive Test Results: Intermediate-risk stress test findings: cardiac mortality 1-3%/year  Prior CABG: Previous CABG       Tyler Aristaeter [facility] Medical CenterJordanMD,FACC 07/11/2014 10:57 AM

## 2014-07-11 NOTE — Progress Notes (Signed)
Site area: right groin  Site Prior to Removal:  Level 0  Pressure Applied For 20 MINUTES    Minutes Beginning at 1635  Manual:   Yes.    Patient Status During Pull:  stable  Post Pull Groin Site:  Level 0  Post Pull Instructions Given:  Yes.    Post Pull Pulses Present:  Yes.    Dressing Applied:  Yes.

## 2014-07-12 ENCOUNTER — Other Ambulatory Visit: Payer: Self-pay | Admitting: Physician Assistant

## 2014-07-12 ENCOUNTER — Encounter (HOSPITAL_COMMUNITY): Payer: Self-pay | Admitting: Physician Assistant

## 2014-07-12 DIAGNOSIS — I25710 Atherosclerosis of autologous vein coronary artery bypass graft(s) with unstable angina pectoris: Secondary | ICD-10-CM | POA: Diagnosis not present

## 2014-07-12 DIAGNOSIS — N289 Disorder of kidney and ureter, unspecified: Secondary | ICD-10-CM

## 2014-07-12 DIAGNOSIS — T82857A Stenosis of cardiac prosthetic devices, implants and grafts, initial encounter: Secondary | ICD-10-CM | POA: Diagnosis not present

## 2014-07-12 DIAGNOSIS — Z951 Presence of aortocoronary bypass graft: Secondary | ICD-10-CM

## 2014-07-12 DIAGNOSIS — I4510 Unspecified right bundle-branch block: Secondary | ICD-10-CM | POA: Diagnosis not present

## 2014-07-12 DIAGNOSIS — R931 Abnormal findings on diagnostic imaging of heart and coronary circulation: Secondary | ICD-10-CM

## 2014-07-12 DIAGNOSIS — I10 Essential (primary) hypertension: Secondary | ICD-10-CM

## 2014-07-12 DIAGNOSIS — I119 Hypertensive heart disease without heart failure: Secondary | ICD-10-CM | POA: Diagnosis not present

## 2014-07-12 DIAGNOSIS — I25709 Atherosclerosis of coronary artery bypass graft(s), unspecified, with unspecified angina pectoris: Secondary | ICD-10-CM

## 2014-07-12 LAB — CBC
HCT: 39.7 % (ref 39.0–52.0)
Hemoglobin: 13.8 g/dL (ref 13.0–17.0)
MCH: 30.7 pg (ref 26.0–34.0)
MCHC: 34.8 g/dL (ref 30.0–36.0)
MCV: 88.4 fL (ref 78.0–100.0)
Platelets: 113 10*3/uL — ABNORMAL LOW (ref 150–400)
RBC: 4.49 MIL/uL (ref 4.22–5.81)
RDW: 13.2 % (ref 11.5–15.5)
WBC: 5.6 10*3/uL (ref 4.0–10.5)

## 2014-07-12 LAB — BASIC METABOLIC PANEL
ANION GAP: 10 (ref 5–15)
BUN: 14 mg/dL (ref 6–23)
CHLORIDE: 100 meq/L (ref 96–112)
CO2: 29 mmol/L (ref 19–32)
CREATININE: 1.36 mg/dL — AB (ref 0.50–1.35)
Calcium: 9.2 mg/dL (ref 8.4–10.5)
GFR, EST AFRICAN AMERICAN: 59 mL/min — AB (ref >90)
GFR, EST NON AFRICAN AMERICAN: 51 mL/min — AB (ref >90)
Glucose, Bld: 157 mg/dL — ABNORMAL HIGH (ref 70–99)
Potassium: 4.3 mmol/L (ref 3.5–5.1)
Sodium: 139 mmol/L (ref 135–145)

## 2014-07-12 LAB — GLUCOSE, CAPILLARY: GLUCOSE-CAPILLARY: 170 mg/dL — AB (ref 70–99)

## 2014-07-12 MED ORDER — CARVEDILOL 25 MG PO TABS: 12.5000 mg | ORAL_TABLET | Freq: Two times a day (BID) | ORAL | Status: DC

## 2014-07-12 MED ORDER — TICAGRELOR 90 MG PO TABS: 90.0000 mg | ORAL_TABLET | Freq: Two times a day (BID) | ORAL | Status: DC

## 2014-07-12 MED FILL — Sodium Chloride IV Soln 0.9%: INTRAVENOUS | Qty: 50 | Status: AC

## 2014-07-12 NOTE — Discharge Summary (Signed)
Discharge Summary   Patient ID: Tyler Le MRN: [id number], DOB/AGE: [birth year] 71 y.o. Admit date: 07/11/2014 D/C date:     07/12/2014  Primary Care Provider: Kaleen Mask, MD Primary Cardiologist: Patty Sermons  Primary Discharge Diagnoses:  1. CAD/unstable angina with recent abnormal nuclear stress test - this admission: s/p stenting/PTCA of ostial SVG-ramus/OM - history: CABGx4 in 2001, BMS to VG-RI-OM in 08/2010 2. Essential HTN 3. Dyslipidemia 4. Renal insufficiency of uncertain chronicity 5. Obesity - Body mass index is 37.34 kg/(m^2). 6. Thrombocytopenia - unclear chronicity  Comprehensive PMH:  Past Medical History  Diagnosis Date  . Essential hypertension   . Dyslipidemia   . Coronary artery disease     a. 2001: s/p CABG X4;  b. 08/2010 s/p PCI/BMS to VG->RI->OM;  c. 06/2014 Cath/PCI: LM nl, LAD 137m, LCX 69m, RI 100, OM1 100, OM2 80, small, RCA 100ost, VG->RCA 100, VG->Diag 100, VG->RI->OM1 70-80 ISR (4.0x24 Promus DES), LIMA->LAD nl.  . Morbid obesity   . Dysrhythmia   . History of blood transfusion 2001    "related to [facility]"  . Anemia   . GERD (gastroesophageal reflux disease)   . Depression   . Anxiety   . Nephrolithiasis   . Complication of anesthesia     "they have a hard time waking him up"  . Type II diabetes mellitus   . Arthritis     "back, neck" (07/11/2014)  . Chronic lower back pain   . Thrombocytopenia     a. Borderline low platelets by labs noted in 06/2014 but also noted on prior labs as well.  . Renal insufficiency     a. noted 06/2014 - baseline unclear as no data since 2012.  Marland Kitchen RBBB    Hospital Course: Tyler Le is a 69 y/o M with history of CAD (CABGx4 in 2001, BMS->RI->OM in 08/2010), DM, HTN, HLD, obesity, former tobacco abuse who presented to [facility] for planned cardiac catheterization. He recently presented to the office complaining of exertional chest tightness. 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 Dr. Patty Sermons recommended cardiac cath to further evaluate. Of note, baseline pre-cath labs demonstrated a Cr of 1.56 which was up from the values in 2012 of 1.07. It is not clear if this represented an acute process versus CKD in the setting of his chronic diabetes. Dr. Patty Sermons stopped his lisinopril pre-cath and encouraged oral hydration. The patient was brought in for cath 07/11/14 which showed: Final Conclusions:  1. Severe 3 vessel obstructive CAD 2. Patent LIMA to the LAD. 3. Restenosis in the stent in SVG to ramus/OM 4. Occluded SVG to diagonal 5. Occluded SVG to RCA 6. Successful stenting of the SVG to ramus/OM ostium with a DES Dr. [place] recommended to continue DAPT indefinitely if possible (needs to take at least 1 year). The patient tolerated the procedure well. Pre-cath creatinine was 1.32, post-cath creatinine was 1.36. Note CrCl equates to 44ml/min. CBC stable post cath except for mild thrombocytopenia which appears to have been present on prior labs - patient instructed to f/u PCP for further evaluation. During this admission, BP tended to run higher yesterday - 180s-190s at times - but he had not taken his Coreg yet for the day. This was down to 150's this AM prior to AM meds. Per patient report  he was supposed to take Coreg 25mg  BID but was only taking it once a day at home for convenience. He is now amenable to taking twice a day given that he will need to take Brilinta twice a day as well - will ask him to take 1/2 tablet twice a day (12.5mg  BID). He was also instructed to monitor BP at home and call if running higher than 130/80. The patient declined cardiac rehab phase II but plans to increase activity gradually at the gym. Dr. Eldridge Dace has seen and examined the  patient today and feels he is stable for discharge.  I have left a message to our [facility] [employer] requesting a follow-up appointment, and our office will call the patient with this information. Will hold off on resuming ACEI at this time, pending follow-up in the office. Due to his renal insufficiency we will obtain a 48-hour BMET to ensure stability of Cr. Also - might consider f/u lipids/LFTs as outpatient to assess goal for statin therapy. He was given the 30 day free rx of Brilinta along with regular refills.  Vital Signs. BP 153/72 mmHg (before AM meds)  Pulse 89  Temp(Src) 97.4 F (36.3 C) (Oral)  Resp 18  Ht 6\' 3"  (1.905 m)  Wt 298 lb 11.6 oz (135.5 kg)  BMI 37.34 kg/m2  SpO2 97% General: Well developed obese WM, in no acute distress. Head: Normocephalic, atraumatic, sclera non-icteric, no xanthomas, nares are without discharge. Neck: Negative for carotid bruits. JVP not elevated. Lungs: Clear bilaterally to auscultation without wheezes, rales, or rhonchi. Breathing is unlabored. Heart: RRR S1 S2 without murmurs, rubs, or gallops.  Abdomen: Soft, non-tender, non-distended with normoactive bowel sounds. No rebound/guarding. Extremities: No clubbing or cyanosis. No edema. Distal pedal pulses are in tact. R pedal pulse was somewhat weaker but able to be obtained by doppler. R groin without hematoma, ecchymosis or bruit. Neuro: Alert and oriented X 3. Moves all extremities spontaneously. Psych:  Responds to questions appropriately with a normal affect. Labs: Lab Results  Component Value Date   WBC 5.6 07/12/2014   HGB 13.8 07/12/2014   HCT 39.7 07/12/2014   MCV 88.4 07/12/2014   PLT 113* 07/12/2014    Recent Labs Lab 07/12/14 0404  NA 139  K 4.3  CL 100  CO2 29  BUN 14  CREATININE 1.36*  CALCIUM 9.2  GLUCOSE 157*    Lab Results  Component Value Date   CHOL 130 05/09/2011   HDL 27* 05/09/2011   LDLCALC 31 05/09/2011   TRIG 359* 05/09/2011      Diagnostic Studies/Procedures   Dg Chest 2 View  06/14/2014   CLINICAL DATA:  Shortness of breath, cough, former smoking history  EXAM: CHEST  2 VIEW  COMPARISON:  Chest x-ray of 05/08/2011  FINDINGS: No active infiltrate or effusion is seen. Mediastinal and hilar contours are unremarkable. The heart is mildly enlarged and stable. Median sternotomy sutures are noted from prior CABG. There are mild degenerative changes in the lower thoracic spine  IMPRESSION: Stable mild cardiomegaly.  No active lung disease.   Electronically Signed   By: Dwyane Dee M.D.   On: 06/14/2014 15:42   Cardiac catheterization this admission, please see full report and above for summary.  Discharge Medications   Current Discharge Medication List    START taking these medications   Details  ticagrelor (BRILINTA) 90 MG TABS tablet Take 1 tablet (90 mg total) by mouth 2 (two) times daily. Qty: 60 tablet, Refills:  11      CONTINUE these medications which have CHANGED   Details  carvedilol (COREG) 25 MG tablet Take 0.5 tablets (12.5 mg total) by mouth 2 (two) times daily with a meal. Qty: 60 tablet, Refills: 6      CONTINUE these medications which have NOT CHANGED   Details  amLODipine (NORVASC) 10 MG tablet Take 1 tablet (10 mg total) by mouth daily.     aspirin 81 MG tablet Take 1 tablet (81 mg total) by mouth daily.    Blood Glucose Monitoring Suppl (FREESTYLE FREEDOM) KIT     busPIRone (BUSPAR) 15 MG tablet Take 15 mg by mouth daily.     Cholecalciferol (VITAMIN D3) 2000 UNITS TABS Take 2 tablets by mouth daily.      CYMBALTA 60 MG capsule Take 60 mg by mouth daily.     HYDROcodone-acetaminophen (NORCO) 10-325 MG per tablet Take 1 tablet by mouth every 4 (four) hours as needed. For pain     insulin glargine (LANTUS) 100 UNIT/ML injection Inject 64 Units into the skin at bedtime.     isosorbide mononitrate (IMDUR) 30 MG 24 hr tablet Take 30 mg by mouth at bedtime. Take whole tablet daily at  night     mirtazapine (REMERON) 15 MG tablet Take 15 mg by mouth at bedtime.     Multiple Vitamin (MULTIVITAMIN) tablet Take 1 tablet by mouth daily.     nitroGLYCERIN (NITROSTAT) 0.4 MG SL tablet Place 1 tablet (0.4 mg total) under the tongue every 5 (five) minutes as needed for chest pain (up to 3 doses).    Associated Diagnoses: Chest pain, unspecified chest pain type    NOVOLOG FLEXPEN 100 UNIT/ML FlexPen Inject 24 Units into the skin 2 (two) times daily.     omeprazole (PRILOSEC) 20 MG capsule Take 20 mg by mouth 2 (two) times daily before a meal.      Pramlintide Acetate (SYMLINPEN 120) 2700 MCG/2.7ML SOLN Inject 60 Units into the skin 2 (two) times daily before a meal.      QUEtiapine (SEROQUEL) 400 MG tablet 300 mg as directed.     simvastatin (ZOCOR) 40 MG tablet Take 20 mg by mouth at bedtime.          Disposition   The patient will be discharged in stable condition to home. Discharge Instructions    Diet - low sodium heart healthy    Complete by:  As directed      Increase activity slowly    Complete by:  As directed   No driving for 2 days. No lifting over 5 lbs for 1 week. No sexual activity for 1 week. Keep procedure site clean & dry. If you notice increased pain, swelling, bleeding or pus, call/return!  You may shower, but no soaking baths/hot tubs/pools for 1 week.  - Please monitor your blood pressure occasionally at home. Call your doctor if you tend to get readings of greater than 130 on the top number or 80 on the bottom number.  - Please follow up with your primary care doctor to evaluate/monitor your platelet count if this has not been previously evaluated. Your count was mildly low.          Follow-up Information    Follow up with [facility].   Specialty:  Cardiology   Why:  Due to your mildly abnormal renal function, please come back to Dr. Yevonne Pax office on Thursday 07/14/14 for labwork (BMET) - the lab is open  between  8am-4pm.   Contact information:   [address] [place] [address] [phone]      Follow up with Cassell Clement, MD.   Specialty:  Cardiology   Why:  Office will call you for your followup appointment. Call office if you have not heard back in 3 days.   Contact information:   [address] [place] [address] [phone]         Duration of Discharge Encounter: Greater than 30 minutes including physician and PA time.  Signed, Dayna Dunn PA-C 07/12/2014, 10:10 AM  I have examined the patient and reviewed assessment and plan and discussed with patient.  Agree with above as stated.  S/p PCI.  RIght groin stable.  Continue DAPT or at least a year. Cardiac rehab.  Dietary counseling may be helpful.  At his bedside were a bag from [employer] and a box from [employer].    Kieron Kantner S.

## 2014-07-12 NOTE — Progress Notes (Signed)
CARDIAC REHAB PHASE I   PRE:  Rate/Rhythm: 91 SR  BP:  Supine: 160/70  Sitting:   Standing:    SaO2: 94%RA  MODE:  Ambulation: 300 ft   POST:  Rate/Rhythm: 108 ST  BP:  Supine:   Sitting: 153/57  Standing:    SaO2: 95%RA 0815-0905 Pt walked 300 ft on RA with steady gait but limited by arthritis. No CP. Pt has stent booklet and gave brililnta booklet. Pt has attended CRP 2 before in 2001 and wants to exercise at the gym now. Wife stated she would go with him if he went to gym. Pt stated he thought he could do stationary bike. Encouraged pt to do walking as tolerated now and start gym after follow up visit to make sure groin ok. Pt given verbal instruction on trying to work up to 30 mins on bike but starting slowly. Pt stated he knows what he should eat with his diabetes but doing it is his concern. He likes bread. Discussed replacing breads with protein in morning to keep fuller. Pt knows which foods will run sugars up. Gave diabetic and heart healthy diets for reference.  Reviewed NTG use. Wife and pt voiced understanding.   Luetta Nuttingharlene Dejour Vos, RN BSN  07/12/2014 8:56 AM

## 2014-07-13 ENCOUNTER — Telehealth: Payer: Self-pay | Admitting: Internal Medicine

## 2014-07-13 ENCOUNTER — Other Ambulatory Visit (INDEPENDENT_AMBULATORY_CARE_PROVIDER_SITE_OTHER): Payer: Medicare Other | Admitting: *Deleted

## 2014-07-13 DIAGNOSIS — N289 Disorder of kidney and ureter, unspecified: Secondary | ICD-10-CM

## 2014-07-13 LAB — BASIC METABOLIC PANEL
BUN: 17 mg/dL (ref 6–23)
CO2: 30 mEq/L (ref 19–32)
Calcium: 9.1 mg/dL (ref 8.4–10.5)
Chloride: 101 mEq/L (ref 96–112)
Creatinine, Ser: 1.39 mg/dL (ref 0.40–1.50)
GFR: 53.59 mL/min — AB (ref >60.00)
GLUCOSE: 239 mg/dL — AB (ref 70–99)
POTASSIUM: 4 meq/L (ref 3.5–5.1)
Sodium: 136 mEq/L (ref 135–145)

## 2014-07-13 NOTE — Telephone Encounter (Deleted)
Error Close Encounter// rerouting message//sr

## 2014-07-13 NOTE — Telephone Encounter (Signed)
error 

## 2014-07-13 NOTE — Telephone Encounter (Deleted)
New Message ° °Pt called requests a call back to discuss lab results and if he should continue the medication. Please call back to discuss ° °

## 2014-07-18 ENCOUNTER — Telehealth: Payer: Self-pay | Admitting: *Deleted

## 2014-07-18 NOTE — Telephone Encounter (Signed)
lmptcb on both home and cell to go over results

## 2014-07-18 NOTE — Telephone Encounter (Signed)
both pt and his wife have been notified about lab results with verbal understanding. Confirmed Dr. Patty SermonsBrackbill appt 2/3..Marland Kitchen

## 2014-07-27 ENCOUNTER — Encounter: Payer: Self-pay | Admitting: Cardiology

## 2014-07-27 ENCOUNTER — Ambulatory Visit (INDEPENDENT_AMBULATORY_CARE_PROVIDER_SITE_OTHER): Payer: Medicare Other | Admitting: Cardiology

## 2014-07-27 VITALS — BP 128/78 | HR 96 | Ht 76.0 in | Wt 289.0 lb

## 2014-07-27 DIAGNOSIS — R079 Chest pain, unspecified: Secondary | ICD-10-CM

## 2014-07-27 DIAGNOSIS — F32A Depression, unspecified: Secondary | ICD-10-CM

## 2014-07-27 DIAGNOSIS — F329 Major depressive disorder, single episode, unspecified: Secondary | ICD-10-CM

## 2014-07-27 DIAGNOSIS — I119 Hypertensive heart disease without heart failure: Secondary | ICD-10-CM

## 2014-07-27 DIAGNOSIS — I259 Chronic ischemic heart disease, unspecified: Secondary | ICD-10-CM

## 2014-07-27 NOTE — Patient Instructions (Signed)
Your physician recommends that you continue on your current medications as directed. Please refer to the Current Medication list given to you today.  Your physician wants you to follow-up in: 6 month ov/ekg You will receive a reminder letter in the mail two months in advance. If you don't receive a letter, please call our office to schedule the follow-up appointment.  

## 2014-07-27 NOTE — Progress Notes (Signed)
Cardiology Office Note   Date:  07/27/2014   ID:  Tyler Le, DOB [birth year], MRN [id number]  PCP:  Leonard Downing, MD  Cardiologist:   Darlin Coco, MD   No chief complaint on file.     History of Present Illness: Tyler Le is a 69 y.o. male who presents for follow-up office visit This pleasant 69 year old gentleman is seen for a post hospital office visit. He has a history of known ischemic heart disease. He underwent coronary artery bypass graft surgery in 2001. In March 2012 he underwent bare-metal stent placement to the saphenous vein graft to the intermediate and obtuse marginal. He has been on aspirin 81. The patient was seen in December 2015 complaining of more frequent chest pain The patient had a Lexiscan Myoview stress test on 06/29/14 which showed Intermediate risk stress nuclear study demonstrating a mostly reversible defect along the mid to distal anterior wall/apical wall distribution. 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 Cardiac catheterization was recommended. On 07/11/14 patient underwent cardiac catheterization by Dr. Peter Martinique with the following findings: 1. Severe 3 vessel obstructive CAD 2. Patent LIMA to the LAD. 3. Restenosis in the stent in SVG to ramus/OM 4. Occluded SVG to diagonal 5. Occluded SVG to RCA 6. Successful stenting of the SVG to ramus/OM ostium with a DES.   Recommendations:  Continue DAPT indefinitely if possible. Needs to take at least one year.   Past Medical History  Diagnosis Date  . Essential hypertension   . Dyslipidemia   . Coronary artery disease     a. 2001: s/p CABG X4;  b. 08/2010 s/p PCI/BMS to VG->RI->OM;  c. 06/2014 Cath/PCI: LM nl, LAD 170m LCX 739mRI 100, OM1 100, OM2 80, small, RCA 100ost, VG->RCA 100, VG->Diag 100, VG->RI->OM1 70-80 ISR (4.0x24 Promus DES), LIMA->LAD nl.  . Morbid obesity   . Dysrhythmia   . History of blood transfusion 2001    "related to  [facility]"  . Anemia   . GERD (gastroesophageal reflux disease)   . Depression   . Anxiety   . Nephrolithiasis   . Complication of anesthesia     "they have a hard time waking him up"  . Type II diabetes mellitus   . Arthritis     "back, neck" (07/11/2014)  . Chronic lower back pain   . Thrombocytopenia     a. Borderline low platelets by labs noted in 06/2014 but also noted on prior labs as well.  . Renal insufficiency     a. noted 06/2014 - baseline unclear as no data since 2012.  . Marland KitchenBBB     Past Surgical History  Procedure Laterality Date  . Cardiac catheterization  2001; ~ 2010  . Coronary artery bypass graft  2001    "CABG X 5"  . Appendectomy  ~ 1955  . Lumbar laminectomy  01/2011  . Back surgery    . Cholecystectomy    . Shoulder arthroscopy w/ rotator cuff repair Left 1990's  . Cardiovascular stress test  07/03/2009    EF 64%  . Left heart catheterization with coronary/graft angiogram N/A 05/09/2011    Procedure: LEFT HEART CATHETERIZATION WITH COBeatrix Fetters Surgeon: ChBurnell BlanksMD;  [facility];  Service: Cardiovascular;  Laterality: N/A;  . Tonsillectomy and adenoidectomy  1951  . Coronary angioplasty with stent placement  March 2012    BMS to SVG to intermediate/OM  . Coronary angioplasty with stent placement  07/11/2014    "1"  . Left heart catheterization with coronary angiogram N/A 07/11/2014    Procedure: LEFT HEART CATHETERIZATION WITH CORONARY ANGIOGRAM;  Surgeon: Peter M Martinique, MD;  Location: [facility] CATH LAB;  Service: Cardiovascular;  Laterality: N/A;     Current Outpatient Prescriptions  Medication Sig Dispense Refill  . amLODipine (NORVASC) 10 MG tablet Take 1 tablet (10 mg total) by mouth daily. 30 tablet 3  . Blood Glucose Monitoring Suppl (FREESTYLE FREEDOM) KIT     . busPIRone (BUSPAR) 15 MG tablet Take 15 mg by mouth daily.     . carvedilol (COREG) 25 MG tablet Take 0.5 tablets (12.5 mg total) by mouth 2 (two) times daily  with a meal. 60 tablet 6  . Cholecalciferol (VITAMIN D3) 2000 UNITS TABS Take 2 tablets by mouth daily.      . CYMBALTA 60 MG capsule Take 60 mg by mouth daily.     Marland Kitchen HYDROcodone-acetaminophen (NORCO) 10-325 MG per tablet Take 1 tablet by mouth every 4 (four) hours as needed. For pain     . insulin glargine (LANTUS) 100 UNIT/ML injection Inject 64 Units into the skin at bedtime.     . isosorbide mononitrate (IMDUR) 30 MG 24 hr tablet Take 30 mg by mouth at bedtime. Take whole tablet daily at night     . mirtazapine (REMERON) 15 MG tablet Take 15 mg by mouth at bedtime.     . Multiple Vitamin (MULTIVITAMIN) tablet Take 1 tablet by mouth daily.     . nitroGLYCERIN (NITROSTAT) 0.4 MG SL tablet Place 1 tablet (0.4 mg total) under the tongue every 5 (five) minutes as needed for chest pain (up to 3 doses). 100 tablet 4  . NOVOLOG FLEXPEN 100 UNIT/ML FlexPen Inject 24 Units into the skin 2 (two) times daily.     Marland Kitchen omeprazole (PRILOSEC) 20 MG capsule Take 20 mg by mouth as needed.     . Pramlintide Acetate (SYMLINPEN 120) 2700 MCG/2.7ML SOLN Inject 60 Units into the skin 2 (two) times daily before a meal.      . QUEtiapine (SEROQUEL) 400 MG tablet 300 mg as directed.     . simvastatin (ZOCOR) 40 MG tablet Take 20 mg by mouth at bedtime.      . ticagrelor (BRILINTA) 90 MG TABS tablet Take 1 tablet (90 mg total) by mouth 2 (two) times daily. 60 tablet 11   No current facility-administered medications for this visit.    Allergies:   Metformin and related; Pravachol; Ramipril; and Wellbutrin    Social History:  The patient  reports that he has quit smoking. His smoking use included Cigarettes. He has a 100 pack-year smoking history. He has quit using smokeless tobacco. His smokeless tobacco use included Chew. He reports that he drinks alcohol. He reports that he does not use illicit drugs.   Family History:  The patient's family history includes Heart attack in his father; Heart disease in his brother,  father, and mother.    ROS:  Please see the history of present illness.   Otherwise, review of systems are positive for none.   All other systems are reviewed and negative.    PHYSICAL EXAM: VS:  BP 128/78 mmHg  Pulse 96  Ht '6\' 4"'  (1.93 m)  Wt 289 lb (131.09 kg)  BMI 35.19 kg/m2 , BMI Body mass index is 35.19 kg/(m^2). GEN: Well nourished, well developed, in no acute distress HEENT: normal Neck: no JVD, carotid bruits, or masses Cardiac: RRR;  no murmurs, rubs, or gallops,no edema  Respiratory:  clear to auscultation bilaterally, normal work of breathing GI: soft, nontender, nondistended, + BS MS: no deformity or atrophy Skin: warm and dry, no rash Neuro:  Strength and sensation are intact Psych: euthymic mood, full affect   EKG:  EKG is not ordered today.    Recent Labs: 07/12/2014: Hemoglobin 13.8; Platelets 113* 07/13/2014: BUN 17; Creatinine 1.39; Potassium 4.0; Sodium 136    Lipid Panel    Component Value Date/Time   CHOL 130 05/09/2011 0505   TRIG 359* 05/09/2011 0505   HDL 27* 05/09/2011 0505   CHOLHDL 4.8 05/09/2011 0505   VLDL 72* 05/09/2011 0505   LDLCALC 31 05/09/2011 0505      Wt Readings from Last 3 Encounters:  07/27/14 289 lb (131.09 kg)  07/12/14 298 lb 11.6 oz (135.5 kg)  07/07/14 292 lb (132.45 kg)        ASSESSMENT AND PLAN:  1. Ischemic heart disease.  Recent successful stent to saphenous vein graft ramus/obtuse marginal 2. Status post CABG 3. Right bundle branch block 4. Depression 5. Dyslipidemia 6. Diabetes mellitus type 2 7. Chronic low back pain 8. Mild renal insufficiency     Current medicines are reviewed at length with the patient today.  The patient does not have concerns regarding medicines.  The following changes have been made:  no change  Labs/ tests ordered today include:  No orders of the defined types were placed in this encounter.     Disposition:   FU with Dr. Mare Ferrari in 6 months for office visit  and EKG. He gets his lab work from Dr. Arelia Sneddon and from the [facility] Continue efforts at weight loss   Signed, Darlin Coco, MD  07/27/2014 2:53 PM    [address], [place], [place]  [address] Phone: [phone]; Fax: [phone]

## 2014-08-01 ENCOUNTER — Inpatient Hospital Stay (HOSPITAL_COMMUNITY)
Admission: EM | Admit: 2014-08-01 | Discharge: 2014-08-03 | DRG: 392 | Disposition: A | Discharge status: Home / Self Care | Payer: Medicare Other | Attending: Internal Medicine | Admitting: Internal Medicine

## 2014-08-01 ENCOUNTER — Encounter (HOSPITAL_COMMUNITY): Payer: Self-pay | Admitting: Emergency Medicine

## 2014-08-01 DIAGNOSIS — F17220 Nicotine dependence, chewing tobacco, uncomplicated: Secondary | ICD-10-CM | POA: Diagnosis present

## 2014-08-01 DIAGNOSIS — Z794 Long term (current) use of insulin: Secondary | ICD-10-CM

## 2014-08-01 DIAGNOSIS — D696 Thrombocytopenia, unspecified: Secondary | ICD-10-CM | POA: Diagnosis present

## 2014-08-01 DIAGNOSIS — R197 Diarrhea, unspecified: Secondary | ICD-10-CM

## 2014-08-01 DIAGNOSIS — Z6834 Body mass index (BMI) 34.0-34.9, adult: Secondary | ICD-10-CM

## 2014-08-01 DIAGNOSIS — G8929 Other chronic pain: Secondary | ICD-10-CM | POA: Diagnosis present

## 2014-08-01 DIAGNOSIS — E860 Dehydration: Secondary | ICD-10-CM | POA: Diagnosis present

## 2014-08-01 DIAGNOSIS — Z9049 Acquired absence of other specified parts of digestive tract: Secondary | ICD-10-CM | POA: Diagnosis present

## 2014-08-01 DIAGNOSIS — I2510 Atherosclerotic heart disease of native coronary artery without angina pectoris: Secondary | ICD-10-CM | POA: Diagnosis present

## 2014-08-01 DIAGNOSIS — Z888 Allergy status to other drugs, medicaments and biological substances status: Secondary | ICD-10-CM | POA: Diagnosis not present

## 2014-08-01 DIAGNOSIS — E669 Obesity, unspecified: Secondary | ICD-10-CM

## 2014-08-01 DIAGNOSIS — E785 Hyperlipidemia, unspecified: Secondary | ICD-10-CM | POA: Diagnosis present

## 2014-08-01 DIAGNOSIS — E1169 Type 2 diabetes mellitus with other specified complication: Secondary | ICD-10-CM | POA: Diagnosis present

## 2014-08-01 DIAGNOSIS — K219 Gastro-esophageal reflux disease without esophagitis: Secondary | ICD-10-CM | POA: Diagnosis present

## 2014-08-01 DIAGNOSIS — Z951 Presence of aortocoronary bypass graft: Secondary | ICD-10-CM

## 2014-08-01 DIAGNOSIS — I10 Essential (primary) hypertension: Secondary | ICD-10-CM | POA: Diagnosis present

## 2014-08-01 DIAGNOSIS — E1165 Type 2 diabetes mellitus with hyperglycemia: Secondary | ICD-10-CM | POA: Diagnosis present

## 2014-08-01 DIAGNOSIS — A084 Viral intestinal infection, unspecified: Principal | ICD-10-CM | POA: Diagnosis present

## 2014-08-01 DIAGNOSIS — N179 Acute kidney failure, unspecified: Secondary | ICD-10-CM | POA: Diagnosis present

## 2014-08-01 DIAGNOSIS — Z87442 Personal history of urinary calculi: Secondary | ICD-10-CM | POA: Diagnosis not present

## 2014-08-01 DIAGNOSIS — F329 Major depressive disorder, single episode, unspecified: Secondary | ICD-10-CM | POA: Diagnosis present

## 2014-08-01 DIAGNOSIS — F419 Anxiety disorder, unspecified: Secondary | ICD-10-CM | POA: Diagnosis present

## 2014-08-01 DIAGNOSIS — M545 Low back pain: Secondary | ICD-10-CM | POA: Diagnosis present

## 2014-08-01 DIAGNOSIS — Z9861 Coronary angioplasty status: Secondary | ICD-10-CM | POA: Diagnosis not present

## 2014-08-01 DIAGNOSIS — K529 Noninfective gastroenteritis and colitis, unspecified: Secondary | ICD-10-CM

## 2014-08-01 DIAGNOSIS — N19 Unspecified kidney failure: Secondary | ICD-10-CM | POA: Insufficient documentation

## 2014-08-01 DIAGNOSIS — M1990 Unspecified osteoarthritis, unspecified site: Secondary | ICD-10-CM | POA: Diagnosis present

## 2014-08-01 LAB — CBC WITH DIFFERENTIAL/PLATELET
BASOS ABS: 0 10*3/uL (ref 0.0–0.1)
Basophils Relative: 0 % (ref 0–1)
Eosinophils Absolute: 0.1 10*3/uL (ref 0.0–0.7)
Eosinophils Relative: 1 % (ref 0–5)
HCT: 43.8 % (ref 39.0–52.0)
HEMOGLOBIN: 14.9 g/dL (ref 13.0–17.0)
Lymphocytes Relative: 23 % (ref 12–46)
Lymphs Abs: 1.8 10*3/uL (ref 0.7–4.0)
MCH: 30.7 pg (ref 26.0–34.0)
MCHC: 34 g/dL (ref 30.0–36.0)
MCV: 90.3 fL (ref 78.0–100.0)
MONO ABS: 0.6 10*3/uL (ref 0.1–1.0)
Monocytes Relative: 8 % (ref 3–12)
NEUTROS PCT: 68 % (ref 43–77)
Neutro Abs: 5.1 10*3/uL (ref 1.7–7.7)
Platelets: 149 10*3/uL — ABNORMAL LOW (ref 150–400)
RBC: 4.85 MIL/uL (ref 4.22–5.81)
RDW: 13.3 % (ref 11.5–15.5)
WBC: 7.6 10*3/uL (ref 4.0–10.5)

## 2014-08-01 LAB — COMPREHENSIVE METABOLIC PANEL
ALT: 35 U/L (ref 0–53)
AST: 34 U/L (ref 0–37)
Albumin: 3.8 g/dL (ref 3.5–5.2)
Alkaline Phosphatase: 81 U/L (ref 39–117)
Anion gap: 10 (ref 5–15)
BUN: 32 mg/dL — AB (ref 6–23)
CHLORIDE: 101 mmol/L (ref 96–112)
CO2: 25 mmol/L (ref 19–32)
CREATININE: 2.1 mg/dL — AB (ref 0.50–1.35)
Calcium: 9.2 mg/dL (ref 8.4–10.5)
GFR calc non Af Amer: 30 mL/min — ABNORMAL LOW (ref >90)
GFR, EST AFRICAN AMERICAN: 35 mL/min — AB (ref >90)
GLUCOSE: 296 mg/dL — AB (ref 70–99)
Potassium: 4.5 mmol/L (ref 3.5–5.1)
SODIUM: 136 mmol/L (ref 135–145)
TOTAL PROTEIN: 7.5 g/dL (ref 6.0–8.3)
Total Bilirubin: 0.9 mg/dL (ref 0.3–1.2)

## 2014-08-01 LAB — URINALYSIS, ROUTINE W REFLEX MICROSCOPIC
Glucose, UA: 100 mg/dL — AB
HGB URINE DIPSTICK: NEGATIVE
Ketones, ur: 15 mg/dL — AB
Leukocytes, UA: NEGATIVE
Nitrite: NEGATIVE
PH: 5.5 (ref 5.0–8.0)
PROTEIN: 30 mg/dL — AB
Specific Gravity, Urine: 1.029 (ref 1.005–1.030)
UROBILINOGEN UA: 1 mg/dL (ref 0.0–1.0)

## 2014-08-01 LAB — URINE MICROSCOPIC-ADD ON

## 2014-08-01 LAB — LIPASE, BLOOD: LIPASE: 22 U/L (ref 11–59)

## 2014-08-01 MED ORDER — NITROGLYCERIN 0.4 MG SL SUBL: 0.4000 mg | SUBLINGUAL_TABLET | SUBLINGUAL | Status: DC | PRN

## 2014-08-01 MED ORDER — ONDANSETRON HCL 4 MG PO TABS: 4.0000 mg | ORAL_TABLET | Freq: Four times a day (QID) | ORAL | Status: DC | PRN

## 2014-08-01 MED ORDER — ACETAMINOPHEN 650 MG RE SUPP: 650.0000 mg | Freq: Four times a day (QID) | RECTAL | Status: DC | PRN

## 2014-08-01 MED ORDER — SODIUM CHLORIDE 0.9 % IV SOLN
INTRAVENOUS | Status: DC
  Administered 2014-08-01: 14:00:00 via INTRAVENOUS

## 2014-08-01 MED ORDER — ONE-DAILY MULTI VITAMINS PO TABS: 1.0000 | ORAL_TABLET | Freq: Every day | ORAL | Status: DC

## 2014-08-01 MED ORDER — ACETAMINOPHEN 325 MG PO TABS: 650.0000 mg | ORAL_TABLET | Freq: Four times a day (QID) | ORAL | Status: DC | PRN

## 2014-08-01 MED ORDER — QUETIAPINE FUMARATE 300 MG PO TABS
300.0000 mg | ORAL_TABLET | Freq: Every day | ORAL | Status: DC
  Administered 2014-08-01 – 2014-08-02 (×2): 300 mg via ORAL
  Filled 2014-08-01 (×2): qty 1
  Filled 2014-08-01: qty 3
  Filled 2014-08-01: qty 1

## 2014-08-01 MED ORDER — ADULT MULTIVITAMIN W/MINERALS CH
1.0000 | ORAL_TABLET | Freq: Every day | ORAL | Status: DC
  Administered 2014-08-01 – 2014-08-03 (×3): 1 via ORAL
  Filled 2014-08-01 (×3): qty 1

## 2014-08-01 MED ORDER — SIMVASTATIN 20 MG PO TABS
20.0000 mg | ORAL_TABLET | Freq: Every day | ORAL | Status: DC
  Administered 2014-08-01 – 2014-08-02 (×2): 20 mg via ORAL
  Filled 2014-08-01 (×4): qty 1

## 2014-08-01 MED ORDER — BUSPIRONE HCL 15 MG PO TABS
15.0000 mg | ORAL_TABLET | Freq: Every day | ORAL | Status: DC
  Administered 2014-08-01 – 2014-08-03 (×3): 15 mg via ORAL
  Filled 2014-08-01 (×3): qty 1

## 2014-08-01 MED ORDER — ONDANSETRON HCL 4 MG/2ML IJ SOLN: 4.0000 mg | Freq: Four times a day (QID) | INTRAMUSCULAR | Status: DC | PRN

## 2014-08-01 MED ORDER — INSULIN ASPART 100 UNIT/ML ~~LOC~~ SOLN
0.0000 [IU] | Freq: Three times a day (TID) | SUBCUTANEOUS | Status: DC
  Administered 2014-08-01: 2 [IU] via SUBCUTANEOUS
  Administered 2014-08-02: 7 [IU] via SUBCUTANEOUS
  Administered 2014-08-02: 3 [IU] via SUBCUTANEOUS

## 2014-08-01 MED ORDER — MIRTAZAPINE 15 MG PO TABS
15.0000 mg | ORAL_TABLET | Freq: Every day | ORAL | Status: DC
  Administered 2014-08-01 – 2014-08-02 (×2): 15 mg via ORAL
  Filled 2014-08-01 (×3): qty 1

## 2014-08-01 MED ORDER — TICAGRELOR 90 MG PO TABS
90.0000 mg | ORAL_TABLET | Freq: Two times a day (BID) | ORAL | Status: DC
  Administered 2014-08-01 – 2014-08-03 (×4): 90 mg via ORAL
  Filled 2014-08-01 (×6): qty 1

## 2014-08-01 MED ORDER — VITAMIN D3 50 MCG (2000 UT) PO TABS: 2.0000 | ORAL_TABLET | Freq: Every day | ORAL | Status: DC

## 2014-08-01 MED ORDER — VITAMIN D3 25 MCG (1000 UNIT) PO TABS
2000.0000 [IU] | ORAL_TABLET | Freq: Every day | ORAL | Status: DC
  Administered 2014-08-01 – 2014-08-03 (×3): 2000 [IU] via ORAL
  Filled 2014-08-01 (×4): qty 2

## 2014-08-01 MED ORDER — HYDROCODONE-ACETAMINOPHEN 10-325 MG PO TABS: 1.0000 | ORAL_TABLET | Freq: Four times a day (QID) | ORAL | Status: DC | PRN

## 2014-08-01 MED ORDER — HEPARIN SODIUM (PORCINE) 5000 UNIT/ML IJ SOLN
5000.0000 [IU] | Freq: Three times a day (TID) | INTRAMUSCULAR | Status: DC
  Administered 2014-08-01 – 2014-08-03 (×5): 5000 [IU] via SUBCUTANEOUS
  Filled 2014-08-01 (×8): qty 1

## 2014-08-01 MED ORDER — INSULIN GLARGINE 100 UNIT/ML ~~LOC~~ SOLN
40.0000 [IU] | Freq: Every day | SUBCUTANEOUS | Status: DC
  Administered 2014-08-01: 40 [IU] via SUBCUTANEOUS
  Filled 2014-08-01 (×2): qty 0.4

## 2014-08-01 MED ORDER — SODIUM CHLORIDE 0.9 % IV BOLUS (SEPSIS)
500.0000 mL | Freq: Once | INTRAVENOUS | Status: AC
  Administered 2014-08-01: 500 mL via INTRAVENOUS

## 2014-08-01 MED ORDER — DULOXETINE HCL 60 MG PO CPEP
60.0000 mg | ORAL_CAPSULE | Freq: Every day | ORAL | Status: DC
  Administered 2014-08-01 – 2014-08-03 (×3): 60 mg via ORAL
  Filled 2014-08-01 (×3): qty 1

## 2014-08-01 MED ORDER — SODIUM CHLORIDE 0.9 % IV SOLN
INTRAVENOUS | Status: DC
  Administered 2014-08-01 – 2014-08-02 (×3): via INTRAVENOUS

## 2014-08-01 MED ORDER — CARVEDILOL 12.5 MG PO TABS
12.5000 mg | ORAL_TABLET | Freq: Two times a day (BID) | ORAL | Status: DC
  Administered 2014-08-01 – 2014-08-03 (×4): 12.5 mg via ORAL
  Filled 2014-08-01 (×7): qty 1

## 2014-08-01 NOTE — ED Notes (Signed)
Admitting MD at the bedside.  

## 2014-08-01 NOTE — ED Provider Notes (Signed)
CSN: [id number]     Arrival date & time 08/01/14  1010 History   First MD Initiated Contact with Patient 08/01/14 1323     Chief Complaint  Patient presents with  . Nausea  . Diarrhea     (Consider location/radiation/quality/duration/timing/severity/associated sxs/prior Treatment) HPI Comments: Patient here complaining of nausea vomiting diarrhea 3 days. Has had positive sick exposure. Diarrhea has been watery without blood. His emesis has been nonbloody or green. No fever noted. No severe abdominal pain. Denies any dizziness when standing. No recent travel history. No recent antibiotic use. Denies any myalgias. Symptoms persistent and nothing makes them better worse. Ultimately used prior to arrival. Was seen by his doctor today and sent here for further evaluation.  Patient is a 69 y.o. male presenting with diarrhea. The history is provided by the patient.  Diarrhea   Past Medical History  Diagnosis Date  . Essential hypertension   . Dyslipidemia   . Coronary artery disease     a. 2001: s/p CABG X4;  b. 08/2010 s/p PCI/BMS to VG->RI->OM;  c. 06/2014 Cath/PCI: LM nl, LAD 128m LCX 786mRI 100, OM1 100, OM2 80, small, RCA 100ost, VG->RCA 100, VG->Diag 100, VG->RI->OM1 70-80 ISR (4.0x24 Promus DES), LIMA->LAD nl.  . Morbid obesity   . Dysrhythmia   . History of blood transfusion 2001    "related to OHS"  . Anemia   . GERD (gastroesophageal reflux disease)   . Depression   . Anxiety   . Nephrolithiasis   . Complication of anesthesia     "they have a hard time waking him up"  . Type II diabetes mellitus   . Arthritis     "back, neck" (07/11/2014)  . Chronic lower back pain   . Thrombocytopenia     a. Borderline low platelets by labs noted in 06/2014 but also noted on prior labs as well.  . Renal insufficiency     a. noted 06/2014 - baseline unclear as no data since 2012.  . Marland KitchenBBB    Past Surgical History  Procedure Laterality Date  . Cardiac catheterization  2001; ~ 2010  .  Coronary artery bypass graft  2001    "CABG X 5"  . Appendectomy  ~ 1955  . Lumbar laminectomy  01/2011  . Back surgery    . Cholecystectomy    . Shoulder arthroscopy w/ rotator cuff repair Left 1990's  . Cardiovascular stress test  07/03/2009    EF 64%  . Left heart catheterization with coronary/graft angiogram N/A 05/09/2011    Procedure: LEFT HEART CATHETERIZATION WITH COBeatrix Fetters Surgeon: ChBurnell BlanksMD;  [facility];  Service: Cardiovascular;  Laterality: N/A;  . Tonsillectomy and adenoidectomy  1951  . Coronary angioplasty with stent placement  March 2012    BMS to SVG to intermediate/OM  . Coronary angioplasty with stent placement  07/11/2014    "1"  . Left heart catheterization with coronary angiogram N/A 07/11/2014    Procedure: LEFT HEART CATHETERIZATION WITH CORONARY ANGIOGRAM;  Surgeon: Peter M JoMartiniqueMD;  [facility];  Service: Cardiovascular;  Laterality: N/A;   Family History  Problem Relation Age of Onset  . Heart disease Mother   . Heart disease Father   . Heart attack Father   . Heart disease Brother    History  Substance Use Topics  . Smoking status: Former Smoker -- 4.00 packs/day for 25 years    Types: Cigarettes  . Smokeless tobacco: Former [employer] developer  Types: Chew     Comment: "quit smoking ~ 1985; chewed off and on for a few years; quit chewing in ~ 1985 too"  . Alcohol Use: Yes     Comment: 07/11/2014 "might take a drink 5X/yr"    Review of Systems  Gastrointestinal: Positive for diarrhea.  All other systems reviewed and are negative.     Allergies  Metformin and related; Pravachol; Ramipril; and Wellbutrin  Home Medications   Prior to Admission medications   Medication Sig Start Date End Date Taking? Authorizing Provider  amLODipine (NORVASC) 10 MG tablet Take 1 tablet (10 mg total) by mouth daily. 05/10/11   Dayna N Dunn, PA-C  Blood Glucose Monitoring Suppl (FREESTYLE FREEDOM) KIT  03/27/12    Historical Provider, MD  busPIRone (BUSPAR) 15 MG tablet Take 15 mg by mouth daily.  03/14/12   Historical Provider, MD  carvedilol (COREG) 25 MG tablet Take 0.5 tablets (12.5 mg total) by mouth 2 (two) times daily with a meal. 07/12/14   Dayna N Dunn, PA-C  Cholecalciferol (VITAMIN D3) 2000 UNITS TABS Take 2 tablets by mouth daily.      Historical Provider, MD  CYMBALTA 60 MG capsule Take 60 mg by mouth daily.  03/30/12   Historical Provider, MD  HYDROcodone-acetaminophen (NORCO) 10-325 MG per tablet Take 1 tablet by mouth every 4 (four) hours as needed. For pain     Historical Provider, MD  insulin glargine (LANTUS) 100 UNIT/ML injection Inject 64 Units into the skin at bedtime.     Historical Provider, MD  isosorbide mononitrate (IMDUR) 30 MG 24 hr tablet Take 30 mg by mouth at bedtime. Take whole tablet daily at night  01/15/11   Burtis Junes, NP  mirtazapine (REMERON) 15 MG tablet Take 15 mg by mouth at bedtime.  03/30/12   Historical Provider, MD  Multiple Vitamin (MULTIVITAMIN) tablet Take 1 tablet by mouth daily.     Historical Provider, MD  nitroGLYCERIN (NITROSTAT) 0.4 MG SL tablet Place 1 tablet (0.4 mg total) under the tongue every 5 (five) minutes as needed for chest pain (up to 3 doses). 06/14/14 10/12/15  Darlin Coco, MD  NOVOLOG FLEXPEN 100 UNIT/ML FlexPen Inject 24 Units into the skin 2 (two) times daily.  07/01/13   Historical Provider, MD  omeprazole (PRILOSEC) 20 MG capsule Take 20 mg by mouth as needed.     Historical Provider, MD  Pramlintide Acetate (SYMLINPEN 120) 2700 MCG/2.7ML SOLN Inject 60 Units into the skin 2 (two) times daily before a meal.      Historical Provider, MD  QUEtiapine (SEROQUEL) 400 MG tablet 300 mg as directed.  04/14/12   Historical Provider, MD  simvastatin (ZOCOR) 40 MG tablet Take 20 mg by mouth at bedtime.      Historical Provider, MD  ticagrelor (BRILINTA) 90 MG TABS tablet Take 1 tablet (90 mg total) by mouth 2 (two) times daily. 07/12/14   Dayna N  Dunn, PA-C   BP 127/73 mmHg  Pulse 92  Temp(Src) 97.2 F (36.2 C) (Oral)  Resp 16  Ht 6' 4" (1.93 m)  Wt 280 lb (127.007 kg)  BMI 34.10 kg/m2  SpO2 95% Physical Exam  Constitutional: He is oriented to person, place, and time. He appears well-developed and well-nourished.  Non-toxic appearance. No distress.  HENT:  Head: Normocephalic and atraumatic.  Eyes: Conjunctivae, EOM and lids are normal. Pupils are equal, round, and reactive to light.  Neck: Normal range of motion. Neck supple. No tracheal deviation present.  No thyroid mass present.  Cardiovascular: Normal rate, regular rhythm and normal heart sounds.  Exam reveals no gallop.   No murmur heard. Pulmonary/Chest: Effort normal and breath sounds normal. No stridor. No respiratory distress. He has no decreased breath sounds. He has no wheezes. He has no rhonchi. He has no rales.  Abdominal: Soft. Normal appearance and bowel sounds are normal. He exhibits no distension. There is no tenderness. There is no rebound and no CVA tenderness.  Musculoskeletal: Normal range of motion. He exhibits no edema or tenderness.  Neurological: He is alert and oriented to person, place, and time. He has normal strength. No cranial nerve deficit or sensory deficit. GCS eye subscore is 4. GCS verbal subscore is 5. GCS motor subscore is 6.  Skin: Skin is warm and dry. No abrasion and no rash noted.  Psychiatric: He has a normal mood and affect. His speech is normal and behavior is normal.  Nursing note and vitals reviewed.   ED Course  Procedures (including critical care time) Labs Review Labs Reviewed  CBC WITH DIFFERENTIAL/PLATELET - Abnormal; Notable for the following:    Platelets 149 (*)    All other components within normal limits  COMPREHENSIVE METABOLIC PANEL - Abnormal; Notable for the following:    Glucose, Bld 296 (*)    BUN 32 (*)    Creatinine, Ser 2.10 (*)    GFR calc non Af Amer 30 (*)    GFR calc Af Amer 35 (*)    All other  components within normal limits  LIPASE, BLOOD  URINALYSIS, ROUTINE W REFLEX MICROSCOPIC    Imaging Review No results found.   EKG Interpretation None      MDM   Final diagnoses:  None    Patient is dehydrated here and was given IV fluids and will be admitted to the medicine service    Leota Jacobsen, MD 08/01/14 1400

## 2014-08-01 NOTE — ED Notes (Signed)
Check patient cbg it was 281

## 2014-08-01 NOTE — H&P (Signed)
[facility] History and Physical  Tyler Le ELF:[id number] DOB: [birth year] DOA: 08/01/2014  Referring physician: Dr Tyler Le PCP: Tyler Downing, MD   Chief Complaint: diarrhea.   HPI: Tyler Le is a 69 y.o. male with PMH CAD/Unstable angina, S/P stenting/PTCA of ostial SVG ramus/OM last admission 07-11-2014, history of CABG times 4, dyslipidemia, HTN, Dibetes who presents complaining of nausea vomiting and diarrhea that started 3 days prior to admission. Her report more than 10 episodes of vomiting and watery diarrhea. He relates mild abdominal pain. He denies chest pain, dyspnea. He has sick contact, a friend with similar symptoms. He was able to keep down his medications for his heart yesterday. Relates some chill, denies fever.     Review of Systems:  Negative,except as per HPI.   Past Medical History  Diagnosis Date  . Essential hypertension   . Dyslipidemia   . Coronary artery disease     a. 2001: s/p CABG X4;  b. 08/2010 s/p PCI/BMS to VG->RI->OM;  c. 06/2014 Cath/PCI: LM nl, LAD 128m LCX 737mRI 100, OM1 100, OM2 80, small, RCA 100ost, VG->RCA 100, VG->Diag 100, VG->RI->OM1 70-80 ISR (4.0x24 Promus DES), LIMA->LAD nl.  . Morbid obesity   . Dysrhythmia   . History of blood transfusion 2001    "related to OHS"  . Anemia   . GERD (gastroesophageal reflux disease)   . Depression   . Anxiety   . Nephrolithiasis   . Complication of anesthesia     "they have a hard time waking him up"  . Type II diabetes mellitus   . Arthritis     "back, neck" (07/11/2014)  . Chronic lower back pain   . Thrombocytopenia     a. Borderline low platelets by labs noted in 06/2014 but also noted on prior labs as well.  . Renal insufficiency     a. noted 06/2014 - baseline unclear as no data since 2012.  . Marland KitchenBBB    Past Surgical History  Procedure Laterality Date  . Cardiac catheterization  2001; ~ 2010  . Coronary artery bypass graft  2001    "CABG X 5"  . Appendectomy  ~ 1955   . Lumbar laminectomy  01/2011  . Back surgery    . Cholecystectomy    . Shoulder arthroscopy w/ rotator cuff repair Left 1990's  . Cardiovascular stress test  07/03/2009    EF 64%  . Left heart catheterization with coronary/graft angiogram N/A 05/09/2011    Procedure: LEFT HEART CATHETERIZATION WITH COBeatrix Fetters Surgeon: ChBurnell BlanksMD;  [facility];  Service: Cardiovascular;  Laterality: N/A;  . Tonsillectomy and adenoidectomy  1951  . Coronary angioplasty with stent placement  March 2012    BMS to SVG to intermediate/OM  . Coronary angioplasty with stent placement  07/11/2014    "1"  . Left heart catheterization with coronary angiogram N/A 07/11/2014    Procedure: LEFT HEART CATHETERIZATION WITH CORONARY ANGIOGRAM;  Surgeon: Tyler M JoMartiniqueMD;  [facility];  Service: Cardiovascular;  Laterality: N/A;   Social History:  reports that he has quit smoking. His smoking use included Cigarettes. He has a 100 pack-year smoking history. He has quit using smokeless tobacco. His smokeless tobacco use included Chew. He reports that he drinks alcohol. He reports that he does not use illicit drugs.  Allergies  Allergen Reactions  . Metformin And Related Nausea And Vomiting  . Pravachol Nausea And Vomiting  . Ramipril Nausea Only  .  Wellbutrin [Bupropion Hcl] Nausea And Vomiting    Family History  Problem Relation Age of Onset  . Heart disease Mother   . Heart disease Father   . Heart attack Father   . Heart disease Brother     Prior to Admission medications   Medication Sig Start Date End Date Taking? Authorizing Provider  amLODipine (NORVASC) 10 MG tablet Take 1 tablet (10 mg total) by mouth daily. 05/10/11  Yes Tyler N Dunn, PA-C  Blood Glucose Monitoring Suppl (FREESTYLE FREEDOM) KIT  03/27/12  Yes Historical Provider, MD  busPIRone (BUSPAR) 15 MG tablet Take 15 mg by mouth daily.  03/14/12  Yes Historical Provider, MD  carvedilol (COREG) 25 MG  tablet Take 0.5 tablets (12.5 mg total) by mouth 2 (two) times daily with a meal. 07/12/14  Yes Tyler N Dunn, PA-C  Cholecalciferol (VITAMIN D3) 2000 UNITS TABS Take 2 tablets by mouth daily.     Yes Historical Provider, MD  CYMBALTA 60 MG capsule Take 60 mg by mouth daily.  03/30/12  Yes Historical Provider, MD  HYDROcodone-acetaminophen (NORCO) 10-325 MG per tablet Take 1-2 tablets by mouth every 6 (six) hours as needed. For pain   Yes Historical Provider, MD  insulin aspart protamine- aspart (NOVOLOG MIX 70/30) (70-30) 100 UNIT/ML injection Inject 40-50 Units into the skin 2 (two) times daily with a meal. Inject 50 units in the morning and 40 units in the evening.   Yes Historical Provider, MD  insulin glargine (LANTUS) 100 UNIT/ML injection Inject 64 Units into the skin at bedtime.    Yes Historical Provider, MD  Linaclotide Rolan Lipa) 145 MCG CAPS capsule Take 145 mcg by mouth daily as needed.   Yes Historical Provider, MD  lisinopril (PRINIVIL,ZESTRIL) 5 MG tablet Take 2.5 mg by mouth daily.   Yes Historical Provider, MD  mirtazapine (REMERON) 15 MG tablet Take 15 mg by mouth at bedtime.  03/30/12  Yes Historical Provider, MD  Multiple Vitamin (MULTIVITAMIN) tablet Take 1 tablet by mouth daily.    Yes Historical Provider, MD  nitroGLYCERIN (NITROSTAT) 0.4 MG SL tablet Place 1 tablet (0.4 mg total) under the tongue every 5 (five) minutes as needed for chest pain (up to 3 doses). 06/14/14 10/12/15 Yes Darlin Coco, MD  NOVOLOG FLEXPEN 100 UNIT/ML FlexPen Inject 24 Units into the skin 2 (two) times daily.  07/01/13  Yes Historical Provider, MD  omeprazole (PRILOSEC) 20 MG capsule Take 20 mg by mouth as needed.    Yes Historical Provider, MD  Pramlintide Acetate (SYMLINPEN 120) 2700 MCG/2.7ML SOLN Inject 60 Units into the skin 2 (two) times daily before a meal.     Yes Historical Provider, MD  QUEtiapine (SEROQUEL) 400 MG tablet 300 mg as directed.  04/14/12  Yes Historical Provider, MD  simvastatin  (ZOCOR) 40 MG tablet Take 20 mg by mouth at bedtime.     Yes Historical Provider, MD  ticagrelor (BRILINTA) 90 MG TABS tablet Take 1 tablet (90 mg total) by mouth 2 (two) times daily. 07/12/14  Yes Charlie Pitter, PA-C   Physical Exam: Filed Vitals:   08/01/14 1328 08/01/14 1345 08/01/14 1415 08/01/14 1445  BP: 127/73 118/69 127/89 134/66  Pulse: 92 93 95 92  Temp:      TempSrc:      Resp: _0 Height:      Weight:      SpO2: 95% 93% 92% 94%    Wt Readings from Last 3 Encounters:  08/01/14 127.007 kg (  280 lb)  07/27/14 131.09 kg (289 lb)  07/12/14 135.5 kg (298 lb 11.6 oz)    General:  Appears calm and comfortable Eyes: PERRL, normal lids, irises & conjunctiva ENT: grossly normal hearing, lips & tongue Neck: no LAD, masses or thyromegaly Cardiovascular: RRR, no m/r/g. No LE edema. Telemetry: SR, no arrhythmias  Respiratory: CTA bilaterally, no w/r/r. Normal respiratory effort. Abdomen: soft, nt, obese.  Skin: no rash or induration seen on limited exam Musculoskeletal: grossly normal tone BUE/BLE Psychiatric: grossly normal mood and affect, speech fluent and appropriate Neurologic: grossly non-focal.          Labs on Admission:  Basic Metabolic Panel:  Recent Labs Lab 08/01/14 1030  NA 136  K 4.5  CL 101  CO2 25  GLUCOSE 296*  BUN 32*  CREATININE 2.10*  CALCIUM 9.2   Liver Function Tests:  Recent Labs Lab 08/01/14 1030  AST 34  ALT 35  ALKPHOS 81  BILITOT 0.9  PROT 7.5  ALBUMIN 3.8    Recent Labs Lab 08/01/14 1030  LIPASE 22   No results for input(s): AMMONIA in the last 168 hours. CBC:  Recent Labs Lab 08/01/14 1030  WBC 7.6  NEUTROABS 5.1  HGB 14.9  HCT 43.8  MCV 90.3  PLT 149*   Cardiac Enzymes: No results for input(s): CKTOTAL, CKMB, CKMBINDEX, TROPONINI in the last 168 hours.  BNP (last 3 results) No results for input(s): BNP in the last 8760 hours.  ProBNP (last 3 results) No results for input(s): PROBNP in the last  8760 hours.  CBG: No results for input(s): GLUCAP in the last 168 hours.  Radiological Exams on Admission: No results found.  EKG: none available.   Assessment/Plan Active Problems:   CAD (coronary artery disease)   AKI (acute kidney injury)   Renal failure   Gastroenteritis, acute   Diabetes mellitus type 2 in obese   1-AKI; Likely in setting of decrease volume. Suspect will improved with hydration.  -hold ACE.  -IV fluids.   2-Nausea, vomiting, diarrhea; likely Gastroenteritis. Due to recent admission will check C diff. IV fluids. Support care. GI pathogen.   3-Diabetes; Decrease home dose Lantus to avoid hypoglycemia. SSI.   4-CAD, recent stent 1/19; continue with coreg, brilinta, statins. He denies chest pain or dyspnea.  5-Chronic thrombocytopenia; stable.   Code Status: full code.  DVT Prophylaxis: heparin Family Communication: Care discussed with patient.  Disposition Plan: expect 2 to 3 days.    Time spent: 75 minutes.   Niel Hummer A [facility] Pager [phone]

## 2014-08-01 NOTE — ED Notes (Signed)
Pt c/o N/V/D onset Friday. Pt seen at urgent care in [place] and sent here for further eval.

## 2014-08-01 NOTE — ED Notes (Signed)
MD made aware about patient's

## 2014-08-02 DIAGNOSIS — E119 Type 2 diabetes mellitus without complications: Secondary | ICD-10-CM

## 2014-08-02 DIAGNOSIS — I25709 Atherosclerosis of coronary artery bypass graft(s), unspecified, with unspecified angina pectoris: Secondary | ICD-10-CM

## 2014-08-02 DIAGNOSIS — E669 Obesity, unspecified: Secondary | ICD-10-CM

## 2014-08-02 DIAGNOSIS — N179 Acute kidney failure, unspecified: Secondary | ICD-10-CM

## 2014-08-02 LAB — CLOSTRIDIUM DIFFICILE BY PCR: Toxigenic C. Difficile by PCR: NEGATIVE

## 2014-08-02 LAB — CBC
HCT: 38 % — ABNORMAL LOW (ref 39.0–52.0)
HEMOGLOBIN: 13 g/dL (ref 13.0–17.0)
MCH: 31 pg (ref 26.0–34.0)
MCHC: 34.2 g/dL (ref 30.0–36.0)
MCV: 90.5 fL (ref 78.0–100.0)
PLATELETS: 119 10*3/uL — AB (ref 150–400)
RBC: 4.2 MIL/uL — ABNORMAL LOW (ref 4.22–5.81)
RDW: 13.2 % (ref 11.5–15.5)
WBC: 4.7 10*3/uL (ref 4.0–10.5)

## 2014-08-02 LAB — GLUCOSE, CAPILLARY
GLUCOSE-CAPILLARY: 281 mg/dL — AB (ref 70–99)
GLUCOSE-CAPILLARY: 189 mg/dL — AB (ref 70–99)
GLUCOSE-CAPILLARY: 232 mg/dL — AB (ref 70–99)
Glucose-Capillary: 279 mg/dL — ABNORMAL HIGH (ref 70–99)
Glucose-Capillary: 311 mg/dL — ABNORMAL HIGH (ref 70–99)
Glucose-Capillary: 272 mg/dL — ABNORMAL HIGH (ref 70–99)

## 2014-08-02 LAB — HEMOGLOBIN A1C
Hgb A1c MFr Bld: 9.1 % — ABNORMAL HIGH (ref 4.8–5.6)
Mean Plasma Glucose: 214 mg/dL

## 2014-08-02 LAB — BASIC METABOLIC PANEL
Anion gap: 4 — ABNORMAL LOW (ref 5–15)
BUN: 28 mg/dL — AB (ref 6–23)
CHLORIDE: 104 mmol/L (ref 96–112)
CO2: 29 mmol/L (ref 19–32)
CREATININE: 1.61 mg/dL — AB (ref 0.50–1.35)
Calcium: 8.2 mg/dL — ABNORMAL LOW (ref 8.4–10.5)
GFR calc Af Amer: 48 mL/min — ABNORMAL LOW (ref >90)
GFR calc non Af Amer: 42 mL/min — ABNORMAL LOW (ref >90)
Glucose, Bld: 236 mg/dL — ABNORMAL HIGH (ref 70–99)
Potassium: 4.3 mmol/L (ref 3.5–5.1)
Sodium: 137 mmol/L (ref 135–145)

## 2014-08-02 MED ORDER — METRONIDAZOLE IN NACL 5-0.79 MG/ML-% IV SOLN
500.0000 mg | Freq: Three times a day (TID) | INTRAVENOUS | Status: DC
Start: 2014-08-02 — End: 2014-08-03
  Administered 2014-08-02 – 2014-08-03 (×2): 500 mg via INTRAVENOUS
  Filled 2014-08-02 (×5): qty 100

## 2014-08-02 MED ORDER — IOHEXOL 300 MG/ML  SOLN
25.0000 mL | INTRAMUSCULAR | Status: AC
  Administered 2014-08-02 (×2): 25 mL via ORAL

## 2014-08-02 MED ORDER — INSULIN GLARGINE 100 UNIT/ML ~~LOC~~ SOLN
45.0000 [IU] | Freq: Every day | SUBCUTANEOUS | Status: DC
  Administered 2014-08-02: 45 [IU] via SUBCUTANEOUS
  Filled 2014-08-02 (×2): qty 0.45

## 2014-08-02 MED ORDER — INSULIN ASPART 100 UNIT/ML ~~LOC~~ SOLN: 0.0000 [IU] | Freq: Three times a day (TID) | SUBCUTANEOUS | Status: DC

## 2014-08-02 MED ORDER — INSULIN ASPART 100 UNIT/ML ~~LOC~~ SOLN
0.0000 [IU] | Freq: Three times a day (TID) | SUBCUTANEOUS | Status: DC
  Administered 2014-08-02: 4 [IU] via SUBCUTANEOUS
  Administered 2014-08-03: 7 [IU] via SUBCUTANEOUS
  Administered 2014-08-03: 5 [IU] via SUBCUTANEOUS

## 2014-08-02 MED ORDER — INSULIN ASPART 100 UNIT/ML ~~LOC~~ SOLN
0.0000 [IU] | Freq: Every day | SUBCUTANEOUS | Status: DC
  Administered 2014-08-02: 2 [IU] via SUBCUTANEOUS

## 2014-08-02 NOTE — Progress Notes (Signed)
Patient ID: Tyler Le  male  ZOX:[id number]    DOB: Nov 13, 1945    DOA: 08/01/2014  PCP: Kaleen Mask, MD  Brief history of present illness Tyler Le is a 69 y.o. male with PMH CAD/Unstable angina, S/P stenting/PTCA of ostial SVG ramus/OM last admission 07-11-2014, history of CABG times 4, dyslipidemia, HTN, Dibetes who presented complaining of nausea vomiting and diarrhea that started 3 days prior to admission. He reported more than 10 episodes of vomiting and watery diarrhea with mild abdominal pain. He denied chest pain, dyspnea. He has sick contact, a friend with similar symptoms. He was able to keep down his medications.  Assessment/Plan: Principal Problem:   Gastroenteritis, acute: Likely viral - Improving per patient, no nausea or vomiting, diarrhea this morning, advance diet to solids. -  C. difficile negative, GI pathogen panel pending  - Continue IV fluid hydration, hold stool softeners/linzess  Active Problems: Acute kidney injury /dehydration: Baseline creatinine 1.3, improving, UA at the time of admission showed ketones - Hold lisinopril, continue IV fluid hydration     CAD (coronary artery disease) -Currently stable, continue Coreg, simvastatin, brilinta      Diabetes mellitus type 2 in obese - uncontrolled. Increase Levemir to 45 units, placed on resistance sliding scale  - Hemoglobin A1c 9.1  DVT Prophylaxis: heparin sq  Code Status: full code   Family Communication: updated patient's fiance, [name] on phone  Disposition: hopefully DC home tomorrow if stable   Consultants:  none  Procedures:  none  Antibiotics:  none    Subjective:  Patient seen and examined, feels a whole lot better today, no diarrhea  Objective : Weight change:   Intake/Output Summary (Last 24 hours) at 08/02/14 1258 Last data filed at 08/02/14 0900  Gross per 24 hour  Intake 1966.67 ml  Output    650 ml  Net 1316.67 ml   Blood pressure 153/64, pulse 96,  temperature 97.6 F (36.4 C), temperature source Oral, resp. rate 17, height  (1.93 m), weight 128.504 kg (283 lb 4.8 oz), SpO2 92 %.  Physical Exam: General: Alert and awake, oriented x3, not in any acute distress. CVS: S1-S2 clear, no murmur rubs or gallops Chest: clear to auscultation bilaterally, no wheezing, rales or rhonchi Abdomen: soft nontender, nondistended, normal bowel sounds  Extremities: no cyanosis, clubbing or edema noted bilaterally Neuro: Cranial nerves II-XII intact, no focal neurological deficits  Lab Results: Basic Metabolic Panel:  Recent Labs Lab 08/01/14 1030 08/02/14 0507  NA 136 137  K 4.5 4.3  CL 101 104  CO2 25 29  GLUCOSE 296* 236*  BUN 32* 28*  CREATININE 2.10* 1.61*  CALCIUM 9.2 8.2*   Liver Function Tests:  Recent Labs Lab 08/01/14 1030  AST 34  ALT 35  ALKPHOS 81  BILITOT 0.9  PROT 7.5  ALBUMIN 3.8    Recent Labs Lab 08/01/14 1030  LIPASE 22   No results for input(s): AMMONIA in the last 168 hours. CBC:  Recent Labs Lab 08/01/14 1030 08/02/14 0507  WBC 7.6 4.7  NEUTROABS 5.1  --   HGB 14.9 13.0  HCT 43.8 38.0*  MCV 90.3 90.5  PLT 149* 119*   Cardiac Enzymes: No results for input(s): CKTOTAL, CKMB, CKMBINDEX, TROPONINI in the last 168 hours. BNP: Invalid input(s): POCBNP CBG:  Recent Labs Lab 08/02/14 1145  GLUCAP 311*     Micro Results: Recent Results (from the past 240 hour(s))  Clostridium Difficile by PCR     Status:  None   Collection Time: 08/01/14  6:38 PM  Result Value Ref Range Status   C difficile by pcr NEGATIVE NEGATIVE Final    Studies/Results: No results found.  Medications: Scheduled Meds: . busPIRone  15 mg Oral Daily  . carvedilol  12.5 mg Oral BID WC  . cholecalciferol  2,000 Units Oral Daily  . DULoxetine  60 mg Oral Daily  . heparin  5,000 Units Subcutaneous 3 times per day  . insulin aspart  0-20 Units Subcutaneous TID WC  . insulin aspart  0-5 Units Subcutaneous QHS  .  insulin glargine  45 Units Subcutaneous QHS  . mirtazapine  15 mg Oral QHS  . multivitamin with minerals  1 tablet Oral Daily  . QUEtiapine  300 mg Oral QHS  . simvastatin  20 mg Oral QHS  . ticagrelor  90 mg Oral BID   time spent 25 minutes    LOS: 1 day   RAI,RIPUDEEP M.D. [facility] 08/02/2014, 12:58 PM Pager: [phone]  If 7PM-7AM, please contact night-coverage [email or web address] Password [email or web address]

## 2014-08-02 NOTE — Progress Notes (Addendum)
Inpatient Diabetes Program Recommendations  AACE/ADA: New Consensus Statement on Inpatient Glycemic Control (2013)  Target Ranges:  Prepandial:   less than 140 mg/dL      Peak postprandial:   less than 180 mg/dL (1-2 hours)      Critically ill patients:  140 - 180 mg/dL     Diabetes history: DM2 Outpatient Diabetes medications: 70/30 50 units QAM, 70/30 40 units QPM, Lantus 64 units QHS, Novolog 24 units BID with meals Current orders for Inpatient glycemic control: Lantus 40 units QHS, Novolog 0-9 units TID with meals  Inpatient Diabetes Program Recommendations Insulin - Basal: Please increase Lantus to 45 units QHS. Correction (SSI): Please increase Novolog correction to resistant scale and add Novolog bedtime correction scale. Insulin - Meal Coverage: Once patient is tolerating diet, may need to consider ordering meal coverage.   08/02/14@14 :05-Spoke with patient and  about diabetes and home regimen for diabetes control. Patient reports that he is [facility]. Currently he takes Lantus 64 units QHS, Novolog 24 units with breakfast and supper, Symlin 120 units QAM with breakfast as an outpatient for diabetes control. Inquired about 70/30 as listed on home medication list and patient states that he does NOT take 70/30 and confirms that he takes Lantus, Novolog, and Symlin. Patient reports that he saw his Endocrinologist at the [facility] about 3 weeks ago and the only medication change was they instructed him to take Novolog 12 units with lunch but he has not started this because he does not usually eat lunch. Patient states that he checks his glucose "maybe once a day". Inquired about knowledge about A1C and patient reports that he knows what an A1C is and it was in the 8% range 3 weeks ago. Discussed A1C results (9.1% on 08/01/14) and reviewed basic pathophysiology of DM Type 2, basic home care, importance of checking CBGs and maintaining good CBG  control to prevent long-term and short-term complications. Discussed impact of nutrition, exercise, stress, sickness, and medications on diabetes control.  Patient states that he does not always follow a diabetic diet and he eats potatoes and bread almost daily.  Discussed carbohydrates, carbohydrate goals per day and meal, along with portion sizes. Encouraged patient to watch amount of carbohydrates consumed and to check his glucose 3-4 times per day. Asked patient to keep a log of his glucose readings and DM medications and to take the information with him when he follows up with his doctor. Patient's wife states that she will remind him to check his glucose before meals and at bedtime. Patient verbalized understanding of information discussed and he states that he has no further questions at this time related to diabetes.   Thanks, Orlando PennerMarie Ashland Wiseman, RN, MSN, CCRN, CDE Diabetes Coordinator Inpatient Diabetes Program [phone] (Team Pager) [phone] ([facility] office) [phone] [facility] office)

## 2014-08-02 NOTE — Progress Notes (Signed)
Pt c/o bloating, diarrhea x 3 after eating solid lunch. Abdomen distended. MD notified. Orders received.

## 2014-08-03 ENCOUNTER — Inpatient Hospital Stay (HOSPITAL_COMMUNITY): Payer: Medicare Other

## 2014-08-03 ENCOUNTER — Encounter (HOSPITAL_COMMUNITY): Payer: Self-pay

## 2014-08-03 LAB — GLUCOSE, CAPILLARY
GLUCOSE-CAPILLARY: 235 mg/dL — AB (ref 70–99)
Glucose-Capillary: 300 mg/dL — ABNORMAL HIGH (ref 70–99)
Glucose-Capillary: 306 mg/dL — ABNORMAL HIGH (ref 70–99)

## 2014-08-03 LAB — BASIC METABOLIC PANEL
Anion gap: 9 (ref 5–15)
BUN: 17 mg/dL (ref 6–23)
CALCIUM: 8.1 mg/dL — AB (ref 8.4–10.5)
CO2: 21 mmol/L (ref 19–32)
CREATININE: 1.34 mg/dL (ref 0.50–1.35)
Chloride: 106 mmol/L (ref 96–112)
GFR calc Af Amer: 60 mL/min — ABNORMAL LOW (ref >90)
GFR calc non Af Amer: 52 mL/min — ABNORMAL LOW (ref >90)
Glucose, Bld: 255 mg/dL — ABNORMAL HIGH (ref 70–99)
Potassium: 3.7 mmol/L (ref 3.5–5.1)
Sodium: 136 mmol/L (ref 135–145)

## 2014-08-03 LAB — HEMOGLOBIN A1C
Hgb A1c MFr Bld: 9 % — ABNORMAL HIGH (ref 4.8–5.6)
MEAN PLASMA GLUCOSE: 212 mg/dL

## 2014-08-03 MED ORDER — INSULIN GLARGINE 100 UNIT/ML ~~LOC~~ SOLN
60.0000 [IU] | Freq: Every day | SUBCUTANEOUS | Status: DC
  Filled 2014-08-03: qty 0.6

## 2014-08-03 MED ORDER — LOPERAMIDE HCL 2 MG PO CAPS: 2.0000 mg | ORAL_CAPSULE | ORAL | Status: DC | PRN

## 2014-08-03 MED ORDER — METRONIDAZOLE 500 MG PO TABS: 500.0000 mg | ORAL_TABLET | Freq: Three times a day (TID) | ORAL | Status: DC

## 2014-08-03 MED ORDER — PROMETHAZINE HCL 12.5 MG PO TABS
12.5000 mg | ORAL_TABLET | Freq: Four times a day (QID) | ORAL | Status: DC | PRN
Start: 2014-08-03 — End: 2015-10-12

## 2014-08-03 MED ORDER — METRONIDAZOLE 500 MG PO TABS
ORAL_TABLET | ORAL | Status: AC
  Administered 2014-08-03: 500 mg
  Filled 2014-08-03: qty 1

## 2014-08-03 NOTE — Progress Notes (Signed)
Inpatient Diabetes Program Recommendations  AACE/ADA: New Consensus Statement on Inpatient Glycemic Control (2013)  Target Ranges:  Prepandial:   less than 140 mg/dL      Peak postprandial:   less than 180 mg/dL (1-2 hours)      Critically ill patients:  140 - 180 mg/dL   Increase Lantus to 64 units Increase Novolog to Resistant scale  Thank you  Piedad ClimesGina Janeese Mcgloin BSN, RN,CDE Inpatient Diabetes Coordinator [phone] (team pager)

## 2014-08-03 NOTE — Discharge Summary (Signed)
Physician Discharge Summary  Patient ID: Tyler Le MRN: [id number] DOB/AGE: 03/18/1946 69 y.o.  Admit date: 08/01/2014 Discharge date: 08/03/2014  Primary Care Physician:  Leonard Downing, MD  Discharge Diagnoses:    . AKI (acute kidney injury) . CAD (coronary artery disease) . Gastroenteritis, acute viral Acute kidney injury Dehydration  Consults:  none   Recommendations for Outpatient Follow-up:  Patient did have uncontrolled CBG readings while inpatient, please follow blood sugars closely and adjust insulin  Lisinopril is currently on hold, creatinine has improved to 1.3, please check a BMET and restart lisinopril if back to baseline creatinine.  TESTS THAT NEED FOLLOW-UP GI pathogen panel   DIET: Soft diet    Allergies:   Allergies  Allergen Reactions  . Metformin And Related Nausea And Vomiting  . Pravachol Nausea And Vomiting  . Ramipril Nausea Only  . Wellbutrin [Bupropion Hcl] Nausea And Vomiting     Discharge Medications:   Medication List    STOP taking these medications        Linaclotide 145 MCG Caps capsule  Commonly known as:  LINZESS     lisinopril 5 MG tablet  Commonly known as:  PRINIVIL,ZESTRIL      TAKE these medications        amLODipine 10 MG tablet  Commonly known as:  NORVASC  Take 1 tablet (10 mg total) by mouth daily.     busPIRone 15 MG tablet  Commonly known as:  BUSPAR  Take 15 mg by mouth daily.     carvedilol 25 MG tablet  Commonly known as:  COREG  Take 0.5 tablets (12.5 mg total) by mouth 2 (two) times daily with a meal.     CYMBALTA 60 MG capsule  Generic drug:  DULoxetine  Take 60 mg by mouth daily.     FREESTYLE FREEDOM Kit     HYDROcodone-acetaminophen 10-325 MG per tablet  Commonly known as:  NORCO  Take 1-2 tablets by mouth every 6 (six) hours as needed. For pain     insulin aspart protamine- aspart (70-30) 100 UNIT/ML injection  Commonly known as:  NOVOLOG MIX 70/30  Inject 40-50 Units  into the skin 2 (two) times daily with a meal. Inject 50 units in the morning and 40 units in the evening.     insulin glargine 100 UNIT/ML injection  Commonly known as:  LANTUS  Inject 64 Units into the skin at bedtime.     loperamide 2 MG capsule  Commonly known as:  IMODIUM  Take 1 capsule (2 mg total) by mouth as needed for diarrhea or loose stools (max upto 34m/day).     metroNIDAZOLE 500 MG tablet  Commonly known as:  FLAGYL  Take 1 tablet (500 mg total) by mouth 3 (three) times daily. X 1 week     mirtazapine 15 MG tablet  Commonly known as:  REMERON  Take 15 mg by mouth at bedtime.     multivitamin tablet  Take 1 tablet by mouth daily.     nitroGLYCERIN 0.4 MG SL tablet  Commonly known as:  NITROSTAT  Place 1 tablet (0.4 mg total) under the tongue every 5 (five) minutes as needed for chest pain (up to 3 doses).     NOVOLOG FLEXPEN 100 UNIT/ML FlexPen  Generic drug:  insulin aspart  Inject 24 Units into the skin 2 (two) times daily.     omeprazole 20 MG capsule  Commonly known as:  PRILOSEC  Take 20 mg by mouth as needed.  Physician Discharge Summary  Patient ID: Tyler Le MRN: [id number] DOB/AGE: 03/18/1946 69 y.o.  Admit date: 08/01/2014 Discharge date: 08/03/2014  Primary Care Physician:  Leonard Downing, MD  Discharge Diagnoses:    . AKI (acute kidney injury) . CAD (coronary artery disease) . Gastroenteritis, acute viral Acute kidney injury Dehydration  Consults:  none   Recommendations for Outpatient Follow-up:  Patient did have uncontrolled CBG readings while inpatient, please follow blood sugars closely and adjust insulin  Lisinopril is currently on hold, creatinine has improved to 1.3, please check a BMET and restart lisinopril if back to baseline creatinine.  TESTS THAT NEED FOLLOW-UP GI pathogen panel   DIET: Soft diet    Allergies:   Allergies  Allergen Reactions  . Metformin And Related Nausea And Vomiting  . Pravachol Nausea And Vomiting  . Ramipril Nausea Only  . Wellbutrin [Bupropion Hcl] Nausea And Vomiting     Discharge Medications:   Medication List    STOP taking these medications        Linaclotide 145 MCG Caps capsule  Commonly known as:  LINZESS     lisinopril 5 MG tablet  Commonly known as:  PRINIVIL,ZESTRIL      TAKE these medications        amLODipine 10 MG tablet  Commonly known as:  NORVASC  Take 1 tablet (10 mg total) by mouth daily.     busPIRone 15 MG tablet  Commonly known as:  BUSPAR  Take 15 mg by mouth daily.     carvedilol 25 MG tablet  Commonly known as:  COREG  Take 0.5 tablets (12.5 mg total) by mouth 2 (two) times daily with a meal.     CYMBALTA 60 MG capsule  Generic drug:  DULoxetine  Take 60 mg by mouth daily.     FREESTYLE FREEDOM Kit     HYDROcodone-acetaminophen 10-325 MG per tablet  Commonly known as:  NORCO  Take 1-2 tablets by mouth every 6 (six) hours as needed. For pain     insulin aspart protamine- aspart (70-30) 100 UNIT/ML injection  Commonly known as:  NOVOLOG MIX 70/30  Inject 40-50 Units  into the skin 2 (two) times daily with a meal. Inject 50 units in the morning and 40 units in the evening.     insulin glargine 100 UNIT/ML injection  Commonly known as:  LANTUS  Inject 64 Units into the skin at bedtime.     loperamide 2 MG capsule  Commonly known as:  IMODIUM  Take 1 capsule (2 mg total) by mouth as needed for diarrhea or loose stools (max upto 34m/day).     metroNIDAZOLE 500 MG tablet  Commonly known as:  FLAGYL  Take 1 tablet (500 mg total) by mouth 3 (three) times daily. X 1 week     mirtazapine 15 MG tablet  Commonly known as:  REMERON  Take 15 mg by mouth at bedtime.     multivitamin tablet  Take 1 tablet by mouth daily.     nitroGLYCERIN 0.4 MG SL tablet  Commonly known as:  NITROSTAT  Place 1 tablet (0.4 mg total) under the tongue every 5 (five) minutes as needed for chest pain (up to 3 doses).     NOVOLOG FLEXPEN 100 UNIT/ML FlexPen  Generic drug:  insulin aspart  Inject 24 Units into the skin 2 (two) times daily.     omeprazole 20 MG capsule  Commonly known as:  PRILOSEC  Take 20 mg by mouth as needed.  Physician Discharge Summary  Patient ID: Tyler Le MRN: [id number] DOB/AGE: [birth year] 69 y.o.  Admit date: 08/01/2014 Discharge date: 08/03/2014  Primary Care Physician:  Leonard Downing, MD  Discharge Diagnoses:    . AKI (acute kidney injury) . CAD (coronary artery disease) . Gastroenteritis, acute viral Acute kidney injury Dehydration  Consults:  none   Recommendations for Outpatient Follow-up:  Patient did have uncontrolled CBG readings while inpatient, please follow blood sugars closely and adjust insulin  Lisinopril is currently on hold, creatinine has improved to 1.3, please check a BMET and restart lisinopril if back to baseline creatinine.  TESTS THAT NEED FOLLOW-UP GI pathogen panel   DIET: Soft diet    Allergies:   Allergies  Allergen Reactions  . Metformin And Related Nausea And Vomiting  . Pravachol Nausea And Vomiting  . Ramipril Nausea Only  . Wellbutrin [Bupropion Hcl] Nausea And Vomiting     Discharge Medications:   Medication List    STOP taking these medications        Linaclotide 145 MCG Caps capsule  Commonly known as:  LINZESS     lisinopril 5 MG tablet  Commonly known as:  PRINIVIL,ZESTRIL      TAKE these medications        amLODipine 10 MG tablet  Commonly known as:  NORVASC  Take 1 tablet (10 mg total) by mouth daily.     busPIRone 15 MG tablet  Commonly known as:  BUSPAR  Take 15 mg by mouth daily.     carvedilol 25 MG tablet  Commonly known as:  COREG  Take 0.5 tablets (12.5 mg total) by mouth 2 (two) times daily with a meal.     CYMBALTA 60 MG capsule  Generic drug:  DULoxetine  Take 60 mg by mouth daily.     FREESTYLE FREEDOM Kit     HYDROcodone-acetaminophen 10-325 MG per tablet  Commonly known as:  NORCO  Take 1-2 tablets by mouth every 6 (six) hours as needed. For pain     insulin aspart protamine- aspart (70-30) 100 UNIT/ML injection  Commonly known as:  NOVOLOG MIX 70/30  Inject 40-50 Units  into the skin 2 (two) times daily with a meal. Inject 50 units in the morning and 40 units in the evening.     insulin glargine 100 UNIT/ML injection  Commonly known as:  LANTUS  Inject 64 Units into the skin at bedtime.     loperamide 2 MG capsule  Commonly known as:  IMODIUM  Take 1 capsule (2 mg total) by mouth as needed for diarrhea or loose stools (max upto 54m/day).     metroNIDAZOLE 500 MG tablet  Commonly known as:  FLAGYL  Take 1 tablet (500 mg total) by mouth 3 (three) times daily. X 1 week     mirtazapine 15 MG tablet  Commonly known as:  REMERON  Take 15 mg by mouth at bedtime.     multivitamin tablet  Take 1 tablet by mouth daily.     nitroGLYCERIN 0.4 MG SL tablet  Commonly known as:  NITROSTAT  Place 1 tablet (0.4 mg total) under the tongue every 5 (five) minutes as needed for chest pain (up to 3 doses).     NOVOLOG FLEXPEN 100 UNIT/ML FlexPen  Generic drug:  insulin aspart  Inject 24 Units into the skin 2 (two) times daily.     omeprazole 20 MG capsule  Commonly known as:  PRILOSEC  Take 20 mg by mouth as needed.

## 2014-08-04 LAB — GI PATHOGEN PANEL BY PCR, STOOL
C difficile toxin A/B: NOT DETECTED
Campylobacter by PCR: NOT DETECTED
Cryptosporidium by PCR: NOT DETECTED
E COLI (ETEC) LT/ST: NOT DETECTED
E coli (STEC): NOT DETECTED
E coli 0157 by PCR: NOT DETECTED
G lamblia by PCR: NOT DETECTED
Norovirus GI/GII: NOT DETECTED
Rotavirus A by PCR: NOT DETECTED
SALMONELLA BY PCR: NOT DETECTED
SHIGELLA BY PCR: NOT DETECTED

## 2015-01-17 ENCOUNTER — Encounter: Payer: Self-pay | Admitting: Cardiology

## 2015-01-17 ENCOUNTER — Ambulatory Visit (INDEPENDENT_AMBULATORY_CARE_PROVIDER_SITE_OTHER): Payer: Medicare Other | Admitting: Cardiology

## 2015-01-17 VITALS — BP 98/62 | HR 84 | Ht 75.0 in | Wt 289.4 lb

## 2015-01-17 DIAGNOSIS — I259 Chronic ischemic heart disease, unspecified: Secondary | ICD-10-CM | POA: Diagnosis not present

## 2015-01-17 DIAGNOSIS — Z951 Presence of aortocoronary bypass graft: Secondary | ICD-10-CM | POA: Diagnosis not present

## 2015-01-17 DIAGNOSIS — I119 Hypertensive heart disease without heart failure: Secondary | ICD-10-CM | POA: Diagnosis not present

## 2015-01-17 MED ORDER — AMLODIPINE BESYLATE 5 MG PO TABS: 5.0000 mg | ORAL_TABLET | Freq: Every day | ORAL | Status: DC

## 2015-01-17 NOTE — Progress Notes (Signed)
Cardiology Office Note   Date:  01/17/2015   ID:  Tyler Le, DOB March 15, 1946, MRN [id number]  PCP:  Leonard Downing, MD  Cardiologist: Darlin Coco MD  No chief complaint on file.     History of Present Illness: Tyler Le is a 69 y.o. male who presents for scheduled follow-up office visit.  Tyler Le is a 69 y.o. male who presents for follow-up office visit  He has a history of known ischemic heart disease. He underwent coronary artery bypass graft surgery in 2001. In March 2012 he underwent bare-metal stent placement to the saphenous vein graft to the intermediate and obtuse marginal. He has been on aspirin 81. The patient was seen in December 2015 complaining of more frequent chest pain The patient had a Lexiscan Myoview stress test on 06/29/14 which showed Intermediate risk stress nuclear study demonstrating a mostly reversible defect along the mid to distal anterior wall/apical wall distribution. 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 Cardiac catheterization was recommended. On 07/11/14 patient underwent cardiac catheterization by Dr. Peter Martinique with the following findings: 1. Severe 3 vessel obstructive CAD 2. Patent LIMA to the LAD. 3. Restenosis in the stent in SVG to ramus/OM 4. Occluded SVG to diagonal 5. Occluded SVG to RCA 6. Successful stenting of the SVG to ramus/OM ostium with a DES.   Recommendations:  Continue DAPT indefinitely if possible. Needs to take at least one year.  Since last visit the patient has been doing reasonably well.  His blood pressure has been running low yesterday and today.  He has been feeling more fatigued.  He has not had any syncope.  He has occasional chest pain and takes sublingual nitroglycerin with improvement.  He continues to have problems with anxiety attacks, particularly at night when he lies down to go to sleep.  Past Medical History  Diagnosis Date  . Essential  hypertension   . Dyslipidemia   . Coronary artery disease     a. 2001: s/p CABG X4;  b. 08/2010 s/p PCI/BMS to VG->RI->OM;  c. 06/2014 Cath/PCI: LM nl, LAD 121m LCX 7100mRI 100, OM1 100, OM2 80, small, RCA 100ost, VG->RCA 100, VG->Diag 100, VG->RI->OM1 70-80 ISR (4.0x24 Promus DES), LIMA->LAD nl.  . Morbid obesity   . Dysrhythmia   . History of blood transfusion 2001    "related to OHS"  . Anemia   . GERD (gastroesophageal reflux disease)   . Depression   . Anxiety   . Nephrolithiasis   . Complication of anesthesia     "they have a hard time waking him up"  . Type II diabetes mellitus   . Arthritis     "back, neck" (07/11/2014)  . Chronic lower back pain   . Thrombocytopenia     a. Borderline low platelets by labs noted in 06/2014 but also noted on prior labs as well.  . Renal insufficiency     a. noted 06/2014 - baseline unclear as no data since 2012.  . Marland KitchenBBB     Past Surgical History  Procedure Laterality Date  . Cardiac catheterization  2001; ~ 2010  . Coronary artery bypass graft  2001    "CABG X 5"  . Appendectomy  ~ 1955  . Lumbar laminectomy  01/2011  . Back surgery    . Cholecystectomy    . Shoulder arthroscopy w/ rotator cuff repair Left 1990's  . Cardiovascular stress test  07/03/2009    EF 64%  .  Left heart catheterization with coronary/graft angiogram N/A 05/09/2011    Procedure: LEFT HEART CATHETERIZATION WITH Beatrix Fetters;  Surgeon: Burnell Blanks, MD;  Location: [facility] CATH LAB;  Service: Cardiovascular;  Laterality: N/A;  . Tonsillectomy and adenoidectomy  1951  . Left heart catheterization with coronary angiogram N/A 07/11/2014    Procedure: LEFT HEART CATHETERIZATION WITH CORONARY ANGIOGRAM;  Surgeon: Peter M Martinique, MD;  Location: [facility] CATH LAB;  Service: Cardiovascular;  Laterality: N/A;  . Coronary angioplasty with stent placement  March 2012    BMS to SVG to intermediate/OM  . Coronary angioplasty with stent placement  07/11/2014    "1"      Current Outpatient Prescriptions  Medication Sig Dispense Refill  . amLODipine (NORVASC) 5 MG tablet Take 1 tablet (5 mg total) by mouth daily. 90 tablet 3  . Blood Glucose Monitoring Suppl (FREESTYLE FREEDOM) KIT     . busPIRone (BUSPAR) 15 MG tablet Take 15 mg by mouth daily.     . carvedilol (COREG) 25 MG tablet Take 0.5 tablets (12.5 mg total) by mouth 2 (two) times daily with a meal. 60 tablet 6  . Cholecalciferol (VITAMIN D3) 2000 UNITS TABS Take 2 tablets by mouth daily.      . CYMBALTA 60 MG capsule Take 60 mg by mouth daily.     Marland Kitchen HYDROcodone-acetaminophen (NORCO) 10-325 MG per tablet Take 1-2 tablets by mouth every 6 (six) hours as needed. For pain    . insulin aspart protamine- aspart (NOVOLOG MIX 70/30) (70-30) 100 UNIT/ML injection Inject 40-50 Units into the skin 2 (two) times daily with a meal. Inject 50 units in the morning and 40 units in the evening.    . insulin glargine (LANTUS) 100 UNIT/ML injection Inject 64 Units into the skin at bedtime.     Marland Kitchen LINZESS 145 MCG CAPS capsule Take 145 mcg by mouth daily.    Marland Kitchen loperamide (IMODIUM) 2 MG capsule Take 1 capsule (2 mg total) by mouth as needed for diarrhea or loose stools (max upto 76m/day). 60 capsule 0  . mirtazapine (REMERON) 15 MG tablet Take 15 mg by mouth at bedtime.     . Multiple Vitamin (MULTIVITAMIN) tablet Take 1 tablet by mouth daily.     . nitroGLYCERIN (NITROSTAT) 0.4 MG SL tablet Place 1 tablet (0.4 mg total) under the tongue every 5 (five) minutes as needed for chest pain (up to 3 doses). 100 tablet 4  . NOVOLOG FLEXPEN 100 UNIT/ML FlexPen Inject 24 Units into the skin 2 (two) times daily.     .Marland Kitchenomeprazole (PRILOSEC) 20 MG capsule Take 20 mg by mouth as needed.     . promethazine (PHENERGAN) 12.5 MG tablet Take 1 tablet (12.5 mg total) by mouth every 6 (six) hours as needed for nausea or vomiting. 30 tablet 0  . QUEtiapine (SEROQUEL) 300 MG tablet Take 300 mg by mouth at bedtime.    . simvastatin (ZOCOR) 40  MG tablet Take 20 mg by mouth at bedtime.      . ticagrelor (BRILINTA) 90 MG TABS tablet Take 1 tablet (90 mg total) by mouth 2 (two) times daily. 60 tablet 11   No current facility-administered medications for this visit.    Allergies:   Metformin and related; Pravachol; Ramipril; and Wellbutrin    Social History:  The patient  reports that he has quit smoking. His smoking use included Cigarettes. He has a 100 pack-year smoking history. He has quit using smokeless tobacco. His smokeless tobacco use  included Chew. He reports that he drinks alcohol. He reports that he does not use illicit drugs.   Family History:  The patient's family history includes Heart attack in his father; Heart disease in his brother, father, and mother.    ROS:  Please see the history of present illness.   Otherwise, review of systems are positive for none.   All other systems are reviewed and negative.    PHYSICAL EXAM: VS:  BP 98/62 mmHg  Pulse 84  Ht 6' 3" (1.905 m)  Wt 289 lb 6.4 oz (131.271 kg)  BMI 36.17 kg/m2 , BMI Body mass index is 36.17 kg/(m^2). GEN: Well nourished, well developed, in no acute distress HEENT: normal Neck: no JVD, carotid bruits, or masses Cardiac: RRR; no murmurs, rubs, or gallops,no edema  Respiratory:  clear to auscultation bilaterally, normal work of breathing GI: soft, nontender, nondistended, + BS MS: no deformity or atrophy Skin: warm and dry, no rash Neuro:  Strength and sensation are intact Psych: euthymic mood, full affect   EKG:  EKG is ordered today. The ekg ordered today demonstrates normal sinus rhythm, right bundle branch block.  Since previous tracing of 07/12/14, no significant change.   Recent Labs: 08/01/2014: ALT 35 08/02/2014: Hemoglobin 13.0; Platelets 119* 08/03/2014: BUN 17; Creatinine, Ser 1.34; Potassium 3.7; Sodium 136    Lipid Panel    Component Value Date/Time   CHOL 130 05/09/2011 0505   TRIG 359* 05/09/2011 0505   HDL 27* 05/09/2011 0505    CHOLHDL 4.8 05/09/2011 0505   VLDL 72* 05/09/2011 0505   LDLCALC 31 05/09/2011 0505      Wt Readings from Last 3 Encounters:  01/17/15 289 lb 6.4 oz (131.271 kg)  08/02/14 283 lb 4.8 oz (128.504 kg)  07/27/14 289 lb (131.09 kg)        ASSESSMENT AND PLAN: 1. Ischemic heart disease. Recent successful stent to saphenous vein graft ramus/obtuse marginal 2. Status post CABG 3. Right bundle branch block 4. Depression 5. Dyslipidemia 6. Diabetes mellitus type 2 7. Chronic low back pain 8. Mild renal insufficiency    Current medicines are reviewed at length with the patient today. The patient does not have concerns regarding medicines.  The following changes have been made: no change  Labs/ tests ordered today include:  No orders of the defined types were placed in this encounter.    Disposition: FU with Dr. Mare Ferrari in 6 months for office visit and EKG. He gets his lab work from Dr. Arelia Sneddon and from the [place] efforts at weight loss    Current medicines are reviewed at length with the patient today.  The patient does not have concerns regarding medicines.  The following changes have been made:  We will reduce his amlodipine to 5 mg daily to allow a higher heart rate  Labs/ tests ordered today include:   Orders Placed This Encounter  Procedures  . EKG 12-Lead     Disposition: Recheck in 6 months for follow-up office visit.  Continue to work hard on weight loss.  Berna Spare MD 01/17/2015 7:37 PM    [address], [place], [place]  [address] Phone: [phone]; Fax: [phone]

## 2015-01-17 NOTE — Patient Instructions (Signed)
Medication Instructions:  DECREASE YOUR AMLODIPINE TO 5 MG DAILY  Labwork: NONE  Testing/Procedures: NONE  Follow-Up: Your physician wants you to follow-up in: 6 MONTH OV You will receive a reminder letter in the mail two months in advance. If you don't receive a letter, please call our office to schedule the follow-up appointment.   Any Other Special Instructions Will Be Listed Below (If Applicable). WORK HARDER ON DIET AND WEIGHT LOSS

## 2015-06-30 IMAGING — CT CT ABD-PELV W/ CM
2 of 4 series · 17 of 46 positions shown, 19 images · non-contrast
Comparison: 04/27/2008

CLINICAL DATA: Abdominal cramping, nausea, vomiting, and diarrhea
since [REDACTED].

EXAM:
CT ABDOMEN AND PELVIS WITHOUT CONTRAST
TECHNIQUE: Multidetector CT imaging of the abdomen and pelvis was performed
following the standard protocol without IV contrast.

[Series 2: abd/ pelvis 5.0 i30f 1 · axial · 0.97mm/px · z∈[+610,+1160]mm · 14 of 121 slices shown, 16 images]
[im 6/121  soft-tissue]
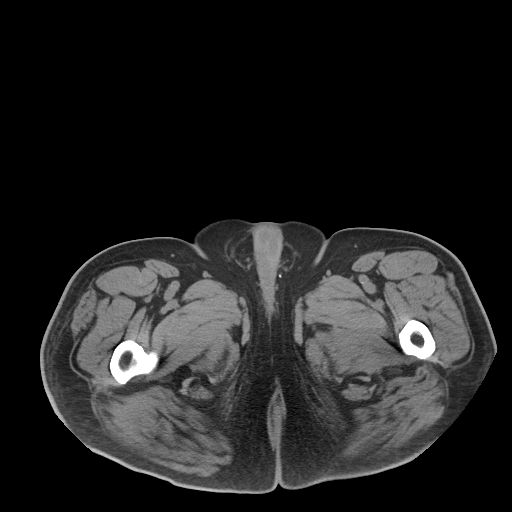
[im 6/121  bone]
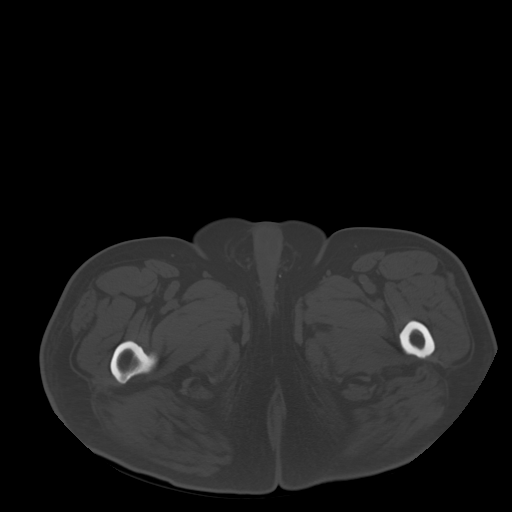
[im 16/121  soft-tissue]
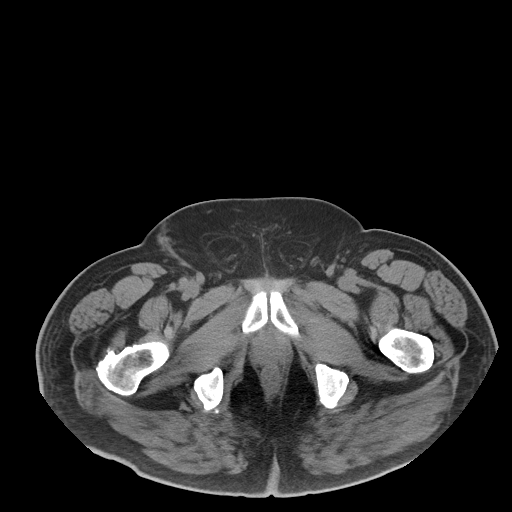
[im 26/121  soft-tissue]
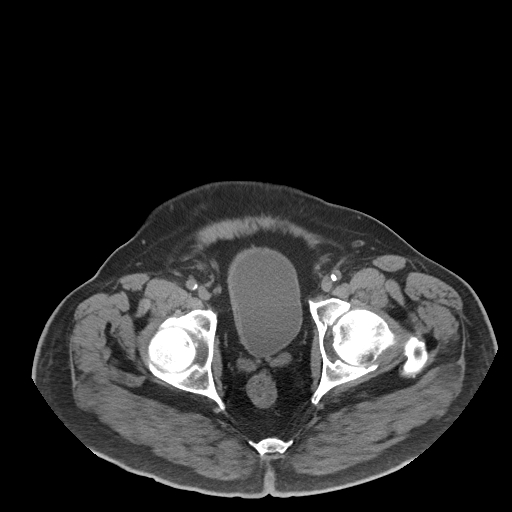
[im 31/121  soft-tissue]
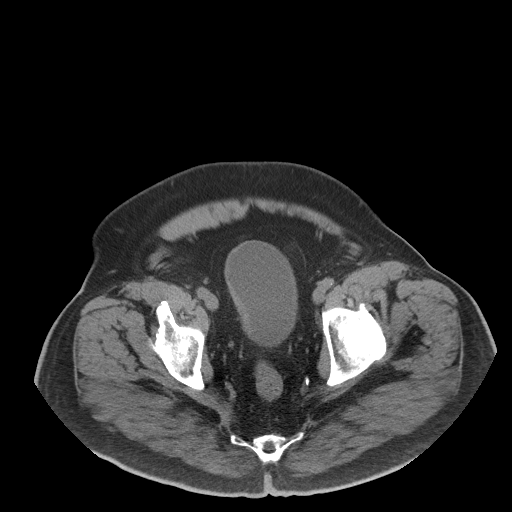
[im 41/121  soft-tissue]
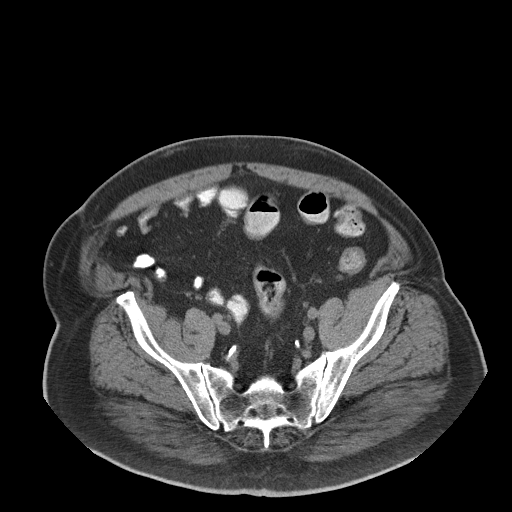
[im 51/121  soft-tissue]
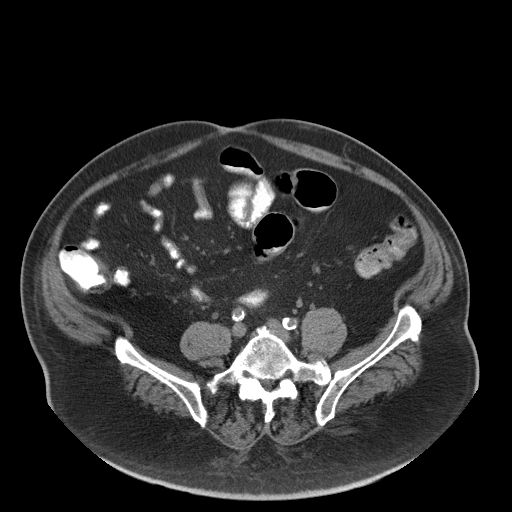
[im 56/121  soft-tissue]
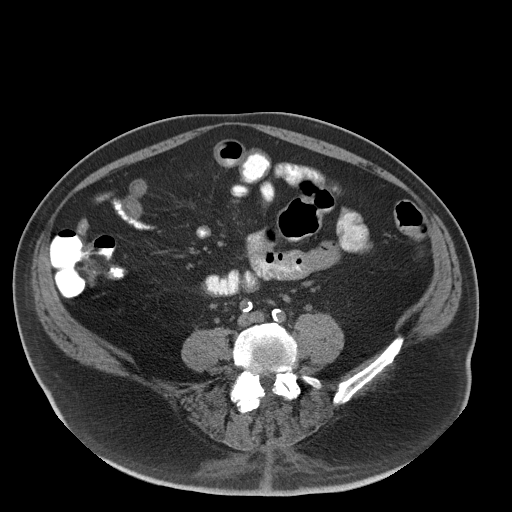
[im 66/121  soft-tissue]
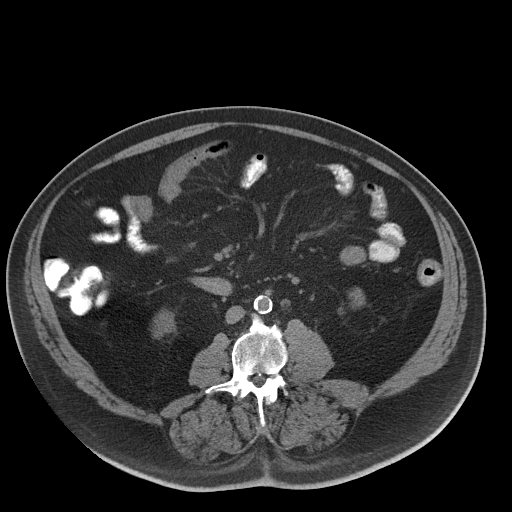
[im 71/121  soft-tissue]
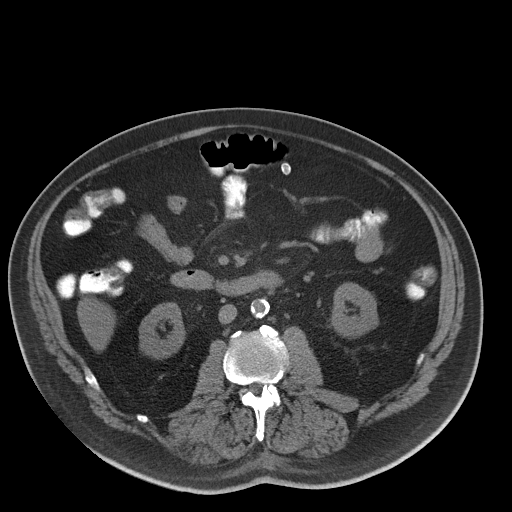
[im 71/121  bone]
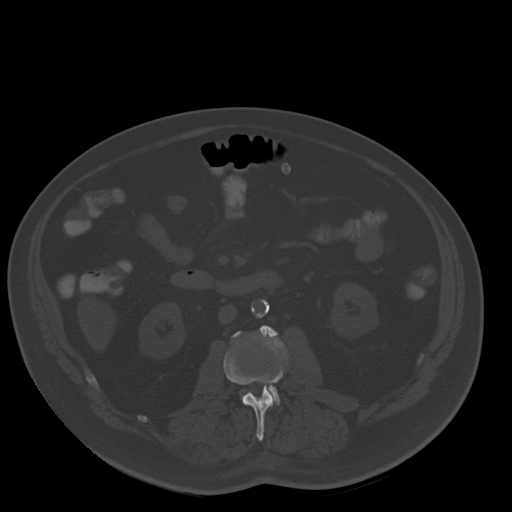
[im 81/121  soft-tissue]
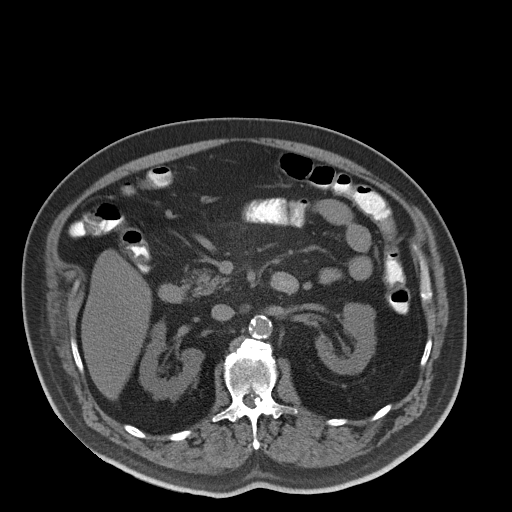
[im 91/121  soft-tissue]
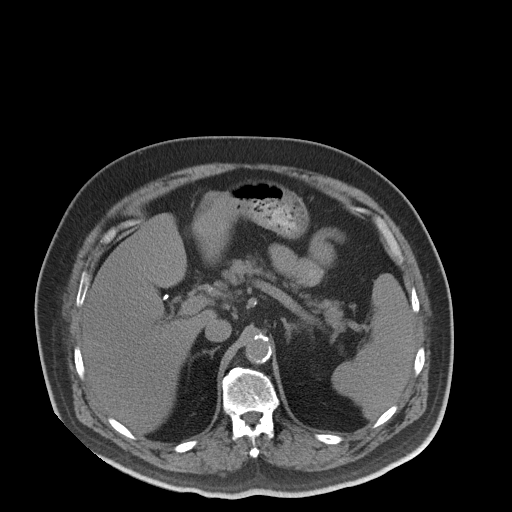
[im 96/121  soft-tissue]
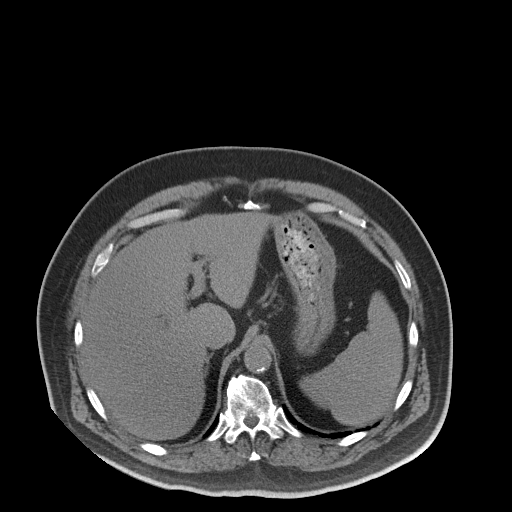
[im 106/121  soft-tissue]
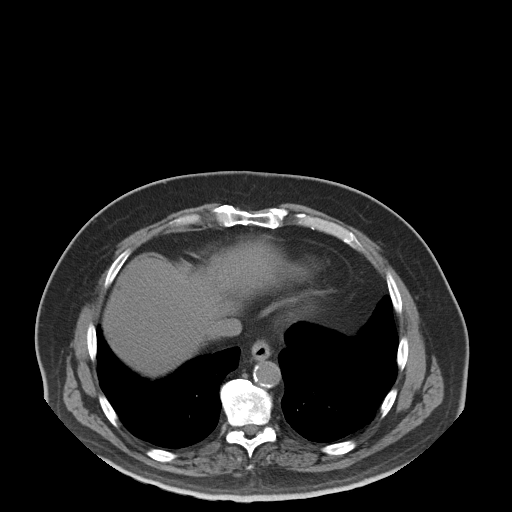
[im 116/121  soft-tissue]
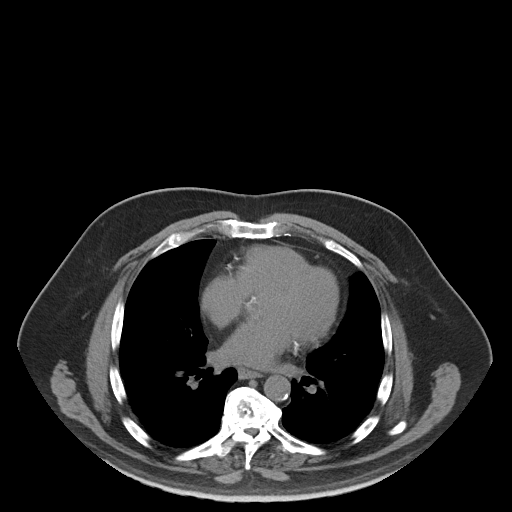

[Series 5: coronals · coronal · 1.09mm/px · 3 of 176 slices shown]
[im 59/176  soft-tissue]
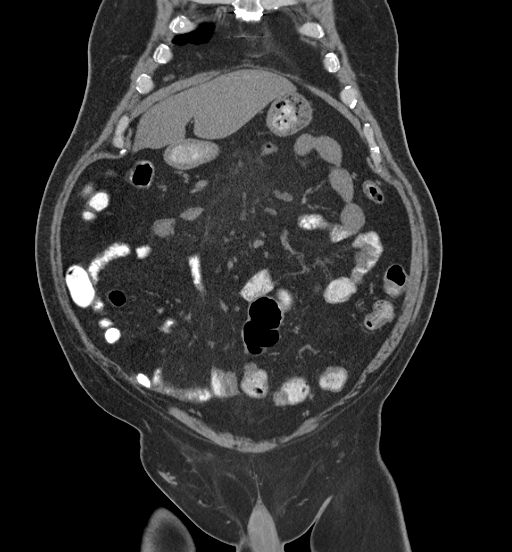
[im 78/176  soft-tissue]
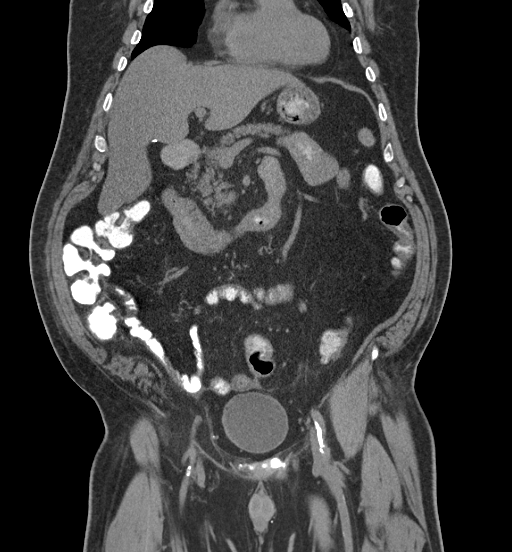
[im 98/176  soft-tissue]
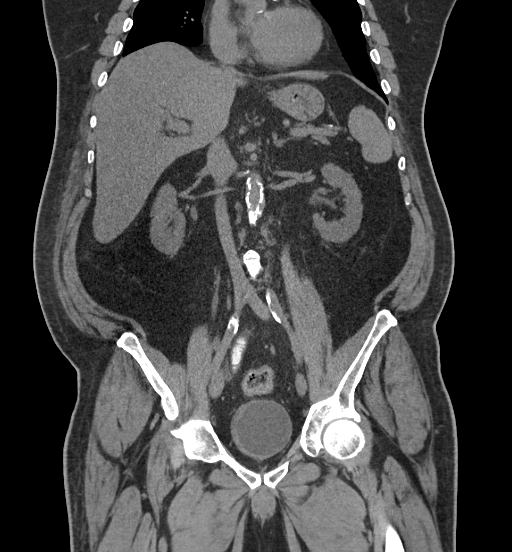

[17 of 46 positions shown; findings below may reference images not displayed]

FINDINGS: Lung bases are clear. Coronary artery calcification with
postoperative changes consistent with bypass graft.

Surgical absence of the gallbladder. The unenhanced appearance of
the liver, spleen, pancreas, adrenal glands, kidneys, inferior vena
cava, and retroperitoneal lymph nodes is unremarkable. Calcification
of the abdominal aorta. No aneurysm. Stomach, small bowel, and colon
appear normal for degree of distention. Contrast material flows
through to the colon without evidence of bowel obstruction. Mild
hazy infiltration in the mesenteric fat of nonspecific etiology.
This may represent mesenteritis. No significant lymphadenopathy. No
free air or free fluid in the abdomen.

Pelvis: Prostate gland is not enlarged. Bladder wall is not
thickened. No free or loculated pelvic fluid collections. No pelvic
mass or lymphadenopathy. Diverticulosis of the sigmoid colon. No
evidence of diverticulitis. Degenerative changes in the spine and
hips. No destructive bone lesions.
IMPRESSION: Nonspecific hazy infiltration in the mesenteric could represent
inflammatory process. No evidence of bowel obstruction.

## 2015-07-20 ENCOUNTER — Encounter: Payer: Self-pay | Admitting: Cardiology

## 2015-07-20 ENCOUNTER — Ambulatory Visit (INDEPENDENT_AMBULATORY_CARE_PROVIDER_SITE_OTHER): Payer: Medicare Other | Admitting: Cardiology

## 2015-07-20 VITALS — BP 126/72 | HR 92 | Ht 75.0 in | Wt 291.0 lb

## 2015-07-20 DIAGNOSIS — I259 Chronic ischemic heart disease, unspecified: Secondary | ICD-10-CM | POA: Diagnosis not present

## 2015-07-20 DIAGNOSIS — Z951 Presence of aortocoronary bypass graft: Secondary | ICD-10-CM

## 2015-07-20 DIAGNOSIS — R079 Chest pain, unspecified: Secondary | ICD-10-CM | POA: Diagnosis not present

## 2015-07-20 DIAGNOSIS — I119 Hypertensive heart disease without heart failure: Secondary | ICD-10-CM

## 2015-07-20 NOTE — Progress Notes (Signed)
Cardiology Office Note   Date:  07/20/2015   ID:  Tyler Le, DOB 31-Jan-1946, MRN [id number]  PCP:  Leonard Downing, MD  Cardiologist: Darlin Coco MD  No chief complaint on file.     History of Present Illness: Tyler Le is a 70 y.o. male who presents for  Scheduled six-month follow-up visit  Tyler Le is a 70 y.o. male who presents for follow-up office visit He has a history of known ischemic heart disease. He underwent coronary artery bypass graft surgery in 2001. In March 2012 he underwent bare-metal stent placement to the saphenous vein graft to the intermediate and obtuse marginal.  The patient was seen in December 2015 complaining of more frequent chest pain The patient had a Lexiscan Myoview stress test on 06/29/14 which showed Intermediate risk stress nuclear study demonstrating a mostly reversible defect along the mid to distal anterior wall/apical wall distribution. 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 Cardiac catheterization was recommended. On 07/11/14 patient underwent cardiac catheterization by Dr. Peter Martinique with the following findings: 1. Severe 3 vessel obstructive CAD 2. Patent LIMA to the LAD. 3. Restenosis in the stent in SVG to ramus/OM 4. Occluded SVG to diagonal 5. Occluded SVG to RCA 6. Successful stenting of the SVG to ramus/OM ostium with a DES.   Recommendations:  Continue DAPT indefinitely if possible. Needs to take at least one year.  Since last visit the patient has been doing reasonably well. His blood pressure has been running low yesterday and today. He has been feeling more fatigued. He has not had any syncope. He has occasional chest pain and takes sublingual nitroglycerin with improvement. He continues to have problems with anxiety attacks, particularly at night when he lies down to go to sleep.   the patient comes in today for a scheduled follow-up visit. He states that  he is not taking aspirin. And he is only taking the Brilinta once a day. He has been having some shortness of breath. He fell last week and injured his left shoulder.  Since then he is also been having some left shoulder discomfort. He has been taking occasional sublingual nitroglycerin for chest pain. He has not been careful with his diet and his weight is up. He has been eating salty food. He has edema.  Past Medical History  Diagnosis Date  . Essential hypertension   . Dyslipidemia   . Coronary artery disease     a. 2001: s/p CABG X4;  b. 08/2010 s/p PCI/BMS to VG->RI->OM;  c. 06/2014 Cath/PCI: LM nl, LAD 139m LCX 734mRI 100, OM1 100, OM2 80, small, RCA 100ost, VG->RCA 100, VG->Diag 100, VG->RI->OM1 70-80 ISR (4.0x24 Promus DES), LIMA->LAD nl.  . Morbid obesity ([facility]  . Dysrhythmia   . History of blood transfusion 2001    "related to OHS"  . Anemia   . GERD (gastroesophageal reflux disease)   . Depression   . Anxiety   . Nephrolithiasis   . Complication of anesthesia     "they have a hard time waking him up"  . Type II diabetes mellitus ([facility]  . Arthritis     "back, neck" (07/11/2014)  . Chronic lower back pain   . Thrombocytopenia ([facility]    a. Borderline low platelets by labs noted in 06/2014 but also noted on prior labs as well.  . Renal insufficiency     a. noted 06/2014 - baseline unclear as no  data since 2012.  Marland Kitchen RBBB     Past Surgical History  Procedure Laterality Date  . Cardiac catheterization  2001; ~ 2010  . Coronary artery bypass graft  2001    "CABG X 5"  . Appendectomy  ~ 1955  . Lumbar laminectomy  01/2011  . Back surgery    . Cholecystectomy    . Shoulder arthroscopy w/ rotator cuff repair Left 1990's  . Cardiovascular stress test  07/03/2009    EF 64%  . Left heart catheterization with coronary/graft angiogram N/A 05/09/2011    Procedure: LEFT HEART CATHETERIZATION WITH Beatrix Fetters;  Surgeon: Burnell Blanks, MD;  Location: [facility] CATH LAB;   Service: Cardiovascular;  Laterality: N/A;  . Tonsillectomy and adenoidectomy  1951  . Left heart catheterization with coronary angiogram N/A 07/11/2014    Procedure: LEFT HEART CATHETERIZATION WITH CORONARY ANGIOGRAM;  Surgeon: Peter M Martinique, MD;  Location: [facility] CATH LAB;  Service: Cardiovascular;  Laterality: N/A;  . Coronary angioplasty with stent placement  March 2012    BMS to SVG to intermediate/OM  . Coronary angioplasty with stent placement  07/11/2014    "1"     Current Outpatient Prescriptions  Medication Sig Dispense Refill  . amLODipine (NORVASC) 5 MG tablet Take 1 tablet (5 mg total) by mouth daily. 90 tablet 3  . Blood Glucose Monitoring Suppl (FREESTYLE FREEDOM) KIT     . busPIRone (BUSPAR) 15 MG tablet Take 15 mg by mouth daily.     . carvedilol (COREG) 25 MG tablet Take 0.5 tablets (12.5 mg total) by mouth 2 (two) times daily with a meal. 60 tablet 6  . Cholecalciferol (VITAMIN D3) 2000 UNITS TABS Take 2 tablets by mouth daily.      . CYMBALTA 60 MG capsule Take 60 mg by mouth daily.     Marland Kitchen HYDROcodone-acetaminophen (NORCO) 10-325 MG per tablet Take 1-2 tablets by mouth every 6 (six) hours as needed. For pain    . insulin aspart protamine- aspart (NOVOLOG MIX 70/30) (70-30) 100 UNIT/ML injection Inject 40-50 Units into the skin 2 (two) times daily with a meal. Inject 50 units in the morning and 40 units in the evening.    . insulin glargine (LANTUS) 100 UNIT/ML injection Inject 64 Units into the skin at bedtime.     . isosorbide mononitrate (IMDUR) 30 MG 24 hr tablet Take 30 mg by mouth daily.    Marland Kitchen LINZESS 145 MCG CAPS capsule Take 145 mcg by mouth daily.    Marland Kitchen loperamide (IMODIUM) 2 MG capsule Take 1 capsule (2 mg total) by mouth as needed for diarrhea or loose stools (max upto 59m/day). 60 capsule 0  . mirtazapine (REMERON) 15 MG tablet Take 15 mg by mouth at bedtime.     . Multiple Vitamin (MULTIVITAMIN) tablet Take 1 tablet by mouth daily.     . nitroGLYCERIN (NITROSTAT)  0.4 MG SL tablet Place 1 tablet (0.4 mg total) under the tongue every 5 (five) minutes as needed for chest pain (up to 3 doses). 100 tablet 4  . NOVOLOG FLEXPEN 100 UNIT/ML FlexPen Inject 24 Units into the skin 2 (two) times daily.     .Marland Kitchenomeprazole (PRILOSEC) 20 MG capsule Take 20 mg by mouth as needed.     . promethazine (PHENERGAN) 12.5 MG tablet Take 1 tablet (12.5 mg total) by mouth every 6 (six) hours as needed for nausea or vomiting. 30 tablet 0  . QUEtiapine (SEROQUEL) 300 MG tablet Take 300 mg by mouth  at bedtime.    . simvastatin (ZOCOR) 40 MG tablet Take 20 mg by mouth at bedtime.      . ticagrelor (BRILINTA) 90 MG TABS tablet Take 1 tablet (90 mg total) by mouth 2 (two) times daily. 60 tablet 11   No current facility-administered medications for this visit.    Allergies:   Metformin and related; Pravachol; Ramipril; and Wellbutrin    Social History:  The patient  reports that he has quit smoking. His smoking use included Cigarettes. He has a 100 pack-year smoking history. He has quit using smokeless tobacco. His smokeless tobacco use included Chew. He reports that he drinks alcohol. He reports that he does not use illicit drugs.   Family History:  The patient's family history includes Heart attack in his father; Heart disease in his brother, father, and mother.    ROS:  Please see the history of present illness.   Otherwise, review of systems are positive for none.   All other systems are reviewed and negative.    PHYSICAL EXAM: VS:  BP 126/72 mmHg  Pulse 92  Ht _0  (1.905 m)  Wt 291 lb (131.997 kg)  BMI 36.37 kg/m2  SpO2 94% , BMI Body mass index is 36.37 kg/(m^2). GEN: Well nourished, well developed, in no acute distress HEENT: normal Neck: no JVD, carotid bruits, or masses Cardiac: RRR; no murmurs, rubs, or gallops, there is 1+ pretibial edema bilaterally. Respiratory:  clear to auscultation bilaterally, normal work of breathing GI: soft, nontender, nondistended, +  BS MS: no deformity or atrophy Skin: warm and dry, no rash Neuro:  Strength and sensation are intact Psych: euthymic mood, full affect   EKG:  EKG is not ordered today.    Recent Labs: 08/01/2014: ALT 35 08/02/2014: Hemoglobin 13.0; Platelets 119* 08/03/2014: BUN 17; Creatinine, Ser 1.34; Potassium 3.7; Sodium 136    Lipid Panel    Component Value Date/Time   CHOL 130 05/09/2011 0505   TRIG 359* 05/09/2011 0505   HDL 27* 05/09/2011 0505   CHOLHDL 4.8 05/09/2011 0505   VLDL 72* 05/09/2011 0505   LDLCALC 31 05/09/2011 0505      Wt Readings from Last 3 Encounters:  07/20/15 291 lb (131.997 kg)  01/17/15 289 lb 6.4 oz (131.271 kg)  08/02/14 283 lb 4.8 oz (128.504 kg)        ASSESSMENT AND PLAN: 1. Ischemic heart disease.  History of successful stent to saphenous vein graft ramus/obtuse marginal in January 2016 2. Status post CABG 3. Right bundle branch block 4. Depression 5. Dyslipidemia 6. Diabetes mellitus type 2 7. Chronic low back pain 8. Mild renal insufficiency     Current medicines are reviewed at length with the patient today.  The patient does not have concerns regarding medicines.  The following changes have been made:   We emphasized the importance of taking his Brilinta 90 mg twice a day , not once a day.  He is not sure if he is taking isosorbide mononitrate or not. We  him in a prescription for isosorbide mononitrate 30 mg daily.  Labs/ tests ordered today include:  No orders of the defined types were placed in this encounter.     Disposition:   Return in 6 months for follow-up office visit and EKG with Dr. Acie Fredrickson  I emphasized the importance of weight loss and careful low-salt diet  Signed, Darlin Coco MD 07/20/2015 5:44 PM    [place], [place]  [address] Phone: [phone]; Fax: [phone]    

## 2015-07-20 NOTE — Patient Instructions (Addendum)
Medication Instructions:   START TAKING BIRLINTA TWICE A DAY   START TAKING IMDUR 30 MG DAILY  If you need a refill on your cardiac medications before your next appointment, please call your pharmacy.  Labwork: NONE ORDER TODAY    Testing/Procedures: NONE ORDER TODAY    Follow-Up: Your physician wants you to follow-up in:  IN  6  MONTHS WITH DR Elease Hashimoto   You will receive a reminder letter in the mail two months in advance. If you don't receive a letter, please call our office to schedule the follow-up appointment.      Any Other Special Instructions Will Be Listed Below (If Applicable).  CUT BACK ON SALT AND LOSE WEIGHT

## 2015-08-11 ENCOUNTER — Telehealth: Payer: Self-pay | Admitting: Cardiology

## 2015-08-11 NOTE — Telephone Encounter (Signed)
New message      Calling to see if we got the clearance from [facility] orthopedic.  They have not received it back

## 2015-08-11 NOTE — Telephone Encounter (Signed)
Left message to call back  

## 2015-08-11 NOTE — Telephone Encounter (Signed)
Patient has not been taking both ASA 81 mg and Brilinta Discussed with  Dr. Patty Sermons and after procedure he will restart Brilinta and continue ASA, advised patient

## 2015-08-11 NOTE — Telephone Encounter (Signed)
Clearance faxed  Per  Dr. Patty Sermons ok to be off Brlinta x 1 week but needs to take Aspirin 81 mg daily

## 2015-08-11 NOTE — Telephone Encounter (Signed)
Advised wife  

## 2015-08-23 ENCOUNTER — Encounter (HOSPITAL_COMMUNITY): Payer: Self-pay | Admitting: Emergency Medicine

## 2015-08-23 ENCOUNTER — Emergency Department (HOSPITAL_COMMUNITY): Payer: Medicare Other

## 2015-08-23 ENCOUNTER — Emergency Department (HOSPITAL_COMMUNITY)
Admission: EM | Admit: 2015-08-23 | Discharge: 2015-08-23 | Disposition: A | Discharge status: Home / Self Care | Payer: Medicare Other | Attending: Emergency Medicine | Admitting: Emergency Medicine

## 2015-08-23 DIAGNOSIS — R42 Dizziness and giddiness: Secondary | ICD-10-CM | POA: Insufficient documentation

## 2015-08-23 DIAGNOSIS — Z794 Long term (current) use of insulin: Secondary | ICD-10-CM | POA: Diagnosis not present

## 2015-08-23 DIAGNOSIS — I2510 Atherosclerotic heart disease of native coronary artery without angina pectoris: Secondary | ICD-10-CM | POA: Diagnosis not present

## 2015-08-23 DIAGNOSIS — Z951 Presence of aortocoronary bypass graft: Secondary | ICD-10-CM | POA: Diagnosis not present

## 2015-08-23 DIAGNOSIS — Z79899 Other long term (current) drug therapy: Secondary | ICD-10-CM | POA: Diagnosis not present

## 2015-08-23 DIAGNOSIS — E785 Hyperlipidemia, unspecified: Secondary | ICD-10-CM | POA: Insufficient documentation

## 2015-08-23 DIAGNOSIS — Z87442 Personal history of urinary calculi: Secondary | ICD-10-CM | POA: Insufficient documentation

## 2015-08-23 DIAGNOSIS — F329 Major depressive disorder, single episode, unspecified: Secondary | ICD-10-CM | POA: Diagnosis not present

## 2015-08-23 DIAGNOSIS — Z87891 Personal history of nicotine dependence: Secondary | ICD-10-CM | POA: Insufficient documentation

## 2015-08-23 DIAGNOSIS — I10 Essential (primary) hypertension: Secondary | ICD-10-CM | POA: Insufficient documentation

## 2015-08-23 DIAGNOSIS — Z862 Personal history of diseases of the blood and blood-forming organs and certain disorders involving the immune mechanism: Secondary | ICD-10-CM | POA: Diagnosis not present

## 2015-08-23 DIAGNOSIS — Z87448 Personal history of other diseases of urinary system: Secondary | ICD-10-CM | POA: Diagnosis not present

## 2015-08-23 DIAGNOSIS — F419 Anxiety disorder, unspecified: Secondary | ICD-10-CM | POA: Diagnosis not present

## 2015-08-23 DIAGNOSIS — E119 Type 2 diabetes mellitus without complications: Secondary | ICD-10-CM | POA: Diagnosis not present

## 2015-08-23 DIAGNOSIS — R600 Localized edema: Secondary | ICD-10-CM | POA: Diagnosis not present

## 2015-08-23 DIAGNOSIS — R0602 Shortness of breath: Secondary | ICD-10-CM | POA: Diagnosis not present

## 2015-08-23 DIAGNOSIS — R110 Nausea: Secondary | ICD-10-CM | POA: Insufficient documentation

## 2015-08-23 DIAGNOSIS — Z7982 Long term (current) use of aspirin: Secondary | ICD-10-CM | POA: Insufficient documentation

## 2015-08-23 DIAGNOSIS — Z9889 Other specified postprocedural states: Secondary | ICD-10-CM | POA: Insufficient documentation

## 2015-08-23 DIAGNOSIS — M1990 Unspecified osteoarthritis, unspecified site: Secondary | ICD-10-CM | POA: Insufficient documentation

## 2015-08-23 DIAGNOSIS — Z7901 Long term (current) use of anticoagulants: Secondary | ICD-10-CM | POA: Diagnosis not present

## 2015-08-23 DIAGNOSIS — G8929 Other chronic pain: Secondary | ICD-10-CM | POA: Diagnosis not present

## 2015-08-23 DIAGNOSIS — R079 Chest pain, unspecified: Secondary | ICD-10-CM | POA: Diagnosis present

## 2015-08-23 DIAGNOSIS — Z9861 Coronary angioplasty status: Secondary | ICD-10-CM | POA: Insufficient documentation

## 2015-08-23 DIAGNOSIS — K219 Gastro-esophageal reflux disease without esophagitis: Secondary | ICD-10-CM | POA: Diagnosis not present

## 2015-08-23 LAB — COMPREHENSIVE METABOLIC PANEL
ALBUMIN: 3.5 g/dL (ref 3.5–5.0)
ALT: 21 U/L (ref 17–63)
AST: 22 U/L (ref 15–41)
Alkaline Phosphatase: 83 U/L (ref 38–126)
Anion gap: 11 (ref 5–15)
BUN: 20 mg/dL (ref 6–20)
CO2: 24 mmol/L (ref 22–32)
CREATININE: 1.42 mg/dL — AB (ref 0.61–1.24)
Calcium: 9.2 mg/dL (ref 8.9–10.3)
Chloride: 101 mmol/L (ref 101–111)
GFR calc Af Amer: 56 mL/min — ABNORMAL LOW (ref >60)
GFR, EST NON AFRICAN AMERICAN: 48 mL/min — AB (ref >60)
GLUCOSE: 195 mg/dL — AB (ref 65–99)
POTASSIUM: 4.7 mmol/L (ref 3.5–5.1)
Sodium: 136 mmol/L (ref 135–145)
Total Bilirubin: 0.2 mg/dL — ABNORMAL LOW (ref 0.3–1.2)
Total Protein: 6.3 g/dL — ABNORMAL LOW (ref 6.5–8.1)

## 2015-08-23 LAB — CBC WITH DIFFERENTIAL/PLATELET
Basophils Absolute: 0 10*3/uL (ref 0.0–0.1)
Basophils Relative: 0 %
EOS ABS: 0.2 10*3/uL (ref 0.0–0.7)
EOS PCT: 3 %
HCT: 38.6 % — ABNORMAL LOW (ref 39.0–52.0)
Hemoglobin: 12.7 g/dL — ABNORMAL LOW (ref 13.0–17.0)
LYMPHS PCT: 29 %
Lymphs Abs: 1.9 10*3/uL (ref 0.7–4.0)
MCH: 29.8 pg (ref 26.0–34.0)
MCHC: 32.9 g/dL (ref 30.0–36.0)
MCV: 90.6 fL (ref 78.0–100.0)
MONO ABS: 0.5 10*3/uL (ref 0.1–1.0)
Monocytes Relative: 8 %
Neutro Abs: 4 10*3/uL (ref 1.7–7.7)
Neutrophils Relative %: 60 %
PLATELETS: 130 10*3/uL — AB (ref 150–400)
RBC: 4.26 MIL/uL (ref 4.22–5.81)
RDW: 12.9 % (ref 11.5–15.5)
WBC: 6.6 10*3/uL (ref 4.0–10.5)

## 2015-08-23 LAB — BRAIN NATRIURETIC PEPTIDE: B NATRIURETIC PEPTIDE 5: 42 pg/mL (ref 0.0–100.0)

## 2015-08-23 LAB — I-STAT TROPONIN, ED: Troponin i, poc: 0.04 ng/mL (ref 0.00–0.08)

## 2015-08-23 MED ORDER — IOHEXOL 350 MG/ML SOLN
80.0000 mL | Freq: Once | INTRAVENOUS | Status: AC | PRN
  Administered 2015-08-23: 80 mL via INTRAVENOUS

## 2015-08-23 NOTE — ED Notes (Signed)
Patient transported to CT 

## 2015-08-23 NOTE — ED Notes (Signed)
Per EMS, pt from home with c/o chest pain, and tightness, with radiation to the back. Pt has been having chest pain x "a few weeks". Pain normally subsides with nitro, but tonight pt took 3 nitros at home and 324 aspirin. Pt c/o nausea, shortness of breath, and dizziness with chest pain. Upon arrival pt is chest pain free. BP-150/82, HR-100, resp-18, CBG-176

## 2015-08-23 NOTE — Discharge Instructions (Signed)
°  Follow-up with your cardiologist and primary Dr. in the next week, and return to the ER if symptoms significantly worsen or change.   Shortness of Breath Shortness of breath means you have trouble breathing. It could also mean that you have a medical problem. You should get immediate medical care for shortness of breath. CAUSES   Not enough oxygen in the air such as with high altitudes or a smoke-filled room.  Certain lung diseases, infections, or problems.  Heart disease or conditions, such as angina or heart failure.  Low red blood cells (anemia).  Poor physical fitness, which can cause shortness of breath when you exercise.  Chest or back injuries or stiffness.  Being overweight.  Smoking.  Anxiety, which can make you feel like you are not getting enough air. DIAGNOSIS  Serious medical problems can often be found during your physical exam. Tests may also be done to determine why you are having shortness of breath. Tests may include:  Chest X-rays.  Lung function tests.  Blood tests.  An electrocardiogram (ECG).  An ambulatory electrocardiogram. An ambulatory ECG records your heartbeat patterns over a 24-hour period.  Exercise testing.  A transthoracic echocardiogram (TTE). During echocardiography, sound waves are used to evaluate how blood flows through your heart.  A transesophageal echocardiogram (TEE).  Imaging scans. Your health care provider may not be able to find a cause for your shortness of breath after your exam. In this case, it is important to have a follow-up exam with your health care provider as directed.  TREATMENT  Treatment for shortness of breath depends on the cause of your symptoms and can vary greatly. HOME CARE INSTRUCTIONS   Do not smoke. Smoking is a common cause of shortness of breath. If you smoke, ask for help to quit.  Avoid being around chemicals or things that may bother your breathing, such as paint fumes and dust.  Rest as  needed. Slowly resume your usual activities.  If medicines were prescribed, take them as directed for the full length of time directed. This includes oxygen and any inhaled medicines.  Keep all follow-up appointments as directed by your health care provider. SEEK MEDICAL CARE IF:   Your condition does not improve in the time expected.  You have a hard time doing your normal activities even with rest.  You have any new symptoms. SEEK IMMEDIATE MEDICAL CARE IF:   Your shortness of breath gets worse.  You feel light-headed, faint, or develop a cough not controlled with medicines.  You start coughing up blood.  You have pain with breathing.  You have chest pain or pain in your arms, shoulders, or abdomen.  You have a fever.  You are unable to walk up stairs or exercise the way you normally do. MAKE SURE YOU:  Understand these instructions.  Will watch your condition.  Will get help right away if you are not doing well or get worse.   This information is not intended to replace advice given to you by your health care provider. Make sure you discuss any questions you have with your health care provider.   Document Released: 03/05/2001 Document Revised: 06/15/2013 Document Reviewed: 08/26/2011 Elsevier Interactive Patient Education [employer]

## 2015-08-23 NOTE — ED Provider Notes (Signed)
CSN: [id number]     Arrival date & time 08/23/15  0013 History   By signing my name below, I, Tyler Le, attest that this documentation has been prepared under the direction and in the presence of Tyler Speak, MD.  Electronically Signed: Forrestine Le, ED Scribe. 08/23/2015. 1:16 AM.   Chief Complaint  Patient presents with  . Chest Pain   Patient is a 70 y.o. male presenting with chest pain. The history is provided by the patient. No language interpreter was used.  Chest Pain Pain location:  Unable to specify Pain radiates to the back: yes   Pain severity:  Moderate Onset quality:  Gradual Duration:  5 weeks Timing:  Intermittent Progression:  Unchanged Chronicity:  Recurrent Relieved by:  Nothing Worsened by:  Nothing tried Ineffective treatments:  Nitroglycerin and aspirin Associated symptoms: back pain, dizziness and shortness of breath   Associated symptoms: no abdominal pain, no cough, no diaphoresis, no fever, no headache, no nausea and not vomiting   Risk factors: coronary artery disease, diabetes mellitus and hypertension     HPI Comments: Tyler Le brought in by EMS is a 70 y.o. male with a PMHx of HTN, CAD, DM, renal insufficiency, and RBBB who presents to the Emergency Department complaining of intermittent, ongoing chest pain that radiates to the back x 4-5 weeks; worsened this evening. Pain is described as tightness and states episodes typically come on at night time. Pt also reports nausea, shortness of breath; worsened with exertion, and mild dizziness. 3 Nitro, 324 ASA, and 1 dose of Hydrocodone attempted prior to arrival without any improvement. No recent fever, chills, or abdominal pain. He is not currently on oxygen at home. Pt is currently on Brilinta but has been off of this medication for 1 week to receive a cortisone injection. Pt was directed to restart his Brilinta today. PSHx includes CABG x 5 in 2001, and Left heart catheterization with coronary/graft  angiogram in 2012.  PCP: Tyler Downing, MD   CARDIOLOGIST: Tyler Coco MD  Past Medical History  Diagnosis Date  . Essential hypertension   . Dyslipidemia   . Coronary artery disease     a. 2001: s/p CABG X4;  b. 08/2010 s/p PCI/BMS to VG->RI->OM;  c. 06/2014 Cath/PCI: LM nl, LAD 163m LCX 7103mRI 100, OM1 100, OM2 80, small, RCA 100ost, VG->RCA 100, VG->Diag 100, VG->RI->OM1 70-80 ISR (4.0x24 Promus DES), LIMA->LAD nl.  . Morbid obesity ([facility] [place]  . Dysrhythmia   . History of blood transfusion 2001    "related to OHS"  . Anemia   . GERD (gastroesophageal reflux disease)   . Depression   . Anxiety   . Nephrolithiasis   . Complication of anesthesia     "they have a hard time waking Le up"  . Type II diabetes mellitus ([facility]  . Arthritis     "back, neck" (07/11/2014)  . Chronic lower back pain   . Thrombocytopenia ([facility]    a. Borderline low platelets by labs noted in 06/2014 but also noted on prior labs as well.  . Renal insufficiency     a. noted 06/2014 - baseline unclear as no data since 2012.  . Marland KitchenBBB    Past Surgical History  Procedure Laterality Date  . Cardiac catheterization  2001; ~ 2010  . Coronary artery bypass graft  2001    "CABG X 5"  . Appendectomy  ~ 1955  . Lumbar laminectomy  01/2011  . Back surgery    .  Cholecystectomy    . Shoulder arthroscopy w/ rotator cuff repair Left 1990's  . Cardiovascular stress test  07/03/2009    EF 64%  . Left heart catheterization with coronary/graft angiogram N/A 05/09/2011    Procedure: LEFT HEART CATHETERIZATION WITH Beatrix Fetters;  Surgeon: Burnell Blanks, MD;  Location: [facility] CATH LAB;  Service: Cardiovascular;  Laterality: N/A;  . Tonsillectomy and adenoidectomy  1951  . Left heart catheterization with coronary angiogram N/A 07/11/2014    Procedure: LEFT HEART CATHETERIZATION WITH CORONARY ANGIOGRAM;  Surgeon: Peter M Martinique, MD;  Location: [facility] CATH LAB;  Service: Cardiovascular;  Laterality: N/A;  .  Coronary angioplasty with stent placement  March 2012    BMS to SVG to intermediate/OM  . Coronary angioplasty with stent placement  07/11/2014    "1"   Family History  Problem Relation Age of Onset  . Heart disease Mother   . Heart disease Father   . Heart attack Father   . Heart disease Brother    Social History  Substance Use Topics  . Smoking status: Former Smoker -- 4.00 packs/day for 25 years    Types: Cigarettes  . Smokeless tobacco: Former Systems developer    Types: Chew     Comment: "quit smoking ~ 1985; chewed off and on for a few years; quit chewing in ~ 1985 too"  . Alcohol Use: Yes     Comment: 07/11/2014 "might take a drink 5X/yr"    Review of Systems  Constitutional: Negative for fever and diaphoresis.  Respiratory: Positive for shortness of breath. Negative for cough.   Cardiovascular: Positive for chest pain and leg swelling.  Gastrointestinal: Negative for nausea, vomiting and abdominal pain.  Musculoskeletal: Positive for back pain.  Neurological: Positive for dizziness. Negative for headaches.  Psychiatric/Behavioral: Negative for confusion.  All other systems reviewed and are negative.     Allergies  Metformin and related; Pravachol; Ramipril; and Wellbutrin  Home Medications   Prior to Admission medications   Medication Sig Start Date End Date Taking? Authorizing Provider  amLODipine (NORVASC) 5 MG tablet Take 1 tablet (5 mg total) by mouth daily. 01/17/15   Tyler Coco, MD  aspirin 81 MG tablet Take 81 mg by mouth daily.    Historical Provider, MD  Blood Glucose Monitoring Suppl (FREESTYLE FREEDOM) KIT  03/27/12   Historical Provider, MD  busPIRone (BUSPAR) 15 MG tablet Take 15 mg by mouth daily.  03/14/12   Historical Provider, MD  carvedilol (COREG) 25 MG tablet Take 0.5 tablets (12.5 mg total) by mouth 2 (two) times daily with a meal. 07/12/14   Dayna N Dunn, PA-C  Cholecalciferol (VITAMIN D3) 2000 UNITS TABS Take 2 tablets by mouth daily.      Historical  Provider, MD  CYMBALTA 60 MG capsule Take 60 mg by mouth daily.  03/30/12   Historical Provider, MD  HYDROcodone-acetaminophen (NORCO) 10-325 MG per tablet Take 1-2 tablets by mouth every 6 (six) hours as needed. For pain    Historical Provider, MD  insulin aspart protamine- aspart (NOVOLOG MIX 70/30) (70-30) 100 UNIT/ML injection Inject 40-50 Units into the skin 2 (two) times daily with a meal. Inject 50 units in the morning and 40 units in the evening.    Historical Provider, MD  insulin glargine (LANTUS) 100 UNIT/ML injection Inject 64 Units into the skin at bedtime.     Historical Provider, MD  isosorbide mononitrate (IMDUR) 30 MG 24 hr tablet Take 30 mg by mouth daily.    Historical Provider,  MD  LINZESS 145 MCG CAPS capsule Take 145 mcg by mouth daily. 01/03/15   Historical Provider, MD  loperamide (IMODIUM) 2 MG capsule Take 1 capsule (2 mg total) by mouth as needed for diarrhea or loose stools (max upto 3m/day). 08/03/14   Ripudeep KKrystal Eaton MD  mirtazapine (REMERON) 15 MG tablet Take 15 mg by mouth at bedtime.  03/30/12   Historical Provider, MD  Multiple Vitamin (MULTIVITAMIN) tablet Take 1 tablet by mouth daily.     Historical Provider, MD  nitroGLYCERIN (NITROSTAT) 0.4 MG SL tablet Place 1 tablet (0.4 mg total) under the tongue every 5 (five) minutes as needed for chest pain (up to 3 doses). 06/14/14 10/12/15  TDarlin Coco MD  NOVOLOG FLEXPEN 100 UNIT/ML FlexPen Inject 24 Units into the skin 2 (two) times daily.  07/01/13   Historical Provider, MD  omeprazole (PRILOSEC) 20 MG capsule Take 20 mg by mouth as needed.     Historical Provider, MD  promethazine (PHENERGAN) 12.5 MG tablet Take 1 tablet (12.5 mg total) by mouth every 6 (six) hours as needed for nausea or vomiting. 08/03/14   Ripudeep KKrystal Eaton MD  QUEtiapine (SEROQUEL) 300 MG tablet Take 300 mg by mouth at bedtime.    Historical Provider, MD  simvastatin (ZOCOR) 40 MG tablet Take 20 mg by mouth at bedtime.      Historical Provider, MD   ticagrelor (BRILINTA) 90 MG TABS tablet Take 1 tablet (90 mg total) by mouth 2 (two) times daily. 07/12/14   Dayna N Dunn, PA-C   Triage Vitals: BP 154/82 mmHg  Pulse 98  Temp(Src) 98.2 F (36.8 C) (Oral)  Ht 6' 4" (1.93 m)  Wt 285 lb (129.275 kg)  BMI 34.71 kg/m2  SpO2 93%   Physical Exam  Constitutional: He is oriented to person, place, and time. He appears well-developed and well-nourished.  HENT:  Head: Normocephalic and atraumatic.  Eyes: EOM are normal.  Neck: Normal range of motion.  Cardiovascular: Normal rate, regular rhythm, normal heart sounds and intact distal pulses.   Pulmonary/Chest: Effort normal. No respiratory distress. He has rales.  Rales in bases bilaterally   Abdominal: Soft. He exhibits no distension. There is no tenderness.  Musculoskeletal: Normal range of motion. He exhibits edema.  2 plus pitting edema in lower extremities bilaterally   Neurological: He is alert and oriented to person, place, and time.  Skin: Skin is warm and dry.  Psychiatric: He has a normal mood and affect. Judgment normal.  Nursing note and vitals reviewed.   ED Course  Procedures (including critical care time)  DIAGNOSTIC STUDIES: Oxygen Saturation is 93% on RA, adequate by my interpretation.    COORDINATION OF CARE: 1:01 AM- Will order CXR, blood work, and EKG. Discussed treatment plan with pt at bedside and pt agreed to plan.     Labs Review Labs Reviewed - No data to display  Imaging Review No results found. I have personally reviewed and evaluated these images and lab results as part of my medical decision-making.   EKG Interpretation   Date/Time:  Wednesday August 23 2015 00:22:05 EST Ventricular Rate:  101 PR Interval:  168 QRS Duration: 166 QT Interval:  393 QTC Calculation: 509 R Axis:   67 Text Interpretation:  Sinus tachycardia Right bundle branch block  Confirmed by   MD,  [id number] on 08/23/2015 6:28:57 AM      MDM   Final diagnoses:   None    Patient is a 70year old male with history  of coronary artery disease, status post coronary artery bypass graft. He presents for evaluation of chest discomfort that occurs at night when he is lying down to sleep. This has been going on for 4 or 5 weeks. His workup this evening is essentially unremarkable. Troponin is negative and EKG is unchanged. He has undergone a CT angiography of the chest rule out pulmonary embolus which was also negative. His BNP is normal and chest x-ray does not reveal evidence for heart failure. I'm uncertain as to what is causing this patient's symptoms, however it does not appear emergent. He is to follow-up with his cardiologist and primary doctor in the next week for a recheck.  I personally performed the services described in this documentation, which was scribed in my presence. The recorded information has been reviewed and is accurate.       Tyler Speak, MD 08/24/15 [phone]

## 2015-08-31 ENCOUNTER — Other Ambulatory Visit: Payer: Self-pay | Admitting: Family Medicine

## 2015-08-31 DIAGNOSIS — R7989 Other specified abnormal findings of blood chemistry: Secondary | ICD-10-CM

## 2015-09-02 ENCOUNTER — Other Ambulatory Visit: Payer: Self-pay | Admitting: Physician Assistant

## 2015-09-04 ENCOUNTER — Ambulatory Visit
Admission: RE | Admit: 2015-09-04 | Discharge: 2015-09-04 | Disposition: A | Discharge status: Home / Self Care | Payer: Medicare Other | Source: Ambulatory Visit | Attending: Family Medicine | Admitting: Family Medicine

## 2015-09-04 DIAGNOSIS — R7989 Other specified abnormal findings of blood chemistry: Secondary | ICD-10-CM

## 2015-09-11 ENCOUNTER — Other Ambulatory Visit: Payer: Self-pay | Admitting: Family Medicine

## 2015-09-11 DIAGNOSIS — E041 Nontoxic single thyroid nodule: Secondary | ICD-10-CM

## 2015-10-11 ENCOUNTER — Encounter: Payer: Self-pay | Admitting: Cardiovascular Disease

## 2015-10-11 ENCOUNTER — Ambulatory Visit (INDEPENDENT_AMBULATORY_CARE_PROVIDER_SITE_OTHER): Payer: Medicare Other | Admitting: Cardiovascular Disease

## 2015-10-11 ENCOUNTER — Encounter: Payer: Self-pay | Admitting: Nurse Practitioner

## 2015-10-11 VITALS — BP 142/80 | HR 84 | Ht 76.0 in | Wt 290.8 lb

## 2015-10-11 DIAGNOSIS — I25110 Atherosclerotic heart disease of native coronary artery with unstable angina pectoris: Secondary | ICD-10-CM | POA: Diagnosis not present

## 2015-10-11 DIAGNOSIS — Z951 Presence of aortocoronary bypass graft: Secondary | ICD-10-CM | POA: Diagnosis not present

## 2015-10-11 LAB — BASIC METABOLIC PANEL
BUN: 16 mg/dL (ref 7–25)
CO2: 28 mmol/L (ref 20–31)
Calcium: 9.4 mg/dL (ref 8.6–10.3)
Chloride: 102 mmol/L (ref 98–110)
Creat: 1.4 mg/dL — ABNORMAL HIGH (ref 0.70–1.18)
GLUCOSE: 161 mg/dL — AB (ref 65–99)
POTASSIUM: 4.4 mmol/L (ref 3.5–5.3)
SODIUM: 140 mmol/L (ref 135–146)

## 2015-10-11 LAB — CBC WITH DIFFERENTIAL/PLATELET
BASOS ABS: 0 {cells}/uL (ref 0–200)
Basophils Relative: 0 %
EOS PCT: 3 %
Eosinophils Absolute: 150 cells/uL (ref 15–500)
HEMATOCRIT: 40.6 % (ref 38.5–50.0)
HEMOGLOBIN: 13.6 g/dL (ref 13.2–17.1)
LYMPHS ABS: 1700 {cells}/uL (ref 850–3900)
Lymphocytes Relative: 34 %
MCH: 30.5 pg (ref 27.0–33.0)
MCHC: 33.5 g/dL (ref 32.0–36.0)
MCV: 91 fL (ref 80.0–100.0)
MONO ABS: 400 {cells}/uL (ref 200–950)
MPV: 10 fL (ref 7.5–12.5)
Monocytes Relative: 8 %
NEUTROS ABS: 2750 {cells}/uL (ref 1500–7800)
NEUTROS PCT: 55 %
Platelets: 147 10*3/uL (ref 140–400)
RBC: 4.46 MIL/uL (ref 4.20–5.80)
RDW: 14.3 % (ref 11.0–15.0)
WBC: 5 10*3/uL (ref 3.8–10.8)

## 2015-10-11 MED ORDER — ISOSORBIDE MONONITRATE ER 60 MG PO TB24: 60.0000 mg | ORAL_TABLET | Freq: Every day | ORAL | Status: DC

## 2015-10-11 NOTE — Patient Instructions (Signed)
Medication Instructions:  INCREASE Imdur to 60 mg daily   Labwork: TODAY - Pt/INR, Basic metabolic panel, CBC   Testing/Procedures: Your physician has requested that you have a cardiac catheterization. Cardiac catheterization is used to diagnose and/or treat various heart conditions. Doctors may recommend this procedure for a number of different reasons. The most common reason is to evaluate chest pain. Chest pain can be a symptom of coronary artery disease (CAD), and cardiac catheterization can show whether plaque is narrowing or blocking your heart's arteries. This procedure is also used to evaluate the valves, as well as measure the blood flow and oxygen levels in different parts of your heart. For further information please visit [email or web address] Please follow instruction sheet, as given.   Follow-Up: Your physician recommends that you schedule a follow-up appointment in: 3 months with Dr. Elease HashimotoNahser.    If you need a refill on your cardiac medications before your next appointment, please call your pharmacy.   Thank you for choosing [facility]! Eligha BridegroomMichelle Bhavya Eschete, RN [phone]

## 2015-10-11 NOTE — Progress Notes (Signed)
Cardiology Office Note   Date:  10/11/2015   ID:  Tyler Le, DOB [birth year], MRN [id number]  PCP:  Kaleen Mask, MD  Cardiologist: Cassell Clement MD  Chief Complaint  Patient presents with  . Hypertension   Problem list 1. Coronary artery disease-status post coronary artery bypass grafting 2. Hypertension 3. Diabetes mellitus 4.  Dyslipidemia   History of Present Illness: Tyler Le is a 70 y.o. male who presents for  Scheduled six-month follow-up visit  Tyler Le is a 70 y.o. male who presents for follow-up office visit He has a history of known ischemic heart disease. He underwent coronary artery bypass graft surgery in 2001. In March 2012 he underwent bare-metal stent placement to the saphenous vein graft to the intermediate and obtuse marginal.  The patient was seen in December 2015 complaining of more frequent chest pain The patient had a Lexiscan Myoview stress test on 06/29/14 which showed Intermediate risk stress nuclear study demonstrating a mostly reversible defect along the mid to distal anterior wall/apical wall distribution. 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 Cardiac catheterization was recommended. On 07/11/14 patient underwent cardiac catheterization by Dr. Peter Swaziland with the following findings: 1. Severe 3 vessel obstructive CAD 2. Patent LIMA to the LAD. 3. Restenosis in the stent in SVG to ramus/OM 4. Occluded SVG to diagonal 5. Occluded SVG to RCA 6. Successful stenting of the SVG to ramus/OM ostium with a DES.   Recommendations:  Continue DAPT indefinitely if possible. Needs to take at least one year.  Since last visit the patient has been doing reasonably well. His blood pressure has been running low yesterday and today. He has been feeling more fatigued. He has not had any syncope. He has occasional chest pain and takes sublingual nitroglycerin with improvement. He continues to  have problems with anxiety attacks, particularly at night when he lies down to go to sleep.   the patient comes in today for a scheduled follow-up visit. He states that he is not taking aspirin. And he is only taking the Brilinta once a day. He has been having some shortness of breath. He fell last week and injured his left shoulder.  Since then he is also been having some left shoulder discomfort. He has been taking occasional sublingual nitroglycerin for chest pain. He has not been careful with his diet and his weight is up. He has been eating salty food. He has edema.  October 11, 2015:  Here to discuss stopping the Brilinta in order to get a thyroid bx.   Has been having exertional intrascapular pain with any exertion .  Also has severe DOE. NTG relieves the CP  Tries to eat a low salt diet.  Went to the ER on 1 occasion.   Work up in the ER was negative and he was sent home.  Has severe exertional CP - across the chest  And into the intra scapular region . Relieved to SL NGT.   Past Medical History  Diagnosis Date  . Essential hypertension   . Dyslipidemia   . Coronary artery disease     a. 2001: s/p CABG X4;  b. 08/2010 s/p PCI/BMS to VG->RI->OM;  c. 06/2014 Cath/PCI: LM nl, LAD 130m, LCX 84m, RI 100, OM1 100, OM2 80, small, RCA 100ost, VG->RCA 100, VG->Diag 100, VG->RI->OM1 70-80 ISR (4.0x24 Promus DES), LIMA->LAD nl.  . Morbid obesity (HCC)   . Dysrhythmia   . History of blood  transfusion 2001    "related to OHS"  . Anemia   . GERD (gastroesophageal reflux disease)   . Depression   . Anxiety   . Nephrolithiasis   . Complication of anesthesia     "they have a hard time waking him up"  . Type II diabetes mellitus (HCC)   . Arthritis     "back, neck" (07/11/2014)  . Chronic lower back pain   . Thrombocytopenia (HCC)     a. Borderline low platelets by labs noted in 06/2014 but also noted on prior labs as well.  . Renal insufficiency     a. noted 06/2014 - baseline unclear as no  data since 2012.  Marland Kitchen RBBB     Past Surgical History  Procedure Laterality Date  . Cardiac catheterization  2001; ~ 2010  . Coronary artery bypass graft  2001    "CABG X 5"  . Appendectomy  ~ 1955  . Lumbar laminectomy  01/2011  . Back surgery    . Cholecystectomy    . Shoulder arthroscopy w/ rotator cuff repair Left 1990's  . Cardiovascular stress test  07/03/2009    EF 64%  . Left heart catheterization with coronary/graft angiogram N/A 05/09/2011    Procedure: LEFT HEART CATHETERIZATION WITH Isabel Caprice;  Surgeon: Kathleene Hazel, MD;  Location: [facility] CATH LAB;  Service: Cardiovascular;  Laterality: N/A;  . Tonsillectomy and adenoidectomy  1951  . Left heart catheterization with coronary angiogram N/A 07/11/2014    Procedure: LEFT HEART CATHETERIZATION WITH CORONARY ANGIOGRAM;  Surgeon: Peter M Swaziland, MD;  Location: [facility] CATH LAB;  Service: Cardiovascular;  Laterality: N/A;  . Coronary angioplasty with stent placement  March 2012    BMS to SVG to intermediate/OM  . Coronary angioplasty with stent placement  07/11/2014    "1"     Current Outpatient Prescriptions  Medication Sig Dispense Refill  . amLODipine (NORVASC) 5 MG tablet Take 1 tablet (5 mg total) by mouth daily. 90 tablet 3  . Blood Glucose Monitoring Suppl (FREESTYLE FREEDOM) KIT     . BRILINTA 90 MG TABS tablet TAKE 1 TABLET BY MOUTH 2 TIMES DAILY. 60 tablet 9  . busPIRone (BUSPAR) 15 MG tablet Take 15 mg by mouth daily.    . carvedilol (COREG) 25 MG tablet Take 0.5 tablets (12.5 mg total) by mouth 2 (two) times daily with a meal. 60 tablet 6  . Cholecalciferol (VITAMIN D3) 2000 UNITS TABS Take 2 tablets by mouth daily.      . CYMBALTA 60 MG capsule Take 60 mg by mouth daily.     Marland Kitchen HYDROcodone-acetaminophen (NORCO) 10-325 MG per tablet Take 1-2 tablets by mouth every 6 (six) hours as needed. For pain    . insulin glargine (LANTUS) 100 UNIT/ML injection Inject 64 Units into the skin at bedtime.     .  isosorbide mononitrate (IMDUR) 30 MG 24 hr tablet Take 30 mg by mouth daily.    Marland Kitchen LINZESS 145 MCG CAPS capsule Take 145 mcg by mouth daily.    Marland Kitchen lisinopril (PRINIVIL,ZESTRIL) 5 MG tablet Take 5 mg by mouth daily.    . mirtazapine (REMERON) 15 MG tablet Take 15 mg by mouth at bedtime.     . nitroGLYCERIN (NITROSTAT) 0.4 MG SL tablet Place 1 tablet (0.4 mg total) under the tongue every 5 (five) minutes as needed for chest pain (up to 3 doses). 100 tablet 4  . NOVOLOG FLEXPEN 100 UNIT/ML FlexPen Inject 40-50 Units into the skin 2 (  two) times daily. Use 50 units in the morning and use 40 units in the evening    . QUEtiapine (SEROQUEL) 400 MG tablet Take 400 mg by mouth at bedtime.    Marland Kitchen loperamide (IMODIUM) 2 MG capsule Take 1 capsule (2 mg total) by mouth as needed for diarrhea or loose stools (max upto 16mg /day). (Patient not taking: Reported on 08/23/2015) 60 capsule 0  . promethazine (PHENERGAN) 12.5 MG tablet Take 1 tablet (12.5 mg total) by mouth every 6 (six) hours as needed for nausea or vomiting. (Patient not taking: Reported on 08/23/2015) 30 tablet 0   No current facility-administered medications for this visit.    Allergies:   Metformin and related; Pravachol; Ramipril; and Wellbutrin    Social History:  The patient  reports that he has quit smoking. His smoking use included Cigarettes. He has a 100 pack-year smoking history. He has quit using smokeless tobacco. His smokeless tobacco use included Chew. He reports that he drinks alcohol. He reports that he does not use illicit drugs.   Family History:  The patient's family history includes Heart attack in his father; Heart disease in his brother, father, and mother.    ROS:  Please see the history of present illness.   Otherwise, review of systems are positive for none.   All other systems are reviewed and negative.    PHYSICAL EXAM: VS:  BP 142/80 mmHg  Pulse 84  Ht 6\' 4"  (1.93 m)  Wt 290 lb 12.8 oz (131.906 kg)  BMI 35.41 kg/m2 , BMI  Body mass index is 35.41 kg/(m^2). GEN: Well nourished, well developed, in no acute distress HEENT: normal Neck: no JVD, carotid bruits, or masses Cardiac: RRR; no murmurs, rubs, or gallops, there is 1+ pretibial edema bilaterally. Respiratory:  clear to auscultation bilaterally, normal work of breathing GI: soft, nontender, nondistended, + BS MS: no deformity or atrophy Skin: warm and dry, no rash Neuro:  Strength and sensation are intact Psych: euthymic mood, full affect   EKG:  EKG is not ordered today.    Recent Labs: 08/23/2015: ALT 21; B Natriuretic Peptide 42.0; BUN 20; Creatinine, Ser 1.42*; Hemoglobin 12.7*; Platelets 130*; Potassium 4.7; Sodium 136    Lipid Panel    Component Value Date/Time   CHOL 130 05/09/2011 0505   TRIG 359* 05/09/2011 0505   HDL 27* 05/09/2011 0505   CHOLHDL 4.8 05/09/2011 0505   VLDL 72* 05/09/2011 0505   LDLCALC 31 05/09/2011 0505      Wt Readings from Last 3 Encounters:  10/11/15 290 lb 12.8 oz (131.906 kg)  08/23/15 285 lb (129.275 kg)  07/20/15 291 lb (131.997 kg)        ASSESSMENT AND PLAN: 1. Ischemic heart disease.  History of successful stent to saphenous vein graft ramus/obtuse marginal in January 2016 He's had recurrent angina. He now has intrascapular  pain across his chest with any sort of exertion, climbing stairs or even bathing. This is relieved with sublingual nitroglycerin glycerin. It sounds like he needs another cardiac catheterization. Full set him up for cardiac cath on Monday. We discussed the risks, benefits, and options concerning cardiac cath. He understands and agrees to proceed.  Informed me that his original reason for coming to the office today was to discuss stopping the Brilinta for the purposes of getting a thyroid biopsy. I think we should hold off on this.  We will increase the isosorbide to 60 mg a day.   I'll see him again in 3 months.  2. Status post CABG 3. Right bundle branch block 4.  Depression 5. Dyslipidemia 6. Diabetes mellitus type 2 7. Chronic low back pain 8. Mild renal insufficiency     Current medicines are reviewed at length with the patient today.  The patient does not have concerns regarding medicines.  The following changes have been made:   We emphasized the importance of taking his Brilinta 90 mg twice a day , not once a day.  He is not sure if he is taking isosorbide mononitrate or not. We  him in a prescription for isosorbide mononitrate 30 mg daily.  Labs/ tests ordered today include:  No orders of the defined types were placed in this encounter.      Rishard Delange, Deloris Ping, MD  10/11/2015 5:00 PM    [facility] [address] [place],  [address] [place], [place]  [address] Pager [phone] Phone: [phone]; Fax: [phone]   [facility]  [address] [place], [place]  [address] [phone]   Fax [phone]

## 2015-10-12 LAB — PROTIME-INR
INR: 0.9 (ref <1.50)
Prothrombin Time: 12.2 seconds (ref 11.6–15.2)

## 2015-10-16 ENCOUNTER — Encounter (HOSPITAL_COMMUNITY)
Admission: RE | Disposition: A | Discharge status: Skilled Nursing Facility | Payer: Self-pay | Source: Ambulatory Visit | Attending: Cardiothoracic Surgery

## 2015-10-16 ENCOUNTER — Other Ambulatory Visit: Payer: Self-pay | Admitting: *Deleted

## 2015-10-16 ENCOUNTER — Inpatient Hospital Stay (HOSPITAL_COMMUNITY)
Admission: RE | Admit: 2015-10-16 | Discharge: 2015-11-14 | DRG: 003 | Disposition: A | Discharge status: Skilled Nursing Facility | Payer: Medicare Other | Source: Ambulatory Visit | Attending: Cardiothoracic Surgery | Admitting: Cardiothoracic Surgery

## 2015-10-16 ENCOUNTER — Encounter (HOSPITAL_COMMUNITY): Payer: Self-pay | Admitting: Interventional Cardiology

## 2015-10-16 DIAGNOSIS — Z6837 Body mass index (BMI) 37.0-37.9, adult: Secondary | ICD-10-CM | POA: Diagnosis not present

## 2015-10-16 DIAGNOSIS — K567 Ileus, unspecified: Secondary | ICD-10-CM

## 2015-10-16 DIAGNOSIS — R5381 Other malaise: Secondary | ICD-10-CM | POA: Insufficient documentation

## 2015-10-16 DIAGNOSIS — R400 Somnolence: Secondary | ICD-10-CM | POA: Diagnosis not present

## 2015-10-16 DIAGNOSIS — Z930 Tracheostomy status: Secondary | ICD-10-CM | POA: Diagnosis not present

## 2015-10-16 DIAGNOSIS — I4891 Unspecified atrial fibrillation: Secondary | ICD-10-CM | POA: Diagnosis not present

## 2015-10-16 DIAGNOSIS — Z8249 Family history of ischemic heart disease and other diseases of the circulatory system: Secondary | ICD-10-CM

## 2015-10-16 DIAGNOSIS — N390 Urinary tract infection, site not specified: Secondary | ICD-10-CM | POA: Diagnosis not present

## 2015-10-16 DIAGNOSIS — I2510 Atherosclerotic heart disease of native coronary artery without angina pectoris: Secondary | ICD-10-CM | POA: Diagnosis not present

## 2015-10-16 DIAGNOSIS — D62 Acute posthemorrhagic anemia: Secondary | ICD-10-CM | POA: Insufficient documentation

## 2015-10-16 DIAGNOSIS — J156 Pneumonia due to other aerobic Gram-negative bacteria: Secondary | ICD-10-CM | POA: Diagnosis not present

## 2015-10-16 DIAGNOSIS — G9341 Metabolic encephalopathy: Secondary | ICD-10-CM | POA: Diagnosis not present

## 2015-10-16 DIAGNOSIS — G4733 Obstructive sleep apnea (adult) (pediatric): Secondary | ICD-10-CM | POA: Diagnosis not present

## 2015-10-16 DIAGNOSIS — I10 Essential (primary) hypertension: Secondary | ICD-10-CM | POA: Diagnosis not present

## 2015-10-16 DIAGNOSIS — E1165 Type 2 diabetes mellitus with hyperglycemia: Secondary | ICD-10-CM | POA: Diagnosis present

## 2015-10-16 DIAGNOSIS — J9622 Acute and chronic respiratory failure with hypercapnia: Secondary | ICD-10-CM | POA: Diagnosis not present

## 2015-10-16 DIAGNOSIS — Z9689 Presence of other specified functional implants: Secondary | ICD-10-CM

## 2015-10-16 DIAGNOSIS — Y95 Nosocomial condition: Secondary | ICD-10-CM | POA: Diagnosis not present

## 2015-10-16 DIAGNOSIS — Z955 Presence of coronary angioplasty implant and graft: Secondary | ICD-10-CM

## 2015-10-16 DIAGNOSIS — Z87891 Personal history of nicotine dependence: Secondary | ICD-10-CM | POA: Diagnosis not present

## 2015-10-16 DIAGNOSIS — E079 Disorder of thyroid, unspecified: Secondary | ICD-10-CM | POA: Diagnosis present

## 2015-10-16 DIAGNOSIS — N179 Acute kidney failure, unspecified: Secondary | ICD-10-CM | POA: Diagnosis not present

## 2015-10-16 DIAGNOSIS — Z79899 Other long term (current) drug therapy: Secondary | ICD-10-CM

## 2015-10-16 DIAGNOSIS — J9621 Acute and chronic respiratory failure with hypoxia: Secondary | ICD-10-CM | POA: Diagnosis not present

## 2015-10-16 DIAGNOSIS — E8770 Fluid overload, unspecified: Secondary | ICD-10-CM | POA: Diagnosis not present

## 2015-10-16 DIAGNOSIS — G8929 Other chronic pain: Secondary | ICD-10-CM | POA: Diagnosis present

## 2015-10-16 DIAGNOSIS — R0602 Shortness of breath: Secondary | ICD-10-CM

## 2015-10-16 DIAGNOSIS — Z430 Encounter for attention to tracheostomy: Secondary | ICD-10-CM | POA: Diagnosis not present

## 2015-10-16 DIAGNOSIS — I25709 Atherosclerosis of coronary artery bypass graft(s), unspecified, with unspecified angina pectoris: Secondary | ICD-10-CM | POA: Diagnosis not present

## 2015-10-16 DIAGNOSIS — I25110 Atherosclerotic heart disease of native coronary artery with unstable angina pectoris: Principal | ICD-10-CM | POA: Diagnosis present

## 2015-10-16 DIAGNOSIS — J9601 Acute respiratory failure with hypoxia: Secondary | ICD-10-CM | POA: Diagnosis not present

## 2015-10-16 DIAGNOSIS — Z978 Presence of other specified devices: Secondary | ICD-10-CM

## 2015-10-16 DIAGNOSIS — D696 Thrombocytopenia, unspecified: Secondary | ICD-10-CM | POA: Diagnosis not present

## 2015-10-16 DIAGNOSIS — E785 Hyperlipidemia, unspecified: Secondary | ICD-10-CM | POA: Diagnosis present

## 2015-10-16 DIAGNOSIS — I200 Unstable angina: Secondary | ICD-10-CM | POA: Diagnosis present

## 2015-10-16 DIAGNOSIS — J440 Chronic obstructive pulmonary disease with acute lower respiratory infection: Secondary | ICD-10-CM | POA: Diagnosis present

## 2015-10-16 DIAGNOSIS — I959 Hypotension, unspecified: Secondary | ICD-10-CM | POA: Diagnosis not present

## 2015-10-16 DIAGNOSIS — Z951 Presence of aortocoronary bypass graft: Secondary | ICD-10-CM | POA: Diagnosis not present

## 2015-10-16 DIAGNOSIS — R4182 Altered mental status, unspecified: Secondary | ICD-10-CM

## 2015-10-16 DIAGNOSIS — Z9119 Patient's noncompliance with other medical treatment and regimen: Secondary | ICD-10-CM | POA: Diagnosis not present

## 2015-10-16 DIAGNOSIS — J9589 Other postprocedural complications and disorders of respiratory system, not elsewhere classified: Secondary | ICD-10-CM | POA: Diagnosis not present

## 2015-10-16 DIAGNOSIS — K219 Gastro-esophageal reflux disease without esophagitis: Secondary | ICD-10-CM | POA: Diagnosis present

## 2015-10-16 DIAGNOSIS — T85598A Other mechanical complication of other gastrointestinal prosthetic devices, implants and grafts, initial encounter: Secondary | ICD-10-CM

## 2015-10-16 DIAGNOSIS — F418 Other specified anxiety disorders: Secondary | ICD-10-CM | POA: Diagnosis present

## 2015-10-16 DIAGNOSIS — E669 Obesity, unspecified: Secondary | ICD-10-CM | POA: Insufficient documentation

## 2015-10-16 DIAGNOSIS — Z4659 Encounter for fitting and adjustment of other gastrointestinal appliance and device: Secondary | ICD-10-CM

## 2015-10-16 DIAGNOSIS — M545 Low back pain: Secondary | ICD-10-CM | POA: Diagnosis present

## 2015-10-16 DIAGNOSIS — E872 Acidosis: Secondary | ICD-10-CM | POA: Diagnosis not present

## 2015-10-16 DIAGNOSIS — G9340 Encephalopathy, unspecified: Secondary | ICD-10-CM | POA: Diagnosis not present

## 2015-10-16 DIAGNOSIS — R5082 Postprocedural fever: Secondary | ICD-10-CM | POA: Diagnosis not present

## 2015-10-16 DIAGNOSIS — E876 Hypokalemia: Secondary | ICD-10-CM | POA: Diagnosis present

## 2015-10-16 DIAGNOSIS — B9689 Other specified bacterial agents as the cause of diseases classified elsewhere: Secondary | ICD-10-CM | POA: Diagnosis not present

## 2015-10-16 DIAGNOSIS — J181 Lobar pneumonia, unspecified organism: Secondary | ICD-10-CM

## 2015-10-16 DIAGNOSIS — J189 Pneumonia, unspecified organism: Secondary | ICD-10-CM | POA: Insufficient documentation

## 2015-10-16 DIAGNOSIS — Z9911 Dependence on respirator [ventilator] status: Secondary | ICD-10-CM

## 2015-10-16 DIAGNOSIS — Z794 Long term (current) use of insulin: Secondary | ICD-10-CM

## 2015-10-16 DIAGNOSIS — Z7902 Long term (current) use of antithrombotics/antiplatelets: Secondary | ICD-10-CM

## 2015-10-16 DIAGNOSIS — R001 Bradycardia, unspecified: Secondary | ICD-10-CM | POA: Diagnosis not present

## 2015-10-16 DIAGNOSIS — R451 Restlessness and agitation: Secondary | ICD-10-CM | POA: Diagnosis not present

## 2015-10-16 DIAGNOSIS — N183 Chronic kidney disease, stage 3 unspecified: Secondary | ICD-10-CM

## 2015-10-16 DIAGNOSIS — I4510 Unspecified right bundle-branch block: Secondary | ICD-10-CM | POA: Diagnosis present

## 2015-10-16 DIAGNOSIS — I1310 Hypertensive heart and chronic kidney disease without heart failure, with stage 1 through stage 4 chronic kidney disease, or unspecified chronic kidney disease: Secondary | ICD-10-CM | POA: Diagnosis present

## 2015-10-16 DIAGNOSIS — R0682 Tachypnea, not elsewhere classified: Secondary | ICD-10-CM | POA: Insufficient documentation

## 2015-10-16 DIAGNOSIS — E662 Morbid (severe) obesity with alveolar hypoventilation: Secondary | ICD-10-CM | POA: Diagnosis present

## 2015-10-16 DIAGNOSIS — J9811 Atelectasis: Secondary | ICD-10-CM

## 2015-10-16 DIAGNOSIS — I25118 Atherosclerotic heart disease of native coronary artery with other forms of angina pectoris: Secondary | ICD-10-CM | POA: Diagnosis not present

## 2015-10-16 DIAGNOSIS — Z888 Allergy status to other drugs, medicaments and biological substances status: Secondary | ICD-10-CM | POA: Diagnosis not present

## 2015-10-16 DIAGNOSIS — E1122 Type 2 diabetes mellitus with diabetic chronic kidney disease: Secondary | ICD-10-CM | POA: Diagnosis present

## 2015-10-16 DIAGNOSIS — R2243 Localized swelling, mass and lump, lower limb, bilateral: Secondary | ICD-10-CM | POA: Diagnosis not present

## 2015-10-16 DIAGNOSIS — Z419 Encounter for procedure for purposes other than remedying health state, unspecified: Secondary | ICD-10-CM | POA: Insufficient documentation

## 2015-10-16 DIAGNOSIS — F05 Delirium due to known physiological condition: Secondary | ICD-10-CM | POA: Diagnosis not present

## 2015-10-16 DIAGNOSIS — F4323 Adjustment disorder with mixed anxiety and depressed mood: Secondary | ICD-10-CM | POA: Diagnosis present

## 2015-10-16 DIAGNOSIS — I6521 Occlusion and stenosis of right carotid artery: Secondary | ICD-10-CM | POA: Diagnosis not present

## 2015-10-16 DIAGNOSIS — J9612 Chronic respiratory failure with hypercapnia: Secondary | ICD-10-CM | POA: Diagnosis not present

## 2015-10-16 DIAGNOSIS — E119 Type 2 diabetes mellitus without complications: Secondary | ICD-10-CM

## 2015-10-16 DIAGNOSIS — I25810 Atherosclerosis of coronary artery bypass graft(s) without angina pectoris: Secondary | ICD-10-CM | POA: Diagnosis not present

## 2015-10-16 DIAGNOSIS — R1310 Dysphagia, unspecified: Secondary | ICD-10-CM | POA: Insufficient documentation

## 2015-10-16 DIAGNOSIS — I6523 Occlusion and stenosis of bilateral carotid arteries: Secondary | ICD-10-CM | POA: Diagnosis present

## 2015-10-16 DIAGNOSIS — R401 Stupor: Secondary | ICD-10-CM | POA: Diagnosis not present

## 2015-10-16 DIAGNOSIS — E118 Type 2 diabetes mellitus with unspecified complications: Secondary | ICD-10-CM | POA: Diagnosis not present

## 2015-10-16 DIAGNOSIS — R000 Tachycardia, unspecified: Secondary | ICD-10-CM | POA: Insufficient documentation

## 2015-10-16 HISTORY — PX: CARDIAC CATHETERIZATION: SHX172

## 2015-10-16 LAB — URINALYSIS, ROUTINE W REFLEX MICROSCOPIC
Bilirubin Urine: NEGATIVE
Glucose, UA: NEGATIVE mg/dL
Ketones, ur: NEGATIVE mg/dL
Leukocytes, UA: NEGATIVE
Nitrite: NEGATIVE
Protein, ur: NEGATIVE mg/dL
Specific Gravity, Urine: 1.034 — ABNORMAL HIGH (ref 1.005–1.030)
pH: 5.5 (ref 5.0–8.0)

## 2015-10-16 LAB — SURGICAL PCR SCREEN
MRSA, PCR: NEGATIVE
Staphylococcus aureus: NEGATIVE

## 2015-10-16 LAB — CBC
HEMATOCRIT: 39.3 % (ref 39.0–52.0)
HEMOGLOBIN: 12.8 g/dL — AB (ref 13.0–17.0)
MCH: 29.9 pg (ref 26.0–34.0)
MCHC: 32.6 g/dL (ref 30.0–36.0)
MCV: 91.8 fL (ref 78.0–100.0)
Platelets: 134 10*3/uL — ABNORMAL LOW (ref 150–400)
RBC: 4.28 MIL/uL (ref 4.22–5.81)
RDW: 13.9 % (ref 11.5–15.5)
WBC: 5.4 10*3/uL (ref 4.0–10.5)

## 2015-10-16 LAB — GLUCOSE, CAPILLARY
GLUCOSE-CAPILLARY: 187 mg/dL — AB (ref 65–99)
GLUCOSE-CAPILLARY: 122 mg/dL — AB (ref 65–99)
Glucose-Capillary: 224 mg/dL — ABNORMAL HIGH (ref 65–99)
Glucose-Capillary: 140 mg/dL — ABNORMAL HIGH (ref 65–99)

## 2015-10-16 LAB — URINE MICROSCOPIC-ADD ON

## 2015-10-16 LAB — CREATININE, SERUM
Creatinine, Ser: 1.51 mg/dL — ABNORMAL HIGH (ref 0.61–1.24)
GFR calc Af Amer: 52 mL/min — ABNORMAL LOW (ref >60)
GFR calc non Af Amer: 45 mL/min — ABNORMAL LOW (ref >60)

## 2015-10-16 LAB — APTT: aPTT: 31 seconds (ref 24–37)

## 2015-10-16 SURGERY — LEFT HEART CATH AND CORS/GRAFTS ANGIOGRAPHY
Anesthesia: LOCAL

## 2015-10-16 MED ORDER — QUETIAPINE FUMARATE 200 MG PO TABS
400.0000 mg | ORAL_TABLET | Freq: Every day | ORAL | Status: DC
  Administered 2015-10-16 – 2015-10-19 (×4): 400 mg via ORAL
  Filled 2015-10-16: qty 2
  Filled 2015-10-16 (×2): qty 8
  Filled 2015-10-16 (×3): qty 2

## 2015-10-16 MED ORDER — MIDAZOLAM HCL 2 MG/2ML IJ SOLN
INTRAMUSCULAR | Status: AC
  Filled 2015-10-16: qty 2

## 2015-10-16 MED ORDER — ASPIRIN 81 MG PO CHEW
CHEWABLE_TABLET | ORAL | Status: AC
  Administered 2015-10-16: 81 mg via ORAL
  Filled 2015-10-16: qty 1

## 2015-10-16 MED ORDER — SODIUM CHLORIDE 0.9% FLUSH
3.0000 mL | Freq: Two times a day (BID) | INTRAVENOUS | Status: DC
  Administered 2015-10-16 – 2015-10-18 (×4): 3 mL via INTRAVENOUS

## 2015-10-16 MED ORDER — VERAPAMIL HCL 2.5 MG/ML IV SOLN
INTRAVENOUS | Status: DC | PRN
  Administered 2015-10-16: 10 mL via INTRA_ARTERIAL

## 2015-10-16 MED ORDER — ASPIRIN 81 MG PO CHEW
81.0000 mg | CHEWABLE_TABLET | ORAL | Status: AC
  Administered 2015-10-16: 81 mg via ORAL

## 2015-10-16 MED ORDER — HYDROCODONE-ACETAMINOPHEN 10-325 MG PO TABS
1.0000 | ORAL_TABLET | Freq: Four times a day (QID) | ORAL | Status: DC | PRN
  Administered 2015-10-16 – 2015-10-17 (×3): 2 via ORAL
  Administered 2015-10-19: 1 via ORAL
  Administered 2015-10-19 – 2015-10-28 (×3): 2 via ORAL
  Filled 2015-10-16 (×7): qty 2
  Filled 2015-10-16: qty 1

## 2015-10-16 MED ORDER — SODIUM CHLORIDE 0.9% FLUSH: 3.0000 mL | INTRAVENOUS | Status: DC | PRN

## 2015-10-16 MED ORDER — NITROGLYCERIN 0.4 MG SL SUBL
0.4000 mg | SUBLINGUAL_TABLET | SUBLINGUAL | Status: DC | PRN
  Administered 2015-10-16 – 2015-10-17 (×3): 0.4 mg via SUBLINGUAL
  Filled 2015-10-16 (×3): qty 1

## 2015-10-16 MED ORDER — SODIUM CHLORIDE 0.9 % IV SOLN: 250.0000 mL | INTRAVENOUS | Status: DC | PRN

## 2015-10-16 MED ORDER — INSULIN ASPART 100 UNIT/ML FLEXPEN: 40.0000 [IU] | PEN_INJECTOR | Freq: Two times a day (BID) | SUBCUTANEOUS | Status: DC

## 2015-10-16 MED ORDER — ASPIRIN 81 MG PO CHEW
81.0000 mg | CHEWABLE_TABLET | Freq: Every day | ORAL | Status: DC
  Administered 2015-10-17 – 2015-10-19 (×3): 81 mg via ORAL
  Filled 2015-10-16 (×3): qty 1

## 2015-10-16 MED ORDER — CARVEDILOL 12.5 MG PO TABS
12.5000 mg | ORAL_TABLET | Freq: Two times a day (BID) | ORAL | Status: DC
  Administered 2015-10-16 – 2015-10-19 (×7): 12.5 mg via ORAL
  Filled 2015-10-16 (×7): qty 1

## 2015-10-16 MED ORDER — SODIUM CHLORIDE 0.9 % WEIGHT BASED INFUSION: 1.0000 mL/kg/h | INTRAVENOUS | Status: DC

## 2015-10-16 MED ORDER — FENTANYL CITRATE (PF) 100 MCG/2ML IJ SOLN
INTRAMUSCULAR | Status: DC | PRN
  Administered 2015-10-16: 25 ug via INTRAVENOUS

## 2015-10-16 MED ORDER — MIDAZOLAM HCL 2 MG/2ML IJ SOLN
INTRAMUSCULAR | Status: DC | PRN
  Administered 2015-10-16: 1 mg via INTRAVENOUS

## 2015-10-16 MED ORDER — HEPARIN SODIUM (PORCINE) 1000 UNIT/ML IJ SOLN
INTRAMUSCULAR | Status: AC
  Filled 2015-10-16: qty 1

## 2015-10-16 MED ORDER — LIDOCAINE HCL (PF) 1 % IJ SOLN
INTRAMUSCULAR | Status: DC | PRN
  Administered 2015-10-16: 2 mL via INTRADERMAL

## 2015-10-16 MED ORDER — MIRTAZAPINE 15 MG PO TABS
15.0000 mg | ORAL_TABLET | Freq: Every day | ORAL | Status: DC
  Administered 2015-10-16 – 2015-10-24 (×8): 15 mg via ORAL
  Filled 2015-10-16 (×2): qty 2
  Filled 2015-10-16: qty 1
  Filled 2015-10-16 (×2): qty 2
  Filled 2015-10-16 (×3): qty 1

## 2015-10-16 MED ORDER — HEPARIN (PORCINE) IN NACL 2-0.9 UNIT/ML-% IJ SOLN
INTRAMUSCULAR | Status: AC
  Filled 2015-10-16: qty 1000

## 2015-10-16 MED ORDER — HEPARIN (PORCINE) IN NACL 2-0.9 UNIT/ML-% IJ SOLN
INTRAMUSCULAR | Status: DC | PRN
  Administered 2015-10-16: 1500 mL

## 2015-10-16 MED ORDER — HEPARIN SODIUM (PORCINE) 5000 UNIT/ML IJ SOLN
5000.0000 [IU] | Freq: Three times a day (TID) | INTRAMUSCULAR | Status: DC
  Administered 2015-10-16 – 2015-10-17 (×3): 5000 [IU] via SUBCUTANEOUS
  Filled 2015-10-16 (×3): qty 1

## 2015-10-16 MED ORDER — INSULIN ASPART 100 UNIT/ML ~~LOC~~ SOLN
24.0000 [IU] | Freq: Every day | SUBCUTANEOUS | Status: DC
  Administered 2015-10-16 – 2015-10-19 (×4): 24 [IU] via SUBCUTANEOUS

## 2015-10-16 MED ORDER — HEPARIN (PORCINE) IN NACL 2-0.9 UNIT/ML-% IJ SOLN: INTRAMUSCULAR | Status: DC | PRN

## 2015-10-16 MED ORDER — SODIUM CHLORIDE 0.9 % WEIGHT BASED INFUSION: 1.0000 mL/kg/h | INTRAVENOUS | Status: AC

## 2015-10-16 MED ORDER — SODIUM CHLORIDE 0.9 % WEIGHT BASED INFUSION
3.0000 mL/kg/h | INTRAVENOUS | Status: DC
  Administered 2015-10-16: 3 mL/kg/h via INTRAVENOUS

## 2015-10-16 MED ORDER — AMLODIPINE BESYLATE 5 MG PO TABS
5.0000 mg | ORAL_TABLET | Freq: Every day | ORAL | Status: DC
  Administered 2015-10-16 – 2015-10-19 (×4): 5 mg via ORAL
  Filled 2015-10-16 (×4): qty 1

## 2015-10-16 MED ORDER — DULOXETINE HCL 60 MG PO CPEP
60.0000 mg | ORAL_CAPSULE | Freq: Every day | ORAL | Status: DC
  Administered 2015-10-16 – 2015-11-14 (×21): 60 mg via ORAL
  Filled 2015-10-16 (×29): qty 1

## 2015-10-16 MED ORDER — SODIUM CHLORIDE 0.9% FLUSH
3.0000 mL | INTRAVENOUS | Status: DC | PRN
  Administered 2015-10-16: 3 mL via INTRAVENOUS
  Filled 2015-10-16: qty 3

## 2015-10-16 MED ORDER — HYDROCODONE-ACETAMINOPHEN 5-325 MG PO TABS
ORAL_TABLET | ORAL | Status: AC
  Filled 2015-10-16: qty 1

## 2015-10-16 MED ORDER — HEPARIN SODIUM (PORCINE) 1000 UNIT/ML IJ SOLN
INTRAMUSCULAR | Status: DC | PRN
  Administered 2015-10-16: 6000 [IU] via INTRAVENOUS

## 2015-10-16 MED ORDER — LINACLOTIDE 145 MCG PO CAPS
145.0000 ug | ORAL_CAPSULE | Freq: Every day | ORAL | Status: DC
  Administered 2015-10-17 – 2015-11-12 (×22): 145 ug via ORAL
  Filled 2015-10-16 (×28): qty 1

## 2015-10-16 MED ORDER — LISINOPRIL 10 MG PO TABS
5.0000 mg | ORAL_TABLET | Freq: Every day | ORAL | Status: DC
  Administered 2015-10-16 – 2015-10-19 (×4): 5 mg via ORAL
  Filled 2015-10-16 (×4): qty 1

## 2015-10-16 MED ORDER — LIDOCAINE HCL (PF) 1 % IJ SOLN
INTRAMUSCULAR | Status: AC
  Filled 2015-10-16: qty 30

## 2015-10-16 MED ORDER — IOPAMIDOL (ISOVUE-370) INJECTION 76%
INTRAVENOUS | Status: DC | PRN
  Administered 2015-10-16: 70 mL via INTRAVENOUS

## 2015-10-16 MED ORDER — INSULIN ASPART 100 UNIT/ML ~~LOC~~ SOLN: 40.0000 [IU] | Freq: Every day | SUBCUTANEOUS | Status: DC

## 2015-10-16 MED ORDER — ISOSORBIDE MONONITRATE ER 60 MG PO TB24
60.0000 mg | ORAL_TABLET | Freq: Every day | ORAL | Status: DC
  Administered 2015-10-16: 60 mg via ORAL
  Filled 2015-10-16: qty 1

## 2015-10-16 MED ORDER — ACETAMINOPHEN 325 MG PO TABS
650.0000 mg | ORAL_TABLET | ORAL | Status: DC | PRN
  Administered 2015-10-17 – 2015-10-19 (×2): 650 mg via ORAL
  Filled 2015-10-16 (×2): qty 2

## 2015-10-16 MED ORDER — INSULIN GLARGINE 100 UNIT/ML ~~LOC~~ SOLN
12.0000 [IU] | Freq: Every day | SUBCUTANEOUS | Status: DC
  Administered 2015-10-17 – 2015-10-19 (×3): 12 [IU] via SUBCUTANEOUS
  Filled 2015-10-16 (×4): qty 0.12

## 2015-10-16 MED ORDER — VERAPAMIL HCL 2.5 MG/ML IV SOLN
INTRAVENOUS | Status: AC
  Filled 2015-10-16: qty 2

## 2015-10-16 MED ORDER — INSULIN ASPART 100 UNIT/ML ~~LOC~~ SOLN
24.0000 [IU] | Freq: Every day | SUBCUTANEOUS | Status: DC
  Administered 2015-10-17 – 2015-10-19 (×3): 24 [IU] via SUBCUTANEOUS

## 2015-10-16 MED ORDER — LIRAGLUTIDE 18 MG/3ML ~~LOC~~ SOPN
1.2000 mg | PEN_INJECTOR | Freq: Every day | SUBCUTANEOUS | Status: DC
Start: 2015-10-17 — End: 2015-11-14
  Administered 2015-10-17 – 2015-11-14 (×24): 1.2 mg via SUBCUTANEOUS
  Filled 2015-10-16 (×6): qty 3

## 2015-10-16 MED ORDER — INSULIN ASPART 100 UNIT/ML ~~LOC~~ SOLN: 50.0000 [IU] | Freq: Every day | SUBCUTANEOUS | Status: DC

## 2015-10-16 MED ORDER — FENTANYL CITRATE (PF) 100 MCG/2ML IJ SOLN
INTRAMUSCULAR | Status: AC
Start: 2015-10-16 — End: 2015-10-16
  Filled 2015-10-16: qty 2

## 2015-10-16 MED ORDER — BUSPIRONE HCL 15 MG PO TABS
15.0000 mg | ORAL_TABLET | Freq: Every day | ORAL | Status: DC
  Administered 2015-10-16 – 2015-11-14 (×29): 15 mg via ORAL
  Filled 2015-10-16 (×8): qty 1
  Filled 2015-10-16: qty 3
  Filled 2015-10-16 (×3): qty 1
  Filled 2015-10-16: qty 3
  Filled 2015-10-16 (×13): qty 1
  Filled 2015-10-16: qty 3
  Filled 2015-10-16 (×6): qty 1

## 2015-10-16 MED ORDER — INSULIN GLARGINE 100 UNIT/ML ~~LOC~~ SOLN
64.0000 [IU] | Freq: Every day | SUBCUTANEOUS | Status: DC
  Administered 2015-10-16 – 2015-10-19 (×4): 64 [IU] via SUBCUTANEOUS
  Filled 2015-10-16 (×6): qty 0.64

## 2015-10-16 MED ORDER — ONDANSETRON HCL 4 MG/2ML IJ SOLN
4.0000 mg | Freq: Four times a day (QID) | INTRAMUSCULAR | Status: DC | PRN
Start: 2015-10-16 — End: 2015-11-14
  Filled 2015-10-16 (×2): qty 2

## 2015-10-16 MED ORDER — SODIUM CHLORIDE 0.9% FLUSH: 3.0000 mL | Freq: Two times a day (BID) | INTRAVENOUS | Status: DC

## 2015-10-16 SURGICAL SUPPLY — 10 items
CATH INFINITI 5 FR IM (CATHETERS) ×1 IMPLANT
CATH INFINITI 5FR MULTPACK ANG (CATHETERS) ×1 IMPLANT
CATH SOFT-VU ST 4F 90CM (CATHETERS) ×1 IMPLANT
DEVICE RAD COMP TR BAND LRG (VASCULAR PRODUCTS) ×1 IMPLANT
GLIDESHEATH SLEND SS 6F .021 (SHEATH) ×1 IMPLANT
KIT HEART LEFT (KITS) ×2 IMPLANT
PACK CARDIAC CATHETERIZATION (CUSTOM PROCEDURE TRAY) ×2 IMPLANT
TRANSDUCER W/STOPCOCK (MISCELLANEOUS) ×2 IMPLANT
TUBING CIL FLEX 10 FLL-RA (TUBING) ×2 IMPLANT
WIRE SAFE-T 1.5MM-J .035X260CM (WIRE) ×1 IMPLANT
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
## 2015-10-16 NOTE — Progress Notes (Signed)
Eating [place] sandwich meal. Family in to see

## 2015-10-16 NOTE — Progress Notes (Signed)
TR BAND REMOVAL  LOCATION:    Radial lt radial  DEFLATED PER PROTOCOL:   yes  TIME BAND OFF / DRESSING APPLIED:    1310, small tegaderm applied  SITE UPON ARRIVAL:    Level 0  SITE AFTER BAND REMOVAL:    Level 0  CIRCULATION SENSATION AND MOVEMENT:    Within Normal Limits : yes, lt radial 2+, sensation present  COMMENTS:

## 2015-10-16 NOTE — Progress Notes (Signed)
Patient called for RN. Stated he was awoken from sleep by pain in his middle upper back, right between his shoulder blades and down his left arm.  Patient described the pain as burning and stating it "just hurts real bad".  Patient rating pain 9/10.  Patient also stated he had a headache.  Patient denies shortness of breath and or nausea.  Blood pressure taken, see blood pressure at 2327.  EKG completed, copy placed in hard chart.  RN administered one sublingual nitroglycerin.  Patient has an order for Norco.  RN asked patient if he wanted to take Norco for his headache and patient stated yes.  Per order patient allowed one or two tablets, patient stated he wanted two.  PRN Norco administered per MD order.  After one sublingual nitroglycerin administered and dissolved patient rating pain 4/10.  Second nitroglycerin sublingual administered.  Oxygen applied at 2L nasal cannula.  Patient asked to lay back down in bed.  RN stayed at bedside for a couple minutes and patient asleep.

## 2015-10-16 NOTE — H&P (View-Only) (Signed)
Cardiology Office Note   Date:  10/11/2015   ID:  GAYLORD CARRINO, DOB [birth year], MRN [id number]  PCP:  Kaleen Mask, MD  Cardiologist: Cassell Clement MD  Chief Complaint  Patient presents with  . Hypertension   Problem list 1. Coronary artery disease-status post coronary artery bypass grafting 2. Hypertension 3. Diabetes mellitus 4.  Dyslipidemia   History of Present Illness: TIMARI SUCHANEK is a 70 y.o. male who presents for  Scheduled six-month follow-up visit  JAMARLON HENSLER is a 70 y.o. male who presents for follow-up office visit He has a history of known ischemic heart disease. He underwent coronary artery bypass graft surgery in 2001. In March 2012 he underwent bare-metal stent placement to the saphenous vein graft to the intermediate and obtuse marginal.  The patient was seen in December 2015 complaining of more frequent chest pain The patient had a Lexiscan Myoview stress test on 06/29/14 which showed Intermediate risk stress nuclear study demonstrating a mostly reversible defect along the mid to distal anterior wall/apical wall distribution. 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 Cardiac catheterization was recommended. On 07/11/14 patient underwent cardiac catheterization by Dr. Peter Swaziland with the following findings: 1. Severe 3 vessel obstructive CAD 2. Patent LIMA to the LAD. 3. Restenosis in the stent in SVG to ramus/OM 4. Occluded SVG to diagonal 5. Occluded SVG to RCA 6. Successful stenting of the SVG to ramus/OM ostium with a DES.   Recommendations:  Continue DAPT indefinitely if possible. Needs to take at least one year.  Since last visit the patient has been doing reasonably well. His blood pressure has been running low yesterday and today. He has been feeling more fatigued. He has not had any syncope. He has occasional chest pain and takes sublingual nitroglycerin with improvement. He continues to  have problems with anxiety attacks, particularly at night when he lies down to go to sleep.   the patient comes in today for a scheduled follow-up visit. He states that he is not taking aspirin. And he is only taking the Brilinta once a day. He has been having some shortness of breath. He fell last week and injured his left shoulder.  Since then he is also been having some left shoulder discomfort. He has been taking occasional sublingual nitroglycerin for chest pain. He has not been careful with his diet and his weight is up. He has been eating salty food. He has edema.  October 11, 2015:  Here to discuss stopping the Brilinta in order to get a thyroid bx.   Has been having exertional intrascapular pain with any exertion .  Also has severe DOE. NTG relieves the CP  Tries to eat a low salt diet.  Went to the ER on 1 occasion.   Work up in the ER was negative and he was sent home.  Has severe exertional CP - across the chest  And into the intra scapular region . Relieved to SL NGT.   Past Medical History  Diagnosis Date  . Essential hypertension   . Dyslipidemia   . Coronary artery disease     a. 2001: s/p CABG X4;  b. 08/2010 s/p PCI/BMS to VG->RI->OM;  c. 06/2014 Cath/PCI: LM nl, LAD 130m, LCX 84m, RI 100, OM1 100, OM2 80, small, RCA 100ost, VG->RCA 100, VG->Diag 100, VG->RI->OM1 70-80 ISR (4.0x24 Promus DES), LIMA->LAD nl.  . Morbid obesity (HCC)   . Dysrhythmia   . History of blood  transfusion 2001    "related to OHS"  . Anemia   . GERD (gastroesophageal reflux disease)   . Depression   . Anxiety   . Nephrolithiasis   . Complication of anesthesia     "they have a hard time waking him up"  . Type II diabetes mellitus (HCC)   . Arthritis     "back, neck" (07/11/2014)  . Chronic lower back pain   . Thrombocytopenia (HCC)     a. Borderline low platelets by labs noted in 06/2014 but also noted on prior labs as well.  . Renal insufficiency     a. noted 06/2014 - baseline unclear as no  data since 2012.  Marland Kitchen RBBB     Past Surgical History  Procedure Laterality Date  . Cardiac catheterization  2001; ~ 2010  . Coronary artery bypass graft  2001    "CABG X 5"  . Appendectomy  ~ 1955  . Lumbar laminectomy  01/2011  . Back surgery    . Cholecystectomy    . Shoulder arthroscopy w/ rotator cuff repair Left 1990's  . Cardiovascular stress test  07/03/2009    EF 64%  . Left heart catheterization with coronary/graft angiogram N/A 05/09/2011    Procedure: LEFT HEART CATHETERIZATION WITH Isabel Caprice;  Surgeon: Kathleene Hazel, MD;  Location: [facility] CATH LAB;  Service: Cardiovascular;  Laterality: N/A;  . Tonsillectomy and adenoidectomy  1951  . Left heart catheterization with coronary angiogram N/A 07/11/2014    Procedure: LEFT HEART CATHETERIZATION WITH CORONARY ANGIOGRAM;  Surgeon: Peter M Swaziland, MD;  Location: [facility] CATH LAB;  Service: Cardiovascular;  Laterality: N/A;  . Coronary angioplasty with stent placement  March 2012    BMS to SVG to intermediate/OM  . Coronary angioplasty with stent placement  07/11/2014    "1"     Current Outpatient Prescriptions  Medication Sig Dispense Refill  . amLODipine (NORVASC) 5 MG tablet Take 1 tablet (5 mg total) by mouth daily. 90 tablet 3  . Blood Glucose Monitoring Suppl (FREESTYLE FREEDOM) KIT     . BRILINTA 90 MG TABS tablet TAKE 1 TABLET BY MOUTH 2 TIMES DAILY. 60 tablet 9  . busPIRone (BUSPAR) 15 MG tablet Take 15 mg by mouth daily.    . carvedilol (COREG) 25 MG tablet Take 0.5 tablets (12.5 mg total) by mouth 2 (two) times daily with a meal. 60 tablet 6  . Cholecalciferol (VITAMIN D3) 2000 UNITS TABS Take 2 tablets by mouth daily.      . CYMBALTA 60 MG capsule Take 60 mg by mouth daily.     Marland Kitchen HYDROcodone-acetaminophen (NORCO) 10-325 MG per tablet Take 1-2 tablets by mouth every 6 (six) hours as needed. For pain    . insulin glargine (LANTUS) 100 UNIT/ML injection Inject 64 Units into the skin at bedtime.     .  isosorbide mononitrate (IMDUR) 30 MG 24 hr tablet Take 30 mg by mouth daily.    Marland Kitchen LINZESS 145 MCG CAPS capsule Take 145 mcg by mouth daily.    Marland Kitchen lisinopril (PRINIVIL,ZESTRIL) 5 MG tablet Take 5 mg by mouth daily.    . mirtazapine (REMERON) 15 MG tablet Take 15 mg by mouth at bedtime.     . nitroGLYCERIN (NITROSTAT) 0.4 MG SL tablet Place 1 tablet (0.4 mg total) under the tongue every 5 (five) minutes as needed for chest pain (up to 3 doses). 100 tablet 4  . NOVOLOG FLEXPEN 100 UNIT/ML FlexPen Inject 40-50 Units into the skin 2 (  two) times daily. Use 50 units in the morning and use 40 units in the evening    . QUEtiapine (SEROQUEL) 400 MG tablet Take 400 mg by mouth at bedtime.    Marland Kitchen loperamide (IMODIUM) 2 MG capsule Take 1 capsule (2 mg total) by mouth as needed for diarrhea or loose stools (max upto 16mg /day). (Patient not taking: Reported on 08/23/2015) 60 capsule 0  . promethazine (PHENERGAN) 12.5 MG tablet Take 1 tablet (12.5 mg total) by mouth every 6 (six) hours as needed for nausea or vomiting. (Patient not taking: Reported on 08/23/2015) 30 tablet 0   No current facility-administered medications for this visit.    Allergies:   Metformin and related; Pravachol; Ramipril; and Wellbutrin    Social History:  The patient  reports that he has quit smoking. His smoking use included Cigarettes. He has a 100 pack-year smoking history. He has quit using smokeless tobacco. His smokeless tobacco use included Chew. He reports that he drinks alcohol. He reports that he does not use illicit drugs.   Family History:  The patient's family history includes Heart attack in his father; Heart disease in his brother, father, and mother.    ROS:  Please see the history of present illness.   Otherwise, review of systems are positive for none.   All other systems are reviewed and negative.    PHYSICAL EXAM: VS:  BP 142/80 mmHg  Pulse 84  Ht 6\' 4"  (1.93 m)  Wt 290 lb 12.8 oz (131.906 kg)  BMI 35.41 kg/m2 , BMI  Body mass index is 35.41 kg/(m^2). GEN: Well nourished, well developed, in no acute distress HEENT: normal Neck: no JVD, carotid bruits, or masses Cardiac: RRR; no murmurs, rubs, or gallops, there is 1+ pretibial edema bilaterally. Respiratory:  clear to auscultation bilaterally, normal work of breathing GI: soft, nontender, nondistended, + BS MS: no deformity or atrophy Skin: warm and dry, no rash Neuro:  Strength and sensation are intact Psych: euthymic mood, full affect   EKG:  EKG is not ordered today.    Recent Labs: 08/23/2015: ALT 21; B Natriuretic Peptide 42.0; BUN 20; Creatinine, Ser 1.42*; Hemoglobin 12.7*; Platelets 130*; Potassium 4.7; Sodium 136    Lipid Panel    Component Value Date/Time   CHOL 130 05/09/2011 0505   TRIG 359* 05/09/2011 0505   HDL 27* 05/09/2011 0505   CHOLHDL 4.8 05/09/2011 0505   VLDL 72* 05/09/2011 0505   LDLCALC 31 05/09/2011 0505      Wt Readings from Last 3 Encounters:  10/11/15 290 lb 12.8 oz (131.906 kg)  08/23/15 285 lb (129.275 kg)  07/20/15 291 lb (131.997 kg)        ASSESSMENT AND PLAN: 1. Ischemic heart disease.  History of successful stent to saphenous vein graft ramus/obtuse marginal in January 2016 He's had recurrent angina. He now has intrascapular  pain across his chest with any sort of exertion, climbing stairs or even bathing. This is relieved with sublingual nitroglycerin glycerin. It sounds like he needs another cardiac catheterization. Full set him up for cardiac cath on Monday. We discussed the risks, benefits, and options concerning cardiac cath. He understands and agrees to proceed.  Informed me that his original reason for coming to the office today was to discuss stopping the Brilinta for the purposes of getting a thyroid biopsy. I think we should hold off on this.  We will increase the isosorbide to 60 mg a day.   I'll see him again in 3 months.  2. Status post CABG 3. Right bundle branch block 4.  Depression 5. Dyslipidemia 6. Diabetes mellitus type 2 7. Chronic low back pain 8. Mild renal insufficiency     Current medicines are reviewed at length with the patient today.  The patient does not have concerns regarding medicines.  The following changes have been made:   We emphasized the importance of taking his Brilinta 90 mg twice a day , not once a day.  He is not sure if he is taking isosorbide mononitrate or not. We  him in a prescription for isosorbide mononitrate 30 mg daily.  Labs/ tests ordered today include:  No orders of the defined types were placed in this encounter.      Rishard Delange, Deloris Ping, MD  10/11/2015 5:00 PM    [facility] [address] [place],  [address] [place], [place]  [address] Pager [phone] Phone: [phone]; Fax: [phone]   [facility]  [address] [place], [place]  [address] [phone]   Fax [phone]

## 2015-10-16 NOTE — Interval H&P Note (Signed)
Cath Lab Visit (complete for each Cath Lab visit)  Clinical Evaluation Leading to the Procedure:   ACS: No.  Non-ACS:    Anginal Classification: CCS III  Anti-ischemic medical therapy: Minimal Therapy (1 class of medications)  Non-Invasive Test Results: No non-invasive testing performed  Prior CABG: Previous CABG      History and Physical Interval Note:  10/16/2015 9:07 AM  Tyler Le  has presented today for surgery, with the diagnosis of CAD with unstable angina  The various methods of treatment have been discussed with the patient and family. After consideration of risks, benefits and other options for treatment, the patient has consented to  Procedure(s): Left Heart Cath and Cors/Grafts Angiography (N/A) as a surgical intervention .  The patient's history has been reviewed, patient examined, no change in status, stable for surgery.  I have reviewed the patient's chart and labs.  Questions were answered to the patient's satisfaction.     Tyler Le S.

## 2015-10-17 ENCOUNTER — Inpatient Hospital Stay (HOSPITAL_COMMUNITY): Payer: Medicare Other

## 2015-10-17 DIAGNOSIS — N183 Chronic kidney disease, stage 3 unspecified: Secondary | ICD-10-CM

## 2015-10-17 DIAGNOSIS — I2510 Atherosclerotic heart disease of native coronary artery without angina pectoris: Secondary | ICD-10-CM

## 2015-10-17 DIAGNOSIS — E785 Hyperlipidemia, unspecified: Secondary | ICD-10-CM

## 2015-10-17 DIAGNOSIS — I200 Unstable angina: Secondary | ICD-10-CM

## 2015-10-17 LAB — PULMONARY FUNCTION TEST
DL/VA % pred: 92 %
DL/VA: 4.38 ml/min/mmHg/L
DLCO cor % pred: 42 %
DLCO cor: 15.61 ml/min/mmHg
DLCO unc % pred: 40 %
DLCO unc: 14.75 ml/min/mmHg
FEF 25-75 Post: 1.46 L/sec
FEF 25-75 Pre: 1.5 L/sec
FEF2575-%Change-Post: -2 %
FEF2575-%Pred-Post: 54 %
FEF2575-%Pred-Pre: 56 %
FEV1-%Change-Post: -3 %
FEV1-%Pred-Post: 44 %
FEV1-%Pred-Pre: 46 %
FEV1-Post: 1.59 L
FEV1-Pre: 1.65 L
FEV1FVC-%Change-Post: -2 %
FEV1FVC-%Pred-Pre: 107 %
FEV6-%Change-Post: -1 %
FEV6-%Pred-Post: 45 %
FEV6-%Pred-Pre: 45 %
FEV6-Post: 2.07 L
FEV6-Pre: 2.1 L
FEV6FVC-%Pred-Post: 106 %
FEV6FVC-%Pred-Pre: 106 %
FVC-%Change-Post: -1 %
FVC-%Pred-Post: 42 %
FVC-%Pred-Pre: 43 %
FVC-Post: 2.07 L
FVC-Pre: 2.1 L
Post FEV1/FVC ratio: 77 %
Post FEV6/FVC ratio: 100 %
Pre FEV1/FVC ratio: 78 %
Pre FEV6/FVC Ratio: 100 %
RV % pred: 85 %
RV: 2.27 L
TLC % pred: 59 %
TLC: 4.52 L

## 2015-10-17 LAB — BLOOD GAS, ARTERIAL
Acid-base deficit: 1.6 mmol/L (ref 0.0–2.0)
Bicarbonate: 24.6 mEq/L — ABNORMAL HIGH (ref 20.0–24.0)
Drawn by: 36277
O2 Content: 2 L/min
O2 Saturation: 93.9 %
Patient temperature: 98.6
TCO2: 26.3 mmol/L (ref 0–100)
pCO2 arterial: 57.4 mmHg (ref 35.0–45.0)
pH, Arterial: 7.255 — ABNORMAL LOW (ref 7.350–7.450)
pO2, Arterial: 80.9 mmHg (ref 80.0–100.0)

## 2015-10-17 LAB — HEPARIN LEVEL (UNFRACTIONATED)
HEPARIN UNFRACTIONATED: 0.46 [IU]/mL (ref 0.30–0.70)
HEPARIN UNFRACTIONATED: 0.44 [IU]/mL (ref 0.30–0.70)

## 2015-10-17 LAB — BASIC METABOLIC PANEL
Anion gap: 10 (ref 5–15)
BUN: 18 mg/dL (ref 6–20)
CALCIUM: 8.8 mg/dL — AB (ref 8.9–10.3)
CO2: 26 mmol/L (ref 22–32)
Chloride: 101 mmol/L (ref 101–111)
Creatinine, Ser: 1.5 mg/dL — ABNORMAL HIGH (ref 0.61–1.24)
GFR calc Af Amer: 52 mL/min — ABNORMAL LOW (ref >60)
GFR, EST NON AFRICAN AMERICAN: 45 mL/min — AB (ref >60)
GLUCOSE: 152 mg/dL — AB (ref 65–99)
Potassium: 4.5 mmol/L (ref 3.5–5.1)
SODIUM: 137 mmol/L (ref 135–145)

## 2015-10-17 LAB — GLUCOSE, CAPILLARY
GLUCOSE-CAPILLARY: 150 mg/dL — AB (ref 65–99)
GLUCOSE-CAPILLARY: 96 mg/dL (ref 65–99)
Glucose-Capillary: 139 mg/dL — ABNORMAL HIGH (ref 65–99)
Glucose-Capillary: 219 mg/dL — ABNORMAL HIGH (ref 65–99)

## 2015-10-17 LAB — ECHOCARDIOGRAM COMPLETE
HEIGHTINCHES: 76 in
Weight: 4560 oz

## 2015-10-17 LAB — PLATELET INHIBITION P2Y12: Platelet Function  P2Y12: 225 [PRU] (ref 194–418)

## 2015-10-17 LAB — PROTIME-INR
INR: 1.03 (ref 0.00–1.49)
Prothrombin Time: 13.7 seconds (ref 11.6–15.2)

## 2015-10-17 LAB — TSH: TSH: 6.676 u[IU]/mL — ABNORMAL HIGH (ref 0.350–4.500)

## 2015-10-17 MED ORDER — PERFLUTREN LIPID MICROSPHERE
1.0000 mL | INTRAVENOUS | Status: AC | PRN
  Filled 2015-10-17: qty 10

## 2015-10-17 MED ORDER — PERFLUTREN LIPID MICROSPHERE
INTRAVENOUS | Status: AC
  Administered 2015-10-17: 2 mL
  Filled 2015-10-17: qty 10

## 2015-10-17 MED ORDER — HEPARIN (PORCINE) IN NACL 100-0.45 UNIT/ML-% IJ SOLN
1600.0000 [IU]/h | INTRAMUSCULAR | Status: DC
  Administered 2015-10-17 – 2015-10-20 (×3): 1600 [IU]/h via INTRAVENOUS
  Filled 2015-10-17 (×5): qty 250

## 2015-10-17 MED ORDER — ALBUTEROL SULFATE (2.5 MG/3ML) 0.083% IN NEBU
2.5000 mg | INHALATION_SOLUTION | Freq: Once | RESPIRATORY_TRACT | Status: AC
  Administered 2015-10-17: 2.5 mg via RESPIRATORY_TRACT

## 2015-10-17 MED ORDER — NITROGLYCERIN IN D5W 200-5 MCG/ML-% IV SOLN
2.0000 ug/min | INTRAVENOUS | Status: DC
  Administered 2015-10-17: 25 ug/min via INTRAVENOUS
  Administered 2015-10-17: 20 ug/min via INTRAVENOUS
  Administered 2015-10-17: 15 ug/min via INTRAVENOUS
  Administered 2015-10-17: 5 ug/min via INTRAVENOUS
  Administered 2015-10-17: 10 ug/min via INTRAVENOUS
  Administered 2015-10-20: 15 ug/min via INTRAVENOUS
  Filled 2015-10-17 (×3): qty 250

## 2015-10-17 MED ORDER — EZETIMIBE 10 MG PO TABS
10.0000 mg | ORAL_TABLET | Freq: Every day | ORAL | Status: DC
  Administered 2015-10-17 – 2015-11-14 (×28): 10 mg via ORAL
  Filled 2015-10-17 (×28): qty 1

## 2015-10-17 MED ORDER — POLYETHYLENE GLYCOL 3350 17 G PO PACK
17.0000 g | PACK | Freq: Once | ORAL | Status: AC
  Administered 2015-10-17: 17 g via ORAL
  Filled 2015-10-17: qty 1

## 2015-10-17 NOTE — Progress Notes (Signed)
Patient currently rating pain 1/10.  RN updated Onalee HuaAlvarez on call with Cardiology with this information via Amion.

## 2015-10-17 NOTE — Progress Notes (Signed)
Patient complaint of 8/10 burning chest pain between his shoulder blades. Pt sbp in 170's. Titrated nitro gtt per protocol to 25 mcg. Obtained EKG, Paged NP. Chest Pain now resolved. Tylenol given for headache. Will continue to closely monitor.

## 2015-10-17 NOTE — Progress Notes (Signed)
Discussed sternal precautions, mobility, IS, and d/c planning with pt and wife. Gave OHS booklet, careguide, and video to watch. Pt not very interested in watching preop video. Will hold on ambulation as pt had dynamic EKG last night with CP and now on IV NTG. Encouraged room mobility as able. Will f/u after surgery. [id number] Ethelda ChickKristan Kevia Zaucha CES, ACSM 11:08 AM 10/17/2015

## 2015-10-17 NOTE — Progress Notes (Addendum)
  Echocardiogram 2D Echocardiogram with Definity has been performed.  Leta JunglingCooper, Abiola Behring M 10/17/2015, 10:49 AM

## 2015-10-17 NOTE — Progress Notes (Addendum)
Went to round on patient, he had turned off his Nitro gtt at the pump. Restarting nitro gtt at 5. No complaints of chest pain at this time. Will continue to monitor.

## 2015-10-17 NOTE — Progress Notes (Signed)
ANTICOAGULATION CONSULT NOTE - Initial Consult  Pharmacy Consult for Heparin Indication: chest pain/ACS, planning redo CABG Friday  Allergies  Allergen Reactions  . Metformin And Related Nausea And Vomiting  . Pravachol Nausea And Vomiting  . Ramipril Nausea Only  . Wellbutrin [Bupropion Hcl] Nausea And Vomiting    Patient Measurements: Height: 6\' 4"  (193 cm) Weight:  (Patient stated that he wanted to weigh later not right now) IBW/kg (Calculated) : 86.8 Heparin Dosing Weight: 117  Vital Signs: Temp: 98.1 F (36.7 C) (04/25 0500) Temp Source: Oral (04/25 0500) BP: 138/69 mmHg (04/25 0710) Pulse Rate: 93 (04/25 0500)  Labs:  Recent Labs  10/16/15 1410 10/16/15 2028 10/17/15 0657  HGB 12.8*  --   --   HCT 39.3  --   --   PLT 134*  --   --   APTT  --  31  --   LABPROT  --   --  13.7  INR  --   --  1.03  CREATININE 1.51*  --  1.50*    Estimated Creatinine Clearance: 66.3 mL/min (by C-G formula based on Cr of 1.5).   Medical History: Past Medical History  Diagnosis Date  . Essential hypertension   . Dyslipidemia   . Coronary artery disease     a. 2001: s/p CABG X4;  b. 08/2010 s/p PCI/BMS to VG->RI->OM;  c. 06/2014 Cath/PCI: LM nl, LAD 1399m, LCX 6456m, RI 100, OM1 100, OM2 80, small, RCA 100ost, VG->RCA 100, VG->Diag 100, VG->RI->OM1 70-80 ISR (4.0x24 Promus DES), LIMA->LAD nl.  . Morbid obesity (HCC)   . Dysrhythmia   . History of blood transfusion 2001    "related to OHS"  . Anemia   . GERD (gastroesophageal reflux disease)   . Depression   . Anxiety   . Nephrolithiasis   . Complication of anesthesia     "they have a hard time waking him up"  . Type II diabetes mellitus (HCC)   . Arthritis     "back, neck" (07/11/2014)  . Chronic lower back pain   . Thrombocytopenia (HCC)     a. Borderline low platelets by labs noted in 06/2014 but also noted on prior labs as well.  . Renal insufficiency     a. noted 06/2014 - baseline unclear as no data since 2012.  Marland Kitchen.  RBBB     Assessment: 70 year old male s/p cath with plans for redo CABG on Friday after Brilinta washout  Goal of Therapy:  Heparin level 0.3-0.7 units/ml Monitor platelets by anticoagulation protocol: Yes   Plan:  Heparin drip at 1600 units / hr 8 hour heparin level Daily heparin level, CBC  Thank you. Okey RegalLisa Alysia Scism, PharmD [phone]  Elwin SleightPowell, Dannon Nguyenthi Kay 10/17/2015,8:51 AM

## 2015-10-17 NOTE — Progress Notes (Signed)
ANTICOAGULATION CONSULT NOTE  Pharmacy Consult for Heparin Indication: chest pain/ACS, planning redo CABG Friday  Allergies  Allergen Reactions  . Metformin And Related Nausea And Vomiting  . Pravachol Nausea And Vomiting  . Ramipril Nausea Only  . Wellbutrin [Bupropion Hcl] Nausea And Vomiting    Patient Measurements: Height: 6\' 4"  (193 cm) Weight:  (Patient stated that he wanted to weigh later not right now) IBW/kg (Calculated) : 86.8 Heparin Dosing Weight: 117  Vital Signs: Temp: 97.9 F (36.6 C) (04/25 2004) Temp Source: Oral (04/25 2004) BP: 146/70 mmHg (04/25 2015) Pulse Rate: 92 (04/25 2015)  Labs:  Recent Labs  10/16/15 1410 10/16/15 2028 10/17/15 0657 10/17/15 1431 10/17/15 2150  HGB 12.8*  --   --   --   --   HCT 39.3  --   --   --   --   PLT 134*  --   --   --   --   APTT  --  31  --   --   --   LABPROT  --   --  13.7  --   --   INR  --   --  1.03  --   --   HEPARINUNFRC  --   --   --  0.46 0.44  CREATININE 1.51*  --  1.50*  --   --     Estimated Creatinine Clearance: 66.3 mL/min (by C-G formula based on Cr of 1.5).    Assessment: 70 year old male s/p cath with plans for redo CABG on Friday after Brilinta washout -heparin level is confirmed at goal (HL= 0.44)  Goal of Therapy:  Heparin level 0.3-0.7 units/ml Monitor platelets by anticoagulation protocol: Yes   Plan:  No heparin changes needed Daily heparin level, CBC  Harland GermanAndrew Kellon Chalk, Pharm D 10/17/2015 10:43 PM

## 2015-10-17 NOTE — Progress Notes (Signed)
Cardiology paged via Amion with results of arterial blood gas that was drawn this morning.

## 2015-10-17 NOTE — Progress Notes (Signed)
Patient Name: Tyler Le Date of Encounter: 10/17/2015  Hospital Problem List     Principal Problem:   Accelerating angina [facility]) Active Problems:   S/P CABG (coronary artery bypass graft)   CAD (coronary artery disease)   Unstable angina (HCC)   Type II diabetes mellitus (HCC)   CKD (chronic kidney disease), stage III   Dyslipidemia    Subjective   Had several episodes of c/p overnight.  Now on IV ntg and c/p free since.  Inpatient Medications    . amLODipine  5 mg Oral Daily  . aspirin  81 mg Oral Daily  . busPIRone  15 mg Oral Daily  . carvedilol  12.5 mg Oral BID WC  . DULoxetine  60 mg Oral Daily  . insulin aspart  24 Units Subcutaneous Q supper  . insulin aspart  24 Units Subcutaneous Q breakfast  . insulin glargine  12 Units Subcutaneous Daily  . insulin glargine  64 Units Subcutaneous QHS  . linaclotide  145 mcg Oral Daily  . Liraglutide  1.2 mg Subcutaneous Daily  . lisinopril  5 mg Oral Daily  . mirtazapine  15 mg Oral QHS  . QUEtiapine  400 mg Oral QHS  . sodium chloride flush  3 mL Intravenous Q12H    Vital Signs    Filed Vitals:   10/17/15 0500 10/17/15 0640 10/17/15 0650 10/17/15 0710  BP: 94/50 190/107 166/92 138/69  Pulse: 93     Temp: 98.1 F (36.7 C)     TempSrc: Oral     Resp:      Height:      Weight:      SpO2: 96%       Intake/Output Summary (Last 24 hours) at 10/17/15 0957 Last data filed at 10/17/15 0900  Gross per 24 hour  Intake 1013.03 ml  Output    650 ml  Net 363.03 ml   Filed Weights   10/16/15 0720  Weight: 285 lb (129.275 kg)    Physical Exam    General: Pleasant, NAD. Neuro: Alert and oriented X 3. Moves all extremities spontaneously. Psych: Normal affect. HEENT:  Normal  Neck: Supple without bruits.  Obese.  Difficult to gauge jvp. Lungs:  Resp regular and unlabored, CTA. Heart: RRR, distant, no s3, s4, or murmurs. Abdomen: Soft, non-tender, non-distended, BS + x 4.  Extremities: No clubbing, cyanosis  or edema. DP/PT/Radials 2+ and equal bilaterally.  Cath site w/o bleeding/bruit/hematoma.  Labs    CBC  Recent Labs  10/16/15 1410  WBC 5.4  HGB 12.8*  HCT 39.3  MCV 91.8  PLT 134*   Basic Metabolic Panel  Recent Labs  10/16/15 1410 10/17/15 0657  NA  --  137  K  --  4.5  CL  --  101  CO2  --  26  GLUCOSE  --  152*  BUN  --  18  CREATININE 1.51* 1.50*  CALCIUM  --  8.8*   Thyroid Function Tests  Recent Labs  10/17/15 0657  TSH 6.676*    Telemetry    Rsr/sinus tach  ECG    Sinus tach, 117, rbbb, inflat/ant ST dep - more pronounced than on prior ecgs.  Radiology    Dg Chest 2 View  10/17/2015  CLINICAL DATA:  Sob x years worsening x4 days hx of stent EXAM: CHEST - 2 VIEW COMPARISON:  CT 08/23/2015 and previous FINDINGS: Previous CABG. Mild cardiomegaly stable. Subsegmental atelectasis in the lung bases left greater than right.  No overt edema. No effusion.  No pneumothorax. Anterior vertebral endplate spurring at multiple levels in the mid and lower thoracic spine. IMPRESSION: 1. Stable cardiomegaly. 2. Some increase in the bibasilar atelectasis. Electronically Signed   By: Corlis Leak  Hassell M.D.   On: 10/17/2015 07:43    Assessment & Plan    1.  USA/Accelerated Angina/CAD:  S/p cath yesterday revealing severe multivessel native and graft dzs.  Seen by CT surgery with plan for redo CABG on Friday 4/28 following brilinta wash-out.  He did have intermittent c/p overnight and is now on IV ntg - pain free since.  He did have dynamic ECG changes in the setting of c/p last night.  Cont asa, heparin,  blocker, acei.  Not on statin 2/2 h/o intolerances - has tried lipitor, zocor, lovastatin in the past - all causing myalgias.  Has never been on zetia.  Will add.  2.  Hypertensive heart Dzs:  BP variable with range of 94 to 190.  138/69 this AM.  Follow on IV NTG.  Cont  blocker, CCB, and acei.   3.  HL:  As above, intolerant to multiple statins in the past.  Will add  zetia.  4.  DM II:  Stable.  Cont insulin/ssi.  5.  CKD III:  Stable post-cath @ 1.5.  Continue to follow.  6.  Thyroid Mass: TSH 6.676.  Was being evaluated for thyroid bx  this is on hold in the setting of above.  7.  Morbid Obesity: plan cardiac rehab post-op.  Signed, Nicolasa Duckinghristopher Berge NP

## 2015-10-17 NOTE — Progress Notes (Signed)
RN entered room due to patient's bed alarm sounding.  Patient sitting on side of bed stating his pain was back.  Patient rating pain 8/10.  Blood pressure taken see blood pressure at 0025.  RN paged Cardiology.  Onalee HuaAlvarez returned page and was updated pertaining to patient's previous pain episode, interventions previously completed and current pain.  Onalee Hualvarez stated give one more sublingual nitroglycerin, call back if chest pain not relieved.

## 2015-10-17 NOTE — Progress Notes (Signed)
Inpatient Diabetes Program Recommendations  AACE/ADA: New Consensus Statement on Inpatient Glycemic Control (2015)  Target Ranges:  Prepandial:   less than 140 mg/dL      Peak postprandial:   less than 180 mg/dL (1-2 hours)      Critically ill patients:  140 - 180 mg/dL   Results for Clide DeutscherSMITH, Pape E (MRN [id number]) as of 10/17/2015 10:21  Ref. Range 10/16/2015 07:32 10/16/2015 10:09 10/16/2015 16:35 10/16/2015 21:15  Glucose-Capillary Latest Ref Range: 65-99 mg/dL 086187 (H) 578122 (H) 469224 (H) 140 (H)    Admit with: Angina/ Needs Redo CABG  History: DM, CKD  Home DM Meds: Lantus 64 units QHS       Novolog 50 units with breakfast/ 40 units with dinner  Current Insulin Orders: Lantus 64 units QHS/ Lantus 12 units QAM      Novolog 24 units with breakfast/ 24 units with dinner      MD- Please consider starting Novolog Sensitive Correction Scale/ SSI (0-9 units) TID AC + HS in addition to his current insulin regimen     --Will follow patient during hospitalization--  Ambrose FinlandJeannine Johnston Charitie Hinote RN, MSN, CDE Diabetes Coordinator Inpatient Glycemic Control Team Team Pager: [phone] (8a-5p)

## 2015-10-18 ENCOUNTER — Inpatient Hospital Stay (HOSPITAL_COMMUNITY): Payer: Medicare Other

## 2015-10-18 DIAGNOSIS — I2510 Atherosclerotic heart disease of native coronary artery without angina pectoris: Secondary | ICD-10-CM

## 2015-10-18 DIAGNOSIS — N183 Chronic kidney disease, stage 3 (moderate): Secondary | ICD-10-CM

## 2015-10-18 DIAGNOSIS — R401 Stupor: Secondary | ICD-10-CM

## 2015-10-18 DIAGNOSIS — I25709 Atherosclerosis of coronary artery bypass graft(s), unspecified, with unspecified angina pectoris: Secondary | ICD-10-CM | POA: Insufficient documentation

## 2015-10-18 DIAGNOSIS — I6521 Occlusion and stenosis of right carotid artery: Secondary | ICD-10-CM

## 2015-10-18 DIAGNOSIS — R4182 Altered mental status, unspecified: Secondary | ICD-10-CM | POA: Insufficient documentation

## 2015-10-18 DIAGNOSIS — I25110 Atherosclerotic heart disease of native coronary artery with unstable angina pectoris: Secondary | ICD-10-CM

## 2015-10-18 LAB — CBC
HCT: 38.6 % — ABNORMAL LOW (ref 39.0–52.0)
Hemoglobin: 12.8 g/dL — ABNORMAL LOW (ref 13.0–17.0)
MCH: 30 pg (ref 26.0–34.0)
MCHC: 33.2 g/dL (ref 30.0–36.0)
MCV: 90.6 fL (ref 78.0–100.0)
PLATELETS: 137 10*3/uL — AB (ref 150–400)
RBC: 4.26 MIL/uL (ref 4.22–5.81)
RDW: 13.8 % (ref 11.5–15.5)
WBC: 6.5 10*3/uL (ref 4.0–10.5)

## 2015-10-18 LAB — BASIC METABOLIC PANEL
Anion gap: 10 (ref 5–15)
BUN: 14 mg/dL (ref 6–20)
CALCIUM: 8.8 mg/dL — AB (ref 8.9–10.3)
CO2: 24 mmol/L (ref 22–32)
CREATININE: 1.39 mg/dL — AB (ref 0.61–1.24)
Chloride: 100 mmol/L — ABNORMAL LOW (ref 101–111)
GFR calc Af Amer: 57 mL/min — ABNORMAL LOW (ref >60)
GFR, EST NON AFRICAN AMERICAN: 49 mL/min — AB (ref >60)
GLUCOSE: 140 mg/dL — AB (ref 65–99)
POTASSIUM: 4.5 mmol/L (ref 3.5–5.1)
Sodium: 134 mmol/L — ABNORMAL LOW (ref 135–145)

## 2015-10-18 LAB — BLOOD GAS, ARTERIAL
Acid-Base Excess: 1.1 mmol/L (ref 0.0–2.0)
Bicarbonate: 27 mEq/L — ABNORMAL HIGH (ref 20.0–24.0)
Drawn by: 295031
O2 Content: 3 L/min
O2 Saturation: 95 %
Patient temperature: 98.6
TCO2: 28.8 mmol/L (ref 0–100)
pCO2 arterial: 58.4 mmHg (ref 35.0–45.0)
pH, Arterial: 7.287 — ABNORMAL LOW (ref 7.350–7.450)
pO2, Arterial: 81.7 mmHg (ref 80.0–100.0)

## 2015-10-18 LAB — HEPARIN LEVEL (UNFRACTIONATED): Heparin Unfractionated: 0.44 IU/mL (ref 0.30–0.70)

## 2015-10-18 LAB — GLUCOSE, CAPILLARY
GLUCOSE-CAPILLARY: 138 mg/dL — AB (ref 65–99)
GLUCOSE-CAPILLARY: 180 mg/dL — AB (ref 65–99)
Glucose-Capillary: 139 mg/dL — ABNORMAL HIGH (ref 65–99)
Glucose-Capillary: 88 mg/dL (ref 65–99)

## 2015-10-18 MED ORDER — SODIUM CHLORIDE 0.45 % IV SOLN
INTRAVENOUS | Status: DC
Start: 2015-10-18 — End: 2015-11-14
  Administered 2015-10-18: 19:00:00 via INTRAVENOUS

## 2015-10-18 MED ORDER — LEVALBUTEROL HCL 1.25 MG/0.5ML IN NEBU
1.2500 mg | INHALATION_SOLUTION | Freq: Four times a day (QID) | RESPIRATORY_TRACT | Status: DC
  Administered 2015-10-18 – 2015-10-25 (×27): 1.25 mg via RESPIRATORY_TRACT
  Filled 2015-10-18 (×26): qty 0.5

## 2015-10-18 MED ORDER — LEVALBUTEROL HCL 1.25 MG/0.5ML IN NEBU: 1.2500 mg | INHALATION_SOLUTION | Freq: Four times a day (QID) | RESPIRATORY_TRACT | Status: DC

## 2015-10-18 NOTE — Progress Notes (Addendum)
SUBJECTIVE:  No complaints  OBJECTIVE:   Vitals:   Filed Vitals:   10/17/15 2004 10/17/15 2015 10/17/15 2306 10/18/15 0345  BP: 123/69 146/70  145/69  Pulse: 97 92  98  Temp: 97.9 F (36.6 C)  98.4 F (36.9 C) 98.7 F (37.1 C)  TempSrc: Oral  Oral Oral  Resp: 21   27  Height:      Weight:    297 lb 9.6 oz (134.99 kg)  SpO2: 93%  94% 96%   I&O's:   Intake/Output Summary (Last 24 hours) at 10/18/15 0659 Last data filed at 10/18/15 0500  Gross per 24 hour  Intake 1039.78 ml  Output   1750 ml  Net -710.22 ml   TELEMETRY: Reviewed telemetry pt in NSR:     PHYSICAL EXAM General: Well developed, well nourished, in no acute distress Head: Eyes PERRLA, No xanthomas.   Normal cephalic and atramatic  Lungs:   Clear bilaterally to auscultation and percussion. Heart:   HRRR S1 S2 Pulses are 2+ & equal. Abdomen: Bowel sounds are positive, abdomen soft and non-tender without masses o Extremities:   No clubbing, cyanosis or edema.  DP +1 Neuro: Alert and oriented X 3. Psych:  Good affect, responds appropriately   LABS: Basic Metabolic Panel:  Recent Labs  16/03/9603/24/17 1410 10/17/15 0657  NA  --  137  K  --  4.5  CL  --  101  CO2  --  26  GLUCOSE  --  152*  BUN  --  18  CREATININE 1.51* 1.50*  CALCIUM  --  8.8*   Liver Function Tests: No results for input(s): AST, ALT, ALKPHOS, BILITOT, PROT, ALBUMIN in the last 72 hours. No results for input(s): LIPASE, AMYLASE in the last 72 hours. CBC:  Recent Labs  10/16/15 1410 10/18/15 0535  WBC 5.4 6.5  HGB 12.8* 12.8*  HCT 39.3 38.6*  MCV 91.8 90.6  PLT 134* 137*   Cardiac Enzymes: No results for input(s): CKTOTAL, CKMB, CKMBINDEX, TROPONINI in the last 72 hours. BNP: Invalid input(s): POCBNP D-Dimer: No results for input(s): DDIMER in the last 72 hours. Hemoglobin A1C: No results for input(s): HGBA1C in the last 72 hours. Fasting Lipid Panel: No results for input(s): CHOL, HDL, LDLCALC, TRIG, CHOLHDL,  LDLDIRECT in the last 72 hours. Thyroid Function Tests:  Recent Labs  10/17/15 0657  TSH 6.676*   Anemia Panel: No results for input(s): VITAMINB12, FOLATE, FERRITIN, TIBC, IRON, RETICCTPCT in the last 72 hours. Coag Panel:   Lab Results  Component Value Date   INR 1.03 10/17/2015   INR 0.90 10/11/2015   INR 0.9 07/07/2014    RADIOLOGY: Dg Chest 2 View  10/17/2015  CLINICAL DATA:  Sob x years worsening x4 days hx of stent EXAM: CHEST - 2 VIEW COMPARISON:  CT 08/23/2015 and previous FINDINGS: Previous CABG. Mild cardiomegaly stable. Subsegmental atelectasis in the lung bases left greater than right. No overt edema. No effusion.  No pneumothorax. Anterior vertebral endplate spurring at multiple levels in the mid and lower thoracic spine. IMPRESSION: 1. Stable cardiomegaly. 2. Some increase in the bibasilar atelectasis. Electronically Signed   By: Corlis Leak  Hassell M.D.   On: 10/17/2015 07:43    Assessment & Plan   1. USA/Accelerated Angina/CAD: S/p cath  revealing severe multivessel native and graft dzs. Seen by CT surgery with plan for redo CABG on Friday 4/28 following brilinta wash-out.No further CP on IV ntg.  Cont asa, heparin, ? blocker, acei. Not on  statin 2/2 h/o intolerances - has tried lipitor, zocor, lovastatin in the past - all causing myalgias. Added Zetia.  2. Hypertensive heart Dzs: BP fairly well controlled.  SBP 123-174mmHg.  Follow on IV NTG. Cont ? blocker, CCB, and acei.   3. HL: As above, intolerant to multiple statins in the past. Zetia added.  Recommend referral to lipid clinic at d/c for evaluation for [facility] 9 drug  4. DM II: Stable. Cont insulin/ssi.  5. CKD III: Stable post-cath @ 1.5. Continue to follow.  Recheck BMET this am.   6. Thyroid Mass: TSH 6.676. Was being evaluated for thyroid bx  this is on hold in the setting of above.  7. Morbid Obesity: plan cardiac rehab post-op.         Quintella Reichert, MD  10/18/2015  6:59 AM

## 2015-10-18 NOTE — Consult Note (Addendum)
Name: Tyler Le MRN: [id number] DOB: [birth year]    ADMISSION DATE:  10/16/2015 CONSULTATION DATE:  10/18/15  REFERRING MD : Prescott Gum, MD  CHIEF COMPLAINT:  Hypercapnia  BRIEF PATIENT DESCRIPTION: 70 yr old obese, needs redo CABG, called for Acute on chronic resp acidosis  SIGNIFICANT EVENTS  angina  STUDIES:  PFT 4/25- restriction lung dz, low DLCO diffusion, r/o mixed   HISTORY OF PRESENT ILLNESS: Tyler Le is a 70 y.o. male h/o f known ischemic heart disease. He underwent coronary artery bypass graft surgery in 2001. In March 2012 he underwent bare-metal stent placement to the saphenous vein graft to the intermediate and obtuse marginal. The patient was seen in December 2015 complaining of more frequent chest pain Sion.. Cardiac catheterization was recommended. On 07/11/14 patient underwent cardiac catheterization which showed Severe 3 vessel obstructive CA,  Patent LIMA to the LAD, Restenosis in the stent in SVG to ramus/OM, Occluded SVG to diagonal Occluded SVG to RCA Successful stenting of the SVG to ramus/OM ostium with a DES IN hospital assessed BY CVTS, needs redo CABG Friday with unstable angina. Also noted bilateral high grade cartoid dz, planned endarterectomy with CABG. Called to assess hypercarbic acidosis. He has snored for years, refused sleep study. Refused BIPAP in past. He had a much higehr bicarb years ago then he has now with better compensation than he has now. No diarhea or known RTA. He is in no distress, reports no SOB. PFT done, restriction, dlco noted down, concern some mixed?Marland Kitchen opast smoker  PAST MEDICAL HISTORY :   has a past medical history of Essential hypertension; Dyslipidemia; Coronary artery disease; Morbid obesity ([place]); Dysrhythmia; History of blood transfusion (2001); Anemia; GERD (gastroesophageal reflux disease); Depression; Anxiety; Nephrolithiasis; Complication of anesthesia; Type II diabetes mellitus ([place]); Arthritis; Chronic lower back  pain; Thrombocytopenia ([place]); Renal insufficiency; and RBBB.  has past surgical history that includes Cardiac catheterization (2001; ~ 2010); Coronary artery bypass graft (2001); Appendectomy (~ 1955); Lumbar laminectomy (01/2011); Back surgery; Cholecystectomy; Shoulder arthroscopy w/ rotator cuff repair (Left, 1990's); Cardiovascular stress test (07/03/2009); left heart catheterization with coronary/graft angiogram (N/A, 05/09/2011); Tonsillectomy and adenoidectomy (1951); left heart catheterization with coronary angiogram (N/A, 07/11/2014); Coronary angioplasty with stent (March 2012); Coronary angioplasty with stent (07/11/2014); and Cardiac catheterization (N/A, 10/16/2015). Prior to Admission medications   Medication Sig Start Date End Date Taking? Authorizing Provider  amLODipine (NORVASC) 5 MG tablet Take 1 tablet (5 mg total) by mouth daily. 01/17/15  Yes Darlin Coco, MD  Artificial Tear Ointment (DRY EYES OP) Place 1 drop into both eyes daily as needed (for dry eyes).   Yes Historical Provider, MD  Blood Glucose Monitoring Suppl (FREESTYLE FREEDOM) KIT  03/27/12  Yes Historical Provider, MD  BRILINTA 90 MG TABS tablet TAKE 1 TABLET BY MOUTH 2 TIMES DAILY. Patient taking differently: TAKE 90 mg (1 TABLET) BY MOUTH 2 TIMES DAILY. 09/04/15  Yes Darlin Coco, MD  busPIRone (BUSPAR) 15 MG tablet Take 15 mg by mouth daily. 08/22/15  Yes Historical Provider, MD  carvedilol (COREG) 25 MG tablet Take 0.5 tablets (12.5 mg total) by mouth 2 (two) times daily with a meal. 07/12/14  Yes Dayna N Dunn, PA-C  Cholecalciferol (VITAMIN D3) 2000 UNITS TABS Take 4,000 Units by mouth daily.    Yes Historical Provider, MD  CYMBALTA 60 MG capsule Take 60 mg by mouth daily.  03/30/12  Yes Historical Provider, MD  HYDROcodone-acetaminophen (NORCO) 10-325 MG per tablet Take 1-2 tablets by mouth every 6 (  six) hours as needed. For pain   Yes Historical Provider, MD  insulin glargine (LANTUS) 100 UNIT/ML injection Inject  64 Units into the skin at bedtime.    Yes Historical Provider, MD  isosorbide mononitrate (IMDUR) 60 MG 24 hr tablet Take 1 tablet (60 mg total) by mouth daily. 10/11/15  Yes Thayer Headings, MD  LINZESS 145 MCG CAPS capsule Take 145 mcg by mouth daily. 09/28/15  Yes Historical Provider, MD  lisinopril (PRINIVIL,ZESTRIL) 5 MG tablet Take 5 mg by mouth daily.   Yes Historical Provider, MD  mirtazapine (REMERON) 15 MG tablet Take 15 mg by mouth at bedtime.  03/30/12  Yes Historical Provider, MD  nitroGLYCERIN (NITROSTAT) 0.4 MG SL tablet Place 1 tablet (0.4 mg total) under the tongue every 5 (five) minutes as needed for chest pain (up to 3 doses). 06/14/14 10/12/15 Yes Darlin Coco, MD  NOVOLOG FLEXPEN 100 UNIT/ML FlexPen Inject 40-50 Units into the skin 2 (two) times daily. Use 50 units in the morning and use 40 units in the evening 07/01/13  Yes Historical Provider, MD  QUEtiapine (SEROQUEL) 400 MG tablet Take 400 mg by mouth at bedtime.   Yes Historical Provider, MD   Allergies  Allergen Reactions  . Metformin And Related Nausea And Vomiting  . Pravachol Nausea And Vomiting  . Ramipril Nausea Only  . Wellbutrin [Bupropion Hcl] Nausea And Vomiting    FAMILY HISTORY:  family history includes Heart attack in his father; Heart disease in his brother, father, and mother. SOCIAL HISTORY:  reports that he has quit smoking. His smoking use included Cigarettes. He has a 100 pack-year smoking history. He has quit using smokeless tobacco. His smokeless tobacco use included Chew. He reports that he drinks alcohol. He reports that he does not use illicit drugs.  REVIEW OF SYSTEMS:   Constitutional: Negative for fever, chills, weight loss, malaise/fatigue and diaphoresis.  HENT: Negative for hearing loss, ear pain, nosebleeds, congestion, sore throat, neck pain, tinnitus and ear discharge.   Eyes: Negative for blurred vision, double vision, photophobia, pain, discharge and redness.  Respiratory: Negative  for cough, hemoptysis, sputum production, shortness of breath, wheezing and stridor.   Cardiovascular: POS CP, see above.  Gastrointestinal: Negative for heartburn, nausea, vomiting, abdominal pain, diarrhea, constipation, blood in stool and melena.  Genitourinary: Negative for dysuria, urgency, frequency, hematuria and flank pain.  Musculoskeletal: Negative for myalgias, back pain, joint pain and falls.  Skin: Negative for itching and rash.  Neurological: Negative for dizziness, tingling, tremors, sensory change, speech change, focal weakness, seizures, loss of consciousness, weakness and headaches.  Endo/Heme/Allergies: Negative for environmental allergies and polydipsia. Does not bruise/bleed easily.  SUBJECTIVE: denies SOB  VITAL SIGNS: Temp:  [97.9 F (36.6 C)-98.7 F (37.1 C)] 98 F (36.7 C) (04/26 1530) Pulse Rate:  [92-98] 92 (04/26 1530) Resp:  [18-27] 18 (04/26 1530) BP: (123-146)/(65-72) 133/72 mmHg (04/26 1530) SpO2:  [93 %-96 %] 96 % (04/26 1530) Weight:  [134.99 kg (297 lb 9.6 oz)] 134.99 kg (297 lb 9.6 oz) (04/26 0345)  PHYSICAL EXAMINATION: General:  In chair, no distress, wide awake Neuro:  Awake, nonfocal examination HEENT:  Obese, mild jvd Cardiovascular:  s1 s2 RRR Lungs:  Distant but clear,  Abdomen:  Obese, NT, ND, no r/g Musculoskeletal:  No sig edema Skin:  Mild hyperpigmentation    Recent Labs Lab 10/16/15 1410 10/17/15 0657 10/18/15 0535  NA  --  137 134*  K  --  4.5 4.5  CL  --  101 100*  CO2  --  26 24  BUN  --  18 14  CREATININE 1.51* 1.50* 1.39*  GLUCOSE  --  152* 140*    Recent Labs Lab 10/16/15 1410 10/18/15 0535  HGB 12.8* 12.8*  HCT 39.3 38.6*  WBC 5.4 6.5  PLT 134* 137*   Dg Chest 2 View  10/17/2015  CLINICAL DATA:  Sob x years worsening x4 days hx of stent EXAM: CHEST - 2 VIEW COMPARISON:  CT 08/23/2015 and previous FINDINGS: Previous CABG. Mild cardiomegaly stable. Subsegmental atelectasis in the lung bases left greater  than right. No overt edema. No effusion.  No pneumothorax. Anterior vertebral endplate spurring at multiple levels in the mid and lower thoracic spine. IMPRESSION: 1. Stable cardiomegaly. 2. Some increase in the bibasilar atelectasis. Electronically Signed   By: Lucrezia Europe M.D.   On: 10/17/2015 07:43    ASSESSMENT / PLAN:  Angina Multivessel CAD requiring redo CABG Bilateral carotid stenosis without tia, cva Presumed OSA OHS Medical noncompliance with NIMV in past Lower compensatory bicarb on chemistry then in past Acute on small chronic resp acidosis (likely baseline co2 about 38) Restrictive lung dz (obesity), low DLCO( pulm congestion?) CT chest neg ILD 3/1  -start BIPAP low settings ( to improve compliance) nocturnal only 8/4 as no distress appears good daytime -at 5 am on BIPAP get abg -he had a bicarb in past 29-31 better compensated, has small NONAG now -avoid Chloride, change to 1/2 NS, I have a low threshold to add oral bicarb x 72 hrs; likely renal insuff has limited compensation - will see abg in am prior to starting this -would proceed with CABG Friday, he appears good and correcting his Co2 to fully compensated status will take days, likely will be on vent overnight post op or longer, regardless -repeat pcxr in am  -low threshold for lasix if okay by CVTS , would assist in elevating chem bicarb; he was neg on own 700cc, hold off for now (would explain DLCO)  i updated pt and his wife in room PCCM will follow   Lavon Paganini. Titus Mould, MD, [place] Pgr: [facility]  Pulmonary and [place] Pager: [phone]  10/18/2015, 5:20 PM

## 2015-10-18 NOTE — Progress Notes (Addendum)
VASCULAR LAB PRELIMINARY  PRELIMINARY  PRELIMINARY  PRELIMINARY  Pre-op Cardiac Surgery  Carotid Findings: Bilateral :  Greater than 80% internal carotid artery stenosis.  Vertebral artery flow is antegrade   Upper Extremity Right Left  Brachial Pressures 148 Triphasic 138 Triphasic  Radial Waveforms Triphasic Triphasic  Ulnar Waveforms Triphasic Biphasic  Palmar Arch (Allen's Test) Abnormal Abnormal   Findings:  Right - Doppler waveforms obliterate with radial compression and remain normal with ulnar compression. Left - Doppler waveforms remain normal with radial compression and obliterate with ulnar compression     Lower  Extremity Right Left  Dorsalis Pedis 114 Monophasic 131 Biphasic  Posterior Tibial 109 Monophasic 132 Monophasic  Ankle/Brachial Indices 0.77 0.90    Findings:  ABIs indicate a mild reduction in arterial flow bilaterally at rest with the left being greater than the right  Report callet to [facility] Br[facility]yan RN at [facility] Dr. Donata ClayVan Trigt was in surgery.  Abdirahman Chittum, RVS 10/18/2015, 12:00 PM

## 2015-10-18 NOTE — Consult Note (Signed)
[facility] [place]  Patient name: Tyler Le MRN: [id number] DOB: 11-Nov-1945 Sex: male  REASON FOR CONSULT: bilateral carotid stenosis, consult is from Dr. Donata ClayVan Trigt  HPI: Tyler Le is a 70 y.o. male, who presents for evaluation of bilateral internal carotid artery stenosis. He is scheduled for a redo CABG on Friday with Dr. Donata ClayVan Trigt for severe recurrent three vessel CAD. He previously had a CABG in 2001. He underwent cardiac cath on 10/16/15 due to unstable angina and was admitted afterwards.    The patient denies a history of amaurosis fugax, hemiplegia, receptive or expressive aphasia. He reports having dyspnea on exertion. His ambulation is limited by DOE. He denies any nonhealing wounds. Full ROS below.   His past medical history includes hypertension, hyperlipidemia, morbid obesity and CKD stage III. He is a previous smoker.   Past Medical History  Diagnosis Date  . Essential hypertension   . Dyslipidemia   . Coronary artery disease     a. 2001: s/p CABG X4;  b. 08/2010 s/p PCI/BMS to VG->RI->OM;  c. 06/2014 Cath/PCI: LM nl, LAD 1524m, LCX 6676m, RI 100, OM1 100, OM2 80, small, RCA 100ost, VG->RCA 100, VG->Diag 100, VG->RI->OM1 70-80 ISR (4.0x24 Promus DES), LIMA->LAD nl.  . Morbid obesity (H[facility])   . Dysrhythmia   . History of blood transfusion 2001    "related to OHS"  . Anemia   . GERD (gastroesophageal reflux disease)   . Depression   . Anxiety   . Nephrolithiasis   . Complication of anesthesia     "they have a hard time waking him up"  . Type II diabetes mellitus (HCC)   . Arthritis     "back, neck" (07/11/2014)  . Chronic lower back pain   . Thrombocytopenia ([facility])     a. Borderline low platelets by labs noted in 06/2014 but also noted on prior labs as well.  . Renal insufficiency     a. noted 06/2014 - baseline unclear as no data since 2012.  Marland Kitchen. RBBB     Family History  Problem Relation Age of Onset  . Heart disease Mother   . Heart  disease Father   . Heart attack Father   . Heart disease Brother     SOCIAL HISTORY: Social History   Social History  . Marital Status: Divorced    Spouse Name: N/A  . Number of Children: N/A  . Years of Education: N/A   Occupational History  . Not on file.   Social History Main Topics  . Smoking status: Former Smoker -- 4.00 packs/day for 25 years    Types: Cigarettes  . Smokeless tobacco: Former NeurosurgeonUser    Types: Chew     Comment: "quit smoking ~ 1985; chewed off and on for a few years; quit chewing in ~ 1985 too"  . Alcohol Use: Yes     Comment: 07/11/2014 "might take a drink 5X/yr"  . Drug Use: No  . Sexual Activity: No   Other Topics Concern  . Not on file   Social History Narrative    Allergies  Allergen Reactions  . Metformin And Related Nausea And Vomiting  . Pravachol Nausea And Vomiting  . Ramipril Nausea Only  . Wellbutrin [Bupropion Hcl] Nausea And Vomiting    Current Facility-Administered Medications  Medication Dose Route Frequency Provider Last Rate Last Dose  . 0.9 %  sodium chloride infusion  250 mL Intravenous PRN Corky CraftsJayadeep S Varanasi, MD      .  acetaminophen (TYLENOL) tablet 650 mg  650 mg Oral Q4H PRN Corky Crafts, MD   650 mg at 10/17/15 1445  . amLODipine (NORVASC) tablet 5 mg  5 mg Oral Daily Corky Crafts, MD   5 mg at 10/18/15 0859  . aspirin chewable tablet 81 mg  81 mg Oral Daily Corky Crafts, MD   81 mg at 10/18/15 0901  . busPIRone (BUSPAR) tablet 15 mg  15 mg Oral Daily Corky Crafts, MD   15 mg at 10/18/15 0900  . carvedilol (COREG) tablet 12.5 mg  12.5 mg Oral BID WC Corky Crafts, MD   12.5 mg at 10/18/15 0859  . DULoxetine (CYMBALTA) DR capsule 60 mg  60 mg Oral Daily Corky Crafts, MD   60 mg at 10/18/15 0901  . ezetimibe (ZETIA) tablet 10 mg  10 mg Oral Daily Ok Anis, NP   10 mg at 10/18/15 0900  . heparin ADULT infusion 100 units/mL (25000 units/250 mL)  1,600 Units/hr Intravenous  Continuous Corky Crafts, MD   Stopped at 10/18/15 1220  . HYDROcodone-acetaminophen (NORCO) 10-325 MG per tablet 1-2 tablet  1-2 tablet Oral Q6H PRN Corky Crafts, MD   2 tablet at 10/17/15 1823  . insulin aspart (novoLOG) injection 24 Units  24 Units Subcutaneous Q supper Corky Crafts, MD   24 Units at 10/17/15 1738  . insulin aspart (novoLOG) injection 24 Units  24 Units Subcutaneous Q breakfast Corky Crafts, MD   24 Units at 10/18/15 [id number]  . insulin glargine (LANTUS) injection 12 Units  12 Units Subcutaneous Daily Kerin Perna, MD   12 Units at 10/18/15 0900  . insulin glargine (LANTUS) injection 64 Units  64 Units Subcutaneous QHS Corky Crafts, MD   64 Units at 10/17/15 2328  . linaclotide (LINZESS) capsule 145 mcg  145 mcg Oral Daily Corky Crafts, MD   145 mcg at 10/18/15 0900  . Liraglutide SOPN 1.2 mg  1.2 mg Subcutaneous Daily Corky Crafts, MD   Stopped at 10/18/15 1000  . lisinopril (PRINIVIL,ZESTRIL) tablet 5 mg  5 mg Oral Daily Corky Crafts, MD   5 mg at 10/18/15 0900  . mirtazapine (REMERON) tablet 15 mg  15 mg Oral QHS Corky Crafts, MD   15 mg at 10/17/15 2328  . nitroGLYCERIN (NITROSTAT) SL tablet 0.4 mg  0.4 mg Sublingual Q5 min PRN Corky Crafts, MD   0.4 mg at 10/17/15 0029  . nitroGLYCERIN 50 mg in dextrose 5 % 250 mL (0.2 mg/mL) infusion  2-200 mcg/min Intravenous Titrated Ok Anis, NP 1.5 mL/hr at 10/18/15 0848 5 mcg/min at 10/18/15 0848  . ondansetron (ZOFRAN) injection 4 mg  4 mg Intravenous Q6H PRN Corky Crafts, MD      . QUEtiapine (SEROQUEL) tablet 400 mg  400 mg Oral QHS Corky Crafts, MD   400 mg at 10/17/15 2328  . sodium chloride flush (NS) 0.9 % injection 3 mL  3 mL Intravenous Q12H Corky Crafts, MD   3 mL at 10/18/15 0926  . sodium chloride flush (NS) 0.9 % injection 3 mL  3 mL Intravenous PRN Corky Crafts, MD   3 mL at 10/16/15 2123    REVIEW OF SYSTEMS:    denotes positive finding,  denotes negative finding Cardiac  Comments:  Chest pain or chest pressure:    Shortness of breath upon exertion: x   Short of breath  when lying flat:    Irregular heart rhythm:        Vascular    Pain in calf, thigh, or hip brought on by ambulation:    Pain in feet at night that wakes you up from your sleep:     Blood clot in your veins:    Leg swelling:  x       Pulmonary    Oxygen at home:    Productive cough:     Wheezing:         Neurologic    Sudden weakness in arms or legs:     Sudden numbness in arms or legs:     Sudden onset of difficulty speaking or slurred speech:    Temporary loss of vision in one eye:     Problems with dizziness:         Gastrointestinal    Blood in stool:     Vomited blood:         Genitourinary    Burning when urinating:     Blood in urine:        Psychiatric    Major depression:         Hematologic    Bleeding problems:    Problems with blood clotting too easily:        Skin    Rashes or ulcers:        Constitutional    Fever or chills:      PHYSICAL EXAM: Filed Vitals:   10/18/15 0345 10/18/15 0758 10/18/15 1223 10/18/15 1530  BP: 145/69 137/65 136/69 133/72  Pulse: 98 96 95 92  Temp: 98.7 F (37.1 C) 98.2 F (36.8 C) 98 F (36.7 C) 98 F (36.7 C)  TempSrc: Oral Oral Oral Oral  Resp: Height:      Weight: 297 lb 9.6 oz (134.99 kg)     SpO2: 96% 96% 95% 96%    GENERAL: The patient is an obese well-nourished male, in no acute distress. The vital signs are documented above. CARDIAC: There is a regular rate and rhythm.  VASCULAR: carotid bruits bilaterally, feet warm and well perfused, bilateral pedal edema PULMONARY: There is good air exchange bilaterally without wheezing or rales. ABDOMEN: Soft and non-tender with normal pitched bowel sounds.  MUSCULOSKELETAL: There are no major deformities or cyanosis. NEUROLOGIC: No focal weakness or paresthesias are detected. SKIN: There  are wounds seen.  PSYCHIATRIC: The patient has a normal affect.  DATA:  Preliminary carotid duplex 10/18/15 reveals >80% internal carotid artery stenosis bilaterally. Vertebral artery flow is antegrade. Full report pending.   MEDICAL ISSUES:  Unstable angina with severe recurrent three vessel CAD Bilateral high grade asymptomatic internal carotid artery stenosis  The patient has had no TIA or stroke symptoms. Given bilateral high grade ICA stenosis >80%, recommend combined carotid endarterectomy with redo CABG on Friday. Full results from carotid duplex pending. Laterality of endarterectomy will be determined based on final duplex findings. Dr. Hart Rochester to evaluate patient.   Maris Berger, PA-C [facility] [place] [phone] Agree with above assessment Patient has quite severe bilateral internal carotid stenosis in the 90% range in severity He is asymptomatic from a neurologic standpoint has no history of CVA  I would recommend combining right carotid endarterectomy with redo coronary artery bypass grafting and then follow a few months later with left carotid endarterectomy as patient's condition allows  Discuss this with patient and tentatively planning for Friday morning We will  give him final decision tomorrow

## 2015-10-18 NOTE — Progress Notes (Signed)
VASCULAR LAB PRELIMINARY  PRELIMINARY  PRELIMINARY  PRELIMINARY  Right Lower Extremity Vein Map    Right Great Saphenous Vein   Segment Diameter Comment  1. Origin 4.7 mm   2. High Thigh 2.3 mm   3. Mid Thigh 3.0 mm   4. Low Thigh 2.2 mm   5. At Knee 2.2 mm   6. High Calf 2.332mm   7. Low Calf mm Appears Harvested  8. Ankle mm Appears Harvested      Left Lower Extremity Vein Map    Left Great Saphenous Vein   Segment Diameter Comment  1. Origin 7.9 mm   2. High Thigh 4.6 mm   3. Mid Thigh 3.9 mm   4. Low Thigh 3.7 mm   5. At Knee 4.6 mm   6. High Calf 3.9 mm   7. Low Calf 3.6 mm   8. Ankle 2.8 mm     All areas imaged bilaterally are patent and compressible   Aaren Krog, RVS 10/18/2015, 1:00 PM

## 2015-10-18 NOTE — Care Management Note (Signed)
Case Management Note  Patient Details  Name: Tyler DeutscherDwight E Mojica MRN: [id number] Date of Birth: [birth year]  Subjective/Objective:  Pt admitted for Unstable Angina. Plan for CABG on 10-20-15. Pt is from home with wife. Pt has no DME needs at this time.                 Action/Plan:CM will continue to monitor for disposition needs.   Expected Discharge Date:                  Expected Discharge Plan:  Home w Home Health Services  In-House Referral:  NA  Discharge planning Services  CM Consult  Post Acute Care Choice:    Choice offered to:     DME Arranged:    DME Agency:     HH Arranged:    HH Agency:     Status of Service:  In process, will continue to follow  Medicare Important Message Given:    Date Medicare IM Given:    Medicare IM give by:    Date Additional Medicare IM Given:    Additional Medicare Important Message give by:     If discussed at Long Length of Stay Meetings, dates discussed:    Additional Comments:  Gala LewandowskyGraves-Bigelow, Skya Mccullum Kaye, RN 10/18/2015, 2:33 PM

## 2015-10-18 NOTE — Progress Notes (Signed)
CRITICAL VALUE ALERT  Critical value received:  ABG 10/18/2015   Date of notification:  10/18/2015   Time of notification:  1508   Critical value read back: no  Nurse who received alert:  Spero Geraldskenyetta   MD notified (1st page):  Zenaida NieceVan tright  Time of first page:   MD notified (2nd page):  Time of second page:  Responding MD:   Time MD responded:

## 2015-10-18 NOTE — Progress Notes (Signed)
ANTICOAGULATION CONSULT NOTE  Pharmacy Consult for Heparin Indication: chest pain/ACS, planning redo CABG Friday  Allergies  Allergen Reactions  . Metformin And Related Nausea And Vomiting  . Pravachol Nausea And Vomiting  . Ramipril Nausea Only  . Wellbutrin [Bupropion Hcl] Nausea And Vomiting    Patient Measurements: Height: 6\' 4"  (193 cm) Weight: 297 lb 9.6 oz (134.99 kg) IBW/kg (Calculated) : 86.8 Heparin Dosing Weight: 117  Vital Signs: Temp: 98.2 F (36.8 C) (04/26 0758) Temp Source: Oral (04/26 0758) BP: 137/65 mmHg (04/26 0758) Pulse Rate: 96 (04/26 0758)  Labs:  Recent Labs  10/16/15 1410 10/16/15 2028 10/17/15 0657 10/17/15 1431 10/17/15 2150 10/18/15 0535  HGB 12.8*  --   --   --   --  12.8*  HCT 39.3  --   --   --   --  38.6*  PLT 134*  --   --   --   --  137*  APTT  --  31  --   --   --   --   LABPROT  --   --  13.7  --   --   --   INR  --   --  1.03  --   --   --   HEPARINUNFRC  --   --   --  0.46 0.44 0.44  CREATININE 1.51*  --  1.50*  --   --  1.39*    Estimated Creatinine Clearance: 73.2 mL/min (by C-G formula based on Cr of 1.39).   Medical History: Past Medical History  Diagnosis Date  . Essential hypertension   . Dyslipidemia   . Coronary artery disease     a. 2001: s/p CABG X4;  b. 08/2010 s/p PCI/BMS to VG->RI->OM;  c. 06/2014 Cath/PCI: LM nl, LAD 14471m, LCX 8015m, RI 100, OM1 100, OM2 80, small, RCA 100ost, VG->RCA 100, VG->Diag 100, VG->RI->OM1 70-80 ISR (4.0x24 Promus DES), LIMA->LAD nl.  . Morbid obesity (HCC)   . Dysrhythmia   . History of blood transfusion 2001    "related to OHS"  . Anemia   . GERD (gastroesophageal reflux disease)   . Depression   . Anxiety   . Nephrolithiasis   . Complication of anesthesia     "they have a hard time waking him up"  . Type II diabetes mellitus (HCC)   . Arthritis     "back, neck" (07/11/2014)  . Chronic lower back pain   . Thrombocytopenia (HCC)     a. Borderline low platelets by labs  noted in 06/2014 but also noted on prior labs as well.  . Renal insufficiency     a. noted 06/2014 - baseline unclear as no data since 2012.  Marland Kitchen. RBBB     Assessment: 70 year old male s/p cath with plans for redo CABG on Friday after Brilinta washout  HL stable, CBC stable  Goal of Therapy:  Heparin level 0.3-0.7 units/ml Monitor platelets by anticoagulation protocol: Yes   Plan:  Continue heparin drip at 1600 units / hr Daily heparin level, CBC  Thank you. Okey RegalLisa Janean Eischen, PharmD [phone]  10/18/2015,9:40 AM

## 2015-10-19 ENCOUNTER — Encounter (HOSPITAL_COMMUNITY): Payer: Self-pay | Admitting: *Deleted

## 2015-10-19 ENCOUNTER — Inpatient Hospital Stay (HOSPITAL_COMMUNITY): Payer: Medicare Other

## 2015-10-19 DIAGNOSIS — G4733 Obstructive sleep apnea (adult) (pediatric): Secondary | ICD-10-CM

## 2015-10-19 DIAGNOSIS — E662 Morbid (severe) obesity with alveolar hypoventilation: Secondary | ICD-10-CM

## 2015-10-19 LAB — HEPARIN LEVEL (UNFRACTIONATED): Heparin Unfractionated: 0.36 IU/mL (ref 0.30–0.70)

## 2015-10-19 LAB — ABO/RH: ABO/RH(D): O NEG

## 2015-10-19 LAB — CBC
HCT: 35.3 % — ABNORMAL LOW (ref 39.0–52.0)
HEMOGLOBIN: 11.9 g/dL — AB (ref 13.0–17.0)
MCH: 30.2 pg (ref 26.0–34.0)
MCHC: 33.7 g/dL (ref 30.0–36.0)
MCV: 89.6 fL (ref 78.0–100.0)
Platelets: 106 10*3/uL — ABNORMAL LOW (ref 150–400)
RBC: 3.94 MIL/uL — ABNORMAL LOW (ref 4.22–5.81)
RDW: 13.6 % (ref 11.5–15.5)
WBC: 5.4 10*3/uL (ref 4.0–10.5)

## 2015-10-19 LAB — GLUCOSE, CAPILLARY
GLUCOSE-CAPILLARY: 133 mg/dL — AB (ref 65–99)
GLUCOSE-CAPILLARY: 206 mg/dL — AB (ref 65–99)
GLUCOSE-CAPILLARY: 116 mg/dL — AB (ref 65–99)
Glucose-Capillary: 145 mg/dL — ABNORMAL HIGH (ref 65–99)

## 2015-10-19 LAB — MRSA PCR SCREENING: MRSA BY PCR: NEGATIVE

## 2015-10-19 MED ORDER — SODIUM CHLORIDE 0.9% FLUSH
10.0000 mL | INTRAVENOUS | Status: DC | PRN
Start: 2015-10-19 — End: 2015-11-14
  Administered 2015-10-26: 10 mL
  Administered 2015-11-09 – 2015-11-13 (×4): 30 mL
  Filled 2015-10-19 (×5): qty 40

## 2015-10-19 MED ORDER — NITROGLYCERIN IN D5W 200-5 MCG/ML-% IV SOLN
2.0000 ug/min | INTRAVENOUS | Status: DC
Start: 2015-10-20 — End: 2015-10-20
  Filled 2015-10-19: qty 250

## 2015-10-19 MED ORDER — BISACODYL 5 MG PO TBEC
5.0000 mg | DELAYED_RELEASE_TABLET | Freq: Once | ORAL | Status: AC
  Administered 2015-10-19: 5 mg via ORAL
  Filled 2015-10-19: qty 1

## 2015-10-19 MED ORDER — DIAZEPAM 5 MG PO TABS
5.0000 mg | ORAL_TABLET | Freq: Once | ORAL | Status: AC
  Administered 2015-10-20: 5 mg via ORAL
  Filled 2015-10-19: qty 1

## 2015-10-19 MED ORDER — METOPROLOL TARTRATE 12.5 MG HALF TABLET
12.5000 mg | ORAL_TABLET | Freq: Once | ORAL | Status: AC
  Administered 2015-10-20: 12.5 mg via ORAL
  Filled 2015-10-19: qty 1

## 2015-10-19 MED ORDER — PHENYLEPHRINE HCL 10 MG/ML IJ SOLN
30.0000 ug/min | INTRAVENOUS | Status: DC
  Filled 2015-10-19 (×2): qty 2

## 2015-10-19 MED ORDER — POTASSIUM CHLORIDE 2 MEQ/ML IV SOLN
80.0000 meq | INTRAVENOUS | Status: DC
  Filled 2015-10-19: qty 40

## 2015-10-19 MED ORDER — DEXTROSE 5 % IV SOLN
1.5000 g | INTRAVENOUS | Status: AC
  Administered 2015-10-20: .75 g via INTRAVENOUS
  Administered 2015-10-20: 1.5 g via INTRAVENOUS
  Filled 2015-10-19: qty 1.5

## 2015-10-19 MED ORDER — EPINEPHRINE HCL 1 MG/ML IJ SOLN
0.0000 ug/min | INTRAVENOUS | Status: DC
  Filled 2015-10-19: qty 4

## 2015-10-19 MED ORDER — SODIUM CHLORIDE 0.9 % IV SOLN
INTRAVENOUS | Status: DC
  Filled 2015-10-19 (×2): qty 40

## 2015-10-19 MED ORDER — SODIUM CHLORIDE 0.9 % IV SOLN
INTRAVENOUS | Status: DC
  Filled 2015-10-19: qty 2.5

## 2015-10-19 MED ORDER — DEXTROSE 5 % IV SOLN
750.0000 mg | INTRAVENOUS | Status: DC
  Filled 2015-10-19: qty 750

## 2015-10-19 MED ORDER — DEXMEDETOMIDINE HCL IN NACL 400 MCG/100ML IV SOLN
0.1000 ug/kg/h | INTRAVENOUS | Status: DC
  Filled 2015-10-19 (×2): qty 100

## 2015-10-19 MED ORDER — VANCOMYCIN HCL 10 G IV SOLR
1500.0000 mg | INTRAVENOUS | Status: AC
  Administered 2015-10-20: 1500 mg via INTRAVENOUS
  Filled 2015-10-19: qty 1500

## 2015-10-19 MED ORDER — HEPARIN SODIUM (PORCINE) 1000 UNIT/ML IJ SOLN
INTRAMUSCULAR | Status: DC
  Filled 2015-10-19: qty 30

## 2015-10-19 MED ORDER — PAPAVERINE HCL 30 MG/ML IJ SOLN
INTRAMUSCULAR | Status: AC
  Administered 2015-10-20: 500 mL
  Filled 2015-10-19: qty 2.5

## 2015-10-19 MED ORDER — CHLORHEXIDINE GLUCONATE 4 % EX LIQD
60.0000 mL | Freq: Once | CUTANEOUS | Status: AC
  Administered 2015-10-20: 4 via TOPICAL
  Filled 2015-10-19: qty 60

## 2015-10-19 MED ORDER — CHLORHEXIDINE GLUCONATE 0.12 % MT SOLN
15.0000 mL | Freq: Once | OROMUCOSAL | Status: AC
  Administered 2015-10-20: 15 mL via OROMUCOSAL
  Filled 2015-10-19: qty 15

## 2015-10-19 MED ORDER — MAGNESIUM SULFATE 50 % IJ SOLN
40.0000 meq | INTRAMUSCULAR | Status: DC
  Filled 2015-10-19: qty 10

## 2015-10-19 MED ORDER — DOPAMINE-DEXTROSE 3.2-5 MG/ML-% IV SOLN
0.0000 ug/kg/min | INTRAVENOUS | Status: DC
  Filled 2015-10-19: qty 250

## 2015-10-19 MED ORDER — CHLORHEXIDINE GLUCONATE 4 % EX LIQD
60.0000 mL | Freq: Once | CUTANEOUS | Status: AC
Start: 2015-10-19 — End: 2015-10-19
  Administered 2015-10-19: 4 via TOPICAL
  Filled 2015-10-19: qty 60

## 2015-10-19 MED ORDER — TEMAZEPAM 15 MG PO CAPS: 15.0000 mg | ORAL_CAPSULE | Freq: Once | ORAL | Status: DC | PRN

## 2015-10-19 NOTE — Care Management Important Message (Signed)
Important Message  Patient Details  Name: Clide DeutscherDwight E Lantier MRN: [id number] Date of Birth: 1945-07-03   Medicare Important Message Given:  Yes    [name], Fany Cavanaugh [name] 10/19/2015, 1:49 PM

## 2015-10-19 NOTE — Progress Notes (Signed)
ANTICOAGULATION CONSULT NOTE  Pharmacy Consult for Heparin Indication: chest pain/ACS, planning redo CABG Friday  Allergies  Allergen Reactions  . Metformin And Related Nausea And Vomiting  . Pravachol Nausea And Vomiting  . Ramipril Nausea Only  . Wellbutrin [Bupropion Hcl] Nausea And Vomiting    Patient Measurements: Height: 6\' 4"  (193 cm) Weight:  (refused weight RN notified) IBW/kg (Calculated) : 86.8 Heparin Dosing Weight: 117  Vital Signs: Temp: 98.2 F (36.8 C) (04/27 0748) Temp Source: Oral (04/27 0748) BP: 132/68 mmHg (04/27 0909) Pulse Rate: 96 (04/27 0752)  Labs:  Recent Labs  10/16/15 1410 10/16/15 2028 10/17/15 0657  10/17/15 2150 10/18/15 0535 10/19/15 0457  HGB 12.8*  --   --   --   --  12.8*  --   HCT 39.3  --   --   --   --  38.6*  --   PLT 134*  --   --   --   --  137*  --   APTT  --  31  --   --   --   --   --   LABPROT  --   --  13.7  --   --   --   --   INR  --   --  1.03  --   --   --   --   HEPARINUNFRC  --   --   --   < > 0.44 0.44 0.36  CREATININE 1.51*  --  1.50*  --   --  1.39*  --   < > = values in this interval not displayed.  Estimated Creatinine Clearance: 73.2 mL/min (by C-G formula based on Cr of 1.39).   Medical History: Past Medical History  Diagnosis Date  . Essential hypertension   . Dyslipidemia   . Coronary artery disease     a. 2001: s/p CABG X4;  b. 08/2010 s/p PCI/BMS to VG->RI->OM;  c. 06/2014 Cath/PCI: LM nl, LAD 17119m, LCX 1411m, RI 100, OM1 100, OM2 80, small, RCA 100ost, VG->RCA 100, VG->Diag 100, VG->RI->OM1 70-80 ISR (4.0x24 Promus DES), LIMA->LAD nl.  . Morbid obesity (HCC)   . Dysrhythmia   . History of blood transfusion 2001    "related to OHS"  . Anemia   . GERD (gastroesophageal reflux disease)   . Depression   . Anxiety   . Nephrolithiasis   . Complication of anesthesia     "they have a hard time waking him up"  . Type II diabetes mellitus (HCC)   . Arthritis     "back, neck" (07/11/2014)  . Chronic  lower back pain   . Thrombocytopenia (HCC)     a. Borderline low platelets by labs noted in 06/2014 but also noted on prior labs as well.  . Renal insufficiency     a. noted 06/2014 - baseline unclear as no data since 2012.  Tyler Le. RBBB     Assessment: 70 year old male s/p cath with plans for redo CABG on Friday after Brilinta washout  HL stable, CBC stable  Goal of Therapy:  Heparin level 0.3-0.7 units/ml Monitor platelets by anticoagulation protocol: Yes   Plan:  Continue heparin drip at 1600 units / hr Daily heparin level, CBC  Thank you. Okey RegalLisa Cannie Muckle, PharmD [phone]  10/19/2015,9:31 AM

## 2015-10-19 NOTE — Progress Notes (Signed)
Placed pt. on BiPAP Auto with setting of IPAP-3510max/EPAP-5max, oxygen blended into circuit @ 2lpm, used LG/FFM, pt. tolerating well, RN made aware, RT to monitor, ABG on room air per order scheduled for 0500.

## 2015-10-19 NOTE — Progress Notes (Signed)
SUBJECTIVE:  No complaints of chest pain or SOB  OBJECTIVE:   Vitals:   Filed Vitals:   10/19/15 0345 10/19/15 0748 10/19/15 0752 10/19/15 0909  BP:  128/88  132/68  Pulse:  96 96   Temp: 98 F (36.7 C) 98.2 F (36.8 C)    TempSrc: Oral Oral    Resp:  19 15   Height:      Weight:      SpO2: 95% 96% 96%    I&O's:   Intake/Output Summary (Last 24 hours) at 10/19/15 1035 Last data filed at 10/19/15 1610  Gross per 24 hour  Intake 977.11 ml  Output   1550 ml  Net -572.89 ml   TELEMETRY: Reviewed telemetry pt in NSR:     PHYSICAL EXAM General: Well developed, well nourished, in no acute distress Head: Eyes PERRLA, No xanthomas.   Normal cephalic and atramatic  Lungs:   Clear bilaterally to auscultation and percussion. Heart:   HRRR S1 S2 Pulses are 2+ & equal. Abdomen: Bowel sounds are positive, abdomen soft and non-tender without masses  Extremities:   No clubbing, cyanosis or edema.  DP +1 Neuro: Alert and oriented X 3. Psych:  Good affect, responds appropriately   LABS: Basic Metabolic Panel:  Recent Labs  96/04/54 0657 10/18/15 0535  NA 137 134*  K 4.5 4.5  CL 101 100*  CO2 26 24  GLUCOSE 152* 140*  BUN 18 14  CREATININE 1.50* 1.39*  CALCIUM 8.8* 8.8*   Liver Function Tests: No results for input(s): AST, ALT, ALKPHOS, BILITOT, PROT, ALBUMIN in the last 72 hours. No results for input(s): LIPASE, AMYLASE in the last 72 hours. CBC:  Recent Labs  10/16/15 1410 10/18/15 0535  WBC 5.4 6.5  HGB 12.8* 12.8*  HCT 39.3 38.6*  MCV 91.8 90.6  PLT 134* 137*   Cardiac Enzymes: No results for input(s): CKTOTAL, CKMB, CKMBINDEX, TROPONINI in the last 72 hours. BNP: Invalid input(s): POCBNP D-Dimer: No results for input(s): DDIMER in the last 72 hours. Hemoglobin A1C: No results for input(s): HGBA1C in the last 72 hours. Fasting Lipid Panel: No results for input(s): CHOL, HDL, LDLCALC, TRIG, CHOLHDL, LDLDIRECT in the last 72 hours. Thyroid Function  Tests:  Recent Labs  10/17/15 0657  TSH 6.676*   Anemia Panel: No results for input(s): VITAMINB12, FOLATE, FERRITIN, TIBC, IRON, RETICCTPCT in the last 72 hours. Coag Panel:   Lab Results  Component Value Date   INR 1.03 10/17/2015   INR 0.90 10/11/2015   INR 0.9 07/07/2014    RADIOLOGY: Dg Chest 2 View  10/17/2015  CLINICAL DATA:  Sob x years worsening x4 days hx of stent EXAM: CHEST - 2 VIEW COMPARISON:  CT 08/23/2015 and previous FINDINGS: Previous CABG. Mild cardiomegaly stable. Subsegmental atelectasis in the lung bases left greater than right. No overt edema. No effusion.  No pneumothorax. Anterior vertebral endplate spurring at multiple levels in the mid and lower thoracic spine. IMPRESSION: 1. Stable cardiomegaly. 2. Some increase in the bibasilar atelectasis. Electronically Signed   By: Corlis Leak M.D.   On: 10/17/2015 07:43   Ct Head Wo Contrast  10/18/2015  CLINICAL DATA:  Altered mental status. On blood thinners. Evaluate for bleed. EXAM: CT HEAD WITHOUT CONTRAST TECHNIQUE: Contiguous axial images were obtained from the base of the skull through the vertex without intravenous contrast. COMPARISON:  None FINDINGS: Sinuses/Soft tissues: Mild mucosal thickening of ethmoid air cells and sphenoid sinuses. Clear mastoid air cells. Intracranial: Cerebral atrophy.  Vertebral and carotid atherosclerosis. Mild low density in the periventricular white matter likely related to small vessel disease. No mass lesion, hemorrhage, hydrocephalus, acute infarct, intra-axial, or extra-axial fluid collection. IMPRESSION: 1.  No acute intracranial abnormality. 2.  Cerebral atrophy and small vessel ischemic change. 3. Sinus disease. Electronically Signed   By: Jeronimo GreavesKyle  Talbot M.D.   On: 10/18/2015 21:44   Dg Chest Port 1 View  10/19/2015  CLINICAL DATA:  Chest pain and coronary artery disease. EXAM: PORTABLE CHEST 1 VIEW COMPARISON:  10/17/2015 FINDINGS: Stable mild cardiac enlargement status post prior  CABG. Lungs show stable bibasilar scarring and atelectasis. There is no evidence of pulmonary edema, consolidation, pneumothorax, nodule or pleural fluid. IMPRESSION: Stable chest x-ray.  Stable bibasilar scarring/atelectasis. Electronically Signed   By: Irish LackGlenn  Yamagata M.D.   On: 10/19/2015 07:53    Assessment & Plan   1. USA/Accelerated Angina/CAD: S/p cath revealing severe multivessel native and graft dzs. Seen by CT surgery with plan for redo CABG on Friday 4/28 following brilinta wash-out.No further CP on IV ntg. Cont asa, heparin, ? blocker, acei. Not on statin 2/2 h/o intolerances - has tried lipitor, zocor, lovastatin in the past - all causing myalgias. Added Zetia.  2. Hypertensive heart Dzs: BP fairly well controlled.  Follow on IV NTG. Cont ? blocker, CCB, and acei.   3. HL: As above, intolerant to multiple statins in the past. Zetia added. Recommend referral to lipid clinic at d/c for evaluation for PCSK 9 drug  4. DM II: Stable. Cont insulin/ssi.  5. CKD III: Stable post-cath @ 1.3. Continue to follow.  6. Thyroid Mass: TSH 6.676. Was being evaluated for thyroid bx  this is on hold in the setting of above.  7. Morbid Obesity: plan cardiac rehab post-op.           Tyler ReichertURNER,Tyler R, MD  10/19/2015  10:35 AM

## 2015-10-19 NOTE — Progress Notes (Signed)
Pt. refused BiPAP @ this time, pending order for ABG for 10/19/2015 @ 0500 while on BiPAP will be held, one was  obtained on 10/18/2015 @ 1400 while on 3 lpm n/c.

## 2015-10-19 NOTE — Progress Notes (Signed)
RN called in by pt. Pt c/o of 6/10 lt sided cp & 10/10 h/a. Pt's nitro drip increased to 15 mcg from 10 mcg. Pt also given Tylenol for the H/a. VS stable & charted. Will continue to monitor the pt. Sanda LingerMilam, Fawna Cranmer R, RN

## 2015-10-19 NOTE — Progress Notes (Signed)
Name: Tyler DeutscherDwight E Spearman MRN: [id number] DOB: 11/02/1945    ADMISSION DATE:  10/16/2015 CONSULTATION DATE:  10/18/15  REFERRING MD : Donata ClayVan Trigt, MD  CHIEF COMPLAINT:  Hypercapnia  BRIEF PATIENT DESCRIPTION: 70 yr old obese, needs redo CABG, called for Acute on chronic resp acidosis  SIGNIFICANT EVENTS  angina  STUDIES:  PFT 4/25- restriction lung dz, low DLCO diffusion, r/o mixed   HISTORY OF PRESENT ILLNESS: Tyler Le is a 70 y.o. male h/o f known ischemic heart disease. He underwent coronary artery bypass graft surgery in 2001. In March 2012 he underwent bare-metal stent placement to the saphenous vein graft to the intermediate and obtuse marginal. The patient was seen in December 2015 complaining of more frequent chest pain Cardiac catheterization was recommended. On 10/16/15 patient underwent cardiac catheterization which showed Severe 3 vessel obstructive CAD IN hospital assessed BY CVTS, needs redo CABG Friday with unstable angina. Also noted bilateral high grade cartoid dz, planned endarterectomy with CABG. Called to assess hypercarbic acidosis. He has snored for years, refused sleep study. Refused BIPAP in past. He had a much higehr bicarb years ago then he has now with better compensation than he has now. No diarhea or known RTA. He is in no distress, reports no SOB. PFT done, restriction, dlco noted down, concern some mixed?Marland Kitchen. opast smoker   SUBJECTIVE: denies CP,SOB BiPAP was ordered but not placed last night, apparently patient refused around  10 PM  VITAL SIGNS: Temp:  [98 F (36.7 C)-98.9 F (37.2 C)] 98.2 F (36.8 C) (04/27 0748) Pulse Rate:  [92-103] 96 (04/27 0752) Resp:  [15-22] 15 (04/27 0752) BP: (127-152)/(68-92) 132/68 mmHg (04/27 0909) SpO2:  [92 %-97 %] 96 % (04/27 0752)  PHYSICAL EXAMINATION: General:  In bed, no distress, wide awake Neuro:  Awake, nonfocal  HEENT:  Obese, mild jvd Cardiovascular:  s1 s2 RRR Lungs:  Distant but clear,  Abdomen:   Obese, NT, ND, no r/g Musculoskeletal:  No sig edema Skin:  Mild hyperpigmentation    Recent Labs Lab 10/16/15 1410 10/17/15 0657 10/18/15 0535  NA  --  137 134*  K  --  4.5 4.5  CL  --  101 100*  CO2  --  26 24  BUN  --  18 14  CREATININE 1.51* 1.50* 1.39*  GLUCOSE  --  152* 140*    Recent Labs Lab 10/16/15 1410 10/18/15 0535  HGB 12.8* 12.8*  HCT 39.3 38.6*  WBC 5.4 6.5  PLT 134* 137*   Ct Head Wo Contrast  10/18/2015  CLINICAL DATA:  Altered mental status. On blood thinners. Evaluate for bleed. EXAM: CT HEAD WITHOUT CONTRAST TECHNIQUE: Contiguous axial images were obtained from the base of the skull through the vertex without intravenous contrast. COMPARISON:  None FINDINGS: Sinuses/Soft tissues: Mild mucosal thickening of ethmoid air cells and sphenoid sinuses. Clear mastoid air cells. Intracranial: Cerebral atrophy. Vertebral and carotid atherosclerosis. Mild low density in the periventricular white matter likely related to small vessel disease. No mass lesion, hemorrhage, hydrocephalus, acute infarct, intra-axial, or extra-axial fluid collection. IMPRESSION: 1.  No acute intracranial abnormality. 2.  Cerebral atrophy and small vessel ischemic change. 3. Sinus disease. Electronically Signed   By: Jeronimo GreavesKyle  Talbot M.D.   On: 10/18/2015 21:44   Dg Chest Port 1 View  10/19/2015  CLINICAL DATA:  Chest pain and coronary artery disease. EXAM: PORTABLE CHEST 1 VIEW COMPARISON:  10/17/2015 FINDINGS: Stable mild cardiac enlargement status post prior CABG. Lungs show stable bibasilar scarring  and atelectasis. There is no evidence of pulmonary edema, consolidation, pneumothorax, nodule or pleural fluid. IMPRESSION: Stable chest x-ray.  Stable bibasilar scarring/atelectasis. Electronically Signed   By: Irish Lack M.D.   On: 10/19/2015 07:53    ASSESSMENT / PLAN:  Angina Multivessel CAD requiring redo CABG Bilateral carotid stenosis without tia, cva Presumed OSA OHS Medical  noncompliance with NIMV in past Lower compensatory bicarb on chemistry then in past Acute on small chronic resp acidosis (likely baseline co2 about 38) PFTs show severe Restrictive lung dz (obesity), low DLCO( pulm congestion?) CT chest neg ILD 3/1  -discussed with patient need for nocturnal BiPAP -start BIPAP low settings 10/5  ( to improve compliance) nocturnal only- repeat ABG in the morning on room air -he is at about average risk for  Postop pulmonary complications including ventilator dependence. I am not sure we can decrease this risk in the short-term. I have emphasized BiPAP compliance to him & his wife   Cyril Mourning MD. FCCP. [facility] Pager [phone] If no response call [phone]   10/19/2015        10/19/2015, 10:09 AM

## 2015-10-19 NOTE — Progress Notes (Signed)
Patient ID: [name], male   DOB: 03-29-1946, 70 y.o.   MRN: [id number] Discussed plans with patient for combined right carotid endarterectomy tomorrow and Dr.Van Trigt  we'll proceed with redo coronary artery bypass grafting Risks of possible intraoperative CVA or discussed again today patient understands but would like to proceed with this plan

## 2015-10-20 ENCOUNTER — Encounter (HOSPITAL_COMMUNITY)
Admission: RE | Disposition: A | Discharge status: Skilled Nursing Facility | Payer: Self-pay | Source: Ambulatory Visit | Attending: Cardiothoracic Surgery

## 2015-10-20 ENCOUNTER — Encounter (HOSPITAL_COMMUNITY): Payer: Self-pay | Admitting: Certified Registered Nurse Anesthetist

## 2015-10-20 ENCOUNTER — Inpatient Hospital Stay (HOSPITAL_COMMUNITY): Payer: Medicare Other

## 2015-10-20 ENCOUNTER — Inpatient Hospital Stay (HOSPITAL_COMMUNITY): Payer: Medicare Other | Admitting: Anesthesiology

## 2015-10-20 DIAGNOSIS — Z951 Presence of aortocoronary bypass graft: Secondary | ICD-10-CM

## 2015-10-20 DIAGNOSIS — I25110 Atherosclerotic heart disease of native coronary artery with unstable angina pectoris: Secondary | ICD-10-CM

## 2015-10-20 HISTORY — PX: CORONARY ARTERY BYPASS GRAFT: SHX141

## 2015-10-20 HISTORY — PX: TEE WITHOUT CARDIOVERSION: SHX5443

## 2015-10-20 HISTORY — PX: ENDARTERECTOMY: SHX5162

## 2015-10-20 LAB — POCT I-STAT 3, ART BLOOD GAS (G3+)
ACID-BASE DEFICIT: 1 mmol/L (ref 0.0–2.0)
ACID-BASE EXCESS: 5 mmol/L — AB (ref 0.0–2.0)
ACID-BASE EXCESS: 1 mmol/L (ref 0.0–2.0)
BICARBONATE: 30.5 meq/L — AB (ref 20.0–24.0)
BICARBONATE: 26.7 meq/L — AB (ref 20.0–24.0)
Bicarbonate: 26.1 mEq/L — ABNORMAL HIGH (ref 20.0–24.0)
Bicarbonate: 26.2 mEq/L — ABNORMAL HIGH (ref 20.0–24.0)
O2 SAT: 94 %
O2 Saturation: 100 %
O2 Saturation: 93 %
O2 Saturation: 99 %
PCO2 ART: 41 mmHg (ref 35.0–45.0)
PCO2 ART: 46.1 mmHg — AB (ref 35.0–45.0)
PH ART: 7.288 — AB (ref 7.350–7.450)
PH ART: 7.363 (ref 7.350–7.450)
TCO2: 32 mmol/L (ref 0–100)
TCO2: 27 mmol/L (ref 0–100)
TCO2: 28 mmol/L (ref 0–100)
TCO2: 28 mmol/L (ref 0–100)
pCO2 arterial: 47.1 mmHg — ABNORMAL HIGH (ref 35.0–45.0)
pCO2 arterial: 55.1 mmHg — ABNORMAL HIGH (ref 35.0–45.0)
pH, Arterial: 7.419 (ref 7.350–7.450)
pH, Arterial: 7.411 (ref 7.350–7.450)
pO2, Arterial: 347 mmHg — ABNORMAL HIGH (ref 80.0–100.0)
pO2, Arterial: 65 mmHg — ABNORMAL LOW (ref 80.0–100.0)
pO2, Arterial: 74 mmHg — ABNORMAL LOW (ref 80.0–100.0)
pO2, Arterial: 137 mmHg — ABNORMAL HIGH (ref 80.0–100.0)

## 2015-10-20 LAB — POCT I-STAT, CHEM 8
BUN: 16 mg/dL (ref 6–20)
BUN: 13 mg/dL (ref 6–20)
BUN: 13 mg/dL (ref 6–20)
BUN: 13 mg/dL (ref 6–20)
BUN: 13 mg/dL (ref 6–20)
BUN: 13 mg/dL (ref 6–20)
CALCIUM ION: 1 mmol/L — AB (ref 1.13–1.30)
CALCIUM ION: 1.16 mmol/L (ref 1.13–1.30)
CALCIUM ION: 1 mmol/L — AB (ref 1.13–1.30)
CHLORIDE: 98 mmol/L — AB (ref 101–111)
CREATININE: 1.2 mg/dL (ref 0.61–1.24)
CREATININE: 0.9 mg/dL (ref 0.61–1.24)
Calcium, Ion: 1.23 mmol/L (ref 1.13–1.30)
Calcium, Ion: 1.02 mmol/L — ABNORMAL LOW (ref 1.13–1.30)
Calcium, Ion: 1.24 mmol/L (ref 1.13–1.30)
Chloride: 100 mmol/L — ABNORMAL LOW (ref 101–111)
Chloride: 100 mmol/L — ABNORMAL LOW (ref 101–111)
Chloride: 98 mmol/L — ABNORMAL LOW (ref 101–111)
Chloride: 95 mmol/L — ABNORMAL LOW (ref 101–111)
Chloride: 98 mmol/L — ABNORMAL LOW (ref 101–111)
Creatinine, Ser: 1 mg/dL (ref 0.61–1.24)
Creatinine, Ser: 0.9 mg/dL (ref 0.61–1.24)
Creatinine, Ser: 0.9 mg/dL (ref 0.61–1.24)
Creatinine, Ser: 0.9 mg/dL (ref 0.61–1.24)
GLUCOSE: 103 mg/dL — AB (ref 65–99)
GLUCOSE: 148 mg/dL — AB (ref 65–99)
Glucose, Bld: 151 mg/dL — ABNORMAL HIGH (ref 65–99)
Glucose, Bld: 135 mg/dL — ABNORMAL HIGH (ref 65–99)
Glucose, Bld: 139 mg/dL — ABNORMAL HIGH (ref 65–99)
Glucose, Bld: 189 mg/dL — ABNORMAL HIGH (ref 65–99)
HCT: 25 % — ABNORMAL LOW (ref 39.0–52.0)
HCT: 26 % — ABNORMAL LOW (ref 39.0–52.0)
HEMATOCRIT: 34 % — AB (ref 39.0–52.0)
HEMATOCRIT: 26 % — AB (ref 39.0–52.0)
HEMATOCRIT: 27 % — AB (ref 39.0–52.0)
HEMATOCRIT: 30 % — AB (ref 39.0–52.0)
HEMOGLOBIN: 11.6 g/dL — AB (ref 13.0–17.0)
HEMOGLOBIN: 8.5 g/dL — AB (ref 13.0–17.0)
HEMOGLOBIN: 8.8 g/dL — AB (ref 13.0–17.0)
HEMOGLOBIN: 9.2 g/dL — AB (ref 13.0–17.0)
Hemoglobin: 8.8 g/dL — ABNORMAL LOW (ref 13.0–17.0)
Hemoglobin: 10.2 g/dL — ABNORMAL LOW (ref 13.0–17.0)
POTASSIUM: 3.9 mmol/L (ref 3.5–5.1)
Potassium: 4.3 mmol/L (ref 3.5–5.1)
Potassium: 4.2 mmol/L (ref 3.5–5.1)
Potassium: 4.3 mmol/L (ref 3.5–5.1)
Potassium: 4.3 mmol/L (ref 3.5–5.1)
Potassium: 4.1 mmol/L (ref 3.5–5.1)
SODIUM: 138 mmol/L (ref 135–145)
SODIUM: 133 mmol/L — AB (ref 135–145)
SODIUM: 134 mmol/L — AB (ref 135–145)
SODIUM: 136 mmol/L (ref 135–145)
Sodium: 137 mmol/L (ref 135–145)
Sodium: 135 mmol/L (ref 135–145)
TCO2: 25 mmol/L (ref 0–100)
TCO2: 26 mmol/L (ref 0–100)
TCO2: 27 mmol/L (ref 0–100)
TCO2: 28 mmol/L (ref 0–100)
TCO2: 27 mmol/L (ref 0–100)
TCO2: 24 mmol/L (ref 0–100)

## 2015-10-20 LAB — CBC
HEMATOCRIT: 33.9 % — AB (ref 39.0–52.0)
HEMATOCRIT: 26.7 % — AB (ref 39.0–52.0)
HEMOGLOBIN: 11.2 g/dL — AB (ref 13.0–17.0)
Hemoglobin: 9.1 g/dL — ABNORMAL LOW (ref 13.0–17.0)
MCH: 30 pg (ref 26.0–34.0)
MCH: 30.5 pg (ref 26.0–34.0)
MCHC: 33 g/dL (ref 30.0–36.0)
MCHC: 34.1 g/dL (ref 30.0–36.0)
MCV: 90.9 fL (ref 78.0–100.0)
MCV: 89.6 fL (ref 78.0–100.0)
Platelets: 109 10*3/uL — ABNORMAL LOW (ref 150–400)
Platelets: 95 10*3/uL — ABNORMAL LOW (ref 150–400)
RBC: 3.73 MIL/uL — ABNORMAL LOW (ref 4.22–5.81)
RBC: 2.98 MIL/uL — ABNORMAL LOW (ref 4.22–5.81)
RDW: 13.9 % (ref 11.5–15.5)
RDW: 13.7 % (ref 11.5–15.5)
WBC: 4.7 10*3/uL (ref 4.0–10.5)
WBC: 6.2 10*3/uL (ref 4.0–10.5)

## 2015-10-20 LAB — POCT I-STAT 4, (NA,K, GLUC, HGB,HCT)
Glucose, Bld: 156 mg/dL — ABNORMAL HIGH (ref 65–99)
Glucose, Bld: 165 mg/dL — ABNORMAL HIGH (ref 65–99)
HCT: 30 % — ABNORMAL LOW (ref 39.0–52.0)
HEMATOCRIT: 28 % — AB (ref 39.0–52.0)
HEMOGLOBIN: 9.5 g/dL — AB (ref 13.0–17.0)
Hemoglobin: 10.2 g/dL — ABNORMAL LOW (ref 13.0–17.0)
POTASSIUM: 4 mmol/L (ref 3.5–5.1)
POTASSIUM: 3.9 mmol/L (ref 3.5–5.1)
SODIUM: 135 mmol/L (ref 135–145)
Sodium: 137 mmol/L (ref 135–145)

## 2015-10-20 LAB — BASIC METABOLIC PANEL
Anion gap: 10 (ref 5–15)
BUN: 15 mg/dL (ref 6–20)
CO2: 25 mmol/L (ref 22–32)
Calcium: 8.5 mg/dL — ABNORMAL LOW (ref 8.9–10.3)
Chloride: 101 mmol/L (ref 101–111)
Creatinine, Ser: 1.39 mg/dL — ABNORMAL HIGH (ref 0.61–1.24)
GFR calc Af Amer: 57 mL/min — ABNORMAL LOW (ref >60)
GFR calc non Af Amer: 49 mL/min — ABNORMAL LOW (ref >60)
Glucose, Bld: 130 mg/dL — ABNORMAL HIGH (ref 65–99)
Potassium: 3.9 mmol/L (ref 3.5–5.1)
Sodium: 136 mmol/L (ref 135–145)

## 2015-10-20 LAB — BLOOD GAS, ARTERIAL
Acid-Base Excess: 2.8 mmol/L — ABNORMAL HIGH (ref 0.0–2.0)
Bicarbonate: 27.7 mEq/L — ABNORMAL HIGH (ref 20.0–24.0)
Drawn by: 225631
FIO2: 0.21
O2 Saturation: 87.5 %
Patient temperature: 98.6
TCO2: 29.2 mmol/L (ref 0–100)
pCO2 arterial: 49.8 mmHg — ABNORMAL HIGH (ref 35.0–45.0)
pH, Arterial: 7.364 (ref 7.350–7.450)
pO2, Arterial: 54.1 mmHg — ABNORMAL LOW (ref 80.0–100.0)

## 2015-10-20 LAB — HEMOGLOBIN A1C
Hgb A1c MFr Bld: 7.1 % — ABNORMAL HIGH (ref 4.8–5.6)
Mean Plasma Glucose: 157 mg/dL

## 2015-10-20 LAB — PREPARE RBC (CROSSMATCH)

## 2015-10-20 LAB — HEMOGLOBIN AND HEMATOCRIT, BLOOD
HCT: 26 % — ABNORMAL LOW (ref 39.0–52.0)
Hemoglobin: 8.8 g/dL — ABNORMAL LOW (ref 13.0–17.0)

## 2015-10-20 LAB — GLUCOSE, CAPILLARY
GLUCOSE-CAPILLARY: 129 mg/dL — AB (ref 65–99)
Glucose-Capillary: 175 mg/dL — ABNORMAL HIGH (ref 65–99)
Glucose-Capillary: 185 mg/dL — ABNORMAL HIGH (ref 65–99)

## 2015-10-20 LAB — FIBRINOGEN: Fibrinogen: 302 mg/dL (ref 204–475)

## 2015-10-20 LAB — APTT: aPTT: 31 seconds (ref 24–37)

## 2015-10-20 LAB — PLATELET COUNT: Platelets: 63 10*3/uL — ABNORMAL LOW (ref 150–400)

## 2015-10-20 LAB — PROTIME-INR
INR: 1.35 (ref 0.00–1.49)
Prothrombin Time: 16.8 seconds — ABNORMAL HIGH (ref 11.6–15.2)

## 2015-10-20 SURGERY — REDO CORONARY ARTERY BYPASS GRAFTING (CABG)
Anesthesia: General | Site: Chest | Laterality: Right

## 2015-10-20 MED ORDER — MILRINONE LACTATE IN DEXTROSE 20-5 MG/100ML-% IV SOLN
0.1250 ug/kg/min | INTRAVENOUS | Status: DC
  Administered 2015-10-20 – 2015-10-22 (×4): 0.25 ug/kg/min via INTRAVENOUS
  Administered 2015-10-23 – 2015-10-31 (×10): 0.125 ug/kg/min via INTRAVENOUS
  Filled 2015-10-20 (×16): qty 100

## 2015-10-20 MED ORDER — DEXTROSE 5 % IV SOLN
1.5000 g | Freq: Two times a day (BID) | INTRAVENOUS | Status: AC
  Administered 2015-10-21 – 2015-10-22 (×4): 1.5 g via INTRAVENOUS
  Filled 2015-10-20 (×4): qty 1.5

## 2015-10-20 MED ORDER — METOPROLOL TARTRATE 5 MG/5ML IV SOLN
2.5000 mg | INTRAVENOUS | Status: DC | PRN
  Administered 2015-10-30: 5 mg via INTRAVENOUS
  Administered 2015-10-31: 2.5 mg via INTRAVENOUS
  Filled 2015-10-20 (×2): qty 5

## 2015-10-20 MED ORDER — LIDOCAINE 2% (20 MG/ML) 5 ML SYRINGE
INTRAMUSCULAR | Status: AC
  Filled 2015-10-20: qty 5

## 2015-10-20 MED ORDER — OXYCODONE HCL 5 MG PO TABS: 5.0000 mg | ORAL_TABLET | ORAL | Status: DC | PRN

## 2015-10-20 MED ORDER — PHENYLEPHRINE 40 MCG/ML (10ML) SYRINGE FOR IV PUSH (FOR BLOOD PRESSURE SUPPORT)
PREFILLED_SYRINGE | INTRAVENOUS | Status: AC
  Filled 2015-10-20: qty 10

## 2015-10-20 MED ORDER — FENTANYL CITRATE (PF) 100 MCG/2ML IJ SOLN
INTRAMUSCULAR | Status: DC | PRN
  Administered 2015-10-20: 100 ug via INTRAVENOUS
  Administered 2015-10-20: 250 ug via INTRAVENOUS
  Administered 2015-10-20 (×2): 150 ug via INTRAVENOUS
  Administered 2015-10-20: 100 ug via INTRAVENOUS
  Administered 2015-10-20: 25 ug via INTRAVENOUS
  Administered 2015-10-20: 75 ug via INTRAVENOUS
  Administered 2015-10-20 (×3): 50 ug via INTRAVENOUS
  Administered 2015-10-20: 100 ug via INTRAVENOUS
  Administered 2015-10-20: 50 ug via INTRAVENOUS
  Administered 2015-10-20: 100 ug via INTRAVENOUS
  Administered 2015-10-20: 150 ug via INTRAVENOUS

## 2015-10-20 MED ORDER — SODIUM CHLORIDE 0.9 % IV SOLN
250.0000 [IU] | INTRAVENOUS | Status: DC | PRN
  Administered 2015-10-20: .9 [IU]/h via INTRAVENOUS

## 2015-10-20 MED ORDER — PANTOPRAZOLE SODIUM 40 MG PO TBEC
40.0000 mg | DELAYED_RELEASE_TABLET | Freq: Every day | ORAL | Status: DC
  Administered 2015-10-23 – 2015-10-25 (×3): 40 mg via ORAL
  Filled 2015-10-20 (×3): qty 1

## 2015-10-20 MED ORDER — FENTANYL CITRATE (PF) 250 MCG/5ML IJ SOLN
INTRAMUSCULAR | Status: AC
  Filled 2015-10-20: qty 5

## 2015-10-20 MED ORDER — PHENYLEPHRINE HCL 10 MG/ML IJ SOLN
INTRAMUSCULAR | Status: DC | PRN
  Administered 2015-10-20 (×4): 80 ug via INTRAVENOUS

## 2015-10-20 MED ORDER — SODIUM CHLORIDE 0.9% FLUSH: 3.0000 mL | INTRAVENOUS | Status: DC | PRN

## 2015-10-20 MED ORDER — ALBUMIN HUMAN 5 % IV SOLN: 250.0000 mL | INTRAVENOUS | Status: DC | PRN

## 2015-10-20 MED ORDER — SUCCINYLCHOLINE CHLORIDE 200 MG/10ML IV SOSY
PREFILLED_SYRINGE | INTRAVENOUS | Status: AC
  Filled 2015-10-20: qty 20

## 2015-10-20 MED ORDER — ALBUMIN HUMAN 5 % IV SOLN
INTRAVENOUS | Status: DC | PRN
  Administered 2015-10-20 (×3): via INTRAVENOUS

## 2015-10-20 MED ORDER — LACTATED RINGERS IV SOLN: INTRAVENOUS | Status: DC

## 2015-10-20 MED ORDER — ACETAMINOPHEN 650 MG RE SUPP
650.0000 mg | Freq: Once | RECTAL | Status: AC
  Administered 2015-10-20: 650 mg via RECTAL

## 2015-10-20 MED ORDER — PROPOFOL 10 MG/ML IV BOLUS
INTRAVENOUS | Status: DC | PRN
  Administered 2015-10-20: 30 mg via INTRAVENOUS

## 2015-10-20 MED ORDER — ROCURONIUM BROMIDE 50 MG/5ML IV SOLN
INTRAVENOUS | Status: AC
  Filled 2015-10-20: qty 1

## 2015-10-20 MED ORDER — MIDAZOLAM HCL 2 MG/2ML IJ SOLN
2.0000 mg | INTRAMUSCULAR | Status: DC | PRN
  Administered 2015-10-20 – 2015-10-21 (×7): 2 mg via INTRAVENOUS
  Filled 2015-10-20 (×7): qty 2

## 2015-10-20 MED ORDER — ONDANSETRON HCL 4 MG/2ML IJ SOLN
INTRAMUSCULAR | Status: AC
  Filled 2015-10-20: qty 2

## 2015-10-20 MED ORDER — MORPHINE SULFATE (PF) 2 MG/ML IV SOLN
1.0000 mg | INTRAVENOUS | Status: AC | PRN
Start: 2015-10-20 — End: 2015-10-21

## 2015-10-20 MED ORDER — VANCOMYCIN HCL IN DEXTROSE 1-5 GM/200ML-% IV SOLN
1000.0000 mg | Freq: Once | INTRAVENOUS | Status: AC
Start: 2015-10-20 — End: 2015-10-20
  Administered 2015-10-20: 1000 mg via INTRAVENOUS
  Filled 2015-10-20: qty 200

## 2015-10-20 MED ORDER — ACETAMINOPHEN 160 MG/5ML PO SOLN
1000.0000 mg | Freq: Four times a day (QID) | ORAL | Status: AC
  Administered 2015-10-20 – 2015-10-25 (×18): 1000 mg
  Filled 2015-10-20 (×18): qty 40.6

## 2015-10-20 MED ORDER — EPINEPHRINE HCL 1 MG/ML IJ SOLN
0.0000 ug/min | INTRAVENOUS | Status: DC
  Filled 2015-10-20: qty 4

## 2015-10-20 MED ORDER — SODIUM CHLORIDE 0.9 % IV SOLN
Freq: Once | INTRAVENOUS | Status: DC
Start: 2015-10-20 — End: 2015-10-23

## 2015-10-20 MED ORDER — ACETAMINOPHEN 160 MG/5ML PO SOLN: 650.0000 mg | Freq: Once | ORAL | Status: AC

## 2015-10-20 MED ORDER — NITROGLYCERIN IN D5W 200-5 MCG/ML-% IV SOLN: 0.0000 ug/min | INTRAVENOUS | Status: DC

## 2015-10-20 MED ORDER — BISACODYL 5 MG PO TBEC
10.0000 mg | DELAYED_RELEASE_TABLET | Freq: Every day | ORAL | Status: DC
  Administered 2015-10-28 – 2015-11-14 (×6): 10 mg via ORAL
  Filled 2015-10-20 (×7): qty 2

## 2015-10-20 MED ORDER — MIDAZOLAM HCL 5 MG/5ML IJ SOLN
INTRAMUSCULAR | Status: DC | PRN
  Administered 2015-10-20: 1 mg via INTRAVENOUS
  Administered 2015-10-20: 2 mg via INTRAVENOUS
  Administered 2015-10-20: 1 mg via INTRAVENOUS

## 2015-10-20 MED ORDER — METOPROLOL TARTRATE 25 MG/10 ML ORAL SUSPENSION
12.5000 mg | Freq: Two times a day (BID) | ORAL | Status: DC
  Administered 2015-10-22 – 2015-11-01 (×15): 12.5 mg
  Filled 2015-10-20 (×14): qty 5

## 2015-10-20 MED ORDER — PROTAMINE SULFATE 10 MG/ML IV SOLN
INTRAVENOUS | Status: AC
  Filled 2015-10-20: qty 25

## 2015-10-20 MED ORDER — HEPARIN SODIUM (PORCINE) 1000 UNIT/ML IJ SOLN
INTRAMUSCULAR | Status: DC | PRN
  Administered 2015-10-20: 28000 [IU] via INTRAVENOUS
  Administered 2015-10-20: 7000 [IU] via INTRAVENOUS

## 2015-10-20 MED ORDER — CHLORHEXIDINE GLUCONATE 0.12% ORAL RINSE (MEDLINE KIT)
15.0000 mL | Freq: Two times a day (BID) | OROMUCOSAL | Status: DC
  Administered 2015-10-21 – 2015-11-14 (×39): 15 mL via OROMUCOSAL

## 2015-10-20 MED ORDER — POTASSIUM CHLORIDE 10 MEQ/50ML IV SOLN
10.0000 meq | INTRAVENOUS | Status: AC
  Administered 2015-10-20 (×3): 10 meq via INTRAVENOUS

## 2015-10-20 MED ORDER — DOPAMINE-DEXTROSE 3.2-5 MG/ML-% IV SOLN
3.0000 ug/kg/min | INTRAVENOUS | Status: DC
  Administered 2015-10-21: 5 ug/kg/min via INTRAVENOUS
  Administered 2015-10-24: 3 ug/kg/min via INTRAVENOUS
  Filled 2015-10-20 (×3): qty 250

## 2015-10-20 MED ORDER — ASPIRIN EC 325 MG PO TBEC
325.0000 mg | DELAYED_RELEASE_TABLET | Freq: Every day | ORAL | Status: DC
  Administered 2015-10-28 – 2015-11-14 (×8): 325 mg via ORAL
  Filled 2015-10-20 (×9): qty 1

## 2015-10-20 MED ORDER — MIDAZOLAM HCL 10 MG/2ML IJ SOLN
INTRAMUSCULAR | Status: AC
  Filled 2015-10-20: qty 2

## 2015-10-20 MED ORDER — TRAMADOL HCL 50 MG PO TABS
50.0000 mg | ORAL_TABLET | ORAL | Status: DC | PRN
  Administered 2015-11-04 (×2): 100 mg via ORAL
  Administered 2015-11-04: 50 mg via ORAL
  Administered 2015-11-05 – 2015-11-14 (×17): 100 mg via ORAL
  Filled 2015-10-20 (×17): qty 2
  Filled 2015-10-20: qty 1
  Filled 2015-10-20 (×3): qty 2

## 2015-10-20 MED ORDER — SODIUM CHLORIDE 0.9 % IJ SOLN
INTRAMUSCULAR | Status: AC
  Filled 2015-10-20: qty 10

## 2015-10-20 MED ORDER — LACTATED RINGERS IV SOLN
INTRAVENOUS | Status: DC | PRN
  Administered 2015-10-20 (×4): via INTRAVENOUS

## 2015-10-20 MED ORDER — SODIUM CHLORIDE 0.45 % IV SOLN
INTRAVENOUS | Status: DC | PRN
  Administered 2015-10-29: 50 mL/h via INTRAVENOUS

## 2015-10-20 MED ORDER — DOCUSATE SODIUM 100 MG PO CAPS
200.0000 mg | ORAL_CAPSULE | Freq: Every day | ORAL | Status: DC
  Administered 2015-11-07: 200 mg via ORAL
  Filled 2015-10-20 (×3): qty 2

## 2015-10-20 MED ORDER — ASPIRIN 81 MG PO CHEW
324.0000 mg | CHEWABLE_TABLET | Freq: Every day | ORAL | Status: DC
  Administered 2015-10-21 – 2015-11-11 (×17): 324 mg
  Filled 2015-10-20 (×18): qty 4

## 2015-10-20 MED ORDER — INSULIN REGULAR BOLUS VIA INFUSION
0.0000 [IU] | Freq: Three times a day (TID) | INTRAVENOUS | Status: DC
  Filled 2015-10-20: qty 10

## 2015-10-20 MED ORDER — MILRINONE LACTATE IN DEXTROSE 20-5 MG/100ML-% IV SOLN
0.1250 ug/kg/min | INTRAVENOUS | Status: AC
  Administered 2015-10-20: 0.25 ug/kg/min via INTRAVENOUS
  Filled 2015-10-20: qty 100

## 2015-10-20 MED ORDER — ALBUMIN HUMAN 5 % IV SOLN
250.0000 mL | INTRAVENOUS | Status: AC | PRN
Start: 2015-10-20 — End: 2015-10-21
  Administered 2015-10-21: 250 mL via INTRAVENOUS

## 2015-10-20 MED ORDER — MORPHINE SULFATE (PF) 2 MG/ML IV SOLN
2.0000 mg | INTRAVENOUS | Status: DC | PRN
  Administered 2015-10-21: 4 mg via INTRAVENOUS
  Filled 2015-10-20: qty 2

## 2015-10-20 MED ORDER — METOPROLOL TARTRATE 12.5 MG HALF TABLET
12.5000 mg | ORAL_TABLET | Freq: Two times a day (BID) | ORAL | Status: DC
  Administered 2015-10-31: 12.5 mg via ORAL
  Filled 2015-10-20 (×2): qty 1

## 2015-10-20 MED ORDER — SODIUM CHLORIDE 0.9 % IJ SOLN
INTRAMUSCULAR | Status: DC | PRN
  Administered 2015-10-20 (×5): 4 mL via TOPICAL

## 2015-10-20 MED ORDER — PROPOFOL 10 MG/ML IV BOLUS
INTRAVENOUS | Status: AC
  Filled 2015-10-20: qty 20

## 2015-10-20 MED ORDER — 0.9 % SODIUM CHLORIDE (POUR BTL) OPTIME
TOPICAL | Status: DC | PRN
  Administered 2015-10-20: 6000 mL

## 2015-10-20 MED ORDER — ROCURONIUM BROMIDE 100 MG/10ML IV SOLN
INTRAVENOUS | Status: DC | PRN
  Administered 2015-10-20: 45 mg via INTRAVENOUS
  Administered 2015-10-20: 5 mg via INTRAVENOUS

## 2015-10-20 MED ORDER — SODIUM CHLORIDE 0.9 % IV SOLN
INTRAVENOUS | Status: DC
  Administered 2015-10-21: 3.7 [IU]/h via INTRAVENOUS
  Filled 2015-10-20 (×3): qty 2.5

## 2015-10-20 MED ORDER — VECURONIUM BROMIDE 10 MG IV SOLR
INTRAVENOUS | Status: DC | PRN
  Administered 2015-10-20: 5 mg via INTRAVENOUS
  Administered 2015-10-20: 10 mg via INTRAVENOUS
  Administered 2015-10-20 (×4): 5 mg via INTRAVENOUS
  Administered 2015-10-20: 10 mg via INTRAVENOUS
  Administered 2015-10-20: 5 mg via INTRAVENOUS

## 2015-10-20 MED ORDER — HEMOSTATIC AGENTS (NO CHARGE) OPTIME
TOPICAL | Status: DC | PRN
  Administered 2015-10-20: 1 via TOPICAL

## 2015-10-20 MED ORDER — PHENYLEPHRINE HCL 10 MG/ML IJ SOLN
0.0000 ug/min | INTRAVENOUS | Status: DC
  Administered 2015-10-22: 15 ug/min via INTRAVENOUS
  Administered 2015-10-24 (×3): 100 ug/min via INTRAVENOUS
  Administered 2015-10-24: 40 ug/min via INTRAVENOUS
  Administered 2015-10-25: 0 ug/min via INTRAVENOUS
  Filled 2015-10-20 (×10): qty 2

## 2015-10-20 MED ORDER — DEXMEDETOMIDINE HCL IN NACL 400 MCG/100ML IV SOLN
INTRAVENOUS | Status: DC | PRN
  Administered 2015-10-20: 15:00:00 via INTRAVENOUS
  Administered 2015-10-20: .3 ug/kg/h via INTRAVENOUS

## 2015-10-20 MED ORDER — SODIUM CHLORIDE 0.9 % IV SOLN
INTRAVENOUS | Status: DC
  Administered 2015-10-26: 10 mL/h via INTRAVENOUS
  Administered 2015-10-31 – 2015-11-04 (×2): via INTRAVENOUS

## 2015-10-20 MED ORDER — ACETAMINOPHEN 500 MG PO TABS
1000.0000 mg | ORAL_TABLET | Freq: Four times a day (QID) | ORAL | Status: AC
  Administered 2015-10-22 (×2): 1000 mg via ORAL
  Filled 2015-10-20 (×3): qty 2

## 2015-10-20 MED ORDER — FENTANYL CITRATE (PF) 250 MCG/5ML IJ SOLN
INTRAMUSCULAR | Status: AC
  Filled 2015-10-20: qty 15

## 2015-10-20 MED ORDER — PHENYLEPHRINE HCL 10 MG/ML IJ SOLN
10.0000 mg | INTRAMUSCULAR | Status: DC | PRN
  Administered 2015-10-20: 20 ug/min via INTRAVENOUS

## 2015-10-20 MED ORDER — ONDANSETRON HCL 4 MG/2ML IJ SOLN
4.0000 mg | Freq: Four times a day (QID) | INTRAMUSCULAR | Status: DC | PRN
  Administered 2015-10-28 – 2015-10-31 (×2): 4 mg via INTRAVENOUS

## 2015-10-20 MED ORDER — DEXMEDETOMIDINE HCL IN NACL 400 MCG/100ML IV SOLN
0.4000 ug/kg/h | INTRAVENOUS | Status: DC
  Administered 2015-10-20: 0.7 ug/kg/h via INTRAVENOUS
  Administered 2015-10-21 (×4): 1.2 ug/kg/h via INTRAVENOUS
  Administered 2015-10-21: 0.8 ug/kg/h via INTRAVENOUS
  Administered 2015-10-21: 1 ug/kg/h via INTRAVENOUS
  Administered 2015-10-21 – 2015-10-23 (×14): 1.2 ug/kg/h via INTRAVENOUS
  Filled 2015-10-20: qty 100
  Filled 2015-10-20: qty 50
  Filled 2015-10-20 (×27): qty 100

## 2015-10-20 MED ORDER — SODIUM CHLORIDE 0.9% FLUSH
3.0000 mL | Freq: Two times a day (BID) | INTRAVENOUS | Status: DC
  Administered 2015-10-21 – 2015-11-08 (×12): 3 mL via INTRAVENOUS

## 2015-10-20 MED ORDER — SODIUM CHLORIDE 0.9 % IV SOLN
10.0000 g | INTRAVENOUS | Status: DC | PRN
  Administered 2015-10-20: 5 g/h via INTRAVENOUS

## 2015-10-20 MED ORDER — DEXTROSE 5 % IV SOLN
0.0000 ug/min | INTRAVENOUS | Status: DC
  Filled 2015-10-20: qty 4

## 2015-10-20 MED ORDER — BISACODYL 10 MG RE SUPP
10.0000 mg | Freq: Every day | RECTAL | Status: DC
  Administered 2015-10-24 – 2015-10-25 (×2): 10 mg via RECTAL
  Filled 2015-10-20 (×3): qty 1

## 2015-10-20 MED ORDER — SODIUM CHLORIDE 0.9 % IV SOLN: Freq: Once | INTRAVENOUS | Status: DC

## 2015-10-20 MED ORDER — METOCLOPRAMIDE HCL 5 MG/ML IJ SOLN
10.0000 mg | Freq: Four times a day (QID) | INTRAMUSCULAR | Status: DC
  Administered 2015-10-20 – 2015-10-23 (×12): 10 mg via INTRAVENOUS
  Filled 2015-10-20 (×11): qty 2

## 2015-10-20 MED ORDER — PROTAMINE SULFATE 10 MG/ML IV SOLN
INTRAVENOUS | Status: DC | PRN
  Administered 2015-10-20: 30 mg via INTRAVENOUS

## 2015-10-20 MED ORDER — SODIUM CHLORIDE 0.9 % IV SOLN
INTRAVENOUS | Status: DC | PRN
  Administered 2015-10-20: 08:00:00 via INTRAVENOUS

## 2015-10-20 MED ORDER — SODIUM CHLORIDE 0.9 % IV SOLN
250.0000 mL | INTRAVENOUS | Status: DC
  Administered 2015-10-25: 30 mL/h via INTRAVENOUS
  Administered 2015-11-06: 250 mL via INTRAVENOUS

## 2015-10-20 MED ORDER — FAMOTIDINE IN NACL 20-0.9 MG/50ML-% IV SOLN
20.0000 mg | Freq: Two times a day (BID) | INTRAVENOUS | Status: AC
  Administered 2015-10-20 – 2015-10-21 (×2): 20 mg via INTRAVENOUS
  Filled 2015-10-20 (×2): qty 50

## 2015-10-20 MED ORDER — VECURONIUM BROMIDE 10 MG IV SOLR
INTRAVENOUS | Status: AC
  Filled 2015-10-20: qty 40

## 2015-10-20 MED ORDER — SUCCINYLCHOLINE CHLORIDE 20 MG/ML IJ SOLN
INTRAMUSCULAR | Status: DC | PRN
  Administered 2015-10-20: 140 mg via INTRAVENOUS

## 2015-10-20 MED ORDER — CHLORHEXIDINE GLUCONATE 0.12 % MT SOLN
15.0000 mL | OROMUCOSAL | Status: AC
  Administered 2015-10-20: 15 mL via OROMUCOSAL

## 2015-10-20 MED ORDER — SODIUM CHLORIDE 0.9 % IV SOLN
INTRAVENOUS | Status: DC | PRN
  Administered 2015-10-20: 500 mL

## 2015-10-20 MED ORDER — PHENYLEPHRINE 40 MCG/ML (10ML) SYRINGE FOR IV PUSH (FOR BLOOD PRESSURE SUPPORT)
PREFILLED_SYRINGE | INTRAVENOUS | Status: AC
  Filled 2015-10-20: qty 20

## 2015-10-20 MED ORDER — NITROGLYCERIN 0.2 MG/ML ON CALL CATH LAB
INTRAVENOUS | Status: DC | PRN
Start: 2015-10-20 — End: 2015-10-20
  Administered 2015-10-20: 40 ug via INTRAVENOUS
  Administered 2015-10-20: 160 ug via INTRAVENOUS
  Administered 2015-10-20 (×3): 80 ug via INTRAVENOUS

## 2015-10-20 MED ORDER — HEPARIN SODIUM (PORCINE) 1000 UNIT/ML IJ SOLN
INTRAMUSCULAR | Status: AC
  Filled 2015-10-20: qty 1

## 2015-10-20 MED ORDER — ARTIFICIAL TEARS OP OINT
TOPICAL_OINTMENT | OPHTHALMIC | Status: DC | PRN
  Administered 2015-10-20: 1 via OPHTHALMIC

## 2015-10-20 MED ORDER — MAGNESIUM SULFATE 4 GM/100ML IV SOLN
4.0000 g | Freq: Once | INTRAVENOUS | Status: AC
  Administered 2015-10-20: 4 g via INTRAVENOUS
  Filled 2015-10-20: qty 100

## 2015-10-20 MED ORDER — HEMOSTATIC AGENTS (NO CHARGE) OPTIME
TOPICAL | Status: DC | PRN
  Administered 2015-10-20 (×2): 1 via TOPICAL

## 2015-10-20 MED ORDER — DOPAMINE-DEXTROSE 3.2-5 MG/ML-% IV SOLN
INTRAVENOUS | Status: DC | PRN
  Administered 2015-10-20: 3 ug/kg/min via INTRAVENOUS

## 2015-10-20 MED ORDER — ANTISEPTIC ORAL RINSE SOLUTION (CORINZ)
7.0000 mL | Freq: Four times a day (QID) | OROMUCOSAL | Status: DC
  Administered 2015-10-20 – 2015-11-14 (×78): 7 mL via OROMUCOSAL

## 2015-10-20 MED ORDER — MIDAZOLAM HCL 2 MG/2ML IJ SOLN
INTRAMUSCULAR | Status: AC
  Filled 2015-10-20: qty 2

## 2015-10-20 MED ORDER — LACTATED RINGERS IV SOLN: 500.0000 mL | Freq: Once | INTRAVENOUS | Status: DC | PRN

## 2015-10-20 MED FILL — Magnesium Sulfate Inj 50%: INTRAMUSCULAR | Qty: 10 | Status: AC

## 2015-10-20 MED FILL — Heparin Sodium (Porcine) Inj 1000 Unit/ML: INTRAMUSCULAR | Qty: 30 | Status: AC

## 2015-10-20 MED FILL — Potassium Chloride Inj 2 mEq/ML: INTRAVENOUS | Qty: 40 | Status: AC

## 2015-10-20 SURGICAL SUPPLY — 163 items
ADAPTER CARDIO PERF ANTE/RETRO (ADAPTER) ×2 IMPLANT
ADH SKN CLS LQ APL DERMABOND (GAUZE/BANDAGES/DRESSINGS) ×3
ADPR PRFSN 84XANTGRD RTRGD (ADAPTER) ×3
APPLIER CLIP 9.375 MED OPEN (MISCELLANEOUS)
APPLIER CLIP 9.375 SM OPEN (CLIP)
APR CLP MED 9.3 20 MLT OPN (MISCELLANEOUS)
APR CLP SM 9.3 20 MLT OPN (CLIP)
BAG DECANTER FOR FLEXI CONT (MISCELLANEOUS) ×7 IMPLANT
BANDAGE ELASTIC 4 VELCRO ST LF (GAUZE/BANDAGES/DRESSINGS) ×5 IMPLANT
BANDAGE ELASTIC 6 VELCRO ST LF (GAUZE/BANDAGES/DRESSINGS) ×5 IMPLANT
BIOPATCH RED 1 DISK 7.0 (GAUZE/BANDAGES/DRESSINGS) ×2 IMPLANT
BIOPATCH RED 1IN DISK 7.0MM (GAUZE/BANDAGES/DRESSINGS) ×2
BLADE CORE FAN STRYKER (BLADE) ×8 IMPLANT
BLADE OSCILLATING /SAGITTAL (BLADE) ×2 IMPLANT
BLADE SAW SAG 29X58X.64 (BLADE) ×5 IMPLANT
BLADE STERNUM SYSTEM 6 (BLADE) ×3 IMPLANT
BLADE SURG 11 STRL SS (BLADE) ×2 IMPLANT
BLADE SURG 12 STRL SS (BLADE) ×5 IMPLANT
BLADE SURG ROTATE 9660 (MISCELLANEOUS) ×5 IMPLANT
BNDG GAUZE ELAST 4 BULKY (GAUZE/BANDAGES/DRESSINGS) ×5 IMPLANT
CANISTER SUCTION 2500CC (MISCELLANEOUS) ×10 IMPLANT
CANNULA ARTERIAL NVNT 3/8 22FR (MISCELLANEOUS) ×2 IMPLANT
CANNULA EZ GLIDE 8.0 24FR (CANNULA) ×2 IMPLANT
CANNULA GUNDRY RCSP 15FR (MISCELLANEOUS) IMPLANT
CATH RETROPLEGIA CORONARY 14FR (CATHETERS) ×2 IMPLANT
CATH ROBINSON RED A/P 18FR (CATHETERS) ×15 IMPLANT
CATH SUCT 10FR WHISTLE TIP (CATHETERS) ×5 IMPLANT
CATH THORACIC 28FR (CATHETERS) ×5 IMPLANT
CATH THORACIC 28FR RT ANG (CATHETERS) ×5 IMPLANT
CATH THORACIC 36FR (CATHETERS) ×5 IMPLANT
CATH THORACIC 36FR RT ANG (CATHETERS) ×5 IMPLANT
CLIP APPLIE 9.375 MED OPEN (MISCELLANEOUS) IMPLANT
CLIP APPLIE 9.375 SM OPEN (CLIP) IMPLANT
CLIP FOGARTY SPRING 6M (CLIP) IMPLANT
CLIP RETRACTION 3.0MM CORONARY (MISCELLANEOUS) ×2 IMPLANT
CLIP TI MEDIUM 24 (CLIP) ×5 IMPLANT
CLIP TI WIDE RED SMALL 24 (CLIP) ×5 IMPLANT
CONN Y 3/8X3/8X3/8  BEN (MISCELLANEOUS)
CONN Y 3/8X3/8X3/8 BEN (MISCELLANEOUS) IMPLANT
COVER SURGICAL LIGHT HANDLE (MISCELLANEOUS) ×8 IMPLANT
CRADLE DONUT ADULT HEAD (MISCELLANEOUS) ×6 IMPLANT
DECANTER SPIKE VIAL GLASS SM (MISCELLANEOUS) IMPLANT
DERMABOND ADHESIVE PROPEN (GAUZE/BANDAGES/DRESSINGS) ×2
DERMABOND ADVANCED .7 DNX6 (GAUZE/BANDAGES/DRESSINGS) IMPLANT
DRAIN HEMOVAC 1/8 X 5 (WOUND CARE) IMPLANT
DRAIN JACKSON PRATT 10MM FLAT (MISCELLANEOUS) ×4 IMPLANT
DRAPE CARDIOVASCULAR INCISE (DRAPES) ×5
DRAPE PROXIMA HALF (DRAPES) ×4 IMPLANT
DRAPE SLUSH/WARMER DISC (DRAPES) ×2 IMPLANT
DRAPE SRG 135X102X78XABS (DRAPES) ×3 IMPLANT
DRSG AQUACEL AG ADV 3.5X14 (GAUZE/BANDAGES/DRESSINGS) ×5 IMPLANT
DRSG COVADERM 4X8 (GAUZE/BANDAGES/DRESSINGS) ×2 IMPLANT
ELECT BLADE 6.5 EXT (BLADE) ×5 IMPLANT
ELECT CAUTERY BLADE 6.4 (BLADE) ×2 IMPLANT
ELECT REM PT RETURN 9FT ADLT (ELECTROSURGICAL) ×10
ELECTRODE REM PT RTRN 9FT ADLT (ELECTROSURGICAL) ×9 IMPLANT
EVACUATOR SILICONE 100CC (DRAIN) ×4 IMPLANT
GAUZE SPONGE 4X4 12PLY STRL (GAUZE/BANDAGES/DRESSINGS) ×13 IMPLANT
GAUZE XEROFORM 1X8 LF (GAUZE/BANDAGES/DRESSINGS) ×5 IMPLANT
GLOVE BIO SURGEON STRL SZ 6 (GLOVE) ×3 IMPLANT
GLOVE BIO SURGEON STRL SZ 6.5 (GLOVE) ×6 IMPLANT
GLOVE BIO SURGEON STRL SZ7 (GLOVE) ×11 IMPLANT
GLOVE BIO SURGEON STRL SZ7.5 (GLOVE) ×12 IMPLANT
GLOVE BIO SURGEONS STRL SZ 6.5 (GLOVE) ×3
GLOVE BIOGEL PI IND STRL 6 (GLOVE) ×3 IMPLANT
GLOVE BIOGEL PI IND STRL 6.5 (GLOVE) ×3 IMPLANT
GLOVE BIOGEL PI IND STRL 7.0 (GLOVE) ×3 IMPLANT
GLOVE BIOGEL PI IND STRL 7.5 (GLOVE) IMPLANT
GLOVE BIOGEL PI INDICATOR 6 (GLOVE)
GLOVE BIOGEL PI INDICATOR 6.5 (GLOVE) ×2
GLOVE BIOGEL PI INDICATOR 7.0 (GLOVE) ×4
GLOVE BIOGEL PI INDICATOR 7.5 (GLOVE) ×2
GLOVE ECLIPSE 7.0 STRL STRAW (GLOVE) ×2 IMPLANT
GLOVE EUDERMIC 7 POWDERFREE (GLOVE) ×3 IMPLANT
GLOVE SS BIOGEL STRL SZ 7 (GLOVE) ×3 IMPLANT
GLOVE SUPERSENSE BIOGEL SZ 7 (GLOVE)
GOWN STRL REUS W/ TWL LRG LVL3 (GOWN DISPOSABLE) ×21 IMPLANT
GOWN STRL REUS W/TWL LRG LVL3 (GOWN DISPOSABLE) ×50
GRAFT VASC ANGIOGRAFT 61-70 (Tissue) IMPLANT
HEMOSTAT POWDER SURGIFOAM 1G (HEMOSTASIS) ×10 IMPLANT
HEMOSTAT SURGICEL 2X14 (HEMOSTASIS) ×2 IMPLANT
INSERT FOGARTY 61MM (MISCELLANEOUS) ×3 IMPLANT
INSERT FOGARTY SM (MISCELLANEOUS) ×5 IMPLANT
INSERT FOGARTY XLG (MISCELLANEOUS) ×2 IMPLANT
KIT BASIN OR (CUSTOM PROCEDURE TRAY) ×8 IMPLANT
KIT ROOM TURNOVER OR (KITS) ×8 IMPLANT
KIT SUCTION CATH 14FR (SUCTIONS) ×2 IMPLANT
KIT VASOVIEW 6 PRO VH 2400 (KITS) ×5 IMPLANT
LEAD PACING MYOCARDI (MISCELLANEOUS) ×2 IMPLANT
MARKER GRAFT CORONARY BYPASS (MISCELLANEOUS) ×6 IMPLANT
NEEDLE 22X1 1/2 (OR ONLY) (NEEDLE) IMPLANT
NS IRRIG 1000ML POUR BTL (IV SOLUTION) ×30 IMPLANT
PACK CAROTID (CUSTOM PROCEDURE TRAY) ×3 IMPLANT
PACK OPEN HEART (CUSTOM PROCEDURE TRAY) ×5 IMPLANT
PAD ARMBOARD 7.5X6 YLW CONV (MISCELLANEOUS) ×24 IMPLANT
PAD DEFIB R2 (MISCELLANEOUS) IMPLANT
PAD ELECT DEFIB RADIOL ZOLL (MISCELLANEOUS) ×5 IMPLANT
PATCH HEMASHIELD 8X75 (Vascular Products) ×2 IMPLANT
PENCIL BUTTON HOLSTER BLD 10FT (ELECTRODE) ×2 IMPLANT
PUNCH AORTIC ROTATE  4.5MM 8IN (MISCELLANEOUS) ×2 IMPLANT
PUNCH AORTIC ROTATE 4.0MM (MISCELLANEOUS) IMPLANT
PUNCH AORTIC ROTATE 4.5MM 8IN (MISCELLANEOUS) IMPLANT
PUNCH AORTIC ROTATE 5MM 8IN (MISCELLANEOUS) IMPLANT
SENSOR MYOCARDIAL TEMP (MISCELLANEOUS) ×5 IMPLANT
SET CARDIOPLEGIA MPS 5001102 (MISCELLANEOUS) ×2 IMPLANT
SHUNT CAROTID BYPASS 12FRX15.5 (VASCULAR PRODUCTS) IMPLANT
SOLUTION ANTI FOG 6CC (MISCELLANEOUS) ×5 IMPLANT
SPONGE INTESTINAL PEANUT (DISPOSABLE) ×10 IMPLANT
SPONGE LAP 18X18 X RAY DECT (DISPOSABLE) ×13 IMPLANT
SPONGE LAP 4X18 X RAY DECT (DISPOSABLE) ×5 IMPLANT
STAPLER VISISTAT 35W (STAPLE) ×2 IMPLANT
SURGIFLO TRUKIT (HEMOSTASIS) ×2 IMPLANT
SUT BONE WAX W31G (SUTURE) ×5 IMPLANT
SUT ETHIBOND 2 0 SH (SUTURE) ×20
SUT ETHIBOND 2 0 SH 36X2 (SUTURE) ×3 IMPLANT
SUT MNCRL AB 4-0 PS2 18 (SUTURE) ×7 IMPLANT
SUT PROLENE 3 0 SH 1 (SUTURE) IMPLANT
SUT PROLENE 3 0 SH DA (SUTURE) ×9 IMPLANT
SUT PROLENE 3 0 SH1 36 (SUTURE) ×5 IMPLANT
SUT PROLENE 4 0 RB 1 (SUTURE)
SUT PROLENE 4 0 SH DA (SUTURE) ×5 IMPLANT
SUT PROLENE 4-0 RB1 .5 CRCL 36 (SUTURE) IMPLANT
SUT PROLENE 5 0 C 1 36 (SUTURE) ×5 IMPLANT
SUT PROLENE 6 0 C 1 30 (SUTURE) ×15 IMPLANT
SUT PROLENE 6 0 CC (SUTURE) ×20 IMPLANT
SUT PROLENE 7 0 BV 1 (SUTURE) ×3 IMPLANT
SUT PROLENE 7 0 BV1 MDA (SUTURE) ×3 IMPLANT
SUT PROLENE 7 0 DA (SUTURE) IMPLANT
SUT PROLENE 8 0 BV175 6 (SUTURE) ×2 IMPLANT
SUT PROLENE BLUE 7 0 (SUTURE) ×9 IMPLANT
SUT PROLENE POLY MONO (SUTURE) ×5 IMPLANT
SUT SILK  1 MH (SUTURE) ×2
SUT SILK 1 MH (SUTURE) ×3 IMPLANT
SUT SILK 2 0 FS (SUTURE) ×3 IMPLANT
SUT SILK 2 0 SH CR/8 (SUTURE) ×9 IMPLANT
SUT SILK 3 0 SH CR/8 (SUTURE) ×5 IMPLANT
SUT SILK 3 0 TIES 17X18 (SUTURE)
SUT SILK 3-0 18XBRD TIE BLK (SUTURE) IMPLANT
SUT STEEL 6MS V (SUTURE) IMPLANT
SUT STEEL STERNAL CCS#1 18IN (SUTURE) ×4 IMPLANT
SUT STEEL SZ 6 DBL 3X14 BALL (SUTURE) ×4 IMPLANT
SUT VIC AB 1 CTX 18 (SUTURE) ×2 IMPLANT
SUT VIC AB 1 CTX 36 (SUTURE) ×15
SUT VIC AB 1 CTX36XBRD ANBCTR (SUTURE) IMPLANT
SUT VIC AB 2-0 CT1 27 (SUTURE) ×15
SUT VIC AB 2-0 CT1 TAPERPNT 27 (SUTURE) ×3 IMPLANT
SUT VIC AB 2-0 CTX 27 (SUTURE) IMPLANT
SUT VIC AB 2-0 CTX 36 (SUTURE) ×2 IMPLANT
SUT VIC AB 3-0 SH 27 (SUTURE) ×5
SUT VIC AB 3-0 SH 27X BRD (SUTURE) ×3 IMPLANT
SUT VIC AB 3-0 X1 27 (SUTURE) ×5 IMPLANT
SUTURE E-PAK OPEN HEART (SUTURE) ×5 IMPLANT
SYR CONTROL 10ML LL (SYRINGE) IMPLANT
SYSTEM SAHARA CHEST DRAIN ATS (WOUND CARE) ×5 IMPLANT
TOWEL OR 17X24 6PK STRL BLUE (TOWEL DISPOSABLE) ×5 IMPLANT
TOWEL OR 17X26 10 PK STRL BLUE (TOWEL DISPOSABLE) ×5 IMPLANT
TRAY CATH LUMEN 1 20CM STRL (SET/KITS/TRAYS/PACK) ×2 IMPLANT
TRAY FOLEY IC TEMP SENS 16FR (CATHETERS) ×5 IMPLANT
TUBING INSUFFLATION (TUBING) ×5 IMPLANT
UNDERPAD 30X30 INCONTINENT (UNDERPADS AND DIAPERS) ×5 IMPLANT
VEIN SAPHENOUS CRYO  61-70CM (Tissue) ×2 IMPLANT
VEIN SAPHENOUS CRYO 61-70CM (Tissue) ×3 IMPLANT
WATER STERILE IRR 1000ML POUR (IV SOLUTION) ×13 IMPLANT

## 2015-10-20 NOTE — OR Nursing (Signed)
Forty-five minute call to SICU charge nurse at [phone].

## 2015-10-20 NOTE — Anesthesia Preprocedure Evaluation (Signed)
Anesthesia Evaluation  Patient identified by MRN, date of birth, ID band Patient awake    Reviewed: Allergy & Precautions, H&P , NPO status , Patient's Chart, lab work & pertinent test results  History of Anesthesia Complications Negative for: history of anesthetic complications  Airway Mallampati: II  TM Distance: >3 FB Neck ROM: full    Dental no notable dental hx.    Pulmonary former smoker,    Pulmonary exam normal breath sounds clear to auscultation       Cardiovascular hypertension, Pt. on medications + angina + CAD and + CABG  Normal cardiovascular exam Rhythm:regular Rate:Normal  Echo: EF 55%, no significant aortic or mitral valve disease   Neuro/Psych Anxiety Depression negative neurological ROS     GI/Hepatic Neg liver ROS, GERD  ,  Endo/Other  diabetes, Type 2  Renal/GU CRFRenal disease     Musculoskeletal  (+) Arthritis ,   Abdominal   Peds  Hematology  (+) anemia , thrombocytopenia   Anesthesia Other Findings Currently with hypoxic and hypercarbic respiratory failure per ABG who does not tolerate BiPAP  Reproductive/Obstetrics negative OB ROS                             Anesthesia Physical Anesthesia Plan  ASA: IV  Anesthesia Plan: General   Post-op Pain Management:    Induction: Intravenous  Airway Management Planned: Oral ETT  Additional Equipment: CVP, Arterial line, TEE, PA Cath and Ultrasound Guidance Line Placement  Intra-op Plan:   Post-operative Plan: Post-operative intubation/ventilation  Informed Consent: I have reviewed the patients History and Physical, chart, labs and discussed the procedure including the risks, benefits and alternatives for the proposed anesthesia with the patient or authorized representative who has indicated his/her understanding and acceptance.   Dental Advisory Given  Plan Discussed with: Anesthesiologist, CRNA and  Surgeon  Anesthesia Plan Comments: (Will need LEFT arterial line per doppler studies Will need left IJ introducer given right CEA portion of procedure Will need 4u RBC in room prior to sternal incision, defib pads in place)        Anesthesia Quick Evaluation

## 2015-10-20 NOTE — OR Nursing (Signed)
Twenty minute call to SICU charge nurse at [phone].

## 2015-10-20 NOTE — Anesthesia Postprocedure Evaluation (Signed)
Anesthesia Post Note  Patient: Tyler Le  Procedure(s) Performed: Procedure(s) (LRB): REDO CORONARY ARTERY BYPASS GRAFTING (CABG), TIMES THREE, USING LEFT GREATER SAPHENOUS VEIN HARVESTED ENDOSCOPICALLY AND CRYO VEIN, WITH CORONARY ENDARTERECTOMY (N/A) TRANSESOPHAGEAL ECHOCARDIOGRAM (TEE) (N/A) ENDARTERECTOMY CAROTID (Right)  Patient location during evaluation: SICU Anesthesia Type: General Level of consciousness: sedated Pain management: pain level controlled Vital Signs Assessment: post-procedure vital signs reviewed and stable Respiratory status: respiratory function stable and patient on ventilator - see flowsheet for VS Cardiovascular status: blood pressure returned to baseline and stable Postop Assessment: no signs of nausea or vomiting Anesthetic complications: no    Last Vitals:  Filed Vitals:   10/20/15 0541 10/20/15 1810  BP: 115/62 139/44  Pulse: 95 83  Temp: 37.3 C   Resp: 20 12    Last Pain:  Filed Vitals:   10/20/15 1821  PainSc: 0-No pain                 Reino KentJudd, Edman Lipsey J

## 2015-10-20 NOTE — Progress Notes (Signed)
  Echocardiogram 2D Echocardiogram has been performed.  Tyler SavoyCasey N Gor Le 10/20/2015, 11:26 AM

## 2015-10-20 NOTE — Transfer of Care (Signed)
Immediate Anesthesia Transfer of Care Note  Patient: Tyler DeutscherDwight E Gehl  Procedure(s) Performed: Procedure(s) with comments: REDO CORONARY ARTERY BYPASS GRAFTING (CABG), TIMES THREE, USING LEFT GREATER SAPHENOUS VEIN HARVESTED ENDOSCOPICALLY AND CRYO VEIN, WITH CORONARY ENDARTERECTOMY (N/A) - SVG to LAD, SVG to OM, CRYOVEIN to RCA with Endarterectomy TRANSESOPHAGEAL ECHOCARDIOGRAM (TEE) (N/A) ENDARTERECTOMY CAROTID (Right)  Patient Location: SICU  Anesthesia Type:General  Level of Consciousness: Patient remains intubated per anesthesia plan  Airway & Oxygen Therapy: Patient remains intubated per anesthesia plan and Patient placed on Ventilator (see vital sign flow sheet for setting)  Post-op Assessment: Report given to RN and Post -op Vital signs reviewed and stable  Post vital signs: Reviewed and stable  Last Vitals:  Filed Vitals:   10/20/15 0541 10/20/15 1810  BP: 115/62 139/44  Pulse: 95 83  Temp: 37.3 C   Resp: 20 12    Last Pain:  Filed Vitals:   10/20/15 1821  PainSc: 0-No pain      Patients Stated Pain Goal: 0 (10/19/15 1944)  Complications: No apparent anesthesia complications

## 2015-10-20 NOTE — Op Note (Signed)
OPERATIVE REPORT  Date of Surgery: 10/16/2015 - 10/20/2015  Surgeon: Josephina GipJames Lawson, MD  Assistant: Dr. Kathlee NationsPeter Van Trigt, Lianne CureMaureen Collins PA  Pre-op Diagnosis: CAD with severe bilateral internal carotid stenosis-asymptomatic  Post-op Diagnosis: CAD with severe bilateral internal carotid stenosis asymptomatic Procedure: Procedure(s): REDO CORONARY ARTERY BYPASS GRAFTING (CABG) TRANSESOPHAGEAL ECHOCARDIOGRAM (TEE) ENDARTERECTOMY CAROTID-right with Dacron patch angioplasty Anesthesia: General  EBL: 100 cc  Complications: None  Procedure Details: Patient taken the operating room placed in supine position at which time satisfactory general endotracheal anesthesia was administered. Appropriate monitoring lines been placed by anesthesia and a TEE catheter was placed in the esophagus prior to prepping and draping. The right neck chest abdomen and lower extremities were prepped Betadine scrub and solution draped in routine sterile manner. After appropriate timeout was called an incision was made in the right neck along the anterior border sternocleidomastoid mesh muscle carried into subcutaneous tissue and platysma using the Bovie. Total jugular vein was ligated with 3-0 silk ties and divided exposing the internal jugular vein. The carotid artery was difficult to locate being very deep and medial in position and the patient with a extremely large neck. He was done filed traced proximally and distally. Care was taken not injure the vagus or hypoglossal nerves both of which were exposed. Postoccipital branch of the external carotid was divided after ligating with 3-0 silk ties to assist in exposure. There was a heavily calcified plaque of carotid bifurcation which terminated about 3 cm distally about the level of the crossing the hypoglossal nerve. There did continue to be plaque proximally down the common carotid is far as I can palpate but not causing any significant stenosis. #10 shunt was prepared and the  patient was heparinized. Carotid vessels were occluded faster clamps longitudinal opening made in the common carotid with 15 blade extended up the internal carotid with Potts scissors to a point distal to the disease. The plaque was almost totally occlusive but there was excellent backbleeding from above. #10 shunt was inserted without difficulty reestablishing flow in about 2 minutes. Standard endarterectomy was then performed using the elevator the Potts scissors with an eversion endarterectomy of the external carotid. The plaque feathered off the distal internal carotid artery nicely not requiring any tacking sutures. Lumen was thoroughly irrigated with heparin saline and all loose debris carefully removed the arthrotomy was closed with Dacron patch using continuous 6-0 Prolene. Prior to completion of closure shunt was removed after about 3540 minutes of shunt time. Following antegrade and retrograde flushing closure was completed reestablishment flow does have external and up the internal branch. There is excellent Doppler flow in the internal next turned carotid arteries at the conclusion of the endarterectomy procedure. No protamine was given at this time. The wound was packed during the redo coronary artery bypass grafting procedure which Dr. Donata ClayVan Trigt  performed and he closed the neck in a subcuticular fashion at the conclusion of the procedure and inserted a 10 flat Jackson-Pratt drain.   Josephina GipJames Lawson, MD 10/20/2015 10:38 AM

## 2015-10-20 NOTE — Anesthesia Procedure Notes (Addendum)
Central Venous Catheter Insertion Performed by: anesthesiologist 10/20/2015 7:29 AM Patient location: Pre-op. Preanesthetic checklist: patient identified, IV checked, site marked, risks and benefits discussed, surgical consent, monitors and equipment checked, pre-op evaluation, timeout performed and anesthesia consent Position: Trendelenburg Lidocaine 1% used for infiltration Landmarks identified and Seldinger technique used Catheter size: 8.5 Fr Central line was placed.Sheath introducer Swan type and PA catheter depth:thermodilution and 50PA Cath depth:50 Procedure performed without using ultrasound guided technique. Attempts: 1 Following insertion, line sutured and dressing applied. Post procedure assessment: blood return through all ports, free fluid flow and no air. Patient tolerated the procedure well with no immediate complications.   Procedure Name: Intubation Date/Time: 10/20/2015 8:02 AM Performed by: Little IshikawaMERCER, Sheina Mcleish L Pre-anesthesia Checklist: Patient identified, Emergency Drugs available, Suction available, Patient being monitored and Timeout performed Patient Re-evaluated:Patient Re-evaluated prior to inductionOxygen Delivery Method: Circle system utilized Preoxygenation: Pre-oxygenation with 100% oxygen Intubation Type: IV induction Ventilation: Mask ventilation without difficulty Laryngoscope Size: Glidescope and 4 Tube type: Subglottic suction tube Tube size: 8.0 mm Number of attempts: 2 Airway Equipment and Method: Stylet and Video-laryngoscopy Placement Confirmation: ETT inserted through vocal cords under direct vision,  positive ETCO2 and breath sounds checked- equal and bilateral Secured at: 24 cm Tube secured with: Tape Dental Injury: Teeth and Oropharynx as per pre-operative assessment  Difficulty Due To: Difficult Airway- due to reduced neck mobility and Difficult Airway- due to anterior larynx Future Recommendations: Recommend- induction with short-acting agent,  and alternative techniques readily available

## 2015-10-20 NOTE — Brief Op Note (Signed)
10/16/2015 - 10/20/2015  2:58 PM  PATIENT:  Tyler Le  70 y.o. male  PRE-OPERATIVE DIAGNOSIS:  CAD  POST-OPERATIVE DIAGNOSIS:  CAD  PROCEDURE:  Procedure(s):  REDO CORONARY ARTERY BYPASS GRAFTING x 3 -SVG to LAD -SVG to OM -CRYOVEIN to RCA with Endarterectomy  ENDOSCOPIC HARVEST GREATER SAPHENOUS VEIN  -Left Leg  TRANSESOPHAGEAL ECHOCARDIOGRAM (TEE) (N/A)  ENDARTERECTOMY CAROTID (Right)  SURGEON:  Surgeon(s) and Role: Panel 1:    * Kerin PernaPeter Van Trigt, MD - Primary  Panel 2:    * Pryor OchoaJames D Lawson, MD - Primary  PHYSICIAN ASSISTANT: Lowella DandyErin Ferguson Gertner PA-C  ANESTHESIA:   general  EBL:  Total I/O In: 1750 [I.V.:1000; IV Piggyback:750] Out: 1670 [Urine:1320; Blood:350]  BLOOD ADMINISTERED: 2U PRBC, CELLSAVER, 2 FFP, 10 CRYO, and 2 PLTS  DRAINS: JP Drain in Left Leg, JP drain Right Neck, Mediastinal Chest drains   LOCAL MEDICATIONS USED:  NONE  SPECIMEN:  Source of Specimen:  Carotid Endarterectomy  DISPOSITION OF SPECIMEN:  N/A  COUNTS:  YES  TOURNIQUET:  * No tourniquets in log *  DICTATION: .Dragon Dictation  PLAN OF CARE: Admit to inpatient   PATIENT DISPOSITION:  ICU - intubated and hemodynamically stable.   Delay start of Pharmacological VTE agent (>24hrs) due to surgical blood loss or risk of bleeding: yes

## 2015-10-20 NOTE — Op Note (Signed)
NAMEMarland Kitchen  SOPHIA, CUBERO NO.:  [id number]  MEDICAL RECORD NO.:  [id number]  LOCATION:  2S10C                        FACILITY:  [facility]  PHYSICIAN:  Kerin Perna, M.D.  DATE OF BIRTH:  Jun 30, 1945  DATE OF PROCEDURE:  10/20/2015 DATE OF DISCHARGE:                              OPERATIVE REPORT   OPERATION: 1. Redo sternotomy. 2. Redo coronary bypass grafting x3 (saphenous vein graft to     circumflex marginal, saphenous vein graft to distal LAD,     cryopreserved allograft saphenous vein graft to RCA). 3. Coronary endarterectomy-RCA. 4. Placement of right femoral A-line.  SURGEON:  Kerin Perna, M.D.  ASSISTANT:  Lowella Dandy, PA-C.  PREOPERATIVE DIAGNOSES:  Unstable angina, non-ST elevation myocardial infarction, severe recurrent 3-vessel coronary disease status post multivessel CABG x5 in 2001.  POSTOPERATIVE DIAGNOSES:  Unstable angina, non-ST elevation myocardial infarction, severe recurrent 3-vessel coronary disease status post multivessel CABG x5 in 2001.  ANESTHESIA:  General.  INDICATIONS:  The patient is a 70 year old, obese, Caucasian male, diabetic who presents with unstable angina and positive cardiac enzymes. He underwent coronary bypass grafting x5 in 2001.  He did well until approximately 2015, when developed recurrent angina and had stents placed and was placed on Brilinta.  He returned with further problems last year and had a PCI.  At that time, the left IMA to the LAD was patent and the LAD looked fine.  On this current admission, he was re- cathed and the LAD had developed severe disease distal to the left IMA which was still patent.  The saphenous vein graft which had the PCI had restenosis and had 99% stenosis.  The right coronary was totally occluded as was the vein graft to the right coronary.  Overall LV function, however, was still fairly well preserved.  The patient was felt to be a candidate for redo CABG, although at high  risk because of his lung disease and obesity.  His preoperative evaluation included carotid duplex scans which showed severe bilateral carotid stenoses of approximately 90%.  For that reason, he underwent assessment for carotid endarterectomy and that was completed by Dr. Hart Rochester in a separate note prior to the coronary bypass surgery.  Prior to the operation, I discussed redo surgery with the patient and family including the indications, benefits, alternatives and risks.  I discussed the plan to use saphenous vein harvested endoscopically from his left leg.  I discussed the risks of the surgery including the risks of stroke, MI, bleeding, blood transfusion requirement, postoperative pulmonary problems including prolonged ventilator dependence and possibly tracheostomy, postoperative pleural effusions, postoperative infection, and death.  After reviewing these issues, he demonstrates his understanding and agrees to proceed with surgery under what I felt was an informed consent.  FINDINGS: 1. Adequate conduit, but not enough length so that the vein graft to     the right coronary which required endarterectomy was a saphenous     vein, cryopreserved vein. 2. Preserved LV systolic function after separation from     cardiopulmonary bypass. 3. Successful right carotid endarterectomy completed by Dr. Raquel James. Hart Rochester     which will be dictated in a separate note.  OPERATIVE  PROCEDURE:  The patient had been prepared for surgery and was prepped and draped and had undergone right carotid endarterectomy.  The saphenous vein had been harvested from the right leg endoscopically.  A proper time-out was performed.  A femoral A-line was placed in the right groin and connected to the transducer for blood pressure monitoring.  A sternal incision was then made using a redo saw with care being taken to avoid injuring the underlying structures.  The patient's obese body habitus and large heart made  dissection difficult.  The heart was adherent to the undersurface of the sternum.  This was carefully dissected off without major blood loss.  Rultract sternal elevating retractor was then used to help dissect the left side of the heart and identified the mammary artery pedicle which was encircled with a vessel loop.  The sternal retractor was then placed using the deep blades and further anterior dissection of the mediastinum including the right ventricle, the right atrium, the ascending aorta, the previously placed vein grafts and careful dissection of the innominate vein off the previous cannulation site.  Heparin was administered.  An aortic pursestring was placed distal to the previous cannulation site.  A pursestring was placed in the right atrium.  When the ACT was documented as being therapeutic, the aorta above the previous cannulation site was cannulated with a 24-[place] cannula tip.  The right atrium was cannulated with a dual- stage cannula, and the patient was placed on cardiopulmonary bypass. Further dissection was then completed, however, not complete posterior dissection which was left for the period of crossclamp.  Cardioplegia cannulas were placed for antegrade and retrograde cold blood cardioplegia and the dissection between the aorta and pulmonary artery was completed to make room for a crossclamp.  The patient was cooled to 32 degrees, and the aortic crossclamp was applied and 1.5 L of cold blood cardioplegia was delivered to the coronaries in split doses between the antegrade aortic and retrograde coronary sinus catheters. There was good cardioplegic arrest, and septal temperature dropped less than 14 degrees.  Cardioplegia was delivered every 20 minutes.  After complete asystolic arrest, the remainder of the posterior dissection was completed.  I was able to identify the vein graft which was the sequential graft to the ramus and then the distal  circumflex. The heart was difficult to expose due to his body habitus, however, the vein graft was used to identify the point for the circumflex anastomosis which was placed just distal to the old anastomosis.  An arteriotomy was made in the circumflex, it was a 1.5-mm vessel.  A 1.5 mm probe passed distally easily.  We then placed a new vein graft end-to-side with running 7-0 Prolene and this had good flow.  This was then brought back underneath the mammary pedicle and placed for the proximal anastomosis later time.  Next, the distal LAD was identified at the very apex.  It was heavily calcified beyond the previously placed mammary artery anastomosis.  The LAD was opened.  It was 1.5-mm vessel.  A probe passed distally past the apex.  A reverse saphenous vein was then sewn end-to-side with running 7- 0 Prolene.  There was good flow through the graft.  Cardioplegia was redosed.  The RCA was then dissected out in the AV groove.  It was calcified.  The distal right vessels were too small to graft.  After making arteriotomy, it was clear that the vessel was with a large plaque burden.  For that reason, an  endarterectomy was then performed of the right coronary. After endarterectomized, plaque was removed, the vessel had approximately a 1.7 mm lumen.  A reverse saphenous vein was then sewn end-to-side with running 7-0 Prolene, and there was good flow through the graft.  Cardioplegia was redosed.  With the crossclamp still in place 2 proximal vein anastomoses were performed on the ascending aorta using a 4.5 mm punch running 6-0 Prolene.  The 3rd proximal anastomosis was placed later with the LAD graft to the hood of the circumflex vein graft.  After the final proximal anastomosis was performed, air was vented from the coronaries with a dose of retrograde warm blood cardioplegia, and the crossclamp was removed.  The heart resumed a spontaneous rhythm.  The vein grafts were  de-aired and opened and checked for hemostasis at the proximal and distal anastomoses.  The vein-to-vein anastomosis between the LAD and circumflex vein was then created using running 7-0 Prolene.  This resulted in good revascularization of all 3 major distributions of the LAD, circumflex, and right coronary.  The patient was then rewarmed and reperfused.  The lungs were expanded and ventilator was resumed.  When the patient was adequately reperfused and rewarmed, he was weaned off cardiopulmonary bypass on low-dose inotropes including low-dose dopamine and Milrinone.  Echo showed preserved LV systolic function.  Cardiac output was normal. Hemodynamics were satisfactory.  The patient received protamine.  There was no adverse reaction.  However, the protamine did not result in any significant improvement in hemostasis, so the patient was transfused platelets for platelet count of 60,000 and FFP and cryo.  This improved coagulation function.  Anterior mediastinal and posterior mediastinal chest tubes were placed and brought out through separate incisions.  The sternal wires were placed with great difficulty because of the patient's size and the calcification of the sternum.  The chest was closed, and the patient remained stable.  The pectoralis fascia was closed with a running #1 Vicryl.  The subcutaneous and skin layers were closed in running Vicryl.  The right neck incision was opened and irrigated.  A drain was placed. The neck was closed in layers using Vicryl, and skin staples for the skin.  Sterile dressings were then applied, and the patient returned to the ICU in critical, but stable condition.  Total cardiopulmonary bypass time was 160 minutes.     Kerin PernaPeter Van Trigt, M.D.     PV/MEDQ  D:  10/20/2015  T:  10/20/2015  Job:  [id number]  cc:   Corky CraftsJayadeep S Varanasi, MD

## 2015-10-20 NOTE — Progress Notes (Signed)
      [address]       [place] [id number]             [phone]      Just back from OR  Intubated sedated  BP 139/44 mmHg  Pulse 83  Temp(Src) 99.1 F (37.3 C) (Oral)  Resp 12  Ht 6\' 4"  (1.93 m)  Wt 292 lb 14.4 oz (132.859 kg)  BMI 35.67 kg/m2  SpO2 100%   Intake/Output Summary (Last 24 hours) at 10/20/15 1821 Last data filed at 10/20/15 1711  Gross per 24 hour  Intake 7016.4 ml  Output   4020 ml  Net 2996.4 ml   Minimal CT output  Labs pending  Viviann SpareSteven C. Dorris FetchHendrickson, MD [facility] [phone]

## 2015-10-20 NOTE — Transfer of Care (Signed)
Immediate Anesthesia Transfer of Care Note  Patient: Tyler Le  Procedure(s) Performed: Procedure(s) with comments: REDO CORONARY ARTERY BYPASS GRAFTING (CABG), TIMES THREE, USING LEFT GREATER SAPHENOUS VEIN HARVESTED ENDOSCOPICALLY AND CRYO VEIN, WITH CORONARY ENDARTERECTOMY (N/A) - SVG to LAD, SVG to OM, CRYOVEIN to RCA with Endarterectomy TRANSESOPHAGEAL ECHOCARDIOGRAM (TEE) (N/A) ENDARTERECTOMY CAROTID (Right)  Patient Location: SICU  Anesthesia Type:General  Level of Consciousness: sedated  Airway & Oxygen Therapy: Patient remains intubated per anesthesia plan  Post-op Assessment: Report given to RN and Post -op Vital signs reviewed and stable  Post vital signs: stable  Last Vitals:  Filed Vitals:   10/20/15 0541 10/20/15 1810  BP: 115/62 139/44  Pulse: 95 83  Temp: 37.3 C   Resp: 20 12    Last Pain:  Filed Vitals:   10/20/15 1821  PainSc: 0-No pain      Patients Stated Pain Goal: 0 (10/19/15 1944)  Complications: No apparent anesthesia complications

## 2015-10-20 NOTE — Progress Notes (Signed)
The patient was examined and preop studies reviewed. There has been no change from the prior exam and the patient is ready for surgery.   Plan redo CABG on D Tyler Le

## 2015-10-20 NOTE — Progress Notes (Signed)
Pt. wore BiPAP X 5 hours at 10 IPAP/ 5 EPAP with 2 lpm, from 2200 hrs. on 10/19/2015 to 0322 hrs. on 10/20/2015 with large full face mask,  tolerated well, ABG drawn after X30 minutes on room air.

## 2015-10-21 ENCOUNTER — Inpatient Hospital Stay (HOSPITAL_COMMUNITY): Payer: Medicare Other

## 2015-10-21 DIAGNOSIS — J9612 Chronic respiratory failure with hypercapnia: Secondary | ICD-10-CM

## 2015-10-21 LAB — POCT I-STAT 7, (LYTES, BLD GAS, ICA,H+H)
BICARBONATE: 26.5 meq/L — AB (ref 20.0–24.0)
Bicarbonate: 26.1 mEq/L — ABNORMAL HIGH (ref 20.0–24.0)
CALCIUM ION: 1.15 mmol/L (ref 1.13–1.30)
Calcium, Ion: 1.17 mmol/L (ref 1.13–1.30)
HCT: 31 % — ABNORMAL LOW (ref 39.0–52.0)
HEMATOCRIT: 28 % — AB (ref 39.0–52.0)
HEMOGLOBIN: 9.5 g/dL — AB (ref 13.0–17.0)
Hemoglobin: 10.5 g/dL — ABNORMAL LOW (ref 13.0–17.0)
O2 SAT: 100 %
O2 Saturation: 98 %
PCO2 ART: 48.7 mmHg — AB (ref 35.0–45.0)
PCO2 ART: 44.6 mmHg (ref 35.0–45.0)
PH ART: 7.339 — AB (ref 7.350–7.450)
PO2 ART: 106 mmHg — AB (ref 80.0–100.0)
POTASSIUM: 4.1 mmol/L (ref 3.5–5.1)
Patient temperature: 36.1
Potassium: 4 mmol/L (ref 3.5–5.1)
SODIUM: 135 mmol/L (ref 135–145)
Sodium: 136 mmol/L (ref 135–145)
TCO2: 28 mmol/L (ref 0–100)
TCO2: 27 mmol/L (ref 0–100)
pH, Arterial: 7.369 (ref 7.350–7.450)
pO2, Arterial: 257 mmHg — ABNORMAL HIGH (ref 80.0–100.0)

## 2015-10-21 LAB — CBC
HEMATOCRIT: 25 % — AB (ref 39.0–52.0)
HEMATOCRIT: 23.2 % — AB (ref 39.0–52.0)
HEMOGLOBIN: 8.6 g/dL — AB (ref 13.0–17.0)
HEMOGLOBIN: 8 g/dL — AB (ref 13.0–17.0)
MCH: 30.1 pg (ref 26.0–34.0)
MCH: 30.8 pg (ref 26.0–34.0)
MCHC: 34.4 g/dL (ref 30.0–36.0)
MCHC: 34.5 g/dL (ref 30.0–36.0)
MCV: 87.4 fL (ref 78.0–100.0)
MCV: 89.2 fL (ref 78.0–100.0)
Platelets: 117 10*3/uL — ABNORMAL LOW (ref 150–400)
Platelets: 101 10*3/uL — ABNORMAL LOW (ref 150–400)
RBC: 2.86 MIL/uL — ABNORMAL LOW (ref 4.22–5.81)
RBC: 2.6 MIL/uL — ABNORMAL LOW (ref 4.22–5.81)
RDW: 13.7 % (ref 11.5–15.5)
RDW: 14 % (ref 11.5–15.5)
WBC: 8.9 10*3/uL (ref 4.0–10.5)
WBC: 9.7 10*3/uL (ref 4.0–10.5)

## 2015-10-21 LAB — PREPARE FRESH FROZEN PLASMA
Unit division: 0
Unit division: 0

## 2015-10-21 LAB — POCT I-STAT 3, ART BLOOD GAS (G3+)
Bicarbonate: 23.5 mEq/L (ref 20.0–24.0)
O2 SAT: 95 %
PCO2 ART: 34.7 mmHg — AB (ref 35.0–45.0)
PH ART: 7.443 (ref 7.350–7.450)
Patient temperature: 38.3
TCO2: 24 mmol/L (ref 0–100)
pO2, Arterial: 76 mmHg — ABNORMAL LOW (ref 80.0–100.0)

## 2015-10-21 LAB — GLUCOSE, CAPILLARY
GLUCOSE-CAPILLARY: 125 mg/dL — AB (ref 65–99)
GLUCOSE-CAPILLARY: 132 mg/dL — AB (ref 65–99)
GLUCOSE-CAPILLARY: 135 mg/dL — AB (ref 65–99)
GLUCOSE-CAPILLARY: 141 mg/dL — AB (ref 65–99)
GLUCOSE-CAPILLARY: 141 mg/dL — AB (ref 65–99)
GLUCOSE-CAPILLARY: 142 mg/dL — AB (ref 65–99)
GLUCOSE-CAPILLARY: 143 mg/dL — AB (ref 65–99)
GLUCOSE-CAPILLARY: 149 mg/dL — AB (ref 65–99)
GLUCOSE-CAPILLARY: 154 mg/dL — AB (ref 65–99)
GLUCOSE-CAPILLARY: 155 mg/dL — AB (ref 65–99)
GLUCOSE-CAPILLARY: 164 mg/dL — AB (ref 65–99)
GLUCOSE-CAPILLARY: 165 mg/dL — AB (ref 65–99)
GLUCOSE-CAPILLARY: 174 mg/dL — AB (ref 65–99)
Glucose-Capillary: 178 mg/dL — ABNORMAL HIGH (ref 65–99)
Glucose-Capillary: 134 mg/dL — ABNORMAL HIGH (ref 65–99)
Glucose-Capillary: 119 mg/dL — ABNORMAL HIGH (ref 65–99)
Glucose-Capillary: 115 mg/dL — ABNORMAL HIGH (ref 65–99)
Glucose-Capillary: 141 mg/dL — ABNORMAL HIGH (ref 65–99)
Glucose-Capillary: 153 mg/dL — ABNORMAL HIGH (ref 65–99)
Glucose-Capillary: 154 mg/dL — ABNORMAL HIGH (ref 65–99)
Glucose-Capillary: 150 mg/dL — ABNORMAL HIGH (ref 65–99)
Glucose-Capillary: 168 mg/dL — ABNORMAL HIGH (ref 65–99)
Glucose-Capillary: 165 mg/dL — ABNORMAL HIGH (ref 65–99)
Glucose-Capillary: 157 mg/dL — ABNORMAL HIGH (ref 65–99)

## 2015-10-21 LAB — PREPARE PLATELET PHERESIS
Unit division: 0
Unit division: 0

## 2015-10-21 LAB — CREATININE, SERUM
Creatinine, Ser: 1.53 mg/dL — ABNORMAL HIGH (ref 0.61–1.24)
GFR calc Af Amer: 51 mL/min — ABNORMAL LOW (ref >60)
GFR, EST NON AFRICAN AMERICAN: 44 mL/min — AB (ref >60)

## 2015-10-21 LAB — PREPARE CRYOPRECIPITATE
UNIT DIVISION: 0
Unit division: 0

## 2015-10-21 LAB — COMPREHENSIVE METABOLIC PANEL
ALT: 22 U/L (ref 17–63)
AST: 42 U/L — ABNORMAL HIGH (ref 15–41)
Albumin: 2.9 g/dL — ABNORMAL LOW (ref 3.5–5.0)
Alkaline Phosphatase: 31 U/L — ABNORMAL LOW (ref 38–126)
Anion gap: 6 (ref 5–15)
BUN: 12 mg/dL (ref 6–20)
CO2: 25 mmol/L (ref 22–32)
Calcium: 8.3 mg/dL — ABNORMAL LOW (ref 8.9–10.3)
Chloride: 108 mmol/L (ref 101–111)
Creatinine, Ser: 1.42 mg/dL — ABNORMAL HIGH (ref 0.61–1.24)
GFR calc Af Amer: 56 mL/min — ABNORMAL LOW (ref >60)
GFR calc non Af Amer: 48 mL/min — ABNORMAL LOW (ref >60)
Glucose, Bld: 115 mg/dL — ABNORMAL HIGH (ref 65–99)
Potassium: 4.1 mmol/L (ref 3.5–5.1)
Sodium: 139 mmol/L (ref 135–145)
Total Bilirubin: 0.6 mg/dL (ref 0.3–1.2)
Total Protein: 5 g/dL — ABNORMAL LOW (ref 6.5–8.1)

## 2015-10-21 LAB — MAGNESIUM
MAGNESIUM: 2.1 mg/dL (ref 1.7–2.4)
Magnesium: 2.3 mg/dL (ref 1.7–2.4)

## 2015-10-21 LAB — POCT I-STAT, CHEM 8
BUN: 16 mg/dL (ref 6–20)
CALCIUM ION: 1.16 mmol/L (ref 1.13–1.30)
Chloride: 101 mmol/L (ref 101–111)
Creatinine, Ser: 1.4 mg/dL — ABNORMAL HIGH (ref 0.61–1.24)
GLUCOSE: 147 mg/dL — AB (ref 65–99)
HCT: 23 % — ABNORMAL LOW (ref 39.0–52.0)
HEMOGLOBIN: 7.8 g/dL — AB (ref 13.0–17.0)
Potassium: 4 mmol/L (ref 3.5–5.1)
SODIUM: 139 mmol/L (ref 135–145)
TCO2: 24 mmol/L (ref 0–100)

## 2015-10-21 MED ORDER — FENTANYL CITRATE (PF) 100 MCG/2ML IJ SOLN: 50.0000 ug | INTRAMUSCULAR | Status: DC | PRN

## 2015-10-21 MED ORDER — MIDAZOLAM HCL 2 MG/2ML IJ SOLN
0.5000 mg | INTRAMUSCULAR | Status: DC | PRN
  Filled 2015-10-21 (×2): qty 2

## 2015-10-21 MED ORDER — FUROSEMIDE 10 MG/ML IJ SOLN
40.0000 mg | Freq: Once | INTRAMUSCULAR | Status: AC
  Administered 2015-10-21: 40 mg via INTRAVENOUS

## 2015-10-21 MED ORDER — FENTANYL CITRATE (PF) 100 MCG/2ML IJ SOLN
50.0000 ug | INTRAMUSCULAR | Status: DC | PRN
  Administered 2015-10-21 – 2015-10-26 (×6): 100 ug via INTRAVENOUS
  Filled 2015-10-21 (×3): qty 2

## 2015-10-21 MED ORDER — FENTANYL CITRATE (PF) 100 MCG/2ML IJ SOLN
50.0000 ug | INTRAMUSCULAR | Status: AC | PRN
  Administered 2015-10-21 – 2015-10-25 (×3): 50 ug via INTRAVENOUS
  Filled 2015-10-21 (×2): qty 2

## 2015-10-21 MED ORDER — SODIUM CHLORIDE 0.9 % IV SOLN
25.0000 ug/h | INTRAVENOUS | Status: DC
  Administered 2015-10-21: 50 ug/h via INTRAVENOUS
  Administered 2015-10-22 – 2015-10-23 (×4): 400 ug/h via INTRAVENOUS
  Administered 2015-10-24: 300 ug/h via INTRAVENOUS
  Administered 2015-10-24: 400 ug/h via INTRAVENOUS
  Administered 2015-10-24: 300 ug/h via INTRAVENOUS
  Administered 2015-10-25: 25 ug/h via INTRAVENOUS
  Administered 2015-10-25: 50 ug/h via INTRAVENOUS
  Administered 2015-10-25: 25 ug/h via INTRAVENOUS
  Administered 2015-10-26: 50 ug/h via INTRAVENOUS
  Administered 2015-10-27: 200 ug/h via INTRAVENOUS
  Administered 2015-10-28 (×2): 250 ug/h via INTRAVENOUS
  Filled 2015-10-21 (×20): qty 50

## 2015-10-21 MED ORDER — MIDAZOLAM HCL 2 MG/2ML IJ SOLN
2.0000 mg | INTRAMUSCULAR | Status: DC | PRN
  Administered 2015-10-21 – 2015-11-01 (×46): 2 mg via INTRAVENOUS
  Filled 2015-10-21 (×35): qty 2

## 2015-10-21 MED ORDER — FENTANYL CITRATE (PF) 100 MCG/2ML IJ SOLN
50.0000 ug | Freq: Once | INTRAMUSCULAR | Status: AC
Start: 2015-10-21 — End: 2015-10-21
  Administered 2015-10-21: 50 ug via INTRAVENOUS

## 2015-10-21 MED ORDER — POTASSIUM CHLORIDE 10 MEQ/50ML IV SOLN
10.0000 meq | INTRAVENOUS | Status: AC
  Administered 2015-10-21 (×2): 10 meq via INTRAVENOUS

## 2015-10-21 MED ORDER — MIDAZOLAM HCL 2 MG/2ML IJ SOLN
2.0000 mg | Freq: Once | INTRAMUSCULAR | Status: AC
  Administered 2015-10-21: 2 mg via INTRAVENOUS

## 2015-10-21 MED ORDER — FENTANYL BOLUS VIA INFUSION
50.0000 ug | INTRAVENOUS | Status: DC | PRN
  Administered 2015-10-23 – 2015-10-25 (×9): 50 ug via INTRAVENOUS
  Filled 2015-10-21: qty 50

## 2015-10-21 NOTE — Progress Notes (Signed)
   Daily Progress Note  Assessment/Planning: POD #1 s/p R CEA/CABG   Sedate so not able to get an exam yet  No hematoma in R neck  Keep drain for one more day  Subjective  - 1 Day Post-Op  Sedated and intubated  Objective Filed Vitals:   10/21/15 0825 10/21/15 0828 10/21/15 0900 10/21/15 1000  BP:   108/56 125/62  Pulse:   93 93  Temp:   100.4 F (38 C) 100.2 F (37.9 C)  TempSrc:      Resp:   16 16  Height:      Weight:      SpO2: 99% 98% 95% 95%    Intake/Output Summary (Last 24 hours) at 10/21/15 1058 Last data filed at 10/21/15 0900  Gross per 24 hour  Intake 7751.76 ml  Output   4155 ml  Net 3596.76 ml    NECK no significant hematoma, JP with serosang drainage 75 cc since OR NEURO open eyes but not following instructions yet  Laboratory CBC    Component Value Date/Time   WBC 8.9 10/21/2015 0436   HGB 8.6* 10/21/2015 0436   HCT 25.0* 10/21/2015 0436   PLT 117* 10/21/2015 0436    BMET    Component Value Date/Time   NA 139 10/21/2015 0432   K 4.1 10/21/2015 0432   CL 108 10/21/2015 0432   CO2 25 10/21/2015 0432   GLUCOSE 115* 10/21/2015 0432   BUN 12 10/21/2015 0432   CREATININE 1.42* 10/21/2015 0432   CREATININE 1.40* 10/11/2015 1637   CALCIUM 8.3* 10/21/2015 0432   GFRNONAA 48* 10/21/2015 0432   GFRAA 56* 10/21/2015 0432    Leonides SakeBrian Chen, MD [facility] [place] Office: [phone] Pager: [phone]  10/21/2015, 10:58 AM

## 2015-10-21 NOTE — Progress Notes (Signed)
Pt gets severly agitated and tries to get out and/or lifts his body out of bed. Pt is unable to be redirected and his requiring significant amount of prn meds to achieve desire RASS, but barely making it to the next dose. In pt's agitated state, swan pulled from 52cm to approx 46cm. Per pain and agitation protocol for mechanically ventilated pt, advanced pt to phase 2 of protocol to fentanyl drip. Will continue to monitor

## 2015-10-21 NOTE — Progress Notes (Signed)
Name: Tyler Le MRN: [id number] DOB: [birth year]    ADMISSION DATE:  10/16/2015 CONSULTATION DATE:  10/18/15  REFERRING MD : Donata ClayVan Trigt, MD  CHIEF COMPLAINT:  Hypercapnia  BRIEF PATIENT DESCRIPTION: 70 yr old obese, needs redo CABG, called for Acute on chronic resp acidosis   SUBJECTIVE:  CABG redo and CEA yesterday  VITAL SIGNS: Temp:  [96.8 F (36 C)-101.1 F (38.4 C)] 100.2 F (37.9 C) (04/29 1000) Pulse Rate:  [83-96] 93 (04/29 1000) Resp:  [12-16] 16 (04/29 1000) BP: (93-139)/(44-62) 125/62 mmHg (04/29 1000) SpO2:  [95 %-100 %] 95 % (04/29 1000) Arterial Line BP: (100-157)/(47-61) 142/59 mmHg (04/29 1000) FiO2 (%):  [50 %-100 %] 50 % (04/29 1000) Weight:  [140.2 kg (309 lb 1.4 oz)] 140.2 kg (309 lb 1.4 oz) (04/29 0150)  PHYSICAL EXAMINATION: General:  Sleepy on vent HENT: NCAT ETT in place PULM: CTA B vents supported breaths CV: midline scar dressing in place, mediastinal and chest tubes in place GI: BS+, soft Derm: no rash Neuro: opens eyes, doesn't follow commands   Recent Labs Lab 10/18/15 0535 10/20/15 0542  10/20/15 1449 10/20/15 1601 10/20/15 1828 10/21/15 0432  NA 134* 136  < > 134* 136 137 139  K 4.5 3.9  < > 4.3 4.1 3.9 4.1  CL 100* 101  < > 98* 98*  --  108  CO2 24 25  --   --   --   --  25  BUN 14 15  < > 13 13  --  12  CREATININE 1.39* 1.39*  < > 0.90 0.90  --  1.42*  GLUCOSE 140* 130*  < > 139* 189* 165* 115*  < > = values in this interval not displayed.  Recent Labs Lab 10/20/15 0542  10/20/15 1445  10/20/15 1810 10/20/15 1828 10/21/15 0436  HGB 11.2*  < > 8.8*  < > 9.1* 10.2* 8.6*  HCT 33.9*  < > 26.0*  < > 26.7* 30.0* 25.0*  WBC 4.7  --   --   --  6.2  --  8.9  PLT 109*  --  63*  --  95*  --  117*  < > = values in this interval not displayed. Dg Chest Port 1 View  10/21/2015  CLINICAL DATA:  Patient status post CABG procedure. EXAM: PORTABLE CHEST 1 VIEW COMPARISON:  Chest radiograph 10/20/2015. FINDINGS: ET tube terminates  in the mid trachea. Enteric tube projects over the stomach. Pulmonary arterial catheter tip projects over the main pulmonary artery. Patient is rotated to the left. Stable enlarged cardiac and mediastinal contours. Unchanged small left pleural effusion with underlying pulmonary consolidation. Tiny right pleural effusion. No pneumothorax. IMPRESSION: Stable support apparatus. Unchanged left lower lung consolidation and small left pleural effusion. Tiny right pleural effusion. Electronically Signed   By: Annia Beltrew  Davis M.D.   On: 10/21/2015 07:28   Dg Chest Portable 1 View  10/20/2015  CLINICAL DATA:  S/p cabg x 3 done in OR EXAM: PORTABLE CHEST 1 VIEW COMPARISON:  10/19/2015 FINDINGS: Cardiac silhouette enlargement similar to prior study. Swan-Ganz since venous catheter tip projects over the main pulmonary artery. Endotracheal tube tip is about 5 cm above the carina. NG tube crosses the gastroesophageal junction. Mild hypoventilatory change right lung. More extensive left lower lobe consolidation. IMPRESSION: Postoperative left lower lobe consolidation. Lines and tubes as described. Electronically Signed   By: Esperanza Heiraymond  Rubner M.D.   On: 10/20/2015 18:00    STUDIES:  PFT 4/25- restriction  lung dz, low DLCO diffusion, r/o mixed  CULTURES:   ANTIBIOTICS: Cefuroxime 4/28 >   SIGNIFICANT EVENTS: 4/28 CABG and CEA  LINES/TUBES: 4/28 midine R arm >  4/28 L subclavian introducer >  4/28 left leg drain >  4/28 R neck drain >  4/28 mediastinal and bilat chest tubes >    ASSESSMENT / PLAN:   DISCUSSION: 70 y/o male with obesity hypoventilation syndrome who underwent a redo CABG and CEA on 4/28.  ASSESSMENT / PLAN:  PULMONARY A: Vent dependence Chronic respiratory failure with hypercapnea Obesity hypoventilation syndrome P:   Continue full vent support until mental status improves Change to PRVC Will need nocturnal BIPAP post extubation  CARDIOVASCULAR A:  S/p CABG and CEA P:  Per  vascular and TCTS  RENAL A:   AKI P:   Continue vasopressors and fluids as ordered Monitor BMET and UOP Replace electrolytes as needed  GASTROINTESTINAL A:   No acute issues P:   OG tube PPI Tube feedings if not extubated  HEMATOLOGIC A:   Anemia, no bleeding P:  Transfusion per usual ICU guideliens  INFECTIOUS A:   Fever post op, normal WBC so likely atelectasis P:   Monitor  Continue cefuroxime per surgery  ENDOCRINE A:   Hyperglycemia P:   Continue insulin gtt  NEUROLOGIC A:   Acute encephalopathy> favor sedation/anesthesia related given long surgery P:   RASS goal: -1 PAD protocol with precedex   FAMILY  - Updates: updated bedside 4/29  My cc time 33 minutes  Heber Chauncey, MD [place] [facility] Pager: [phone] Cell: [phone] After 3pm or if no response, call [phone]  10/21/2015, 10:39 AM

## 2015-10-21 NOTE — Progress Notes (Signed)
1 Day Post-Op Procedure(s) (LRB): REDO CORONARY ARTERY BYPASS GRAFTING (CABG), TIMES THREE, USING LEFT GREATER SAPHENOUS VEIN HARVESTED ENDOSCOPICALLY AND CRYO VEIN, WITH CORONARY ENDARTERECTOMY (N/A) TRANSESOPHAGEAL ECHOCARDIOGRAM (TEE) (N/A) ENDARTERECTOMY CAROTID (Right) Subjective: Intubated. Opens eyes to voice Spontaneous movement, not following commands at this point  Objective: Vital signs in last 24 hours: Temp:  [96.8 F (36 C)-101.1 F (38.4 C)] 100.4 F (38 C) (04/29 0900) Pulse Rate:  [83-96] 93 (04/29 0900) Cardiac Rhythm:  [-] Normal sinus rhythm;Bundle branch block (04/29 0800) Resp:  [12-16] 16 (04/29 0900) BP: (93-139)/(44-62) 108/56 mmHg (04/29 0900) SpO2:  [95 %-100 %] 95 % (04/29 0900) Arterial Line BP: (100-157)/(47-61) 126/50 mmHg (04/29 0900) FiO2 (%):  [50 %-100 %] 50 % (04/29 0828) Weight:  [309 lb 1.4 oz (140.2 kg)] 309 lb 1.4 oz (140.2 kg) (04/29 0150)  Hemodynamic parameters for last 24 hours: PAP: (25-37)/(15-28) 27/17 mmHg CO:  [4.8 L/min-6.5 L/min] 5.7 L/min CI:  [1.9 L/min/m2-2.5 L/min/m2] 2.2 L/min/m2  Intake/Output from previous day: 04/28 0701 - 04/29 0700 In: 8255.4 [I.V.:5427.4; Blood:1108; NG/GT:120; IV Piggyback:1600] Out: 4830 [Urine:2455; Emesis/NG output:200; Drains:75; Blood:1850; Chest Tube:250] Intake/Output this shift: Total I/O In: 496.3 [I.V.:216.3; NG/GT:30; IV Piggyback:250] Out: 135 [Urine:65; Chest Tube:70]  General appearance: intubated Neurologic: opens eyes moves all 4 spontaneously, not following commands Heart: regular rate and rhythm Lungs: clear anteriorly  Lab Results:  Recent Labs  10/20/15 1810 10/20/15 1828 10/21/15 0436  WBC 6.2  --  8.9  HGB 9.1* 10.2* 8.6*  HCT 26.7* 30.0* 25.0*  PLT 95*  --  117*   BMET:  Recent Labs  10/20/15 0542  10/20/15 1601 10/20/15 1828 10/21/15 0432  NA 136  < > 136 137 139  K 3.9  < > 4.1 3.9 4.1  CL 101  < > 98*  --  108  CO2 25  --   --   --  25  GLUCOSE  130*  < > 189* 165* 115*  BUN 15  < > 13  --  12  CREATININE 1.39*  < > 0.90  --  1.42*  CALCIUM 8.5*  --   --   --  8.3*  < > = values in this interval not displayed.  PT/INR:  Recent Labs  10/20/15 1810  LABPROT 16.8*  INR 1.35   ABG    Component Value Date/Time   PHART 7.443 10/21/2015 0420   HCO3 23.5 10/21/2015 0420   TCO2 24 10/21/2015 0420   ACIDBASEDEF 1.0 10/20/2015 1824   O2SAT 95.0 10/21/2015 0420   CBG (last 3)   Recent Labs  10/21/15 0013 10/21/15 0107 10/21/15 0211  GLUCAP 165* 155* 134*    Assessment/Plan: S/P Procedure(s) (LRB): REDO CORONARY ARTERY BYPASS GRAFTING (CABG), TIMES THREE, USING LEFT GREATER SAPHENOUS VEIN HARVESTED ENDOSCOPICALLY AND CRYO VEIN, WITH CORONARY ENDARTERECTOMY (N/A) TRANSESOPHAGEAL ECHOCARDIOGRAM (TEE) (N/A) ENDARTERECTOMY CAROTID (Right)   NEURO- as noted he moves spontaneously and opens eyes but not yet following commands  No focal deficit so I think benefit of scanning at this point is limited and outweighed by risk of transport. Will try to decrease sedation  -CV- s/p redo CABG  Good cardiac output on milrinone, epi, dopamine  RESP- ABG Ok. CXR with some basilar atelectasis  Will not attempt to wean until neuro status improves  RENAL- creatinine 1.42- follow, lytes OK  Will try to diurese  ENDO- CBG controlled with insulin drip  SCD for DVT prophylaxis     LOS: 5 days  Loreli SlotSteven C Jadakiss Barish 10/21/2015

## 2015-10-21 NOTE — Progress Notes (Signed)
      [address]       [place] [id number]             [phone]      Still gets agitated when sedation lightened  BP 126/51 mmHg  Pulse 89  Temp(Src) 98.8 F (37.1 C) (Core (Comment))  Resp 14  Ht 6\' 4"  (1.93 m)  Wt 309 lb 1.4 oz (140.2 kg)  BMI 37.64 kg/m2  SpO2 93%  Cardiac index is 2.5 on dopamine @ 5 and milrinone @ 0.25  Intake/Output Summary (Last 24 hours) at 10/21/15 1758 Last data filed at 10/21/15 1601  Gross per 24 hour  Intake 3967.91 ml  Output   3190 ml  Net 777.91 ml   Will keep intubated until mental status improves  Viviann SpareSteven C. Dorris FetchHendrickson, MD [facility] [phone]

## 2015-10-21 NOTE — Progress Notes (Signed)
Initial Nutrition Assessment  DOCUMENTATION CODES:   Obesity unspecified  INTERVENTION:   If pt unable to extubate within 48 hours to intubation, recommend:   Initiate Vital High Protein @ 20 ml/hr via OGT and increase by 10 ml every 4 hours to goal rate of 90 ml/hr.   Tube feeding regimen provides 2250 kcal (100% of needs), 189 grams of protein, and 1806 ml of H2O.   NUTRITION DIAGNOSIS:   Inadequate oral intake related to inability to eat as evidenced by NPO status.  GOAL:   Provide needs based on ASPEN/SCCM guidelines  MONITOR:   Vent status, Labs, Weight trends, Skin, I & O's  REASON FOR ASSESSMENT:   Ventilator    ASSESSMENT:   Tyler Le is a 70 y.o. male, who presents for evaluation of bilateral internal carotid artery stenosis. He is scheduled for a redo CABG on Friday with Dr. Donata ClayVan Trigt for severe recurrent three vessel CAD. He previously had a CABG in 2001. He underwent cardiac cath on 10/16/15 due to unstable angina and was admitted afterwards.   Pt admitted with USA/accelerated angina and CAD. S/p cath 10/16/15.   S/p Procedure(s) 10/20/15: REDO CORONARY ARTERY BYPASS GRAFTING (CABG) TRANSESOPHAGEAL ECHOCARDIOGRAM (TEE) ENDARTERECTOMY CAROTID-right with Dacron patch angioplasty   Patient is currently intubated on ventilator support. OGT in place.  MV: 12.1 L/min Temp (24hrs), Avg:99.9 F (37.7 C), Min:96.8 F (36 C), Max:101.1 F (38.4 C)  Propofol: n/a  Nutrition-Focused physical exam completed. Findings are no fat depletion, no muscle depletion, and mild edema.   Case discussed with RN. No plans to start nutrition today. Likely extubate tomorrow.   Labs reviewed.  Diet Order:  Diet NPO time specified  Skin:  Reviewed, no issues  Last BM:  10/18/15  Height:   Ht Readings from Last 1 Encounters:  10/16/15 6\' 4"  (1.93 m)    Weight:   Wt Readings from Last 1 Encounters:  10/21/15 309 lb 1.4 oz (140.2 kg)    Ideal Body Weight:  91.8  kg  BMI:  Body mass index is 37.64 kg/(m^2).  Estimated Nutritional Needs:   Kcal:  2020-2295  Protein:  >183 grams  Fluid:  >2.0 L  EDUCATION NEEDS:   No education needs identified at this time  Emalynn Clewis A. Mayford KnifeWilliams, RD, LDN, CDE Pager: [phone] After hours Pager: [phone]

## 2015-10-22 ENCOUNTER — Inpatient Hospital Stay (HOSPITAL_COMMUNITY): Payer: Medicare Other

## 2015-10-22 DIAGNOSIS — Z9911 Dependence on respirator [ventilator] status: Secondary | ICD-10-CM

## 2015-10-22 DIAGNOSIS — G9340 Encephalopathy, unspecified: Secondary | ICD-10-CM

## 2015-10-22 DIAGNOSIS — Z951 Presence of aortocoronary bypass graft: Secondary | ICD-10-CM

## 2015-10-22 LAB — COMPREHENSIVE METABOLIC PANEL
ALT: 19 U/L (ref 17–63)
AST: 25 U/L (ref 15–41)
Albumin: 2.8 g/dL — ABNORMAL LOW (ref 3.5–5.0)
Alkaline Phosphatase: 33 U/L — ABNORMAL LOW (ref 38–126)
Anion gap: 7 (ref 5–15)
BUN: 14 mg/dL (ref 6–20)
CO2: 25 mmol/L (ref 22–32)
Calcium: 7.8 mg/dL — ABNORMAL LOW (ref 8.9–10.3)
Chloride: 106 mmol/L (ref 101–111)
Creatinine, Ser: 1.31 mg/dL — ABNORMAL HIGH (ref 0.61–1.24)
GFR calc Af Amer: >60 mL/min (ref >60)
GFR calc non Af Amer: 53 mL/min — ABNORMAL LOW (ref >60)
Glucose, Bld: 160 mg/dL — ABNORMAL HIGH (ref 65–99)
Potassium: 3.9 mmol/L (ref 3.5–5.1)
Sodium: 138 mmol/L (ref 135–145)
Total Bilirubin: 0.6 mg/dL (ref 0.3–1.2)
Total Protein: 5.1 g/dL — ABNORMAL LOW (ref 6.5–8.1)

## 2015-10-22 LAB — POCT I-STAT 3, ART BLOOD GAS (G3+)
Acid-Base Excess: 2 mmol/L (ref 0.0–2.0)
Bicarbonate: 26.4 mEq/L — ABNORMAL HIGH (ref 20.0–24.0)
O2 Saturation: 94 %
PCO2 ART: 38.2 mmHg (ref 35.0–45.0)
PO2 ART: 67 mmHg — AB (ref 80.0–100.0)
Patient temperature: 36.5
TCO2: 28 mmol/L (ref 0–100)
pH, Arterial: 7.446 (ref 7.350–7.450)

## 2015-10-22 LAB — GLUCOSE, CAPILLARY
GLUCOSE-CAPILLARY: 127 mg/dL — AB (ref 65–99)
GLUCOSE-CAPILLARY: 140 mg/dL — AB (ref 65–99)
GLUCOSE-CAPILLARY: 143 mg/dL — AB (ref 65–99)
GLUCOSE-CAPILLARY: 149 mg/dL — AB (ref 65–99)
GLUCOSE-CAPILLARY: 150 mg/dL — AB (ref 65–99)
GLUCOSE-CAPILLARY: 150 mg/dL — AB (ref 65–99)
GLUCOSE-CAPILLARY: 154 mg/dL — AB (ref 65–99)
GLUCOSE-CAPILLARY: 162 mg/dL — AB (ref 65–99)
GLUCOSE-CAPILLARY: 164 mg/dL — AB (ref 65–99)
Glucose-Capillary: 140 mg/dL — ABNORMAL HIGH (ref 65–99)
Glucose-Capillary: 166 mg/dL — ABNORMAL HIGH (ref 65–99)
Glucose-Capillary: 173 mg/dL — ABNORMAL HIGH (ref 65–99)
Glucose-Capillary: 157 mg/dL — ABNORMAL HIGH (ref 65–99)
Glucose-Capillary: 138 mg/dL — ABNORMAL HIGH (ref 65–99)
Glucose-Capillary: 157 mg/dL — ABNORMAL HIGH (ref 65–99)
Glucose-Capillary: 168 mg/dL — ABNORMAL HIGH (ref 65–99)

## 2015-10-22 LAB — CBC
HEMATOCRIT: 20.9 % — AB (ref 39.0–52.0)
HEMOGLOBIN: 7.2 g/dL — AB (ref 13.0–17.0)
MCH: 30.5 pg (ref 26.0–34.0)
MCHC: 34.4 g/dL (ref 30.0–36.0)
MCV: 88.6 fL (ref 78.0–100.0)
Platelets: 87 10*3/uL — ABNORMAL LOW (ref 150–400)
RBC: 2.36 MIL/uL — ABNORMAL LOW (ref 4.22–5.81)
RDW: 14.2 % (ref 11.5–15.5)
WBC: 7.8 10*3/uL (ref 4.0–10.5)

## 2015-10-22 MED ORDER — INSULIN DETEMIR 100 UNIT/ML ~~LOC~~ SOLN
35.0000 [IU] | Freq: Every day | SUBCUTANEOUS | Status: DC
  Administered 2015-10-22 – 2015-10-23 (×2): 35 [IU] via SUBCUTANEOUS
  Filled 2015-10-22 (×3): qty 0.35

## 2015-10-22 MED ORDER — QUETIAPINE FUMARATE 25 MG PO TABS
50.0000 mg | ORAL_TABLET | Freq: Two times a day (BID) | ORAL | Status: DC
  Administered 2015-10-22 – 2015-10-23 (×3): 50 mg via ORAL
  Filled 2015-10-22 (×3): qty 2

## 2015-10-22 MED ORDER — ENOXAPARIN SODIUM 40 MG/0.4ML ~~LOC~~ SOLN
40.0000 mg | SUBCUTANEOUS | Status: DC
  Administered 2015-10-22 – 2015-11-14 (×23): 40 mg via SUBCUTANEOUS
  Filled 2015-10-22 (×23): qty 0.4

## 2015-10-22 MED ORDER — FUROSEMIDE 10 MG/ML IJ SOLN
8.0000 mg/h | INTRAVENOUS | Status: DC
  Administered 2015-10-22 – 2015-10-26 (×6): 8 mg/h via INTRAVENOUS
  Filled 2015-10-22 (×9): qty 25

## 2015-10-22 MED ORDER — POTASSIUM CHLORIDE 10 MEQ/50ML IV SOLN
10.0000 meq | INTRAVENOUS | Status: AC
  Administered 2015-10-22 (×2): 10 meq via INTRAVENOUS
  Filled 2015-10-22: qty 50

## 2015-10-22 MED ORDER — INSULIN ASPART 100 UNIT/ML ~~LOC~~ SOLN
0.0000 [IU] | SUBCUTANEOUS | Status: DC
  Administered 2015-10-22 (×2): 3 [IU] via SUBCUTANEOUS
  Administered 2015-10-22 – 2015-10-23 (×3): 4 [IU] via SUBCUTANEOUS
  Administered 2015-10-23: 3 [IU] via SUBCUTANEOUS
  Administered 2015-10-23: 4 [IU] via SUBCUTANEOUS
  Administered 2015-10-23: 3 [IU] via SUBCUTANEOUS
  Administered 2015-10-23: 4 [IU] via SUBCUTANEOUS
  Administered 2015-10-24 (×2): 3 [IU] via SUBCUTANEOUS
  Administered 2015-10-24 (×2): 4 [IU] via SUBCUTANEOUS
  Administered 2015-10-24 (×2): 2 [IU] via SUBCUTANEOUS
  Administered 2015-10-25 (×2): 4 [IU] via SUBCUTANEOUS
  Administered 2015-10-25: 7 [IU] via SUBCUTANEOUS
  Administered 2015-10-25: 4 [IU] via SUBCUTANEOUS
  Administered 2015-10-25: 3 [IU] via SUBCUTANEOUS
  Administered 2015-10-26: 7 [IU] via SUBCUTANEOUS
  Administered 2015-10-26: 4 [IU] via SUBCUTANEOUS
  Administered 2015-10-26 (×2): 7 [IU] via SUBCUTANEOUS
  Administered 2015-10-26: 4 [IU] via SUBCUTANEOUS
  Administered 2015-10-26: 7 [IU] via SUBCUTANEOUS
  Administered 2015-10-27: 4 [IU] via SUBCUTANEOUS
  Administered 2015-10-27: 3 [IU] via SUBCUTANEOUS
  Administered 2015-10-27 – 2015-10-28 (×6): 4 [IU] via SUBCUTANEOUS
  Administered 2015-10-28 (×2): 3 [IU] via SUBCUTANEOUS
  Administered 2015-10-28: 4 [IU] via SUBCUTANEOUS
  Administered 2015-10-29: 3 [IU] via SUBCUTANEOUS
  Administered 2015-10-29 (×3): 4 [IU] via SUBCUTANEOUS
  Administered 2015-10-29: 3 [IU] via SUBCUTANEOUS
  Administered 2015-10-29 – 2015-10-30 (×3): 4 [IU] via SUBCUTANEOUS
  Administered 2015-10-30: 7 [IU] via SUBCUTANEOUS
  Administered 2015-10-30 (×2): 3 [IU] via SUBCUTANEOUS
  Administered 2015-10-30 – 2015-10-31 (×3): 4 [IU] via SUBCUTANEOUS
  Administered 2015-10-31: 3 [IU] via SUBCUTANEOUS
  Administered 2015-10-31: 4 [IU] via SUBCUTANEOUS
  Administered 2015-11-01: 3 [IU] via SUBCUTANEOUS
  Administered 2015-11-01: 4 [IU] via SUBCUTANEOUS
  Administered 2015-11-01: 3 [IU] via SUBCUTANEOUS
  Administered 2015-11-02: 4 [IU] via SUBCUTANEOUS
  Administered 2015-11-02: 3 [IU] via SUBCUTANEOUS
  Administered 2015-11-02 (×3): 4 [IU] via SUBCUTANEOUS
  Administered 2015-11-02: 7 [IU] via SUBCUTANEOUS
  Administered 2015-11-03: 4 [IU] via SUBCUTANEOUS
  Administered 2015-11-03 (×2): 7 [IU] via SUBCUTANEOUS
  Administered 2015-11-03: 4 [IU] via SUBCUTANEOUS
  Administered 2015-11-03: 7 [IU] via SUBCUTANEOUS
  Administered 2015-11-03: 4 [IU] via SUBCUTANEOUS
  Administered 2015-11-04: 7 [IU] via SUBCUTANEOUS
  Administered 2015-11-04 (×5): 4 [IU] via SUBCUTANEOUS
  Administered 2015-11-04: 11 [IU] via SUBCUTANEOUS
  Administered 2015-11-05: 3 [IU] via SUBCUTANEOUS
  Administered 2015-11-05: 4 [IU] via SUBCUTANEOUS
  Administered 2015-11-05: 7 [IU] via SUBCUTANEOUS
  Administered 2015-11-05: 3 [IU] via SUBCUTANEOUS
  Administered 2015-11-05: 4 [IU] via SUBCUTANEOUS
  Administered 2015-11-06: 2 [IU] via SUBCUTANEOUS
  Administered 2015-11-06: 7 [IU] via SUBCUTANEOUS
  Administered 2015-11-06: 3 [IU] via SUBCUTANEOUS
  Administered 2015-11-06 – 2015-11-07 (×3): 4 [IU] via SUBCUTANEOUS
  Administered 2015-11-07: 3 [IU] via SUBCUTANEOUS
  Administered 2015-11-07: 7 [IU] via SUBCUTANEOUS
  Administered 2015-11-07 – 2015-11-08 (×3): 4 [IU] via SUBCUTANEOUS
  Administered 2015-11-08 – 2015-11-09 (×2): 3 [IU] via SUBCUTANEOUS
  Administered 2015-11-09 – 2015-11-10 (×4): 4 [IU] via SUBCUTANEOUS
  Administered 2015-11-10 – 2015-11-11 (×4): 3 [IU] via SUBCUTANEOUS
  Administered 2015-11-11: 4 [IU] via SUBCUTANEOUS
  Administered 2015-11-11 – 2015-11-12 (×2): 3 [IU] via SUBCUTANEOUS
  Administered 2015-11-12 (×2): 4 [IU] via SUBCUTANEOUS
  Administered 2015-11-12: 3 [IU] via SUBCUTANEOUS

## 2015-10-22 MED ORDER — SODIUM CHLORIDE 0.9 % IV SOLN
1.0000 mg/h | INTRAVENOUS | Status: DC
  Administered 2015-10-22 – 2015-10-23 (×2): 2 mg/h via INTRAVENOUS
  Administered 2015-10-25 (×2): 1 mg/h via INTRAVENOUS
  Filled 2015-10-22 (×8): qty 10

## 2015-10-22 NOTE — Progress Notes (Signed)
Name: Tyler Le MRN: [id number] DOB: [birth year]    ADMISSION DATE:  10/16/2015 CONSULTATION DATE:  10/18/15  REFERRING MD : Donata ClayVan Trigt, MD  CHIEF COMPLAINT:  Hypercapnia  BRIEF PATIENT DESCRIPTION: 70 yr old obese, needs redo CABG, called for Acute on chronic resp acidosis   SUBJECTIVE:  CABG redo and CEA 4/28 Significant agitation limiting extubation  VITAL SIGNS: Temp:  [97.5 F (36.4 C)-99 F (37.2 C)] 98.4 F (36.9 C) (04/30 1000) Pulse Rate:  [77-100] 95 (04/30 1130) Resp:  [13-20] 14 (04/30 1130) BP: (85-135)/(47-65) 117/65 mmHg (04/30 1100) SpO2:  [91 %-100 %] 100 % (04/30 1130) Arterial Line BP: (80-159)/(38-61) 129/58 mmHg (04/30 1100) FiO2 (%):  [50 %] 50 % (04/30 1130) Weight:  [138.7 kg (305 lb 12.5 oz)] 138.7 kg (305 lb 12.5 oz) (04/30 0500)  PHYSICAL EXAMINATION: General:  Sleepy on vent HENT: NCAT ETT in place PULM: CTA B vents supported breaths CV: midline scar dressing in place, mediastinal and chest tubes in place GI: BS+, soft Derm: no rash Neuro: heavily sedated on vent   Recent Labs Lab 10/20/15 0542  10/21/15 0432 10/21/15 1634 10/21/15 1641 10/22/15 0455  NA 136  < > 139  --  139 138  K 3.9  < > 4.1  --  4.0 3.9  CL 101  < > 108  --  101 106  CO2 25  --  25  --   --  25  BUN 15  < > 12  --  16 14  CREATININE 1.39*  < > 1.42* 1.53* 1.40* 1.31*  GLUCOSE 130*  < > 115*  --  147* 160*  < > = values in this interval not displayed.  Recent Labs Lab 10/21/15 0436 10/21/15 1634 10/21/15 1641 10/22/15 0455  HGB 8.6* 8.0* 7.8* 7.2*  HCT 25.0* 23.2* 23.0* 20.9*  WBC 8.9 9.7  --  7.8  PLT 117* 101*  --  87*   Dg Chest Port 1 View  10/22/2015  CLINICAL DATA:  70 year old male with history of CABG. EXAM: PORTABLE CHEST 1 VIEW COMPARISON:  Chest x-ray 10/21/2015. FINDINGS: An endotracheal tube is in place with tip 7.1 cm above the carina. A nasogastric tube is seen extending into the stomach, however, the tip of the nasogastric tube  extends below the lower margin of the image. Left-sided subclavian Cordis through which a Swan-Ganz catheter has been passed into the proximal pulmonic trunk. Lung volumes are low. Bibasilar opacities appear slightly increased, favored to reflect areas of postoperative subsegmental atelectasis. Probable small left pleural effusion. Vascular crowding, accentuated by low lung volumes, without frank pulmonary edema. Widening of the cardiomediastinal contours, within normal limits for a postoperative patient, and similar to the prior study. Atherosclerosis in the thoracic aorta. Status post median sternotomy for CABG, including LIMA. IMPRESSION: 1. Postoperative changes and support apparatus, as above. 2. Persistent low lung volumes with slight worsening of bibasilar postoperative subsegmental atelectasis. 3. Small left pleural effusion. Electronically Signed   By: Trudie Reedaniel  Entrikin M.D.   On: 10/22/2015 09:26   Dg Chest Port 1 View  10/21/2015  CLINICAL DATA:  Patient status post CABG procedure. EXAM: PORTABLE CHEST 1 VIEW COMPARISON:  Chest radiograph 10/20/2015. FINDINGS: ET tube terminates in the mid trachea. Enteric tube projects over the stomach. Pulmonary arterial catheter tip projects over the main pulmonary artery. Patient is rotated to the left. Stable enlarged cardiac and mediastinal contours. Unchanged small left pleural effusion with underlying pulmonary consolidation. Tiny right pleural effusion.  No pneumothorax. IMPRESSION: Stable support apparatus. Unchanged left lower lung consolidation and small left pleural effusion. Tiny right pleural effusion. Electronically Signed   By: Annia Belt M.D.   On: 10/21/2015 07:28   Dg Chest Portable 1 View  10/20/2015  CLINICAL DATA:  S/p cabg x 3 done in OR EXAM: PORTABLE CHEST 1 VIEW COMPARISON:  10/19/2015 FINDINGS: Cardiac silhouette enlargement similar to prior study. Swan-Ganz since venous catheter tip projects over the main pulmonary artery. Endotracheal  tube tip is about 5 cm above the carina. NG tube crosses the gastroesophageal junction. Mild hypoventilatory change right lung. More extensive left lower lobe consolidation. IMPRESSION: Postoperative left lower lobe consolidation. Lines and tubes as described. Electronically Signed   By: Esperanza Heir M.D.   On: 10/20/2015 18:00    STUDIES:  PFT 4/25- restrictive lung dz, low DLCO diffusion  CULTURES:   ANTIBIOTICS: Cefuroxime 4/28 >   SIGNIFICANT EVENTS: 4/28 CABG and CEA  LINES/TUBES: 4/28 midine R arm >  4/28 L subclavian introducer >  4/28 R neck drain >  4/28 mediastinal  chest tubes >    ASSESSMENT / PLAN:   DISCUSSION: 70 y/o male with obesity hypoventilation syndrome who underwent a redo CABG and CEA on 4/28.  ASSESSMENT / PLAN:  PULMONARY A: Vent dependence Chronic respiratory failure with hypercapnea Obesity hypoventilation syndrome P:   Continue full vent support until mental status improves Change to PRVC Will need nocturnal BIPAP post extubation  NEUROLOGIC A:   Acute encephalopathy>severe agitation, ddx broad including metabolic encephalopathy given baseline issues and medications given here, suspect baseline mental illness given multiple psychoactive meds at home including seroquel Chronic pain at home P:   RASS goal: -1 Continue fentanyl gtt Add seroquel low dose and follow QTc interval closely, titrate as able PAD protocol with precedex  CARDIOVASCULAR A:  S/p CABG and CEA P:  Per vascular and TCTS  RENAL A:   AKI P:   Continue vasopressors and fluids as ordered Monitor BMET and UOP Replace electrolytes as needed  GASTROINTESTINAL A:   No acute issues P:   OG tube PPI Nutrition per surgery  HEMATOLOGIC A:   Anemia, no bleeding P:  Transfusion per usual ICU guidelines  INFECTIOUS A:   Fever post op, normal WBC so likely atelectasis P:   Monitor  Cefuroxime per surgery protocol  ENDOCRINE A:   Hyperglycemia P:     Monitor off insulin gtt Continue home liraglutide, SSI    FAMILY  - Updates: updated bedside 4/29  My cc time 34 minutes  Heber [place], MD [place] [facility] Pager: [phone] Cell: [phone] After 3pm or if no response, call [phone]  10/22/2015, 2:22 PM

## 2015-10-22 NOTE — Progress Notes (Signed)
   Daily Progress Note  Assessment/Planning: POD #2 s/p R CEA/CABG   Drain can come out  Still can't get exam as agitated off sedation  Subjective  - 2 Days Post-Op  Agitated with lightening of sedation  Objective Filed Vitals:   10/22/15 0607 10/22/15 0700 10/22/15 0800 10/22/15 0820  BP:  105/52 102/52   Pulse: 87 86 90 90  Temp: 98.2 F (36.8 C) 98.2 F (36.8 C) 98.6 F (37 C)   TempSrc:      Resp: 13 14 14 14   Height:      Weight:      SpO2: 95% 97% 93% 96%    Intake/Output Summary (Last 24 hours) at 10/22/15 0904 Last data filed at 10/22/15 0800  Gross per 24 hour  Intake 2918.23 ml  Output   2805 ml  Net 113.23 ml    NECK no significant hematoma, JP with serosang drainage 50 cc/24 hr NEURO not following instructions  Laboratory CBC    Component Value Date/Time   WBC 7.8 10/22/2015 0455   HGB 7.2* 10/22/2015 0455   HCT 20.9* 10/22/2015 0455   PLT 87* 10/22/2015 0455    BMET    Component Value Date/Time   NA 138 10/22/2015 0455   K 3.9 10/22/2015 0455   CL 106 10/22/2015 0455   CO2 25 10/22/2015 0455   GLUCOSE 160* 10/22/2015 0455   BUN 14 10/22/2015 0455   CREATININE 1.31* 10/22/2015 0455   CREATININE 1.40* 10/11/2015 1637   CALCIUM 7.8* 10/22/2015 0455   GFRNONAA 53* 10/22/2015 0455   GFRAA >60 10/22/2015 0455    Leonides SakeBrian Chen, MD [facility] Office: [phone] Pager: [phone]  10/22/2015, 9:04 AM

## 2015-10-22 NOTE — Progress Notes (Signed)
2 Days Post-Op Procedure(s) (LRB): REDO CORONARY ARTERY BYPASS GRAFTING (CABG), TIMES THREE, USING LEFT GREATER SAPHENOUS VEIN HARVESTED ENDOSCOPICALLY AND CRYO VEIN, WITH CORONARY ENDARTERECTOMY (N/A) TRANSESOPHAGEAL ECHOCARDIOGRAM (TEE) (N/A) ENDARTERECTOMY CAROTID (Right) Subjective: Agitated when sedation lightened Moves all 4 ext with good strength  Objective: Vital signs in last 24 hours: Temp:  [97.5 F (36.4 C)-100.2 F (37.9 C)] 98.6 F (37 C) (04/30 0800) Pulse Rate:  [77-100] 90 (04/30 0900) Cardiac Rhythm:  [-] Normal sinus rhythm;Bundle branch block (04/30 0800) Resp:  [13-20] 14 (04/30 0900) BP: (85-135)/(47-63) 130/63 mmHg (04/30 0900) SpO2:  [91 %-98 %] 91 % (04/30 0900) Arterial Line BP: (80-163)/(38-61) 140/60 mmHg (04/30 0900) FiO2 (%):  [50 %] 50 % (04/30 0820) Weight:  [305 lb 12.5 oz (138.7 kg)] 305 lb 12.5 oz (138.7 kg) (04/30 0500)  Hemodynamic parameters for last 24 hours: PAP: (24-42)/(6-23) 32/8 mmHg CO:  [5.8 L/min-6.5 L/min] 5.8 L/min CI:  [2.3 L/min/m2-2.5 L/min/m2] 2.3 L/min/m2  Intake/Output from previous day: 04/29 0701 - 04/30 0700 In: 3344.6 [I.V.:2704.6; NG/GT:240; IV Piggyback:400] Out: 2890 [Urine:2190; Emesis/NG output:350; Drains:70; Chest Tube:280] Intake/Output this shift: Total I/O In: 70 [I.V.:40; NG/GT:30] Out: 50 [Urine:50]  Neurologic: non focal, not following commands Heart: regular rate and rhythm Lungs: rhonchi bilaterally Abdomen: normal findings: mildly distended  Lab Results:  Recent Labs  10/21/15 1634 10/21/15 1641 10/22/15 0455  WBC 9.7  --  7.8  HGB 8.0* 7.8* 7.2*  HCT 23.2* 23.0* 20.9*  PLT 101*  --  87*   BMET:  Recent Labs  10/21/15 0432  10/21/15 1641 10/22/15 0455  NA 139  --  139 138  K 4.1  --  4.0 3.9  CL 108  --  101 106  CO2 25  --   --  25  GLUCOSE 115*  --  147* 160*  BUN 12  --  16 14  CREATININE 1.42*  < > 1.40* 1.31*  CALCIUM 8.3*  --   --  7.8*  < > = values in this interval  not displayed.  PT/INR:  Recent Labs  10/20/15 1810  LABPROT 16.8*  INR 1.35   ABG    Component Value Date/Time   PHART 7.446 10/22/2015 0409   HCO3 26.4* 10/22/2015 0409   TCO2 28 10/22/2015 0409   ACIDBASEDEF 1.0 10/20/2015 1824   O2SAT 94.0 10/22/2015 0409   CBG (last 3)   Recent Labs  10/21/15 2058 10/21/15 2202 10/21/15 2326  GLUCAP 165* 157* 143*    Assessment/Plan: S/P Procedure(s) (LRB): REDO CORONARY ARTERY BYPASS GRAFTING (CABG), TIMES THREE, USING LEFT GREATER SAPHENOUS VEIN HARVESTED ENDOSCOPICALLY AND CRYO VEIN, WITH CORONARY ENDARTERECTOMY (N/A) TRANSESOPHAGEAL ECHOCARDIOGRAM (TEE) (N/A) ENDARTERECTOMY CAROTID (Right) POD # 2   NEURO- nonfocal but agitated and not following commands  Sedation as needed, restraints as needed for safety  CV- hemodynamics stable. Decrease milrinone to 0.125  RESP- unable to wean due to agitation  RENAL- creatinine down slightly  Volume overloaded- start lasix drip  Lytes OK  ENDO- CBG mildly elevated- will change to levemir + q4 SSI  SCD + enoxaparin for DVT prophylaxis  DC CT   LOS: 6 days    Loreli SlotSteven C Gaila Engebretsen 10/22/2015

## 2015-10-22 NOTE — Progress Notes (Signed)
eLink Physician-Brief Progress Note Patient Name: Tyler DeutscherDwight E Monda DOB: 24-Feb-1946 MRN: [id number]   Date of Service  10/22/2015  HPI/Events of Note  Agitation, pulling at tubes and lines despite fentanyl gtt, precedex and versed prn  eICU Interventions  Add low dose versed gtt overnight     Intervention Category Major Interventions: Change in mental status - evaluation and management  Max FickleDouglas Alannah Averhart 10/22/2015, 10:15 PM

## 2015-10-22 NOTE — Progress Notes (Signed)
      [address]       [place] [id number]             [phone]      Intubated  Sedated- still agitated when light  BP 129/66 mmHg  Pulse 90  Temp(Src) 99 F (37.2 C) (Core (Comment))  Resp 14  Ht 6\' 4"  (1.93 m)  Wt 305 lb 12.5 oz (138.7 kg)  BMI 37.24 kg/m2  SpO2 99%   Intake/Output Summary (Last 24 hours) at 10/22/15 1808 Last data filed at 10/22/15 1600  Gross per 24 hour  Intake 2086.68 ml  Output   1680 ml  Net 406.68 ml   Sedation adjusted by CCM  Aubreyana Saltz C. Dorris FetchHendrickson, MD [facility] [phone]

## 2015-10-23 ENCOUNTER — Inpatient Hospital Stay (HOSPITAL_COMMUNITY): Payer: Medicare Other

## 2015-10-23 ENCOUNTER — Encounter (HOSPITAL_COMMUNITY): Payer: Self-pay | Admitting: Cardiothoracic Surgery

## 2015-10-23 DIAGNOSIS — I25810 Atherosclerosis of coronary artery bypass graft(s) without angina pectoris: Secondary | ICD-10-CM

## 2015-10-23 DIAGNOSIS — J9601 Acute respiratory failure with hypoxia: Secondary | ICD-10-CM

## 2015-10-23 DIAGNOSIS — R404 Transient alteration of awareness: Secondary | ICD-10-CM

## 2015-10-23 LAB — TYPE AND SCREEN
ABO/RH(D): O NEG
Antibody Screen: NEGATIVE
Unit division: 0
Unit division: 0
Unit division: 0
Unit division: 0
Unit division: 0
Unit division: 0

## 2015-10-23 LAB — CBC
HCT: 22.5 % — ABNORMAL LOW (ref 39.0–52.0)
Hemoglobin: 7.7 g/dL — ABNORMAL LOW (ref 13.0–17.0)
MCH: 30.8 pg (ref 26.0–34.0)
MCHC: 34.2 g/dL (ref 30.0–36.0)
MCV: 90 fL (ref 78.0–100.0)
PLATELETS: 108 10*3/uL — AB (ref 150–400)
RBC: 2.5 MIL/uL — ABNORMAL LOW (ref 4.22–5.81)
RDW: 14.3 % (ref 11.5–15.5)
WBC: 8.2 10*3/uL (ref 4.0–10.5)

## 2015-10-23 LAB — COMPREHENSIVE METABOLIC PANEL
ALT: 19 U/L (ref 17–63)
AST: 19 U/L (ref 15–41)
Albumin: 2.8 g/dL — ABNORMAL LOW (ref 3.5–5.0)
Alkaline Phosphatase: 39 U/L (ref 38–126)
Anion gap: 10 (ref 5–15)
BUN: 20 mg/dL (ref 6–20)
CO2: 26 mmol/L (ref 22–32)
Calcium: 7.6 mg/dL — ABNORMAL LOW (ref 8.9–10.3)
Chloride: 100 mmol/L — ABNORMAL LOW (ref 101–111)
Creatinine, Ser: 1.45 mg/dL — ABNORMAL HIGH (ref 0.61–1.24)
GFR calc Af Amer: 54 mL/min — ABNORMAL LOW (ref >60)
GFR calc non Af Amer: 47 mL/min — ABNORMAL LOW (ref >60)
Glucose, Bld: 159 mg/dL — ABNORMAL HIGH (ref 65–99)
Potassium: 3.7 mmol/L (ref 3.5–5.1)
Sodium: 136 mmol/L (ref 135–145)
Total Bilirubin: 0.5 mg/dL (ref 0.3–1.2)
Total Protein: 5.5 g/dL — ABNORMAL LOW (ref 6.5–8.1)

## 2015-10-23 LAB — GLUCOSE, CAPILLARY
GLUCOSE-CAPILLARY: 152 mg/dL — AB (ref 65–99)
Glucose-Capillary: 155 mg/dL — ABNORMAL HIGH (ref 65–99)
Glucose-Capillary: 151 mg/dL — ABNORMAL HIGH (ref 65–99)
Glucose-Capillary: 167 mg/dL — ABNORMAL HIGH (ref 65–99)
Glucose-Capillary: 144 mg/dL — ABNORMAL HIGH (ref 65–99)

## 2015-10-23 LAB — POCT I-STAT 3, ART BLOOD GAS (G3+)
Acid-Base Excess: 4 mmol/L — ABNORMAL HIGH (ref 0.0–2.0)
BICARBONATE: 26.9 meq/L — AB (ref 20.0–24.0)
O2 SAT: 96 %
PCO2 ART: 33.8 mmHg — AB (ref 35.0–45.0)
PO2 ART: 70 mmHg — AB (ref 80.0–100.0)
Patient temperature: 36.8
TCO2: 28 mmol/L (ref 0–100)
pH, Arterial: 7.508 — ABNORMAL HIGH (ref 7.350–7.450)

## 2015-10-23 LAB — PREPARE RBC (CROSSMATCH)

## 2015-10-23 LAB — AMMONIA: Ammonia: 23 umol/L (ref 9–35)

## 2015-10-23 MED ORDER — POTASSIUM CHLORIDE 10 MEQ/50ML IV SOLN
10.0000 meq | Freq: Once | INTRAVENOUS | Status: AC
  Administered 2015-10-23: 10 meq via INTRAVENOUS

## 2015-10-23 MED ORDER — METOCLOPRAMIDE HCL 5 MG/ML IJ SOLN
5.0000 mg | Freq: Three times a day (TID) | INTRAMUSCULAR | Status: DC
  Administered 2015-10-23 – 2015-10-24 (×2): 5 mg via INTRAVENOUS
  Filled 2015-10-23 (×2): qty 2

## 2015-10-23 MED ORDER — POTASSIUM CHLORIDE 10 MEQ/50ML IV SOLN
10.0000 meq | INTRAVENOUS | Status: AC
  Administered 2015-10-23 (×3): 10 meq via INTRAVENOUS
  Filled 2015-10-23 (×4): qty 50

## 2015-10-23 MED ORDER — VITAL HIGH PROTEIN PO LIQD
1000.0000 mL | ORAL | Status: DC
  Administered 2015-10-24: 1000 mL

## 2015-10-23 MED ORDER — QUETIAPINE FUMARATE 100 MG PO TABS
100.0000 mg | ORAL_TABLET | Freq: Two times a day (BID) | ORAL | Status: DC
  Administered 2015-10-23: 100 mg via ORAL
  Filled 2015-10-23: qty 1

## 2015-10-23 MED ORDER — VITAL 1.0 CAL PO LIQD
1000.0000 mL | ORAL | Status: DC
  Filled 2015-10-23 (×2): qty 1500

## 2015-10-23 MED FILL — Sodium Bicarbonate IV Soln 8.4%: INTRAVENOUS | Qty: 50 | Status: AC

## 2015-10-23 MED FILL — Mannitol IV Soln 20%: INTRAVENOUS | Qty: 500 | Status: AC

## 2015-10-23 MED FILL — Heparin Sodium (Porcine) Inj 1000 Unit/ML: INTRAMUSCULAR | Qty: 30 | Status: AC

## 2015-10-23 MED FILL — Calcium Chloride Inj 10%: INTRAVENOUS | Qty: 10 | Status: AC

## 2015-10-23 MED FILL — Lidocaine HCl IV Inj 20 MG/ML: INTRAVENOUS | Qty: 5 | Status: AC

## 2015-10-23 MED FILL — Electrolyte-R (PH 7.4) Solution: INTRAVENOUS | Qty: 4000 | Status: AC

## 2015-10-23 MED FILL — Sodium Chloride IV Soln 0.9%: INTRAVENOUS | Qty: 4000 | Status: AC

## 2015-10-23 NOTE — Progress Notes (Signed)
Name: Tyler Le MRN: [id number] DOB: March 30, 1946    ADMISSION DATE:  10/16/2015 CONSULTATION DATE:  10/18/15  REFERRING MD : Donata Clay, MD  CHIEF COMPLAINT:  Hypercapnia  BRIEF PATIENT DESCRIPTION: 70 yr old obese, needs redo CABG, called for Acute on chronic resp acidosis   SUBJECTIVE:  RN reports ongoing agitation / anxiety with reduction of sedation - develops bradycardia & hypotension with episodes.  Has not tolerated reduction of pacing.  Remains on milrinone, lasix, dopamine, versed, precedex and fentanyl   VITAL SIGNS: Temp:  [97.7 F (36.5 C)-99.1 F (37.3 C)] 97.7 F (36.5 C) (05/01 0900) Pulse Rate:  [78-105] 90 (05/01 0900) Resp:  [14] 14 (05/01 0900) BP: (61-153)/(52-78) 126/65 mmHg (05/01 0900) SpO2:  [93 %-100 %] 95 % (05/01 0900) Arterial Line BP: (49-161)/(25-79) 124/55 mmHg (05/01 0900) FiO2 (%):  [50 %] 50 % (05/01 0804) Weight:  [302 lb 7.5 oz (137.2 kg)] 302 lb 7.5 oz (137.2 kg) (05/01 0400)  PHYSICAL EXAMINATION: General:  Morbidly obese male, sedate on vent, no acute distress HENT: NCAT, ETT in place, staples to right neck  PULM: CTA B vents supported breaths, diminished L lower CV: midline scar dressing in place, s1s2 regular, distant tones, paced rhythm on monitor  GI: BS+, soft Derm: no rash Neuro: heavily sedated on vent   Recent Labs Lab 10/21/15 0432  10/21/15 1641 10/22/15 0455 10/23/15 0410  NA 139  --  139 138 136  K 4.1  --  4.0 3.9 3.7  CL 108  --  101 106 100*  CO2 25  --   --  25 26  BUN 12  --  16 14 20   CREATININE 1.42*  < > 1.40* 1.31* 1.45*  GLUCOSE 115*  --  147* 160* 159*  < > = values in this interval not displayed.  Recent Labs Lab 10/21/15 1634 10/21/15 1641 10/22/15 0455 10/23/15 0410  HGB 8.0* 7.8* 7.2* 7.7*  HCT 23.2* 23.0* 20.9* 22.5*  WBC 9.7  --  7.8 8.2  PLT 101*  --  87* 108*   Dg Chest Port 1 View  10/23/2015  CLINICAL DATA:  Atelectasis EXAM: PORTABLE CHEST 1 VIEW COMPARISON:  10/22/2015  FINDINGS: Support devices are stable. Cardiomegaly. Prior CABG. Improving aeration in the right lung base. Continued left basilar atelectasis with small left effusion. No pneumothorax. IMPRESSION: Stable support devices. Improving aeration at the right lung base with continued left base atelectasis and small left effusion. Electronically Signed   By: Charlett Nose M.D.   On: 10/23/2015 07:50   Dg Chest Port 1 View  10/22/2015  CLINICAL DATA:  70 year old male with history of CABG. EXAM: PORTABLE CHEST 1 VIEW COMPARISON:  Chest x-ray 10/21/2015. FINDINGS: An endotracheal tube is in place with tip 7.1 cm above the carina. A nasogastric tube is seen extending into the stomach, however, the tip of the nasogastric tube extends below the lower margin of the image. Left-sided subclavian Cordis through which a Swan-Ganz catheter has been passed into the proximal pulmonic trunk. Lung volumes are low. Bibasilar opacities appear slightly increased, favored to reflect areas of postoperative subsegmental atelectasis. Probable small left pleural effusion. Vascular crowding, accentuated by low lung volumes, without frank pulmonary edema. Widening of the cardiomediastinal contours, within normal limits for a postoperative patient, and similar to the prior study. Atherosclerosis in the thoracic aorta. Status post median sternotomy for CABG, including LIMA. IMPRESSION: 1. Postoperative changes and support apparatus, as above. 2. Persistent low lung volumes with  slight worsening of bibasilar postoperative subsegmental atelectasis. 3. Small left pleural effusion. Electronically Signed   By: Trudie Reed M.D.   On: 10/22/2015 09:26    STUDIES:  PFT 4/25 >> restrictive lung dz, low DLCO diffusion  CULTURES:   ANTIBIOTICS: Cefuroxime 4/28 >> 4/30  SIGNIFICANT EVENTS: 4/28 CABG and CEA  LINES/TUBES: 4/28 midine R arm >>  4/28 L subclavian introducer >  4/28 R neck drain >> 4/30 4/28 mediastinal chest tubes >>  4/30   ASSESSMENT / PLAN:   DISCUSSION: 70 y/o male with obesity hypoventilation syndrome who underwent a redo CABG and CEA on 4/28. Remains on vent post-operatively - anxiety, bradycardia/hypotension as barriers to extubation.   ASSESSMENT / PLAN:  PULMONARY A: Vent dependence Chronic respiratory failure with hypercapnea Obesity hypoventilation syndrome P:   Continue PRVC until mental status / anxiety improves Will need nocturnal BIPAP post extubation Intermittent CXR Daily SBT / WUA   NEUROLOGIC A:   Acute encephalopathy - severe agitation, ddx broad including metabolic encephalopathy given baseline issues and medications given here, suspect baseline mental illness given multiple psychoactive meds at home including seroquel Chronic pain  P:   RASS goal: -1 Continue fentanyl gtt Seroquel low dose and follow QTc (461 on 4/30) interval closely, titrate as able PAD protocol with precedex  CARDIOVASCULAR A:  S/p CABG and CEA P:  Per vascular and TCTS  RENAL A:   AKI P:   Continue vasopressors and fluids as ordered Monitor BMET and UOP Lasix gtt per TCTS Replace electrolytes as needed  GASTROINTESTINAL A:   Morbid Obesity  P:   OG tube >> place coretrack feeding tube  PPI Nutrition per surgery  HEMATOLOGIC A:   Anemia, no bleeding P:  Transfusion per usual ICU guidelines  INFECTIOUS A:   Fever post op, normal WBC so likely atelectasis P:   Monitor  Completed cefuroxime per surgery protocol  ENDOCRINE A:   Hyperglycemia P:   Monitor off insulin gtt Continue home liraglutide, SSI    FAMILY  - Updates: updated bedside 5/1.     Canary Brim, NP-C [facility] Pgr: [phone] or if no answer [phone] 10/23/2015, 9:47 AM

## 2015-10-23 NOTE — Progress Notes (Signed)
   S/P re-do CABG and bilateral carotid.  Hemodynamics are are stable.

## 2015-10-23 NOTE — Progress Notes (Signed)
Per Dr Kendrick FriesMcQuaid, placed pt on cpap/ps 5/5 at 1411. Pt with no respiratory effort at this time. Dr Kendrick FriesMcQuaid made aware. RT will continue to monitor.

## 2015-10-23 NOTE — Progress Notes (Signed)
Patient ID: Tyler Le, male   DOB: Nov 15, 1945, 70 y.o.   MRN: [id number] Vascular Surgery Progress Note  Subjective: 3 days post combined right carotid endarterectomy and redo coronary artery bypass grafting. Patient remained sedated on ventilator. Patient has awoken according to nursing staff and moved all extremities very combative. Not unexpected due to his severe COPD pre-op  Objective: Sedated on ventilator Right neck incision healing satisfactorily with no obvious hematoma Drains have been removed Neurologic exam-unable to assess  Filed Vitals:   10/23/15 0700 10/23/15 0800  BP: 128/68 132/70  Pulse: 89 90  Temp: 97.7 F (36.5 C) 97.7 F (36.5 C)  Resp: 14 14     Labs:  Recent Labs Lab 10/21/15 1641 10/22/15 0455 10/23/15 0410  CREATININE 1.40* 1.31* 1.45*    Recent Labs Lab 10/21/15 0432  10/21/15 1641 10/22/15 0455 10/23/15 0410  NA 139  --  139 138 136  K 4.1  --  4.0 3.9 3.7  CL 108  --  101 106 100*  CO2 25  --   --  25 26  BUN 12  --  16 14 20   CREATININE 1.42*  < > 1.40* 1.31* 1.45*  GLUCOSE 115*  --  147* 160* 159*  CALCIUM 8.3*  --   --  7.8* 7.6*  < > = values in this interval not displayed.  Recent Labs Lab 10/21/15 1634 10/21/15 1641 10/22/15 0455 10/23/15 0410  WBC 9.7  --  7.8 8.2  HGB 8.0* 7.8* 7.2* 7.7*  HCT 23.2* 23.0* 20.9* 22.5*  PLT 101*  --  87* 108*    Recent Labs Lab 10/17/15 0657 10/20/15 1810  INR 1.03 1.35    I/O last 3 completed shifts: In: 5239.4 [I.V.:4779.4; NG/GT:260; IV Piggyback:200] Out: 7275 [Urine:6215; Emesis/NG output:850; Drains:60; Chest Tube:150]  Imaging: Dg Chest Port 1 View  10/23/2015  CLINICAL DATA:  Atelectasis EXAM: PORTABLE CHEST 1 VIEW COMPARISON:  10/22/2015 FINDINGS: Support devices are stable. Cardiomegaly. Prior CABG. Improving aeration in the right lung base. Continued left basilar atelectasis with small left effusion. No pneumothorax. IMPRESSION: Stable support devices. Improving  aeration at the right lung base with continued left base atelectasis and small left effusion. Electronically Signed   By: Charlett NoseKevin  Dover M.D.   On: 10/23/2015 07:50   Dg Chest Port 1 View  10/22/2015  CLINICAL DATA:  70 year old male with history of CABG. EXAM: PORTABLE CHEST 1 VIEW COMPARISON:  Chest x-ray 10/21/2015. FINDINGS: An endotracheal tube is in place with tip 7.1 cm above the carina. A nasogastric tube is seen extending into the stomach, however, the tip of the nasogastric tube extends below the lower margin of the image. Left-sided subclavian Cordis through which a Swan-Ganz catheter has been passed into the proximal pulmonic trunk. Lung volumes are low. Bibasilar opacities appear slightly increased, favored to reflect areas of postoperative subsegmental atelectasis. Probable small left pleural effusion. Vascular crowding, accentuated by low lung volumes, without frank pulmonary edema. Widening of the cardiomediastinal contours, within normal limits for a postoperative patient, and similar to the prior study. Atherosclerosis in the thoracic aorta. Status post median sternotomy for CABG, including LIMA. IMPRESSION: 1. Postoperative changes and support apparatus, as above. 2. Persistent low lung volumes with slight worsening of bibasilar postoperative subsegmental atelectasis. 3. Small left pleural effusion. Electronically Signed   By: Trudie Reedaniel  Entrikin M.D.   On: 10/22/2015 09:26    Assessment/Plan:  POD 3   LOS: 7 days  s/p Procedure(s): REDO CORONARY ARTERY BYPASS  GRAFTING (CABG), TIMES THREE, USING LEFT GREATER SAPHENOUS VEIN HARVESTED ENDOSCOPICALLY AND CRYO VEIN, WITH CORONARY ENDARTERECTOMY TRANSESOPHAGEAL ECHOCARDIOGRAM (TEE) ENDARTERECTOMY CAROTID  Patient remains sedated on ventilator following right carotid endarterectomy and redo coronary artery bypass grafting We'll continue to follow along with you No obvious sign of intraoperative CVA but unable to able to totally evaluated this  time  [name], MD 10/23/2015 8:40 AM

## 2015-10-23 NOTE — Progress Notes (Addendum)
Patient ID: Tyler DeutscherDwight E Zaring, male   DOB: 1946-02-28, 70 y.o.   MRN: [id number] EVENING ROUNDS NOTE :     [address]       [place] [id number]             [phone]                 3 Days Post-Op Procedure(s) (LRB): REDO CORONARY ARTERY BYPASS GRAFTING (CABG), TIMES THREE, USING LEFT GREATER SAPHENOUS VEIN HARVESTED ENDOSCOPICALLY AND CRYO VEIN, WITH CORONARY ENDARTERECTOMY (N/A) TRANSESOPHAGEAL ECHOCARDIOGRAM (TEE) (N/A) ENDARTERECTOMY CAROTID (Right)  Total Length of Stay:  LOS: 7 days  BP 101/58 mmHg  Pulse 90  Temp(Src) 98.4 F (36.9 C) (Core (Comment))  Resp 14  Ht 6\' 4"  (1.93 m)  Wt 302 lb 7.5 oz (137.2 kg)  BMI 36.83 kg/m2  SpO2 100%  .Intake/Output      05/01 0701 - 05/02 0700   I.V. (mL/kg) 1558.1 (11.4)   NG/GT 30   IV Piggyback 100   Total Intake(mL/kg) 1688.1 (12.3)   Urine (mL/kg/hr) 3260 (1.9)   Emesis/NG output 550 (0.3)   Drains    Chest Tube    Total Output 3810   Net -2121.9         . sodium chloride 10 mL/hr at 10/23/15 1900  . sodium chloride    . sodium chloride    . sodium chloride 20 mL/hr at 10/23/15 1900  . DOPamine 3.01 mcg/kg/min (10/23/15 1900)  . EPINEPHrine 4 mg in dextrose 5% 250 mL infusion (16 mcg/mL) Stopped (10/20/15 1800)  . feeding supplement (VITAL HIGH PROTEIN)    . fentaNYL infusion INTRAVENOUS 400 mcg/hr (10/23/15 1900)  . furosemide (LASIX) infusion 8 mg/hr (10/23/15 1900)  . lactated ringers    . lactated ringers 10 mL/hr at 10/23/15 1900  . midazolam (VERSED) infusion 2 mg/hr (10/23/15 1900)  . milrinone 0.125 mcg/kg/min (10/23/15 1900)  . nitroGLYCERIN Stopped (10/20/15 0932)  . nitroGLYCERIN Stopped (10/20/15 1800)  . phenylephrine (NEO-SYNEPHRINE) Adult infusion 40 mcg/min (10/23/15 1900)     Lab Results  Component Value Date   WBC 8.2 10/23/2015   HGB 7.7* 10/23/2015   HCT 22.5* 10/23/2015   PLT 108* 10/23/2015   GLUCOSE 159* 10/23/2015   CHOL 130 05/09/2011   TRIG 359* 05/09/2011   HDL 27* 05/09/2011   LDLCALC 31 05/09/2011   ALT 19 10/23/2015   AST 19 10/23/2015   NA 136 10/23/2015   K 3.7 10/23/2015   CL 100* 10/23/2015   CREATININE 1.45* 10/23/2015   BUN 20 10/23/2015   CO2 26 10/23/2015   TSH 6.676* 10/17/2015   INR 1.35 10/20/2015   HGBA1C 7.1* 10/19/2015   Remains on vent Sedated Feeding tube still in stomach, current film has not advanced more, repeat in am Was to get blood today   Delight OvensEdward B Marshon Bangs MD  Beeper [phone] Office [phone] 10/23/2015 7:45 PM

## 2015-10-23 NOTE — Progress Notes (Signed)
3 Days Post-Op Procedure(s) (LRB): REDO CORONARY ARTERY BYPASS GRAFTING (CABG), TIMES THREE, USING LEFT GREATER SAPHENOUS VEIN HARVESTED ENDOSCOPICALLY AND CRYO VEIN, WITH CORONARY ENDARTERECTOMY (N/A) TRANSESOPHAGEAL ECHOCARDIOGRAM (TEE) (N/A) ENDARTERECTOMY CAROTID (Right) Subjective: Good cardiac function after redo CABG Remains intubated- delerious  Objective: Vital signs in last 24 hours: Temp:  [97.3 F (36.3 C)-98.8 F (37.1 C)] 98.4 F (36.9 C) (05/01 1915) Pulse Rate:  [78-105] 90 (05/01 1915) Cardiac Rhythm:  [-] Atrial paced (05/01 1800) Resp:  [14] 14 (05/01 1915) BP: (61-142)/(52-78) 101/58 mmHg (05/01 1900) SpO2:  [93 %-100 %] 100 % (05/01 1915) Arterial Line BP: (58-160)/(34-66) 127/55 mmHg (05/01 1915) FiO2 (%):  [50 %] 50 % (05/01 1800) Weight:  [302 lb 7.5 oz (137.2 kg)] 302 lb 7.5 oz (137.2 kg) (05/01 0400)  Hemodynamic parameters for last 24 hours: PAP: (21-39)/(12-26) 29/18 mmHg CO:  [4.5 L/min-8.3 L/min] 5.5 L/min CI:  [1.8 L/min/m2-3.3 L/min/m2] 2 L/min/m2  Intake/Output from previous day: 04/30 0701 - 05/01 0700 In: 3701.9 [I.V.:3271.9; NG/GT:230; IV Piggyback:200] Out: 6670 [Urine:5800; Emesis/NG output:750; Drains:20; Chest Tube:100] Intake/Output this shift:         Exam    General- sedated on vent- nonpurposeful movement   Lungs- clear without rales, wheezes   Cor- regular rate and rhythm, no murmur , gallop   Abdomen- soft, non-tender   Extremities - warm, non-tender, minimal edema      Lab Results:  Recent Labs  10/22/15 0455 10/23/15 0410  WBC 7.8 8.2  HGB 7.2* 7.7*  HCT 20.9* 22.5*  PLT 87* 108*   BMET:  Recent Labs  10/22/15 0455 10/23/15 0410  NA 138 136  K 3.9 3.7  CL 106 100*  CO2 25 26  GLUCOSE 160* 159*  BUN 14 20  CREATININE 1.31* 1.45*  CALCIUM 7.8* 7.6*    PT/INR: No results for input(s): LABPROT, INR in the last 72 hours. ABG    Component Value Date/Time   PHART 7.508* 10/23/2015 0404   HCO3 26.9*  10/23/2015 0404   TCO2 28 10/23/2015 0404   ACIDBASEDEF 1.0 10/20/2015 1824   O2SAT 96.0 10/23/2015 0404   CBG (last 3)   Recent Labs  10/23/15 0801 10/23/15 1310 10/23/15 1636  GLUCAP 151* 167* 152*    Assessment/Plan: S/P Procedure(s) (LRB): REDO CORONARY ARTERY BYPASS GRAFTING (CABG), TIMES THREE, USING LEFT GREATER SAPHENOUS VEIN HARVESTED ENDOSCOPICALLY AND CRYO VEIN, WITH CORONARY ENDARTERECTOMY (N/A) TRANSESOPHAGEAL ECHOCARDIOGRAM (TEE) (N/A) ENDARTERECTOMY CAROTID (Right) Mobilize vent per CCM  Start TF   LOS: 7 days    Kathlee Nationseter Van Trigt III 10/23/2015

## 2015-10-23 NOTE — Care Management Important Message (Signed)
Important Message  Patient Details  Name: Tyler DeutscherDwight E Batson MRN: [id number] Date of Birth: February 09, 1946   Medicare Important Message Given:  Yes    Uriel Dowding [name] 10/23/2015, 4:00 PM

## 2015-10-23 NOTE — Progress Notes (Signed)
K+= 3.7 and creat= 1.45 w/ urine o/p > 30cc/hr; TCTS KCL protocol initiated with 10 mEq KCL in 50cc IV x 3, each over one hour.

## 2015-10-24 ENCOUNTER — Inpatient Hospital Stay (HOSPITAL_COMMUNITY): Payer: Medicare Other

## 2015-10-24 DIAGNOSIS — R2243 Localized swelling, mass and lump, lower limb, bilateral: Secondary | ICD-10-CM

## 2015-10-24 LAB — CBC
HCT: 25.1 % — ABNORMAL LOW (ref 39.0–52.0)
Hemoglobin: 8.4 g/dL — ABNORMAL LOW (ref 13.0–17.0)
MCH: 30 pg (ref 26.0–34.0)
MCHC: 33.5 g/dL (ref 30.0–36.0)
MCV: 89.6 fL (ref 78.0–100.0)
Platelets: 155 10*3/uL (ref 150–400)
RBC: 2.8 MIL/uL — ABNORMAL LOW (ref 4.22–5.81)
RDW: 14.4 % (ref 11.5–15.5)
WBC: 7.8 10*3/uL (ref 4.0–10.5)

## 2015-10-24 LAB — TYPE AND SCREEN
ABO/RH(D): O NEG
Antibody Screen: NEGATIVE
Unit division: 0

## 2015-10-24 LAB — POCT I-STAT 3, ART BLOOD GAS (G3+)
Acid-Base Excess: 5 mmol/L — ABNORMAL HIGH (ref 0.0–2.0)
BICARBONATE: 29.9 meq/L — AB (ref 20.0–24.0)
O2 Saturation: 96 %
PCO2 ART: 43.6 mmHg (ref 35.0–45.0)
PO2 ART: 82 mmHg (ref 80.0–100.0)
Patient temperature: 37.4
TCO2: 31 mmol/L (ref 0–100)
pH, Arterial: 7.446 (ref 7.350–7.450)

## 2015-10-24 LAB — GLUCOSE, CAPILLARY
GLUCOSE-CAPILLARY: 122 mg/dL — AB (ref 65–99)
GLUCOSE-CAPILLARY: 139 mg/dL — AB (ref 65–99)
GLUCOSE-CAPILLARY: 146 mg/dL — AB (ref 65–99)
GLUCOSE-CAPILLARY: 172 mg/dL — AB (ref 65–99)
Glucose-Capillary: 109 mg/dL — ABNORMAL HIGH (ref 65–99)
Glucose-Capillary: 137 mg/dL — ABNORMAL HIGH (ref 65–99)
Glucose-Capillary: 182 mg/dL — ABNORMAL HIGH (ref 65–99)

## 2015-10-24 LAB — POCT I-STAT, CHEM 8
BUN: 27 mg/dL — ABNORMAL HIGH (ref 6–20)
CALCIUM ION: 1.06 mmol/L — AB (ref 1.13–1.30)
Chloride: 96 mmol/L — ABNORMAL LOW (ref 101–111)
Creatinine, Ser: 1.7 mg/dL — ABNORMAL HIGH (ref 0.61–1.24)
Glucose, Bld: 175 mg/dL — ABNORMAL HIGH (ref 65–99)
HEMATOCRIT: 24 % — AB (ref 39.0–52.0)
HEMOGLOBIN: 8.2 g/dL — AB (ref 13.0–17.0)
Potassium: 3.4 mmol/L — ABNORMAL LOW (ref 3.5–5.1)
SODIUM: 135 mmol/L (ref 135–145)
TCO2: 26 mmol/L (ref 0–100)

## 2015-10-24 LAB — COMPREHENSIVE METABOLIC PANEL
ALT: 17 U/L (ref 17–63)
AST: 17 U/L (ref 15–41)
Albumin: 2.7 g/dL — ABNORMAL LOW (ref 3.5–5.0)
Alkaline Phosphatase: 43 U/L (ref 38–126)
Anion gap: 12 (ref 5–15)
BUN: 25 mg/dL — ABNORMAL HIGH (ref 6–20)
CO2: 26 mmol/L (ref 22–32)
Calcium: 7.6 mg/dL — ABNORMAL LOW (ref 8.9–10.3)
Chloride: 98 mmol/L — ABNORMAL LOW (ref 101–111)
Creatinine, Ser: 1.59 mg/dL — ABNORMAL HIGH (ref 0.61–1.24)
GFR calc Af Amer: 49 mL/min — ABNORMAL LOW (ref >60)
GFR calc non Af Amer: 42 mL/min — ABNORMAL LOW (ref >60)
Glucose, Bld: 117 mg/dL — ABNORMAL HIGH (ref 65–99)
Potassium: 3.4 mmol/L — ABNORMAL LOW (ref 3.5–5.1)
Sodium: 136 mmol/L (ref 135–145)
Total Bilirubin: 0.7 mg/dL (ref 0.3–1.2)
Total Protein: 5.6 g/dL — ABNORMAL LOW (ref 6.5–8.1)

## 2015-10-24 MED ORDER — POTASSIUM CHLORIDE 20 MEQ/15ML (10%) PO SOLN
20.0000 meq | Freq: Once | ORAL | Status: AC
  Administered 2015-10-24: 20 meq
  Filled 2015-10-24: qty 15

## 2015-10-24 MED ORDER — INSULIN DETEMIR 100 UNIT/ML ~~LOC~~ SOLN
45.0000 [IU] | Freq: Every day | SUBCUTANEOUS | Status: DC
  Administered 2015-10-24: 45 [IU] via SUBCUTANEOUS
  Filled 2015-10-24 (×2): qty 0.45

## 2015-10-24 MED ORDER — POTASSIUM CHLORIDE 10 MEQ/50ML IV SOLN
10.0000 meq | INTRAVENOUS | Status: AC
  Administered 2015-10-24 (×3): 10 meq via INTRAVENOUS
  Filled 2015-10-24: qty 50

## 2015-10-24 MED ORDER — POTASSIUM CHLORIDE 10 MEQ/50ML IV SOLN
10.0000 meq | Freq: Once | INTRAVENOUS | Status: AC
  Administered 2015-10-24 (×2): 10 meq via INTRAVENOUS

## 2015-10-24 MED ORDER — POTASSIUM CHLORIDE 10 MEQ/50ML IV SOLN
10.0000 meq | INTRAVENOUS | Status: AC
  Administered 2015-10-24 (×3): 10 meq via INTRAVENOUS

## 2015-10-24 MED ORDER — QUETIAPINE FUMARATE 25 MG PO TABS
150.0000 mg | ORAL_TABLET | Freq: Two times a day (BID) | ORAL | Status: DC
  Administered 2015-10-24 – 2015-10-29 (×12): 150 mg via ORAL
  Filled 2015-10-24 (×13): qty 2

## 2015-10-24 MED ORDER — METOCLOPRAMIDE HCL 5 MG/ML IJ SOLN
10.0000 mg | Freq: Three times a day (TID) | INTRAMUSCULAR | Status: DC
  Administered 2015-10-24 – 2015-10-28 (×12): 10 mg via INTRAVENOUS
  Filled 2015-10-24 (×12): qty 2

## 2015-10-24 MED ORDER — POTASSIUM CHLORIDE 10 MEQ/50ML IV SOLN
10.0000 meq | INTRAVENOUS | Status: AC | PRN
  Administered 2015-10-24 (×3): 10 meq via INTRAVENOUS
  Filled 2015-10-24 (×4): qty 50

## 2015-10-24 MED ORDER — VITAL HIGH PROTEIN PO LIQD
1000.0000 mL | ORAL | Status: DC
  Administered 2015-10-24 (×2)
  Administered 2015-10-24: 1000 mL
  Administered 2015-10-24 – 2015-10-25 (×2)

## 2015-10-24 NOTE — Care Management Note (Signed)
Case Management Note  Patient Details  Name: Tyler Le MRN: [id number] Date of Birth: January 13, 1946  Subjective/Objective:  Pt admitted for Unstable Angina. Plan for CABG on 10-20-15. Pt is from home with wife. Pt has no DME needs at this time.                 Action/Plan: 10/24/2015 Pt is from home with wife.  Pt remains ventilated on sedation and pressors.  CM will continue to monitor for disposition needs  CM will continue to monitor for disposition needs.   Expected Discharge Date:                  Expected Discharge Plan:  Home w Home Health Services  In-House Referral:  NA  Discharge planning Services  CM Consult  Post Acute Care Choice:    Choice offered to:     DME Arranged:    DME Agency:     HH Arranged:    HH Agency:     Status of Service:  In process, will continue to follow  Medicare Important Message Given:  Yes Date Medicare IM Given:    Medicare IM give by:    Date Additional Medicare IM Given:    Additional Medicare Important Message give by:     If discussed at Long Length of Stay Meetings, dates discussed:    Additional Comments:  Cherylann ParrClaxton, [name], RN 10/24/2015, 10:33 AM

## 2015-10-24 NOTE — Progress Notes (Addendum)
K+= 3.4 and Creatine = 1.59 with urine output > 60 mL/hr; TCTS KCL protocol initiated with 10 mEq KCL in 50 mL IV x 3, each over one hour.

## 2015-10-24 NOTE — Progress Notes (Signed)
*  PRELIMINARY RESULTS* Vascular Ultrasound Lower extremity venous duplex has been completed.  Preliminary findings: No evidence of DVT in visualized veins or baker's cyst.   Farrel DemarkJill Eunice, RDMS, RVT  10/24/2015, 3:29 PM

## 2015-10-24 NOTE — Progress Notes (Signed)
4 Days Post-Op Procedure(s) (LRB): REDO CORONARY ARTERY BYPASS GRAFTING (CABG), TIMES THREE, USING LEFT GREATER SAPHENOUS VEIN HARVESTED ENDOSCOPICALLY AND CRYO VEIN, WITH CORONARY ENDARTERECTOMY (N/A) TRANSESOPHAGEAL ECHOCARDIOGRAM (TEE) (N/A) ENDARTERECTOMY CAROTID (Right) Subjective: Redo CABG, R CEA More purposeful responses today Severe COPD with low DLCO remains on vent high Fio2 - may need trach if no progress in next several days TF now in position-m start Vital 30cc/hr Objective: Vital signs in last 24 hours: Temp:  [97.3 F (36.3 C)-99.7 F (37.6 C)] 99.3 F (37.4 C) (05/02 0800) Pulse Rate:  [90-113] 95 (05/02 0800) Cardiac Rhythm:  [-] Atrial paced (05/02 0800) Resp:  [10-21] 14 (05/02 0800) BP: (88-134)/(49-73) 108/56 mmHg (05/02 0800) SpO2:  [74 %-100 %] 90 % (05/02 0800) Arterial Line BP: (64-172)/(37-66) 127/48 mmHg (05/02 0800) FiO2 (%):  [50 %-100 %] 80 % (05/02 0806) Weight:  [302 lb 0.5 oz (137 kg)] 302 lb 0.5 oz (137 kg) (05/02 0245)  Hemodynamic parameters for last 24 hours: PAP: (21-52)/(13-34) 34/20 mmHg CO:  [4.5 L/min-5.5 L/min] 5.5 L/min CI:  [1.8 L/min/m2-2.1 L/min/m2] 2.1 L/min/m2  Intake/Output from previous day: 05/01 0701 - 05/02 0700 In: 3677.3 [I.V.:2987.3; Blood:330; NG/GT:210; IV Piggyback:150] Out: 9604 [VWUJW:[id number] [Urine:4445; Emesis/NG output:650] Intake/Output this shift: Total I/O In: 322.2 [I.V.:272.2; IV Piggyback:50] Out: 75 [Urine:75]       Exam    General- sedated on vent   Lungs- clear without rales, wheezes   Cor- regular rate and rhythm, no murmur , gallop   Abdomen- soft, non-tender   Extremities - warm, non-tender, minimal edema   Neuro- , no focal weakness moves all extremities   Lab Results:  Recent Labs  10/23/15 0410 10/24/15 0425  WBC 8.2 7.8  HGB 7.7* 8.4*  HCT 22.5* 25.1*  PLT 108* 155   BMET:  Recent Labs  10/23/15 0410 10/24/15 0425  NA 136 136  K 3.7 3.4*  CL 100* 98*  CO2 26 26  GLUCOSE 159* 117*   BUN 20 25*  CREATININE 1.45* 1.59*  CALCIUM 7.6* 7.6*    PT/INR: No results for input(s): LABPROT, INR in the last 72 hours. ABG    Component Value Date/Time   PHART 7.446 10/24/2015 0432   HCO3 29.9* 10/24/2015 0432   TCO2 31 10/24/2015 0432   ACIDBASEDEF 1.0 10/20/2015 1824   O2SAT 96.0 10/24/2015 0432   CBG (last 3)   Recent Labs  10/23/15 2002 10/23/15 2358 10/24/15 0434  GLUCAP 144* 109* 122*    Assessment/Plan: S/P Procedure(s) (LRB): REDO CORONARY ARTERY BYPASS GRAFTING (CABG), TIMES THREE, USING LEFT GREATER SAPHENOUS VEIN HARVESTED ENDOSCOPICALLY AND CRYO VEIN, WITH CORONARY ENDARTERECTOMY (N/A) TRANSESOPHAGEAL ECHOCARDIOGRAM (TEE) (N/A) ENDARTERECTOMY CAROTID (Right) Sputum culture TF  vent per CCM   LOS: 8 days    Kathlee Nationseter Van Trigt III 10/24/2015

## 2015-10-24 NOTE — Progress Notes (Signed)
Patient ID: Tyler Le, male   DOB: 01/05/1946, 70 y.o.   MRN: [id number]  SICU Evening Rounds:  Hemodynamically very labile on milrinone 0.125, dop 2.5 and neo 90.   Lasix drip at 8 with adequate urine output. Replacing K+.   Remains on vent.  Tube feeds started at 30. Glucose rising to 180 tonight. Starting Levemir tonight. May need bid schedule.  BMET    Component Value Date/Time   NA 135 10/24/2015 1521   K 3.4* 10/24/2015 1521   CL 96* 10/24/2015 1521   CO2 26 10/24/2015 0425   GLUCOSE 175* 10/24/2015 1521   BUN 27* 10/24/2015 1521   CREATININE 1.70* 10/24/2015 1521   CREATININE 1.40* 10/11/2015 1637   CALCIUM 7.6* 10/24/2015 0425   GFRNONAA 42* 10/24/2015 0425   GFRAA 49* 10/24/2015 0425    CBC    Component Value Date/Time   WBC 7.8 10/24/2015 0425   RBC 2.80* 10/24/2015 0425   HGB 8.2* 10/24/2015 1521   HCT 24.0* 10/24/2015 1521   PLT 155 10/24/2015 0425   MCV 89.6 10/24/2015 0425   MCH 30.0 10/24/2015 0425   MCHC 33.5 10/24/2015 0425   RDW 14.4 10/24/2015 0425   LYMPHSABS 1700 10/11/2015 1637   MONOABS 400 10/11/2015 1637   EOSABS 150 10/11/2015 1637   BASOSABS 0 10/11/2015 1637

## 2015-10-24 NOTE — Progress Notes (Signed)
Nutrition Follow-up  DOCUMENTATION CODES:   Obesity unspecified  INTERVENTION:    As medically appropriate, recommend increasing Vital High Protein formula by 10 ml every 4 hours to goal rate of 70 ml/hr   Add Prostat liquid protein 30 ml TID  Total TF regimen to provide 1980 kcals, 192 gm protein, 1404 ml of free water daily  NUTRITION DIAGNOSIS:   Inadequate oral intake related to inability to eat as evidenced by NPO status   Ongoing  GOAL:   Provide needs based on ASPEN/SCCM guidelines  Progressing  MONITOR:   TF tolerance, Vent status, Labs, Weight trends, I & O's  ASSESSMENT:   Tyler Le is a 70 y.o. male, who presents for evaluation of bilateral internal carotid artery stenosis. He is scheduled for a redo CABG on Friday with Dr. Donata ClayVan Trigt for severe recurrent three vessel CAD. He previously had a CABG in 2001. He underwent cardiac cath on 10/16/15 due to unstable angina and was admitted afterwards.   4 Days Post-Op Procedure(s): REDO CABG TRANSESOPHAGEAL ECHOCARDIOGRAM (TEE) (N/A) ENDARTERECTOMY CAROTID (Right)  Pt remains intubated.  CORTRAK small bore feeding tube placed 5/1. Team adjusted tube this AM.  Tip at duodenal bulb at this time.  RN will restart tube feeding once confirmed.  Current orders for Vital High Protein at 30 ml/hr -- will provide 720 kcals, 63 gm protein, 602 ml of free water daily.  Labs reviewed; K 3.4, Cl 98, BUN 25, creatinine 1.59, Ca 7.6, GFR 42.  Meds reviewed.  My student, Beryle QuantMeredith Andrews spoke with Dr. Donata ClayVan Trigt via telephone regarding increasing TF regimen.  Diet Order:  Diet NPO time specified  Skin:  Reviewed, no issues  Last BM:  10/18/15  Height:   Ht Readings from Last 1 Encounters:  10/16/15 6\' 4"  (1.93 m)    Weight:   Wt Readings from Last 1 Encounters:  10/24/15 302 lb 0.5 oz (137 kg)    Ideal Body Weight:  91.8 kg  BMI:  Body mass index is 36.78 kg/(m^2).  Estimated Nutritional Needs:    Kcal:  [id number]  Protein:  >/= 180 gm  Fluid:  per MD  EDUCATION NEEDS:   No education needs identified at this time  Beryle QuantMeredith Andrews, MS [facility] Dietetic Intern Pager [phone]  Maureen ChattersKatie Bryston Colocho, RD, LDN Pager #: [phone] After-Hours Pager #: [phone]

## 2015-10-24 NOTE — Progress Notes (Signed)
Name: Tyler Le MRN: [id number] DOB: 07/13/1945    ADMISSION DATE:  10/16/2015 CONSULTATION DATE:  10/18/15  REFERRING MD : Donata Clay, MD  CHIEF COMPLAINT:  Hypercapnia  BRIEF PATIENT DESCRIPTION: 70 yr old obese, needs redo CABG, called for acute on chronic resp acidosis   SUBJECTIVE:  RN reports pt having episodes of desaturation into the high 80's, FiO2 increased.  More calm today  VITAL SIGNS: Temp:  [97.3 F (36.3 C)-99.7 F (37.6 C)] 99.3 F (37.4 C) (05/02 0800) Pulse Rate:  [90-113] 95 (05/02 0800) Resp:  [10-21] 14 (05/02 0800) BP: (88-134)/(49-73) 108/56 mmHg (05/02 0800) SpO2:  [74 %-100 %] 90 % (05/02 0800) Arterial Line BP: (64-172)/(37-66) 127/48 mmHg (05/02 0800) FiO2 (%):  [50 %-100 %] 80 % (05/02 0806) Weight:  [302 lb 0.5 oz (137 kg)] 302 lb 0.5 oz (137 kg) (05/02 0245)  PHYSICAL EXAMINATION: General:  Morbidly obese male, sedate on vent, no acute distress HENT: NCAT, ETT in place, staples to right neck  PULM: CTA B vents supported breaths, diminished L lower CV: midline scar dressing in place, s1s2 regular, distant tones, paced rhythm on monitor  GI: BS+, soft Derm: no rash Neuro: heavily sedated on vent   Recent Labs Lab 10/22/15 0455 10/23/15 0410 10/24/15 0425  NA 138 136 136  K 3.9 3.7 3.4*  CL 106 100* 98*  CO2 25 26 26   BUN 14 20 25*  CREATININE 1.31* 1.45* 1.59*  GLUCOSE 160* 159* 117*    Recent Labs Lab 10/22/15 0455 10/23/15 0410 10/24/15 0425  HGB 7.2* 7.7* 8.4*  HCT 20.9* 22.5* 25.1*  WBC 7.8 8.2 7.8  PLT 87* 108* 155   Dg Chest Port 1 View  10/24/2015  CLINICAL DATA:  Post CABG x 4 EXAM: PORTABLE CHEST 1 VIEW COMPARISON:  Portable exam 0625 hours compared to 10/23/2015 FINDINGS: Tip of endotracheal tube projects 7.6 cm above carina. Nasogastric extends into stomach. LEFT subclavian Swan-Ganz catheter with tip projecting over bifurcation of main pulmonary artery. Enlargement of cardiac silhouette post CABG. Decreased  lung volumes with bibasilar atelectasis. No gross infiltrate, pleural effusion, or pneumothorax. IMPRESSION: Low lung volumes with bibasilar atelectasis. Electronically Signed   By: Ulyses Southward M.D.   On: 10/24/2015 07:36   Dg Chest Port 1 View  10/23/2015  CLINICAL DATA:  Atelectasis EXAM: PORTABLE CHEST 1 VIEW COMPARISON:  10/22/2015 FINDINGS: Support devices are stable. Cardiomegaly. Prior CABG. Improving aeration in the right lung base. Continued left basilar atelectasis with small left effusion. No pneumothorax. IMPRESSION: Stable support devices. Improving aeration at the right lung base with continued left base atelectasis and small left effusion. Electronically Signed   By: Charlett Nose M.D.   On: 10/23/2015 07:50   Dg Abd Portable 1v  10/24/2015  CLINICAL DATA:  Feeding tube placement EXAM: PORTABLE ABDOMEN - 1 VIEW COMPARISON:  Portable exam 0629 hours compared to 10/23/2015 FINDINGS: Tip of nasogastric tube projects over pylorus. Tip of feeding tube is coiled and projects over second portion of duodenum. Epicardial pacing wires noted. Bowel gas pattern normal. No acute osseous findings. IMPRESSION: Feeding tube is coiled and projects over the second portion of the duodenum. Electronically Signed   By: Ulyses Southward M.D.   On: 10/24/2015 07:37   Dg Abd Portable 1v  10/23/2015  CLINICAL DATA:  Feeding tube adjustment. EXAM: PORTABLE ABDOMEN - 1 VIEW 4:19 p.m. COMPARISON:  10/23/2015 at 10:43 a.m. FINDINGS: Feeding tube tip and NG tube tip remain in the region  of the pylorus or duodenal bulb, essentially unchanged. Bowel gas pattern is normal. Slight atelectasis at the lung bases. IMPRESSION: No change in the positions of the NG tube and feeding to. Tips are in the region of the duodenal bulb. Electronically Signed   By: Francene Boyers M.D.   On: 10/23/2015 16:39   Dg Abd Portable 1v  10/23/2015  CLINICAL DATA:  NG tube placement. EXAM: PORTABLE ABDOMEN - 1 VIEW COMPARISON:  Scout image for CT scan  dated 08/03/2014 FINDINGS: The feeding tube tip and ET tube tip are both in the region of the pylorus or duodenal bulb. Both are in good position. Bowel gas pattern is normal. No acute bone abnormality. Degenerative changes throughout the lumbar spine with previous laminectomies at L4. IMPRESSION: NG tube and feeding tube tips are both in the region of the pylorus or duodenal bulb. Electronically Signed   By: Francene Boyers M.D.   On: 10/23/2015 11:19    STUDIES:  PFT 4/25 >> restrictive lung dz, low DLCO diffusion  CULTURES: Sputum 4/27 >>   ANTIBIOTICS: Cefuroxime 4/28 >> 4/30  SIGNIFICANT EVENTS: 4/28 CABG and CEA  LINES/TUBES: 4/28  midine R arm >>  4/28  L subclavian introducer >  4/28  R neck drain >> 4/30 4/28  mediastinal chest tubes >> 4/30 5/01  PICC >>  ASSESSMENT / PLAN:   DISCUSSION: 70 y/o male with obesity hypoventilation syndrome who underwent a redo CABG and CEA on 4/28. Remains on vent post-operatively - anxiety, bradycardia/hypotension as barriers to extubation.   ASSESSMENT / PLAN:  PULMONARY A: Vent dependence Chronic respiratory failure with hypercapnea Obesity hypoventilation syndrome P:   Continue PRVC until mental status / anxiety improves Will need nocturnal BIPAP post extubation Intermittent CXR Daily SBT / WUA   NEUROLOGIC A:   Acute encephalopathy - severe agitation, ddx broad including metabolic encephalopathy given baseline issues and medications given here, suspect baseline mental illness given multiple psychoactive meds at home including seroquel Chronic pain  P:   RASS goal: -1  Continue fentanyl gtt Seroquel to 150 BID and follow QTc (461 on 4/30) interval closely, titrate as able PAD protocol Continue cymbalta, buspar, remeron, & seroquel  CARDIOVASCULAR A:  S/p CABG and CEA P:  Per vascular and TCTS Monitor hemodynamics  RENAL A:   AKI Hypokalemia P:   Continue vasopressors and fluids as ordered Monitor BMET and  UOP Lasix gtt per TCTS Replace electrolytes as needed - kcl 20 mEq x1  GASTROINTESTINAL A:   Morbid Obesity  P:   Cortrak placed 5/1, TF at 30 ml/hr NGT clampled PPI Nutrition per surgery  HEMATOLOGIC A:   Anemia, no bleeding P:  Transfusion per usual ICU guidelines  INFECTIOUS A:   Fever post op, normal WBC so likely atelectasis.  No infiltrate on CXR 5/2 P:   Monitor  Completed cefuroxime per surgery protocol Monitor sputum culture  ENDOCRINE A:   Hyperglycemia P:   Monitor off insulin gtt Continue home liraglutide, SSI    FAMILY  - Updates: updated bedside 5/1.  No family available 5/2.  The patients mother passed away today (he is unaware).  They are arranging funeral plans.    Canary Brim, NP-C [facility] Pgr: [phone] or if no answer [phone] 10/24/2015, 8:10 AM

## 2015-10-25 ENCOUNTER — Inpatient Hospital Stay (HOSPITAL_COMMUNITY): Payer: Medicare Other

## 2015-10-25 DIAGNOSIS — J9622 Acute and chronic respiratory failure with hypercapnia: Secondary | ICD-10-CM

## 2015-10-25 LAB — POCT I-STAT, CHEM 8
BUN: 34 mg/dL — ABNORMAL HIGH (ref 6–20)
Calcium, Ion: 1.07 mmol/L — ABNORMAL LOW (ref 1.13–1.30)
Chloride: 96 mmol/L — ABNORMAL LOW (ref 101–111)
Creatinine, Ser: 1.5 mg/dL — ABNORMAL HIGH (ref 0.61–1.24)
Glucose, Bld: 197 mg/dL — ABNORMAL HIGH (ref 65–99)
HCT: 28 % — ABNORMAL LOW (ref 39.0–52.0)
Hemoglobin: 9.5 g/dL — ABNORMAL LOW (ref 13.0–17.0)
Potassium: 3.8 mmol/L (ref 3.5–5.1)
Sodium: 137 mmol/L (ref 135–145)
TCO2: 27 mmol/L (ref 0–100)

## 2015-10-25 LAB — CBC
HCT: 23.6 % — ABNORMAL LOW (ref 39.0–52.0)
Hemoglobin: 7.7 g/dL — ABNORMAL LOW (ref 13.0–17.0)
MCH: 30.2 pg (ref 26.0–34.0)
MCHC: 32.6 g/dL (ref 30.0–36.0)
MCV: 92.5 fL (ref 78.0–100.0)
Platelets: 124 10*3/uL — ABNORMAL LOW (ref 150–400)
RBC: 2.55 MIL/uL — ABNORMAL LOW (ref 4.22–5.81)
RDW: 14.7 % (ref 11.5–15.5)
WBC: 4.5 10*3/uL (ref 4.0–10.5)

## 2015-10-25 LAB — POCT I-STAT 3, ART BLOOD GAS (G3+)
ACID-BASE EXCESS: 3 mmol/L — AB (ref 0.0–2.0)
BICARBONATE: 28.1 meq/L — AB (ref 20.0–24.0)
O2 Saturation: 94 %
TCO2: 30 mmol/L (ref 0–100)
pCO2 arterial: 48.1 mmHg — ABNORMAL HIGH (ref 35.0–45.0)
pH, Arterial: 7.377 (ref 7.350–7.450)
pO2, Arterial: 77 mmHg — ABNORMAL LOW (ref 80.0–100.0)

## 2015-10-25 LAB — GLUCOSE, CAPILLARY
GLUCOSE-CAPILLARY: 132 mg/dL — AB (ref 65–99)
Glucose-Capillary: 149 mg/dL — ABNORMAL HIGH (ref 65–99)
Glucose-Capillary: 184 mg/dL — ABNORMAL HIGH (ref 65–99)
Glucose-Capillary: 197 mg/dL — ABNORMAL HIGH (ref 65–99)
Glucose-Capillary: 201 mg/dL — ABNORMAL HIGH (ref 65–99)

## 2015-10-25 LAB — COMPREHENSIVE METABOLIC PANEL
ALT: 22 U/L (ref 17–63)
AST: 28 U/L (ref 15–41)
Albumin: 2.5 g/dL — ABNORMAL LOW (ref 3.5–5.0)
Alkaline Phosphatase: 55 U/L (ref 38–126)
Anion gap: 10 (ref 5–15)
BUN: 28 mg/dL — ABNORMAL HIGH (ref 6–20)
CO2: 26 mmol/L (ref 22–32)
Calcium: 7.6 mg/dL — ABNORMAL LOW (ref 8.9–10.3)
Chloride: 99 mmol/L — ABNORMAL LOW (ref 101–111)
Creatinine, Ser: 1.59 mg/dL — ABNORMAL HIGH (ref 0.61–1.24)
GFR calc Af Amer: 49 mL/min — ABNORMAL LOW (ref >60)
GFR calc non Af Amer: 42 mL/min — ABNORMAL LOW (ref >60)
Glucose, Bld: 164 mg/dL — ABNORMAL HIGH (ref 65–99)
Potassium: 3.8 mmol/L (ref 3.5–5.1)
Sodium: 135 mmol/L (ref 135–145)
Total Bilirubin: 0.7 mg/dL (ref 0.3–1.2)
Total Protein: 5.5 g/dL — ABNORMAL LOW (ref 6.5–8.1)

## 2015-10-25 MED ORDER — POTASSIUM CHLORIDE 10 MEQ/50ML IV SOLN
10.0000 meq | INTRAVENOUS | Status: AC
  Administered 2015-10-25 (×2): 10 meq via INTRAVENOUS

## 2015-10-25 MED ORDER — POTASSIUM CHLORIDE 10 MEQ/50ML IV SOLN: 10.0000 meq | INTRAVENOUS | Status: DC

## 2015-10-25 MED ORDER — POTASSIUM CHLORIDE 10 MEQ/50ML IV SOLN
10.0000 meq | INTRAVENOUS | Status: AC
  Administered 2015-10-25 (×3): 10 meq via INTRAVENOUS

## 2015-10-25 MED ORDER — INSULIN DETEMIR 100 UNIT/ML ~~LOC~~ SOLN
50.0000 [IU] | Freq: Every day | SUBCUTANEOUS | Status: AC
  Administered 2015-10-25: 50 [IU] via SUBCUTANEOUS
  Filled 2015-10-25 (×2): qty 0.5

## 2015-10-25 MED ORDER — VITAL HIGH PROTEIN PO LIQD
1000.0000 mL | ORAL | Status: DC
  Administered 2015-10-25 – 2015-10-26 (×2): 1000 mL
  Administered 2015-10-26: 08:00:00

## 2015-10-25 MED ORDER — SODIUM CHLORIDE 0.9% FLUSH
10.0000 mL | INTRAVENOUS | Status: DC | PRN
Start: 2015-10-25 — End: 2015-11-02

## 2015-10-25 MED ORDER — SODIUM CHLORIDE 0.9% FLUSH
10.0000 mL | Freq: Two times a day (BID) | INTRAVENOUS | Status: DC
  Administered 2015-10-25 – 2015-11-08 (×22): 10 mL
  Administered 2015-11-10: 30 mL

## 2015-10-25 MED ORDER — INSULIN DETEMIR 100 UNIT/ML ~~LOC~~ SOLN
30.0000 [IU] | Freq: Two times a day (BID) | SUBCUTANEOUS | Status: DC
  Administered 2015-10-26 – 2015-10-30 (×8): 30 [IU] via SUBCUTANEOUS
  Filled 2015-10-25 (×11): qty 0.3

## 2015-10-25 NOTE — Significant Event (Signed)
200mL Fentanyl wasted in sink and witnessed by Aline AugustNicole Frahm, RN. 50mL Versed wasted in sink and witnessed by Laverna PeaceShanna Hintz, RN.  Tyler Le, Sheila Ocasio E

## 2015-10-25 NOTE — Progress Notes (Signed)
Patient ID: Tyler DeutscherDwight E Le, male   DOB: 25-Sep-1945, 70 y.o.   MRN: [id number] Vascular Surgery Progress Note  Subjective: 5 days post right carotid endarterectomy and combined redo coronary artery bypass grafting. Patient with severe COPD remains sedated on ventilator. According to nursing staff has awakened enough at times to notice movement of upper and lower extremities but not purposeful. Will likely need tracheostomy.  Objective:  Filed Vitals:   10/25/15 0645 10/25/15 0700  BP:  108/46  Pulse: 80 84  Temp: 99.1 F (37.3 C) 99.1 F (37.3 C)  Resp: 19 14    Gen. sedated on ventilator Neurologic-does not follow commands-sedated Right neck incision healing nicely with no evidence of hematoma   Labs:  Recent Labs Lab 10/24/15 0425 10/24/15 1521 10/25/15 0440  CREATININE 1.59* 1.70* 1.59*    Recent Labs Lab 10/23/15 0410 10/24/15 0425 10/24/15 1521 10/25/15 0440  NA 136 136 135 135  K 3.7 3.4* 3.4* 3.8  CL 100* 98* 96* 99*  CO2 26 26  --  26  BUN 20 25* 27* 28*  CREATININE 1.45* 1.59* 1.70* 1.59*  GLUCOSE 159* 117* 175* 164*  CALCIUM 7.6* 7.6*  --  7.6*    Recent Labs Lab 10/23/15 0410 10/24/15 0425 10/24/15 1521 10/25/15 0440  WBC 8.2 7.8  --  4.5  HGB 7.7* 8.4* 8.2* 7.7*  HCT 22.5* 25.1* 24.0* 23.6*  PLT 108* 155  --  124*    Recent Labs Lab 10/20/15 1810  INR 1.35    I/O last 3 completed shifts: In: 6280.2 [I.V.:4232.7; Blood:330; NG/GT:1217.5; IV Piggyback:500] Out: 3905 [Urine:3705; Emesis/NG output:200]  Imaging: Dg Chest Port 1 View  10/25/2015  CLINICAL DATA:  CABG. EXAM: PORTABLE CHEST 1 VIEW COMPARISON:  10/24/2015. FINDINGS: Endotracheal tube, feeding tube, Swan-Ganz catheter in stable position . Low lung volumes with bibasilar atelectasis and/or infiltrates/edema. Prior CABG. Cardiomegaly with mild pulmonary venous congestion. No pneumothorax. Surgical staples right neck. IMPRESSION: 1. Lines and tubes in stable position. 2. Prior CABG.  Cardiomegaly with mild pulmonary vascular prominence. Progressive bibasilar atelectasis and infiltrates/edema. Small bilateral pleural effusions. A component of congestive heart failure may be present . Electronically Signed   By: Maisie Fushomas  Register   On: 10/25/2015 08:06   Dg Chest Port 1 View  10/24/2015  CLINICAL DATA:  Post CABG x 4 EXAM: PORTABLE CHEST 1 VIEW COMPARISON:  Portable exam 0625 hours compared to 10/23/2015 FINDINGS: Tip of endotracheal tube projects 7.6 cm above carina. Nasogastric extends into stomach. LEFT subclavian Swan-Ganz catheter with tip projecting over bifurcation of main pulmonary artery. Enlargement of cardiac silhouette post CABG. Decreased lung volumes with bibasilar atelectasis. No gross infiltrate, pleural effusion, or pneumothorax. IMPRESSION: Low lung volumes with bibasilar atelectasis. Electronically Signed   By: Ulyses SouthwardMark  Boles M.D.   On: 10/24/2015 07:36   Dg Abd Portable 1v  10/24/2015  CLINICAL DATA:  Feeding tube placement. EXAM: PORTABLE ABDOMEN - 1 VIEW COMPARISON:  Earlier today. FINDINGS: Followup CT intestinal gas. Feeding tube tip in the duodenal wall. Nasogastric tube tip at the gastric pylorus. Cholecystectomy clips. Thoracic and lumbar spine degenerative changes. IMPRESSION: Feeding tube tip in the duodenal bulb.  No acute abnormality. Electronically Signed   By: Beckie SaltsSteven  Reid M.D.   On: 10/24/2015 11:54   Dg Abd Portable 1v  10/24/2015  CLINICAL DATA:  Feeding tube placement EXAM: PORTABLE ABDOMEN - 1 VIEW COMPARISON:  Portable exam 0629 hours compared to 10/23/2015 FINDINGS: Tip of nasogastric tube projects over pylorus. Tip of feeding  tube is coiled and projects over second portion of duodenum. Epicardial pacing wires noted. Bowel gas pattern normal. No acute osseous findings. IMPRESSION: Feeding tube is coiled and projects over the second portion of the duodenum. Electronically Signed   By: Ulyses Southward M.D.   On: 10/24/2015 07:37   Dg Abd Portable  1v  10/23/2015  CLINICAL DATA:  Feeding tube adjustment. EXAM: PORTABLE ABDOMEN - 1 VIEW 4:19 p.m. COMPARISON:  10/23/2015 at 10:43 a.m. FINDINGS: Feeding tube tip and NG tube tip remain in the region of the pylorus or duodenal bulb, essentially unchanged. Bowel gas pattern is normal. Slight atelectasis at the lung bases. IMPRESSION: No change in the positions of the NG tube and feeding to. Tips are in the region of the duodenal bulb. Electronically Signed   By: Francene Boyers M.D.   On: 10/23/2015 16:39   Dg Abd Portable 1v  10/23/2015  CLINICAL DATA:  NG tube placement. EXAM: PORTABLE ABDOMEN - 1 VIEW COMPARISON:  Scout image for CT scan dated 08/03/2014 FINDINGS: The feeding tube tip and ET tube tip are both in the region of the pylorus or duodenal bulb. Both are in good position. Bowel gas pattern is normal. No acute bone abnormality. Degenerative changes throughout the lumbar spine with previous laminectomies at L4. IMPRESSION: NG tube and feeding tube tips are both in the region of the pylorus or duodenal bulb. Electronically Signed   By: Francene Boyers M.D.   On: 10/23/2015 11:19    Assessment/Plan:  POD #5  LOS: 9 days  s/p Procedure(s): REDO CORONARY ARTERY BYPASS GRAFTING (CABG), TIMES THREE, USING LEFT GREATER SAPHENOUS VEIN HARVESTED ENDOSCOPICALLY AND CRYO VEIN, WITH CORONARY ENDARTERECTOMY TRANSESOPHAGEAL ECHOCARDIOGRAM (TEE) ENDARTERECTOMY CAROTID  We'll continue to follow along from a vascular surgery standpoint. Unable to assess neurologic status because of sedation for severe pulmonary disease and agitation. Will likely need tracheostomy for slow weaning from ventilator   [name], MD 10/25/2015 8:11 AM

## 2015-10-25 NOTE — Progress Notes (Signed)
Patient ID: Tyler Le, male   DOB: 1945-12-26, 70 y.o.   MRN: [id number] EVENING ROUNDS NOTE :     [address]       [place] [id number]             [phone]                 5 Days Post-Op Procedure(s) (LRB): REDO CORONARY ARTERY BYPASS GRAFTING (CABG), TIMES THREE, USING LEFT GREATER SAPHENOUS VEIN HARVESTED ENDOSCOPICALLY AND CRYO VEIN, WITH CORONARY ENDARTERECTOMY (N/A) TRANSESOPHAGEAL ECHOCARDIOGRAM (TEE) (N/A) ENDARTERECTOMY CAROTID (Right)  Total Length of Stay:  LOS: 9 days  BP 110/38 mmHg  Pulse 92  Temp(Src) 99.3 F (37.4 C) (Core (Comment))  Resp 13  Ht 6\' 4"  (1.93 m)  Wt 307 lb 8.7 oz (139.5 kg)  BMI 37.45 kg/m2  SpO2 94%  .Intake/Output      05/02 0701 - 05/03 0700 05/03 0701 - 05/04 0700   I.V. (mL/kg) 2803.4 (20.1) 328.1 (2.4)   Blood     NG/GT 1037.5 760   IV Piggyback 450    Total Intake(mL/kg) 4290.9 (30.8) 1088.1 (7.8)   Urine (mL/kg/hr) 2520 (0.8) 995 (0.6)   Emesis/NG output 100 (0) 0 (0)   Total Output 2620 995   Net +1670.9 +93.1          . sodium chloride 10 mL/hr at 10/24/15 1900  . sodium chloride    . sodium chloride 30 mL/hr (10/25/15 1700)  . sodium chloride 10 mL/hr at 10/24/15 2100  . fentaNYL infusion INTRAVENOUS Stopped (10/25/15 1701)  . furosemide (LASIX) infusion 8 mg/hr (10/25/15 1700)  . lactated ringers    . lactated ringers 10 mL/hr at 10/25/15 0700  . midazolam (VERSED) infusion Stopped (10/25/15 1701)  . milrinone 0.125 mcg/kg/min (10/25/15 1700)  . phenylephrine (NEO-SYNEPHRINE) Adult infusion 0 mcg/min (10/25/15 1700)     Lab Results  Component Value Date   WBC 4.5 10/25/2015   HGB 9.5* 10/25/2015   HCT 28.0* 10/25/2015   PLT 124* 10/25/2015   GLUCOSE 197* 10/25/2015   CHOL 130 05/09/2011   TRIG 359* 05/09/2011   HDL 27* 05/09/2011   LDLCALC 31 05/09/2011   ALT 22 10/25/2015   AST 28 10/25/2015   NA 137 10/25/2015   K 3.8 10/25/2015   CL 96* 10/25/2015   CREATININE 1.50* 10/25/2015     BUN 34* 10/25/2015   CO2 26 10/25/2015   TSH 6.676* 10/17/2015   INR 1.35 10/20/2015   HGBA1C 7.1* 10/19/2015   Still on vent o2 demands have decreased Less sedation now and follows commands  Delight OvensEdward B Geo Slone MD  Beeper [phone] Office [phone] 10/25/2015 6:03 PM

## 2015-10-25 NOTE — Progress Notes (Signed)
5 Days Post-Op Procedure(s) (LRB): REDO CORONARY ARTERY BYPASS GRAFTING (CABG), TIMES THREE, USING LEFT GREATER SAPHENOUS VEIN HARVESTED ENDOSCOPICALLY AND CRYO VEIN, WITH CORONARY ENDARTERECTOMY (N/A) TRANSESOPHAGEAL ECHOCARDIOGRAM (TEE) (N/A) ENDARTERECTOMY CAROTID (Right) Subjective:redo CABG with combined right carotid endarterectomy Severe COPD-sleep apnea preop now with ventilator dependence postop Delirium postop but now making purposeful movements Chest x-ray of lung volumes Tracheal aspirate with white cells no growth on culture  Objective: Vital signs in last 24 hours: Temp:  [98.4 F (36.9 C)-99.7 F (37.6 C)] 99.1 F (37.3 C) (05/03 0700) Pulse Rate:  [80-103] 84 (05/03 0700) Cardiac Rhythm:  [-] Normal sinus rhythm (05/03 0600) Resp:  [0-29] 14 (05/03 0700) BP: (92-143)/(43-65) 108/46 mmHg (05/03 0700) SpO2:  [81 %-100 %] 99 % (05/03 0836) Arterial Line BP: (79-208)/(41-95) 166/51 mmHg (05/03 0700) FiO2 (%):  [60 %-100 %] 60 % (05/03 0836) Weight:  [307 lb 8.7 oz (139.5 kg)] 307 lb 8.7 oz (139.5 kg) (05/03 0500)  Hemodynamic parameters for last 24 hours: PAP: (28-42)/(18-34) 34/31 mmHg CO:  [5.1 L/min-6.7 L/min] 5.1 L/min CI:  [2 L/min/m2-2.6 L/min/m2] 2 L/min/m2  Intake/Output from previous day: 05/02 0701 - 05/03 0700 In: 4290.9 [I.V.:2803.4; NG/GT:1037.5; IV Piggyback:450] Out: 2620 [Urine:2520; Emesis/NG output:100] Intake/Output this shift: Total I/O In: -  Out: 425 [Urine:425]  Stable blood pressure Sinus rhythm Extremities edematous  Lab Results:  Recent Labs  10/24/15 0425 10/24/15 1521 10/25/15 0440  WBC 7.8  --  4.5  HGB 8.4* 8.2* 7.7*  HCT 25.1* 24.0* 23.6*  PLT 155  --  124*   BMET:  Recent Labs  10/24/15 0425 10/24/15 1521 10/25/15 0440  NA 136 135 135  K 3.4* 3.4* 3.8  CL 98* 96* 99*  CO2 26  --  26  GLUCOSE 117* 175* 164*  BUN 25* 27* 28*  CREATININE 1.59* 1.70* 1.59*  CALCIUM 7.6*  --  7.6*    PT/INR: No results for  input(s): LABPROT, INR in the last 72 hours. ABG    Component Value Date/Time   PHART 7.377 10/25/2015 0447   HCO3 28.1* 10/25/2015 0447   TCO2 30 10/25/2015 0447   ACIDBASEDEF 1.0 10/20/2015 1824   O2SAT 94.0 10/25/2015 0447   CBG (last 3)   Recent Labs  10/24/15 2346 10/25/15 0420 10/25/15 0843  GLUCAP 132* 149* 184*    Assessment/Plan: S/P Procedure(s) (LRB): REDO CORONARY ARTERY BYPASS GRAFTING (CABG), TIMES THREE, USING LEFT GREATER SAPHENOUS VEIN HARVESTED ENDOSCOPICALLY AND CRYO VEIN, WITH CORONARY ENDARTERECTOMY (N/A) TRANSESOPHAGEAL ECHOCARDIOGRAM (TEE) (N/A) ENDARTERECTOMY CAROTID (Right) Diuresis Diabetes control Remove PA catheter  Every morning central venous saturation   LOS: 9 days    Kathlee Nationseter Van Trigt III 10/25/2015

## 2015-10-25 NOTE — Progress Notes (Signed)
Name: Tyler Le MRN: [id number] DOB: 27-Dec-1945    ADMISSION DATE:  10/16/2015 CONSULTATION DATE:  10/18/15  REFERRING MD : Donata Clay, MD  CHIEF COMPLAINT:  Hypercapnia   SUBJECTIVE:   Fentanyl gtt weaned to 150 mcg/hr, neosynephrine / dopamine off  VITAL SIGNS: Temp:  [98.4 F (36.9 C)-99.7 F (37.6 C)] 99.1 F (37.3 C) (05/03 0700) Pulse Rate:  [80-103] 84 (05/03 0700) Resp:  [0-29] 14 (05/03 0700) BP: (92-143)/(43-65) 108/46 mmHg (05/03 0700) SpO2:  [81 %-100 %] 99 % (05/03 0836) Arterial Line BP: (79-208)/(39-95) 166/51 mmHg (05/03 0700) FiO2 (%):  [60 %-100 %] 60 % (05/03 0836) Weight:  [307 lb 8.7 oz (139.5 kg)] 307 lb 8.7 oz (139.5 kg) (05/03 0500)  PHYSICAL EXAMINATION: General:  Morbidly obese male, sedate on vent, no acute distress HENT: NCAT, ETT in place, staples to right neck  PULM: CTA B vents supported breaths, diminished L lower CV: midline scar dressing in place, s1s2 regular, distant tones, paced rhythm on monitor  GI: BS+, soft Derm: no rash Neuro: sedate on vent, eyes flutter to voice    Recent Labs Lab 10/23/15 0410 10/24/15 0425 10/24/15 1521 10/25/15 0440  NA 136 136 135 135  K 3.7 3.4* 3.4* 3.8  CL 100* 98* 96* 99*  CO2 26 26  --  26  BUN 20 25* 27* 28*  CREATININE 1.45* 1.59* 1.70* 1.59*  GLUCOSE 159* 117* 175* 164*    Recent Labs Lab 10/23/15 0410 10/24/15 0425 10/24/15 1521 10/25/15 0440  HGB 7.7* 8.4* 8.2* 7.7*  HCT 22.5* 25.1* 24.0* 23.6*  WBC 8.2 7.8  --  4.5  PLT 108* 155  --  124*   Dg Chest Port 1 View  10/25/2015  CLINICAL DATA:  CABG. EXAM: PORTABLE CHEST 1 VIEW COMPARISON:  10/24/2015. FINDINGS: Endotracheal tube, feeding tube, Swan-Ganz catheter in stable position . Low lung volumes with bibasilar atelectasis and/or infiltrates/edema. Prior CABG. Cardiomegaly with mild pulmonary venous congestion. No pneumothorax. Surgical staples right neck. IMPRESSION: 1. Lines and tubes in stable position. 2. Prior CABG.  Cardiomegaly with mild pulmonary vascular prominence. Progressive bibasilar atelectasis and infiltrates/edema. Small bilateral pleural effusions. A component of congestive heart failure may be present . Electronically Signed   By: Maisie Fus  Register   On: 10/25/2015 08:06   Dg Chest Port 1 View  10/24/2015  CLINICAL DATA:  Post CABG x 4 EXAM: PORTABLE CHEST 1 VIEW COMPARISON:  Portable exam 0625 hours compared to 10/23/2015 FINDINGS: Tip of endotracheal tube projects 7.6 cm above carina. Nasogastric extends into stomach. LEFT subclavian Swan-Ganz catheter with tip projecting over bifurcation of main pulmonary artery. Enlargement of cardiac silhouette post CABG. Decreased lung volumes with bibasilar atelectasis. No gross infiltrate, pleural effusion, or pneumothorax. IMPRESSION: Low lung volumes with bibasilar atelectasis. Electronically Signed   By: Ulyses Southward M.D.   On: 10/24/2015 07:36   Dg Abd Portable 1v  10/24/2015  CLINICAL DATA:  Feeding tube placement. EXAM: PORTABLE ABDOMEN - 1 VIEW COMPARISON:  Earlier today. FINDINGS: Followup CT intestinal gas. Feeding tube tip in the duodenal wall. Nasogastric tube tip at the gastric pylorus. Cholecystectomy clips. Thoracic and lumbar spine degenerative changes. IMPRESSION: Feeding tube tip in the duodenal bulb.  No acute abnormality. Electronically Signed   By: Beckie Salts M.D.   On: 10/24/2015 11:54   Dg Abd Portable 1v  10/24/2015  CLINICAL DATA:  Feeding tube placement EXAM: PORTABLE ABDOMEN - 1 VIEW COMPARISON:  Portable exam 0629 hours compared to 10/23/2015  FINDINGS: Tip of nasogastric tube projects over pylorus. Tip of feeding tube is coiled and projects over second portion of duodenum. Epicardial pacing wires noted. Bowel gas pattern normal. No acute osseous findings. IMPRESSION: Feeding tube is coiled and projects over the second portion of the duodenum. Electronically Signed   By: Ulyses Southward M.D.   On: 10/24/2015 07:37   Dg Abd Portable  1v  10/23/2015  CLINICAL DATA:  Feeding tube adjustment. EXAM: PORTABLE ABDOMEN - 1 VIEW 4:19 p.m. COMPARISON:  10/23/2015 at 10:43 a.m. FINDINGS: Feeding tube tip and NG tube tip remain in the region of the pylorus or duodenal bulb, essentially unchanged. Bowel gas pattern is normal. Slight atelectasis at the lung bases. IMPRESSION: No change in the positions of the NG tube and feeding to. Tips are in the region of the duodenal bulb. Electronically Signed   By: Francene Boyers M.D.   On: 10/23/2015 16:39   Dg Abd Portable 1v  10/23/2015  CLINICAL DATA:  NG tube placement. EXAM: PORTABLE ABDOMEN - 1 VIEW COMPARISON:  Scout image for CT scan dated 08/03/2014 FINDINGS: The feeding tube tip and ET tube tip are both in the region of the pylorus or duodenal bulb. Both are in good position. Bowel gas pattern is normal. No acute bone abnormality. Degenerative changes throughout the lumbar spine with previous laminectomies at L4. IMPRESSION: NG tube and feeding tube tips are both in the region of the pylorus or duodenal bulb. Electronically Signed   By: Francene Boyers M.D.   On: 10/23/2015 11:19    STUDIES:  PFT 4/25 >> restrictive lung dz, low DLCO diffusion LE Doppler 5/2 >> prelim negative >>  CULTURES: Sputum 4/27 >>   ANTIBIOTICS: Cefuroxime 4/28 >> 4/30  SIGNIFICANT EVENTS: 4/28 CABG and CEA 5/01 Bradycardia / hypotension with any weaning of sedation 5/02 Episodes of desaturation, increased O2 needs 5/03 Weaned off dopamine, neo, sedation reduced  LINES/TUBES: 4/28  midine R arm >>  4/28  L subclavian introducer >  4/28  R neck drain >> 4/30 4/28  mediastinal chest tubes >> 4/30 5/01  PICC >>  ASSESSMENT / PLAN:   DISCUSSION: 70 y/o male with obesity hypoventilation syndrome who underwent a redo CABG and CEA on 4/28. Remains on vent post-operatively - anxiety, bradycardia/hypotension as barriers to extubation.   ASSESSMENT / PLAN:  PULMONARY A: Vent dependence Chronic respiratory  failure with hypercapnea Obesity hypoventilation syndrome P:   Continue PRVC until mental status / anxiety improves Will need nocturnal BIPAP post extubation Intermittent CXR Daily SBT / WUA   NEUROLOGIC A:   Acute encephalopathy - severe agitation, ddx broad including metabolic encephalopathy given baseline issues and medications given here, suspect baseline mental illness given multiple psychoactive meds at home including seroquel Chronic pain  P:   RASS goal: -1  Continue fentanyl gtt Seroquel to 150 BID and follow QTc (538 on 5/3) interval closely, titrate as able EKG in am  PAD protocol Continue cymbalta, buspar, remeron, & seroquel  CARDIOVASCULAR A:  S/p CABG and CEA P:  Per vascular and TCTS Monitor hemodynamics  RENAL A:   AKI Hypokalemia P:   Continue vasopressors and fluids as ordered Monitor BMET and UOP Lasix gtt per TCTS Replace electrolytes as needed    GASTROINTESTINAL A:   Morbid Obesity  P:   Cortrak placed 5/1, continue TF NGT clampled PPI Nutrition per surgery  HEMATOLOGIC A:   Anemia, no bleeding Thrombocytopenia - overall improved P:  Transfusion per usual ICU guidelines  Monitor platelets   INFECTIOUS A:   Fever post op, normal WBC so likely atelectasis.  No infiltrate on CXR 2022-11-12 P:   Monitor  Completed cefuroxime per surgery protocol Monitor sputum culture  ENDOCRINE A:   Hyperglycemia P:   Monitor off insulin gtt Continue home liraglutide, SSI    FAMILY  - Updates: updated bedside 5/1.  No family available 5/3 am.  The patients mother passed away Nov 12, 2022 (he is unaware).  They are arranging funeral plans.    Canary Brim, NP-C [place] [facility] Pgr: [phone] or if no answer [phone] 10/25/2015, 8:53 AM

## 2015-10-25 NOTE — Progress Notes (Signed)
Peripherally Inserted Central Catheter/Midline Placement  The IV Nurse has discussed with the patient and/or persons authorized to consent for the patient, the purpose of this procedure and the potential benefits and risks involved with this procedure.  The benefits include less needle sticks, lab draws from the catheter and patient may be discharged home with the catheter.  Risks include, but not limited to, infection, bleeding, blood clot (thrombus formation), and puncture of an artery; nerve damage and irregular heat beat.  Alternatives to this procedure were also discussed.  PICC/Midline Placement Documentation  PICC Triple Lumen 10/25/15 PICC Right Basilic 46 cm 2 cm (Active)  Indication for Insertion or Continuance of Line Vasoactive infusions 10/25/2015  2:00 PM  Exposed Catheter (cm) 2 cm 10/25/2015  2:00 PM  Dressing Change Due 11/01/15 10/25/2015  2:00 PM    Telephone consent   Stacie GlazeJoyce, Vernon Ariel Horton 10/25/2015, 2:12 PM

## 2015-10-26 ENCOUNTER — Inpatient Hospital Stay (HOSPITAL_COMMUNITY): Payer: Medicare Other

## 2015-10-26 DIAGNOSIS — J9621 Acute and chronic respiratory failure with hypoxia: Secondary | ICD-10-CM

## 2015-10-26 LAB — CBC
HCT: 24.2 % — ABNORMAL LOW (ref 39.0–52.0)
Hemoglobin: 7.7 g/dL — ABNORMAL LOW (ref 13.0–17.0)
MCH: 29.5 pg (ref 26.0–34.0)
MCHC: 31.8 g/dL (ref 30.0–36.0)
MCV: 92.7 fL (ref 78.0–100.0)
Platelets: 142 10*3/uL — ABNORMAL LOW (ref 150–400)
RBC: 2.61 MIL/uL — ABNORMAL LOW (ref 4.22–5.81)
RDW: 14.8 % (ref 11.5–15.5)
WBC: 5.1 10*3/uL (ref 4.0–10.5)

## 2015-10-26 LAB — GLUCOSE, CAPILLARY
Glucose-Capillary: 188 mg/dL — ABNORMAL HIGH (ref 65–99)
Glucose-Capillary: 208 mg/dL — ABNORMAL HIGH (ref 65–99)
Glucose-Capillary: 214 mg/dL — ABNORMAL HIGH (ref 65–99)
Glucose-Capillary: 232 mg/dL — ABNORMAL HIGH (ref 65–99)
Glucose-Capillary: 241 mg/dL — ABNORMAL HIGH (ref 65–99)
Glucose-Capillary: 244 mg/dL — ABNORMAL HIGH (ref 65–99)

## 2015-10-26 LAB — POCT I-STAT, CHEM 8
BUN: 30 mg/dL — ABNORMAL HIGH (ref 6–20)
Calcium, Ion: 1.09 mmol/L — ABNORMAL LOW (ref 1.13–1.30)
Chloride: 95 mmol/L — ABNORMAL LOW (ref 101–111)
Creatinine, Ser: 1.4 mg/dL — ABNORMAL HIGH (ref 0.61–1.24)
Glucose, Bld: 244 mg/dL — ABNORMAL HIGH (ref 65–99)
HCT: 27 % — ABNORMAL LOW (ref 39.0–52.0)
Hemoglobin: 9.2 g/dL — ABNORMAL LOW (ref 13.0–17.0)
Potassium: 3.9 mmol/L (ref 3.5–5.1)
Sodium: 138 mmol/L (ref 135–145)
TCO2: 30 mmol/L (ref 0–100)

## 2015-10-26 LAB — CARBOXYHEMOGLOBIN
Carboxyhemoglobin: 1.5 % (ref 0.5–1.5)
Methemoglobin: 1 % (ref 0.0–1.5)
O2 Saturation: 65.7 %
Total hemoglobin: 8.5 g/dL — ABNORMAL LOW (ref 13.5–18.0)

## 2015-10-26 LAB — POCT I-STAT 3, ART BLOOD GAS (G3+)
Acid-Base Excess: 7 mmol/L — ABNORMAL HIGH (ref 0.0–2.0)
Bicarbonate: 31.5 mEq/L — ABNORMAL HIGH (ref 20.0–24.0)
O2 Saturation: 94 %
Patient temperature: 100
TCO2: 33 mmol/L (ref 0–100)
pCO2 arterial: 47.8 mmHg — ABNORMAL HIGH (ref 35.0–45.0)
pH, Arterial: 7.429 (ref 7.350–7.450)
pO2, Arterial: 72 mmHg — ABNORMAL LOW (ref 80.0–100.0)

## 2015-10-26 LAB — BASIC METABOLIC PANEL
ANION GAP: 10 (ref 5–15)
BUN: 29 mg/dL — ABNORMAL HIGH (ref 6–20)
CHLORIDE: 99 mmol/L — AB (ref 101–111)
CO2: 28 mmol/L (ref 22–32)
Calcium: 8.1 mg/dL — ABNORMAL LOW (ref 8.9–10.3)
Creatinine, Ser: 1.48 mg/dL — ABNORMAL HIGH (ref 0.61–1.24)
GFR calc Af Amer: 53 mL/min — ABNORMAL LOW (ref >60)
GFR, EST NON AFRICAN AMERICAN: 46 mL/min — AB (ref >60)
GLUCOSE: 213 mg/dL — AB (ref 65–99)
POTASSIUM: 3.7 mmol/L (ref 3.5–5.1)
Sodium: 137 mmol/L (ref 135–145)

## 2015-10-26 MED ORDER — PROPOFOL 500 MG/50ML IV EMUL
5.0000 ug/kg/min | Freq: Once | INTRAVENOUS | Status: AC
  Administered 2015-10-27: 50 ug/kg/min via INTRAVENOUS

## 2015-10-26 MED ORDER — POTASSIUM CHLORIDE 10 MEQ/50ML IV SOLN
10.0000 meq | INTRAVENOUS | Status: AC
  Administered 2015-10-26 (×3): 10 meq via INTRAVENOUS
  Filled 2015-10-26 (×3): qty 50

## 2015-10-26 MED ORDER — GERHARDT'S BUTT CREAM
TOPICAL_CREAM | CUTANEOUS | Status: DC | PRN
  Filled 2015-10-26: qty 1

## 2015-10-26 MED ORDER — VITAL HIGH PROTEIN PO LIQD
1000.0000 mL | ORAL | Status: DC
  Administered 2015-10-27 – 2015-11-05 (×8): 1000 mL
  Filled 2015-10-26 (×11): qty 1000

## 2015-10-26 MED ORDER — VITAL HIGH PROTEIN PO LIQD
1000.0000 mL | ORAL | Status: DC
  Administered 2015-10-26: 1000 mL

## 2015-10-26 MED ORDER — POTASSIUM CHLORIDE 10 MEQ/50ML IV SOLN
10.0000 meq | INTRAVENOUS | Status: AC
  Administered 2015-10-26 (×2): 10 meq via INTRAVENOUS

## 2015-10-26 MED ORDER — MIDAZOLAM HCL 2 MG/2ML IJ SOLN
4.0000 mg | Freq: Once | INTRAMUSCULAR | Status: DC
  Filled 2015-10-26: qty 4

## 2015-10-26 MED ORDER — ETOMIDATE 2 MG/ML IV SOLN
40.0000 mg | Freq: Once | INTRAVENOUS | Status: AC
  Administered 2015-10-27: 20 mg via INTRAVENOUS

## 2015-10-26 MED ORDER — VECURONIUM BROMIDE 10 MG IV SOLR
10.0000 mg | Freq: Once | INTRAVENOUS | Status: AC
  Administered 2015-10-27: 10 mg via INTRAVENOUS

## 2015-10-26 MED ORDER — PANTOPRAZOLE SODIUM 40 MG PO PACK
40.0000 mg | PACK | Freq: Every day | ORAL | Status: DC
  Administered 2015-10-26 – 2015-11-10 (×14): 40 mg
  Filled 2015-10-26 (×16): qty 20

## 2015-10-26 MED ORDER — LEVALBUTEROL HCL 1.25 MG/0.5ML IN NEBU
1.2500 mg | INHALATION_SOLUTION | Freq: Three times a day (TID) | RESPIRATORY_TRACT | Status: DC
  Administered 2015-10-26 – 2015-11-02 (×21): 1.25 mg via RESPIRATORY_TRACT
  Filled 2015-10-26 (×21): qty 0.5

## 2015-10-26 MED ORDER — FENTANYL CITRATE (PF) 100 MCG/2ML IJ SOLN: 200.0000 ug | Freq: Once | INTRAMUSCULAR | Status: DC

## 2015-10-26 NOTE — Progress Notes (Signed)
Inpatient Diabetes Program Recommendations  AACE/ADA: New Consensus Statement on Inpatient Glycemic Control (2015)  Target Ranges:  Prepandial:   less than 140 mg/dL      Peak postprandial:   less than 180 mg/dL (1-2 hours)      Critically ill patients:  140 - 180 mg/dL   Review of Glycemic Control  Results for Tyler DeutscherSMITH, Tarvis E (MRN [id number]) as of 10/26/2015 12:09  Ref. Range 10/25/2015 20:11 10/26/2015 00:26 10/26/2015 04:03 10/26/2015 08:08  Glucose-Capillary Latest Ref Range: 65-99 mg/dL 409201 (H) 811188 (H) 914214 (H) 208 (H)  Blood sugars increasing. Would benefit from protocol.  Inpatient Diabetes Program Recommendations:    ICU Hyperglycemia Protocol.  Will follow. Thank you. Ailene Ardshonda Acea Yagi, RD, LDN, CDE Inpatient Diabetes Coordinator [phone]

## 2015-10-26 NOTE — Progress Notes (Signed)
Name: Tyler Le MRN: [id number] DOB: 1945/12/25    ADMISSION DATE:  10/16/2015 CONSULTATION DATE:  10/18/15  REFERRING MD : Donata Clay, MD  CHIEF COMPLAINT:  Hypercapnia   SUBJECTIVE:    Following commands Oxygenation better On pressure support this morning  VITAL SIGNS: Temp:  [98.1 F (36.7 C)-100.2 F (37.9 C)] 100.2 F (37.9 C) (05/04 0811) Pulse Rate:  [78-103] 98 (05/04 0700) Resp:  [0-52] 14 (05/04 0700) BP: (74-236)/(34-77) 149/52 mmHg (05/04 0700) SpO2:  [82 %-100 %] 97 % (05/04 0825) Arterial Line BP: (91-273)/(34-86) 179/54 mmHg (05/04 0600) FiO2 (%):  [40 %-50 %] 40 % (05/04 0825) Weight:  [136 kg (299 lb 13.2 oz)] 136 kg (299 lb 13.2 oz) (05/04 0451)  PHYSICAL EXAMINATION: General:  Sedated on vent HENT: NCAT ETT in place PULM: few crackles in bases, vent supported breaths CV: RRR, no clear murmur GI: BS+, soft MSK: normal bulk and tone Neuro: follows commands for me on sedation eyes closed, calm   Recent Labs Lab 10/24/15 0425  10/25/15 0440 10/25/15 1617 10/26/15 0457  NA 136  < > 135 137 137  K 3.4*  < > 3.8 3.8 3.7  CL 98*  < > 99* 96* 99*  CO2 26  --  26  --  28  BUN 25*  < > 28* 34* 29*  CREATININE 1.59*  < > 1.59* 1.50* 1.48*  GLUCOSE 117*  < > 164* 197* 213*  < > = values in this interval not displayed.  Recent Labs Lab 10/24/15 0425  10/25/15 0440 10/25/15 1617 10/26/15 0457  HGB 8.4*  < > 7.7* 9.5* 7.7*  HCT 25.1*  < > 23.6* 28.0* 24.2*  WBC 7.8  --  4.5  --  5.1  PLT 155  --  124*  --  142*  < > = values in this interval not displayed. Dg Chest Port 1 View  10/26/2015  CLINICAL DATA:  Redo CABG. EXAM: PORTABLE CHEST 1 VIEW COMPARISON:  10/25/2015 and 10/24/2015. FINDINGS: 0646 hours. The endotracheal tube and nasogastric tube appear unchanged in position. The Swan-Ganz catheter has been removed. Left IJ sheath remains in the left subclavian vein. There is a new right arm PICC which extends the level of the mid right atrium.  The heart size and mediastinal contours are stable status post CABG. Overall basilar aeration has improved with mild bibasilar atelectasis and small left-greater-than-right pleural effusions. There is no pneumothorax. IMPRESSION: Interval slight improvement in bibasilar aeration. Right arm PICC extends to the level of the mid right atrium. Electronically Signed   By: Carey Bullocks M.D.   On: 10/26/2015 08:09   Dg Chest Port 1 View  10/25/2015  CLINICAL DATA:  CABG. EXAM: PORTABLE CHEST 1 VIEW COMPARISON:  10/24/2015. FINDINGS: Endotracheal tube, feeding tube, Swan-Ganz catheter in stable position . Low lung volumes with bibasilar atelectasis and/or infiltrates/edema. Prior CABG. Cardiomegaly with mild pulmonary venous congestion. No pneumothorax. Surgical staples right neck. IMPRESSION: 1. Lines and tubes in stable position. 2. Prior CABG. Cardiomegaly with mild pulmonary vascular prominence. Progressive bibasilar atelectasis and infiltrates/edema. Small bilateral pleural effusions. A component of congestive heart failure may be present . Electronically Signed   By: Maisie Fus  Register   On: 10/25/2015 08:06   Dg Abd Portable 1v  10/24/2015  CLINICAL DATA:  Feeding tube placement. EXAM: PORTABLE ABDOMEN - 1 VIEW COMPARISON:  Earlier today. FINDINGS: Followup CT intestinal gas. Feeding tube tip in the duodenal wall. Nasogastric tube tip at the gastric  pylorus. Cholecystectomy clips. Thoracic and lumbar spine degenerative changes. IMPRESSION: Feeding tube tip in the duodenal bulb.  No acute abnormality. Electronically Signed   By: Beckie SaltsSteven  Reid M.D.   On: 10/24/2015 11:54    STUDIES:  PFT 4/25 >> restrictive lung dz, low DLCO diffusion LE Doppler 5/2 >>  negative  CULTURES: Sputum 4/27 >>   ANTIBIOTICS: Cefuroxime 4/28 >> 4/30  SIGNIFICANT EVENTS: 4/28 CABG and CEA 5/01 Bradycardia / hypotension with any weaning of sedation 5/02 Episodes of desaturation, increased O2 needs 5/03 Weaned off dopamine,  neo, sedation reduced  LINES/TUBES: 4/28  midine R arm >>  4/28  L subclavian introducer >  4/28  R neck drain >> 4/30 4/28  mediastinal chest tubes >> 4/30 5/01  PICC >>  ASSESSMENT / PLAN:   DISCUSSION: 70 y/o male with obesity hypoventilation syndrome who underwent a redo CABG and CEA on 4/28. Remains on vent post-operatively - anxiety, bradycardia/hypotension as barriers to extubation but these are improving.  ASSESSMENT / PLAN:  PULMONARY A: Vent dependence  Chronic respiratory failure with hypercapnea > improving Obesity hypoventilation syndrome P:   Pressure support today Agree with tracheostomy, suspect he will be able to go to trach collar quickly but will need vent at night for a while Plan trach 5/5 Intermittent CXR Daily SBT / WUA   NEUROLOGIC A:   Acute encephalopathy > improving slowly Baseline mental illness, poorly understood, multiple medications at home Chronic pain  P:   RASS goal: -1  Continue fentanyl gtt Continue seroquel 150 BID and follow QTc (538 on 5/3) interval closely, titrate as able PAD protocol Continue cymbalta, buspar, remeron  CARDIOVASCULAR A:  S/p CABG and CEA P:  Per vascular and TCTS Monitor hemodynamics Continue lasix  RENAL A:   AKI improving P:   Monitor BMET and UOP Lasix gtt per TCTS Replace electrolytes as needed    GASTROINTESTINAL A:   Morbid Obesity  P:   Cortrak placed 5/1, continue TF NGT clampled PPI Nutrition per surgery  HEMATOLOGIC A:   Anemia, no bleeding Thrombocytopenia - overall improved P:  Transfusion per usual ICU guidelines Monitor platelets   INFECTIOUS A:   Fever post op, normal WBC so likely atelectasis.  No infiltrate on CXR 5/2 P:   Monitor off of antibiotics  ENDOCRINE A:   Hyperglycemia P:   Continue home liraglutide, SSI    FAMILY  - Updates: updated wife bedside 5/3.  The patients mother passed away 5/2 (he is unaware).    My cc time 38 minutes  Heber CarolinaBrent  McQuaid, MD [place] [facility] Pager: [phone] Cell: [phone] After 3pm or if no response, call [phone]   10/26/2015, 9:03 AM

## 2015-10-26 NOTE — Care Management Important Message (Signed)
Important Message  Patient Details  Name: Clide DeutscherDwight E Regina MRN: [id number] Date of Birth: February 24, 1946   Medicare Important Message Given:  Yes    [name], Jaquay Morneault [name] 10/26/2015, 12:58 PM

## 2015-10-26 NOTE — Progress Notes (Signed)
TCTS BRIEF SICU PROGRESS NOTE  6 Days Post-Op  S/P Procedure(s) (LRB): REDO CORONARY ARTERY BYPASS GRAFTING (CABG), TIMES THREE, USING LEFT GREATER SAPHENOUS VEIN HARVESTED ENDOSCOPICALLY AND CRYO VEIN, WITH CORONARY ENDARTERECTOMY (N/A) TRANSESOPHAGEAL ECHOCARDIOGRAM (TEE) (N/A) ENDARTERECTOMY CAROTID (Right)   Stable day although reportedly patient did poorly w/ vent weaning trials today NSR w/ stable BP O2 sats 95% on 60% FiO2 UOP adequate  Plan: Continue current plan  Purcell Nailslarence H Alie Hardgrove, MD 10/26/2015 6:18 PM

## 2015-10-27 ENCOUNTER — Inpatient Hospital Stay (HOSPITAL_COMMUNITY): Payer: Medicare Other

## 2015-10-27 DIAGNOSIS — J9601 Acute respiratory failure with hypoxia: Secondary | ICD-10-CM | POA: Insufficient documentation

## 2015-10-27 LAB — POCT I-STAT, CHEM 8
BUN: 28 mg/dL — ABNORMAL HIGH (ref 6–20)
Calcium, Ion: 1.09 mmol/L — ABNORMAL LOW (ref 1.13–1.30)
Chloride: 96 mmol/L — ABNORMAL LOW (ref 101–111)
Creatinine, Ser: 1.4 mg/dL — ABNORMAL HIGH (ref 0.61–1.24)
Glucose, Bld: 130 mg/dL — ABNORMAL HIGH (ref 65–99)
HCT: 22 % — ABNORMAL LOW (ref 39.0–52.0)
Hemoglobin: 7.5 g/dL — ABNORMAL LOW (ref 13.0–17.0)
Potassium: 3.5 mmol/L (ref 3.5–5.1)
Sodium: 140 mmol/L (ref 135–145)
TCO2: 32 mmol/L (ref 0–100)

## 2015-10-27 LAB — GLUCOSE, CAPILLARY
Glucose-Capillary: 131 mg/dL — ABNORMAL HIGH (ref 65–99)
Glucose-Capillary: 153 mg/dL — ABNORMAL HIGH (ref 65–99)
Glucose-Capillary: 170 mg/dL — ABNORMAL HIGH (ref 65–99)
Glucose-Capillary: 173 mg/dL — ABNORMAL HIGH (ref 65–99)
Glucose-Capillary: 179 mg/dL — ABNORMAL HIGH (ref 65–99)
Glucose-Capillary: 187 mg/dL — ABNORMAL HIGH (ref 65–99)

## 2015-10-27 LAB — COMPREHENSIVE METABOLIC PANEL
ALT: 22 U/L (ref 17–63)
AST: 21 U/L (ref 15–41)
Albumin: 2.6 g/dL — ABNORMAL LOW (ref 3.5–5.0)
Alkaline Phosphatase: 63 U/L (ref 38–126)
Anion gap: 14 (ref 5–15)
BUN: 30 mg/dL — ABNORMAL HIGH (ref 6–20)
CO2: 32 mmol/L (ref 22–32)
Calcium: 8.5 mg/dL — ABNORMAL LOW (ref 8.9–10.3)
Chloride: 95 mmol/L — ABNORMAL LOW (ref 101–111)
Creatinine, Ser: 1.48 mg/dL — ABNORMAL HIGH (ref 0.61–1.24)
GFR calc Af Amer: 53 mL/min — ABNORMAL LOW (ref >60)
GFR calc non Af Amer: 46 mL/min — ABNORMAL LOW (ref >60)
Glucose, Bld: 181 mg/dL — ABNORMAL HIGH (ref 65–99)
Potassium: 3.6 mmol/L (ref 3.5–5.1)
Sodium: 141 mmol/L (ref 135–145)
Total Bilirubin: 0.9 mg/dL (ref 0.3–1.2)
Total Protein: 6.3 g/dL — ABNORMAL LOW (ref 6.5–8.1)

## 2015-10-27 LAB — CULTURE, RESPIRATORY W GRAM STAIN: Special Requests: NORMAL

## 2015-10-27 LAB — CBC
HCT: 24.4 % — ABNORMAL LOW (ref 39.0–52.0)
Hemoglobin: 7.7 g/dL — ABNORMAL LOW (ref 13.0–17.0)
MCH: 29.6 pg (ref 26.0–34.0)
MCHC: 31.6 g/dL (ref 30.0–36.0)
MCV: 93.8 fL (ref 78.0–100.0)
Platelets: 177 10*3/uL (ref 150–400)
RBC: 2.6 MIL/uL — ABNORMAL LOW (ref 4.22–5.81)
RDW: 14.7 % (ref 11.5–15.5)
WBC: 5.8 10*3/uL (ref 4.0–10.5)

## 2015-10-27 LAB — BLOOD GAS, ARTERIAL
Acid-Base Excess: 7.4 mmol/L — ABNORMAL HIGH (ref 0.0–2.0)
Bicarbonate: 31.2 mEq/L — ABNORMAL HIGH (ref 20.0–24.0)
Drawn by: 36496
FIO2: 0.5
MECHVT: 700 mL
O2 Saturation: 95 %
PEEP: 8 cmH2O
Patient temperature: 98.6
RATE: 14 resp/min
TCO2: 32.5 mmol/L (ref 0–100)
pCO2 arterial: 42.5 mmHg (ref 35.0–45.0)
pH, Arterial: 7.479 — ABNORMAL HIGH (ref 7.350–7.450)
pO2, Arterial: 74.5 mmHg — ABNORMAL LOW (ref 80.0–100.0)

## 2015-10-27 LAB — CARBOXYHEMOGLOBIN
Carboxyhemoglobin: 1.3 % (ref 0.5–1.5)
Methemoglobin: 0.9 % (ref 0.0–1.5)
O2 Saturation: 64 %
Total hemoglobin: 7.9 g/dL — ABNORMAL LOW (ref 13.5–18.0)

## 2015-10-27 LAB — PREPARE RBC (CROSSMATCH)

## 2015-10-27 MED ORDER — FUROSEMIDE 10 MG/ML IJ SOLN
40.0000 mg | Freq: Two times a day (BID) | INTRAMUSCULAR | Status: DC
  Administered 2015-10-27 – 2015-10-28 (×3): 40 mg via INTRAVENOUS
  Filled 2015-10-27 (×3): qty 4

## 2015-10-27 MED ORDER — POTASSIUM CHLORIDE 10 MEQ/50ML IV SOLN
10.0000 meq | INTRAVENOUS | Status: AC
  Administered 2015-10-27 (×3): 10 meq via INTRAVENOUS
  Filled 2015-10-27 (×2): qty 50

## 2015-10-27 MED ORDER — POTASSIUM CHLORIDE 10 MEQ/50ML IV SOLN
10.0000 meq | INTRAVENOUS | Status: AC
  Administered 2015-10-27 (×2): 10 meq via INTRAVENOUS
  Filled 2015-10-27: qty 50

## 2015-10-27 MED ORDER — POTASSIUM CHLORIDE 10 MEQ/50ML IV SOLN
10.0000 meq | INTRAVENOUS | Status: AC
  Administered 2015-10-27 (×3): 10 meq via INTRAVENOUS
  Filled 2015-10-27: qty 50

## 2015-10-27 NOTE — Procedures (Signed)
Percutaneous Tracheostomy Placement  Consent from family.  Patient sedated, paralyzed and position.  Placed on 100% FiO2 and RR matched.  Area cleaned and draped.  Lidocaine/epi injected.  Skin incision done followed by blunt dissection.  While attempting to enter trachea injured a blood vessel that was found and sutured.  Trachea palpated then punctured, catheter passed and visualized bronchoscopically.  Wire placed and visualized.  Catheter removed.  Airway then entered and dilated.  Size 6 cuffed shiley trach placed and visualized bronchoscopically well above carina.  Good volume returns.  Patient tolerated the procedure well without complications.  Minimal blood loss.  CXR ordered and pending.  Dr. Tyson AliasFeinstein was first assist on the procedure.  Alyson ReedyWesam G. Reshunda Strider, M.D. [facility]. Pager: [phone]. After hours pager: [phone].

## 2015-10-27 NOTE — Procedures (Signed)
PCCM Video Bronchoscopy Procedure Note  The patient was informed of the risks (including but not limited to bleeding, infection, respiratory failure, lung injury, tooth/oral injury) and benefits of the procedure and gave consent, see chart.  Indication: acute on chronic respiratory failure with hypoxemia; visualization for tracheostomy  Post Procedure Diagnosis: Same  Location: 2 [address] bed 10 [place] ICU  Condition pre procedure: Critically ill on vent  Medications for procedure: Versed, Propofol, Fentanyl, Vecuronium  Procedure description: The bronchoscope was introduced through the endotracheal tube and passed to the bilateral lungs to the level of the subsegmental bronchi throughout the tracheobronchial tree.  Airway exam revealed thick yellow secretions in the trachea and left mainstem bronchus.  The bronchoscope was used to facilitate the percutaneous tracheostomy by Dr. Molli KnockYacoub.  Procedures performed: none  Specimens sent: none  Condition post procedure: critically ill, stable  EBL: none per bronchoscopy  Complications: none immediate  Heber CarolinaBrent Verdun Rackley, MD [place] [facility] Pager: [phone] Cell: [phone] After 3pm or if no response, call [phone]

## 2015-10-27 NOTE — Progress Notes (Signed)
7 Days Post-Op Procedure(s) (LRB): REDO CORONARY ARTERY BYPASS GRAFTING (CABG), TIMES THREE, USING LEFT GREATER SAPHENOUS VEIN HARVESTED ENDOSCOPICALLY AND CRYO VEIN, WITH CORONARY ENDARTERECTOMY (N/A) TRANSESOPHAGEAL ECHOCARDIOGRAM (TEE) (N/A) ENDARTERECTOMY CAROTID (Right) Subjective: One week after redo CABG, coronary endarterectomy, carotid endarterectomy Had trach today since unable to wean vent due to COPD,OSA Cardiac fx good Objective: Vital signs in last 24 hours: Temp:  [98.7 F (37.1 C)-99.7 F (37.6 C)] 99.5 F (37.5 C) (05/05 1549) Pulse Rate:  [88-113] 92 (05/05 1615) Cardiac Rhythm:  [-] Normal sinus rhythm (05/05 1600) Resp:  [14-24] 14 (05/05 1615) BP: (77-162)/(34-79) 125/52 mmHg (05/05 1615) SpO2:  [92 %-100 %] 94 % (05/05 1615) Arterial Line BP: (89-154)/(51-97) 120/65 mmHg (05/04 2200) FiO2 (%):  [50 %-100 %] 60 % (05/05 1549) Weight:  [304 lb 7.3 oz (138.1 kg)] 304 lb 7.3 oz (138.1 kg) (05/05 0500)  Hemodynamic parameters for last 24 hours: CVP:  [18 mmHg-20 mmHg] 20 mmHg  Intake/Output from previous day: 05/04 0701 - 05/05 0700 In: 2428.9 [I.V.:903.9; NG/GT:1325; IV Piggyback:200] Out: 4275 [Urine:4275] Intake/Output this shift: Total I/O In: 604 [I.V.:275; Blood:239; NG/GT:90] Out: 1170 [Urine:1170]       Exam    General- alert and comfortable   Lungs- clear without rales, wheezes   Cor- regular rate and rhythm, no murmur , gallop   Abdomen- soft, non-tender   Extremities - warm, non-tender, minimal edema   Neuro- oriented, appropriate, no focal weakness   Lab Results:  Recent Labs  10/26/15 0457  10/27/15 0430 10/27/15 1700  WBC 5.1  --  5.8  --   HGB 7.7*  < > 7.7* 7.5*  HCT 24.2*  < > 24.4* 22.0*  PLT 142*  --  177  --   < > = values in this interval not displayed. BMET:  Recent Labs  10/26/15 0457  10/27/15 0430 10/27/15 1700  NA 137  < > 141 140  K 3.7  < > 3.6 3.5  CL 99*  < > 95* 96*  CO2 28  --  32  --   GLUCOSE 213*   < > 181* 130*  BUN 29*  < > 30* 28*  CREATININE 1.48*  < > 1.48* 1.40*  CALCIUM 8.1*  --  8.5*  --   < > = values in this interval not displayed.  PT/INR: No results for input(s): LABPROT, INR in the last 72 hours. ABG    Component Value Date/Time   PHART 7.479* 10/27/2015 0305   HCO3 31.2* 10/27/2015 0305   TCO2 32 10/27/2015 1700   ACIDBASEDEF 1.0 10/20/2015 1824   O2SAT 64.0 10/27/2015 0635   CBG (last 3)   Recent Labs  10/27/15 0423 10/27/15 0828 10/27/15 1336  GLUCAP 173* 170* 131*    Assessment/Plan: S/P Procedure(s) (LRB): REDO CORONARY ARTERY BYPASS GRAFTING (CABG), TIMES THREE, USING LEFT GREATER SAPHENOUS VEIN HARVESTED ENDOSCOPICALLY AND CRYO VEIN, WITH CORONARY ENDARTERECTOMY (N/A) TRANSESOPHAGEAL ECHOCARDIOGRAM (TEE) (N/A) ENDARTERECTOMY CAROTID (Right) Transfuse for postop blood loss anemia   LOS: 11 days    Kathlee Nationseter Van Trigt III 10/27/2015

## 2015-10-27 NOTE — Progress Notes (Signed)
RT put patient in wean mode with PS of 12 and a peep of 8. Patient automatically desaturated to 78%. RT put patient back to full support and turned FIO2 to 100% for 2 minutes to allow his O2 saturation to come back up. RT will continue to monitor.

## 2015-10-27 NOTE — Plan of Care (Signed)
Problem: Respiratory: Goal: Ability to tolerate decreased levels of ventilator support will improve Outcome: Not Progressing Trach placed today; pt still on ventilator with FiO2 of 60% currently.

## 2015-10-27 NOTE — Procedures (Signed)
Bedside Tracheostomy Insertion Procedure Note   Patient Details:   Name: Tyler Le DOB: [birth year] MRN: [id number]  Procedure: Tracheostomy  Pre Procedure Assessment: ET Tube Size:7.5 ET Tube secured at lip (cm):23 Bite block in place: no Breath Sounds: bilateral breath sounds  Post Procedure Assessment: BP 104/38 mmHg  Pulse 95  Temp(Src) 99 F (37.2 C) (Axillary)  Resp 14  Ht 6\' 4"  (1.93 m)  Wt 304 lb 7.3 oz (138.1 kg)  BMI 37.07 kg/m2  SpO2 95% O2 sats:97 Complications:none Patient did tolerate procedure well Tracheostomy Brand:shiley Tracheostomy Style:cuffed Tracheostomy Size: 6 Tracheostomy Secured WUJ:[id number]:sutures and velcro ties Tracheostomy Placement Confirmation:direct visualization    Shanda BumpsBrewer, Analis Distler Faye 10/27/2015, 1:23 PM

## 2015-10-27 NOTE — Progress Notes (Signed)
[facility]  I have seen and examined the patient with nurse practitioner/resident and agree with the note above.  We formulated the plan together and I elicited the following history.    [name] had hypoxemia this morning with pressure support ventilation Still has periods of agitation, requiring fentanyl boluses  On exam Sedated Lungs with bilateral air entry, vent supported breaths CV: RRR, midline scar well healed Belly: soft, non tender  Acute on chronic respiratory failure with hypercapnea and hypoxemia > plan tracheostomy today, wean vent quickly, will plan for full vent support in the evenings through the weekend  Pulmonary edema> lasix on hold, seems appropriate  Acute encephaloapthy. Still a problem, suspect baseline PTSD or other mental health abnormality, continue seroquel, monitor QTc  Post cardiac surgery> per TCTS  My cc time 31 minutes  Heber CarolinaBrent McQuaid, MD [place] [facility] Pager: [phone] Cell: [phone] After 3pm or if no response, call [phone]

## 2015-10-27 NOTE — Care Management Note (Signed)
Case Management Note  Patient Details  Name: Tyler DeutscherDwight E Cragle MRN: [id number] Date of Birth: Jun 27, 1945  Subjective/Objective:  Pt admitted for Unstable Angina. Plan for CABG on 10-20-15. Pt is from home with wife. Pt has no DME needs at this time.                 Action/Plan: 10/27/2015 Pt is from home with wife.  Pt remains ventilated on sedation and pressors.  CM will continue to monitor for disposition needs  CM will continue to monitor for disposition needs.   Expected Discharge Date:                  Expected Discharge Plan:  Skilled Nursing Facility  In-House Referral:  NA, Clinical Social Work  Discharge planning Services  CM Consult  Post Acute Care Choice:    Choice offered to:     DME Arranged:    DME Agency:     HH Arranged:    HH Agency:     Status of Service:  In process, will continue to follow  Medicare Important Message Given:  Yes Date Medicare IM Given:    Medicare IM give by:    Date Additional Medicare IM Given:    Additional Medicare Important Message give by:     If discussed at Long Length of Stay Meetings, dates discussed:    Additional Comments: 10/27/2015 Pt trached today 10/27/15.  CM had very detailed discussion with wife regarding discharge options should pt not be able to wean off vent/trach prior to discharge including;  Home with Home Health (requirements explained in detail), LTAC, and SNF.  CM explained that discharge planning begins early so that barriers can be eliminated prior to possible delays in progression of care.  CM will continue to follow pt and family and assist with discharge needs.  CSW consulted on tentative consult.   Cherylann ParrClaxton, Collan Schoenfeld S, RN 10/27/2015, 2:43 PM

## 2015-10-27 NOTE — Progress Notes (Signed)
Name: Tyler Le MRN: [id number] DOB: 1945-08-22    ADMISSION DATE:  10/16/2015 CONSULTATION DATE:  10/18/15  REFERRING MD : Donata Clay, MD  CHIEF COMPLAINT:  Hypercapnia   SUBJECTIVE:    Spoke with RN pt follows commands intermittently, attempted SBT wean mode PS of 12 and a peep of 8.  Pt placed back on full vent support due to desaturation O2 sats decreased to 78% requiring FiO2 to be increased to 100% for 2 minutes to allow pt to recover.  Pt having periods of agitation this am requiring fentanyl boluses prn.  VITAL SIGNS: Temp:  [98.7 F (37.1 C)-99.7 F (37.6 C)] 99.7 F (37.6 C) (05/05 0800) Pulse Rate:  [90-113] 95 (05/05 0600) Resp:  [14-26] 14 (05/05 0600) BP: (91-160)/(39-79) 107/57 mmHg (05/05 0600) SpO2:  [88 %-99 %] 95 % (05/05 0800) Arterial Line BP: (88-198)/(51-97) 120/65 mmHg (05/04 2200) FiO2 (%):  [40 %-60 %] 50 % (05/05 0800) Weight:  [304 lb 7.3 oz (138.1 kg)] 304 lb 7.3 oz (138.1 kg) (05/05 0500)  PHYSICAL EXAMINATION: General:  Obese male; sedated on vent HENT: NCAT ETT in place PULM: few crackles throughout, non labored breathing, vent supported breaths CV: RRR, no clear murmur GI: BS+, soft MSK: normal bulk and tone, moves all extremities Neuro: intermittently follows commands on sedation, intermittent agitation   Recent Labs Lab 10/25/15 0440  10/26/15 0457 10/26/15 1626 10/27/15 0430  NA 135  < > 137 138 141  K 3.8  < > 3.7 3.9 3.6  CL 99*  < > 99* 95* 95*  CO2 26  --  28  --  32  BUN 28*  < > 29* 30* 30*  CREATININE 1.59*  < > 1.48* 1.40* 1.48*  GLUCOSE 164*  < > 213* 244* 181*  < > = values in this interval not displayed.  Recent Labs Lab 10/25/15 0440  10/26/15 0457 10/26/15 1626 10/27/15 0430  HGB 7.7*  < > 7.7* 9.2* 7.7*  HCT 23.6*  < > 24.2* 27.0* 24.4*  WBC 4.5  --  5.1  --  5.8  PLT 124*  --  142*  --  177  < > = values in this interval not displayed. Dg Chest Port 1 View  10/27/2015  CLINICAL DATA:  Shortness of  breath, Coronary artery disease and CABG, former heavy smoker. EXAM: PORTABLE CHEST 1 VIEW COMPARISON:  Portable chest x-ray of Oct 26, 2015 FINDINGS: The lungs are mildly hypoinflated. There is persistent increased density in the retrocardiac region on the left and at the right lung base medially. The left hemidiaphragm is obscured. The cardiac silhouette is enlarged. The pulmonary vascularity is not engorged. The endotracheal tube tip lies approximately 5.6 cm above the carina. The esophagogastric tube tip projects below the inferior margin of the image. The right PICC line tip projects over the distal third of the SVC. IMPRESSION: Persistent cardiomegaly without significant pulmonary vascular congestion today. Persistent bibasilar atelectasis greater on the left. Small left pleural effusion as well. The support tubes are in reasonable position. Electronically Signed   By: David  [place] M.D.   On: 10/27/2015 07:35   Dg Chest Port 1 View  10/26/2015  CLINICAL DATA:  Redo CABG. EXAM: PORTABLE CHEST 1 VIEW COMPARISON:  10/25/2015 and 10/24/2015. FINDINGS: 0646 hours. The endotracheal tube and nasogastric tube appear unchanged in position. The Swan-Ganz catheter has been removed. Left IJ sheath remains in the left subclavian vein. There is a new right arm PICC which extends  the level of the mid right atrium. The heart size and mediastinal contours are stable status post CABG. Overall basilar aeration has improved with mild bibasilar atelectasis and small left-greater-than-right pleural effusions. There is no pneumothorax. IMPRESSION: Interval slight improvement in bibasilar aeration. Right arm PICC extends to the level of the mid right atrium. Electronically Signed   By: Carey Bullocks M.D.   On: 10/26/2015 08:09    STUDIES:  PFT 4/25 >> restrictive lung dz, low DLCO diffusion LE Doppler Nov 04, 2022 >>  negative  CULTURES: Sputum 5/5 >>few yeast consistent with candida species  ANTIBIOTICS: Cefuroxime 4/28 >>  4/30  SIGNIFICANT EVENTS: 4/28 CABG and CEA 5/01 Bradycardia / hypotension with any weaning of sedation 11/04/2022 Episodes of desaturation, increased O2 needs 5/03 Weaned off dopamine, neo, sedation reduced  LINES/TUBES: 4/28  midine R arm >>5/1  4/28  L subclavian introducer >  4/28  R neck drain >> 4/30 4/28  mediastinal chest tubes >> 4/30 5/01  PICC >>  ASSESSMENT / PLAN:   DISCUSSION: 70 y/o male with obesity hypoventilation syndrome who underwent a redo CABG and CEA on 4/28. Remains on vent post-operatively - anxiety, bradycardia/hypotension as barriers to extubation but these are improving. Plans for trach 5/5  ASSESSMENT / PLAN:  PULMONARY A: Vent dependence  Chronic respiratory failure with hypercapnea > improving Obesity hypoventilation syndrome P:   Agree with tracheostomy, suspect he will be able to go to trach collar quickly but will need vent at night for a while Plan trach 5/5 Intermittent CXR Daily SBT / WUA   NEUROLOGIC A:   Acute encephalopathy > improving slowly Baseline mental illness, poorly understood, multiple medications at home Chronic pain  P:   RASS goal: -1  Continue fentanyl gtt Continue seroquel 150 BID and follow QTc (538 on 5/3) interval closely, titrate as able PAD protocol Continue cymbalta, buspar  CARDIOVASCULAR A:  S/p CABG and CEA P:  Per vascular and TCTS Monitor hemodynamics Continue lasix  RENAL A:   AKI improving P:   Monitor BMET and UOP Scheduled Lasix 40 mg bid per TCTS Replace electrolytes as needed    GASTROINTESTINAL A:   Morbid Obesity  P:   Cortrak placed 5/1, continue TF NGT clampled PPI Nutrition per surgery  HEMATOLOGIC A:   Anemia, no bleeding Thrombocytopenia - overall improved P:  5/5 HgB 7.7 transfuse 1 unit pRBC's Transfusion per usual ICU guidelines Monitor platelets   INFECTIOUS A:   Fever post op, normal WBC so likely atelectasis.  No infiltrate on CXR 5/5 P:   Monitor off of  antibiotics  ENDOCRINE A:   Hyperglycemia P:   Continue home liraglutide, SSI, levemir    FAMILY  - Updates: No family at bedside to update. The patients mother passed away 11/04/2022 (he is unaware).    Sonda Rumble, AGNP  Pulmonary/Critical Care

## 2015-10-27 NOTE — Progress Notes (Signed)
CT surgery p.m. Rounds  1 week after redo CABG 3 with coronary endarterectomy of RCA with combined right carotid endarterectomy Patient has remained ventilator dependent--underwent percutaneous tracheostomy by CCM today Patient resting comfortably with tracheostomy without bleeding P.m. hemoglobin down to 7.2-Will give 1 unit of packed cells Resume tube feeds later this evening

## 2015-10-28 ENCOUNTER — Inpatient Hospital Stay (HOSPITAL_COMMUNITY): Payer: Medicare Other

## 2015-10-28 LAB — POCT I-STAT 3, ART BLOOD GAS (G3+)
Acid-Base Excess: 7 mmol/L — ABNORMAL HIGH (ref 0.0–2.0)
Bicarbonate: 34.6 mEq/L — ABNORMAL HIGH (ref 20.0–24.0)
O2 Saturation: 92 %
Patient temperature: 100.8
TCO2: 37 mmol/L (ref 0–100)
pCO2 arterial: 70.5 mmHg (ref 35.0–45.0)
pH, Arterial: 7.305 — ABNORMAL LOW (ref 7.350–7.450)
pO2, Arterial: 77 mmHg — ABNORMAL LOW (ref 80.0–100.0)

## 2015-10-28 LAB — COMPREHENSIVE METABOLIC PANEL
ALT: 23 U/L (ref 17–63)
AST: 23 U/L (ref 15–41)
Albumin: 2.2 g/dL — ABNORMAL LOW (ref 3.5–5.0)
Alkaline Phosphatase: 55 U/L (ref 38–126)
Anion gap: 11 (ref 5–15)
BUN: 31 mg/dL — ABNORMAL HIGH (ref 6–20)
CO2: 33 mmol/L — ABNORMAL HIGH (ref 22–32)
Calcium: 8.2 mg/dL — ABNORMAL LOW (ref 8.9–10.3)
Chloride: 96 mmol/L — ABNORMAL LOW (ref 101–111)
Creatinine, Ser: 1.42 mg/dL — ABNORMAL HIGH (ref 0.61–1.24)
GFR calc Af Amer: 56 mL/min — ABNORMAL LOW (ref >60)
GFR calc non Af Amer: 48 mL/min — ABNORMAL LOW (ref >60)
Glucose, Bld: 171 mg/dL — ABNORMAL HIGH (ref 65–99)
Potassium: 3.8 mmol/L (ref 3.5–5.1)
Sodium: 140 mmol/L (ref 135–145)
Total Bilirubin: 1.3 mg/dL — ABNORMAL HIGH (ref 0.3–1.2)
Total Protein: 5.7 g/dL — ABNORMAL LOW (ref 6.5–8.1)

## 2015-10-28 LAB — TYPE AND SCREEN
ABO/RH(D): O NEG
Antibody Screen: NEGATIVE
Unit division: 0
Unit division: 0

## 2015-10-28 LAB — POCT I-STAT, CHEM 8
BUN: 31 mg/dL — ABNORMAL HIGH (ref 6–20)
Calcium, Ion: 1.07 mmol/L — ABNORMAL LOW (ref 1.13–1.30)
Chloride: 96 mmol/L — ABNORMAL LOW (ref 101–111)
Creatinine, Ser: 1.3 mg/dL — ABNORMAL HIGH (ref 0.61–1.24)
Glucose, Bld: 154 mg/dL — ABNORMAL HIGH (ref 65–99)
HCT: 25 % — ABNORMAL LOW (ref 39.0–52.0)
Hemoglobin: 8.5 g/dL — ABNORMAL LOW (ref 13.0–17.0)
Potassium: 3.5 mmol/L (ref 3.5–5.1)
Sodium: 140 mmol/L (ref 135–145)
TCO2: 30 mmol/L (ref 0–100)

## 2015-10-28 LAB — GLUCOSE, CAPILLARY
Glucose-Capillary: 163 mg/dL — ABNORMAL HIGH (ref 65–99)
Glucose-Capillary: 152 mg/dL — ABNORMAL HIGH (ref 65–99)
Glucose-Capillary: 167 mg/dL — ABNORMAL HIGH (ref 65–99)
Glucose-Capillary: 136 mg/dL — ABNORMAL HIGH (ref 65–99)
Glucose-Capillary: 146 mg/dL — ABNORMAL HIGH (ref 65–99)

## 2015-10-28 LAB — CBC
HCT: 26.9 % — ABNORMAL LOW (ref 39.0–52.0)
Hemoglobin: 8.3 g/dL — ABNORMAL LOW (ref 13.0–17.0)
MCH: 28.9 pg (ref 26.0–34.0)
MCHC: 30.9 g/dL (ref 30.0–36.0)
MCV: 93.7 fL (ref 78.0–100.0)
Platelets: 168 10*3/uL (ref 150–400)
RBC: 2.87 MIL/uL — ABNORMAL LOW (ref 4.22–5.81)
RDW: 16 % — ABNORMAL HIGH (ref 11.5–15.5)
WBC: 4.8 10*3/uL (ref 4.0–10.5)

## 2015-10-28 LAB — CARBOXYHEMOGLOBIN
Carboxyhemoglobin: 1.6 % — ABNORMAL HIGH (ref 0.5–1.5)
Methemoglobin: 1 % (ref 0.0–1.5)
O2 Saturation: 70.9 %
Total hemoglobin: 6.9 g/dL — CL (ref 13.5–18.0)

## 2015-10-28 MED ORDER — ACETAMINOPHEN 10 MG/ML IV SOLN
1000.0000 mg | Freq: Four times a day (QID) | INTRAVENOUS | Status: AC
  Administered 2015-10-28: 1000 mg via INTRAVENOUS
  Filled 2015-10-28: qty 100

## 2015-10-28 MED ORDER — METOCLOPRAMIDE HCL 5 MG/ML IJ SOLN
10.0000 mg | Freq: Four times a day (QID) | INTRAMUSCULAR | Status: DC
  Administered 2015-10-28 – 2015-11-07 (×39): 10 mg via INTRAVENOUS
  Filled 2015-10-28 (×38): qty 2

## 2015-10-28 MED ORDER — VANCOMYCIN HCL IN DEXTROSE 1-5 GM/200ML-% IV SOLN
1000.0000 mg | Freq: Two times a day (BID) | INTRAVENOUS | Status: DC
  Administered 2015-10-28 – 2015-10-31 (×7): 1000 mg via INTRAVENOUS
  Filled 2015-10-28 (×9): qty 200

## 2015-10-28 MED ORDER — PIPERACILLIN-TAZOBACTAM 3.375 G IVPB 30 MIN
3.3750 g | Freq: Once | INTRAVENOUS | Status: AC
  Administered 2015-10-28: 3.375 g via INTRAVENOUS
  Filled 2015-10-28: qty 50

## 2015-10-28 MED ORDER — FUROSEMIDE 10 MG/ML IJ SOLN: 40.0000 mg | Freq: Every day | INTRAMUSCULAR | Status: DC

## 2015-10-28 MED ORDER — PIPERACILLIN-TAZOBACTAM 3.375 G IVPB 30 MIN
3.3750 g | Freq: Three times a day (TID) | INTRAVENOUS | Status: DC
  Administered 2015-10-28 – 2015-10-31 (×9): 3.375 g via INTRAVENOUS
  Filled 2015-10-28 (×12): qty 50

## 2015-10-28 MED ORDER — PIPERACILLIN-TAZOBACTAM 4.5 G IVPB: 4.5000 g | Freq: Three times a day (TID) | INTRAVENOUS | Status: DC

## 2015-10-28 MED ORDER — PIPERACILLIN-TAZOBACTAM 4.5 G IVPB
4.5000 g | Freq: Three times a day (TID) | INTRAVENOUS | Status: DC
  Filled 2015-10-28 (×3): qty 100

## 2015-10-28 MED ORDER — POTASSIUM CHLORIDE 10 MEQ/50ML IV SOLN
10.0000 meq | INTRAVENOUS | Status: AC
  Administered 2015-10-28 (×3): 10 meq via INTRAVENOUS
  Filled 2015-10-28: qty 50

## 2015-10-28 NOTE — Progress Notes (Signed)
8 Days Post-Op Procedure(s) (LRB): REDO CORONARY ARTERY BYPASS GRAFTING (CABG), TIMES THREE, USING LEFT GREATER SAPHENOUS VEIN HARVESTED ENDOSCOPICALLY AND CRYO VEIN, WITH CORONARY ENDARTERECTOMY (N/A) TRANSESOPHAGEAL ECHOCARDIOGRAM (TEE) (N/A) ENDARTERECTOMY CAROTID (Right) Subjective: 8 days after redo CABG Patient remains on ventilator for acute on chronic respiratory failure-history of COPD and OSA Febrile 102 this a.m.-possible aspiration. We'll hold tube feeds    And panculture. Start broad-spectrum antibiotics pending    culture results Maintaining sinus rhythm Chest x-ray stable after tracheostomy yesterday Surgical incisions clean and dry Mixed venous saturation remains normal  Objective: Vital signs in last 24 hours: Temp:  [98.2 F (36.8 C)-102.8 F (39.3 C)] 102.8 F (39.3 C) (05/06 0830) Pulse Rate:  [87-173] 91 (05/06 0755) Cardiac Rhythm:  [-] Normal sinus rhythm (05/06 0000) Resp:  [12-22] 18 (05/06 0755) BP: (77-170)/(32-71) 121/53 mmHg (05/06 0700) SpO2:  [82 %-100 %] 96 % (05/06 0755) FiO2 (%):  [60 %-100 %] 60 % (05/06 0755) Weight:  [292 lb 15.9 oz (132.9 kg)] 292 lb 15.9 oz (132.9 kg) (05/06 0500)  Hemodynamic parameters for last 24 hours:  febrile 102, sinus rhythm  Intake/Output from previous day: 05/05 0701 - 05/06 0700 In: 2462.1 [I.V.:943.1; Blood:574; NG/GT:795; IV Piggyback:150] Out: 2795 [Urine:2795] Intake/Output this shift:         Exam    General- alert and comfortable   Lungs- clear without rales, wheezes   Cor- regular rate and rhythm, no murmur , gallop   Abdomen- soft, non-tender   Extremities - warm, non-tender, minimal edema   Neuro- oriented, appropriate, no focal weakness   Lab Results:  Recent Labs  10/27/15 0430 10/27/15 1700 10/28/15 0400  WBC 5.8  --  4.8  HGB 7.7* 7.5* 8.3*  HCT 24.4* 22.0* 26.9*  PLT 177  --  168   BMET:  Recent Labs  10/27/15 0430 10/27/15 1700 10/28/15 0400  NA 141 140 140  K 3.6 3.5  3.8  CL 95* 96* 96*  CO2 32  --  33*  GLUCOSE 181* 130* 171*  BUN 30* 28* 31*  CREATININE 1.48* 1.40* 1.42*  CALCIUM 8.5*  --  8.2*    PT/INR: No results for input(s): LABPROT, INR in the last 72 hours. ABG    Component Value Date/Time   PHART 7.305* 10/28/2015 0527   HCO3 34.6* 10/28/2015 0527   TCO2 37 10/28/2015 0527   ACIDBASEDEF 1.0 10/20/2015 1824   O2SAT 92.0 10/28/2015 0527   CBG (last 3)   Recent Labs  10/27/15 2324 10/28/15 0401 10/28/15 0814  GLUCAP 179* 163* 152*    Assessment/Plan: S/P Procedure(s) (LRB): REDO CORONARY ARTERY BYPASS GRAFTING (CABG), TIMES THREE, USING LEFT GREATER SAPHENOUS VEIN HARVESTED ENDOSCOPICALLY AND CRYO VEIN, WITH CORONARY ENDARTERECTOMY (N/A) TRANSESOPHAGEAL ECHOCARDIOGRAM (TEE) (N/A) ENDARTERECTOMY CAROTID (Right) Diuresis Diabetes control Vent wean per CCM. Patient may need exchange of tracheostomy because of airleak with cuff  at normal pressure   LOS: 12 days    Kathlee Nationseter Van Trigt III 10/28/2015

## 2015-10-28 NOTE — Progress Notes (Signed)
CT surgery p.m. Rounds  Persistent fever today Pancultured and antibiotics started IV acetaminophen and is ordered Resume tube feeds after being held for one episode of vomiting and possible aspiration

## 2015-10-28 NOTE — Progress Notes (Signed)
Name: Tyler DeutscherDwight E Byrns MRN: [id number] DOB: Mar 14, 1946    ADMISSION DATE:  10/16/2015 CONSULTATION DATE:  10/18/15  REFERRING MD : Donata ClayVan Trigt, MD  CHIEF COMPLAINT:  Hypercapnia   SUBJECTIVE:    Awake, no distress. Currently on full support.   VITAL SIGNS: Temp:  [98.2 F (36.8 C)-100.8 F (38.2 C)] 99.5 F (37.5 C) (05/06 0600) Pulse Rate:  [87-173] 91 (05/06 0755) Resp:  [12-22] 18 (05/06 0755) BP: (77-170)/(32-71) 121/53 mmHg (05/06 0700) SpO2:  [82 %-100 %] 96 % (05/06 0755) FiO2 (%):  [50 %-100 %] 60 % (05/06 0755) Weight:  [292 lb 15.9 oz (132.9 kg)] 292 lb 15.9 oz (132.9 kg) (05/06 0500)  Intake/Output Summary (Last 24 hours) at 10/28/15 0833 Last data filed at 10/28/15 [id number]  Gross per 24 hour  Intake 2412.13 ml  Output   2705 ml  Net -292.87 ml   PHYSICAL EXAMINATION: General:  Obese male; awake, not in distress.  HENT: NCAT; trach unremarkable  PULM: non labored breathing, vent supported breaths, decreased bases CV: RRR, no clear murmur GI: BS+, soft MSK: normal bulk and tone, moves all extremities Neuro: intermittently follows commands on sedation, intermittent agitation   Recent Labs Lab 10/26/15 0457  10/27/15 0430 10/27/15 1700 10/28/15 0400  NA 137  < > 141 140 140  K 3.7  < > 3.6 3.5 3.8  CL 99*  < > 95* 96* 96*  CO2 28  --  32  --  33*  BUN 29*  < > 30* 28* 31*  CREATININE 1.48*  < > 1.48* 1.40* 1.42*  GLUCOSE 213*  < > 181* 130* 171*  < > = values in this interval not displayed.  Recent Labs Lab 10/26/15 0457  10/27/15 0430 10/27/15 1700 10/28/15 0400  HGB 7.7*  < > 7.7* 7.5* 8.3*  HCT 24.2*  < > 24.4* 22.0* 26.9*  WBC 5.1  --  5.8  --  4.8  PLT 142*  --  177  --  168  < > = values in this interval not displayed. Dg Chest Port 1 View  10/28/2015  CLINICAL DATA:  Status post CABG x4 EXAM: PORTABLE CHEST 1 VIEW COMPARISON:  Yesterday FINDINGS: A feeding tube at least reaches the diaphragm. There is a tracheostomy tube which is well  seated. Bilateral subclavian central access in stable position, with right upper extremity PICC tip at the right atrial level. Stable widened cardiomediastinal silhouette in this patient status post CABG. Bibasilar atelectasis is similar to prior. No visible pneumothorax. No suspected edema. IMPRESSION: 1. Stable positioning of tubes and lines. 2. Unchanged basilar atelectasis. Electronically Signed   By: Marnee SpringJonathon  Watts M.D.   On: 10/28/2015 07:10   Dg Chest Port 1 View  10/27/2015  CLINICAL DATA:  Tracheostomy EXAM: PORTABLE CHEST 1 VIEW COMPARISON:  10/27/2015 FINDINGS: Tracheostomy tube as been placed. The tip is 6.1 cm from the carina. Right upper extremity PICC stable. NG tube removed. Feeding tube remains in place. Cardiac silhouette remains prominent. Bibasilar hazy opacity likely a combination of pleural fluid and volume loss is stable. No pneumothorax. IMPRESSION: Tracheostomy tube placed as described Stable bibasilar opacity likely a combination of pleural fluid and volume loss. Electronically Signed   By: Jolaine ClickArthur  Hoss M.D.   On: 10/27/2015 13:53   Dg Chest Port 1 View  10/27/2015  CLINICAL DATA:  Shortness of breath, Coronary artery disease and CABG, former heavy smoker. EXAM: PORTABLE CHEST 1 VIEW COMPARISON:  Portable chest x-ray of Oct 26, 2015 FINDINGS: The lungs are mildly hypoinflated. There is persistent increased density in the retrocardiac region on the left and at the right lung base medially. The left hemidiaphragm is obscured. The cardiac silhouette is enlarged. The pulmonary vascularity is not engorged. The endotracheal tube tip lies approximately 5.6 cm above the carina. The esophagogastric tube tip projects below the inferior margin of the image. The right PICC line tip projects over the distal third of the SVC. IMPRESSION: Persistent cardiomegaly without significant pulmonary vascular congestion today. Persistent bibasilar atelectasis greater on the left. Small left pleural effusion as  well. The support tubes are in reasonable position. Electronically Signed   By: David  [place] M.D.   On: 10/27/2015 07:35  CXR w/ improved aeration   STUDIES:  PFT 4/25 >> restrictive lung dz, low DLCO diffusion LE Doppler 5/2 >>  negative  CULTURES: Sputum 5/5 >>few yeast consistent with candida species Sputum 5/6>>> BC 5/6>>>  ANTIBIOTICS: Cefuroxime 4/28 >> 4/30 vanc 5/6>> Zosyn 5/6>>>  SIGNIFICANT EVENTS: 4/28 CABG and CEA 5/01 Bradycardia / hypotension with any weaning of sedation 5/02 Episodes of desaturation, increased O2 needs 5/03 Weaned off dopamine, neo, sedation reduced 5/5 trach.  5/6 fevers, still on fio2 60%/peep 8. ABX started  LINES/TUBES: 4/28  midine R arm >>5/1  4/28  L subclavian introducer >  4/28  R neck drain >> 4/30 4/28  mediastinal chest tubes >> 4/30 5/01  PICC >> Trach 5/5 Ninetta Lights)  ASSESSMENT / PLAN:   DISCUSSION: 70 y/o male with obesity hypoventilation syndrome who underwent a redo CABG and CEA on 4/28. Remains on vent post-operatively - anxiety/encephalopathy, bradycardia/hypotension as barriers to extubation but these are improving. S/p trach 5/5. Will cont to wean fent, FIO2 and provide supportive care. He did spike new fevers again on 5/6. Primary svc has sent blood and sputum cultures and started empiric nosocomial coverage.   ASSESSMENT / PLAN:  PULMONARY A: Vent dependence  Chronic respiratory failure with hypercapnea > improving Obesity hypoventilation syndrome Tracheostomy status. S/p trach 5/5 ->having some difficulty w/ trach and losing Vt. May need to replace w/ prox XLT Monday  P:   Cont full vent support Daily assessment for SBT / WUA; once we get his FIO2 <50% could start PS trials even on Peep of 8  Initially we will plan on nocturnal ventilation; but eventually will need to determine if he needs this at d/c Limit sedation  Mobilize as able No plans for decannulation at this point.   NEUROLOGIC A:   Acute  encephalopathy > improving slowly Baseline mental illness, poorly understood, multiple medications at home Chronic pain  P:   RASS goal: now 0 to -1 Continue fentanyl gtt-->will attempt to wean  Continue seroquel 150 BID and follow QTc (467 on 5/6) interval closely, titrate as able PAD protocol Continue cymbalta, buspar  CARDIOVASCULAR A:  S/p CABG and CEA P:  Per vascular and TCTS Monitor hemodynamics Continue lasix as BUN/creatinine will allow   RENAL A:   AKI seems to have hit plateau  P:   Monitor BMET and UOP Scheduled Lasix 40 mg bid per TCTS; (may need to back this down IF creatinine bumps again in 5/7 Replace electrolytes as needed    GASTROINTESTINAL A:   Morbid Obesity  P:   Cortrak placed 5/1, continue TF PPI Nutrition per surgery  HEMATOLOGIC A:   Anemia, no bleeding 5/5 HgB 7.7 transfuse 1 unit pRBC's (per surgical svc) Thrombocytopenia - overall improved P:  Transfusion per usual  ICU guidelines Monitor platelets   INFECTIOUS A:   Recurrent fever 5/6, normal WBC;  No infiltrate on CXR 5/6 P:   Sputum and blood sent 5/6 Vanc/zosyn 5/6>>>  ENDOCRINE A:   Hyperglycemia P:   Continue home liraglutide, SSI, levemir   FAMILY  - Updates: No family at bedside to update. The patients mother passed away Nov 09, 2022 (he is unaware).    Simonne Martinet ACNP-BC [facility] Pulmonary/Critical Care Pager # [phone] OR # [phone] if no answer    STAFF NOTE: I, Rory Percy, MD FACP have personally reviewed patient's available data, including medical history, events of note, physical examination and test results as part of my evaluation. I have discussed with resident/NP and other care providers such as pharmacist, RN and RRT. In addition, I personally evaluated patient and elicited key findings of: calm, reduced BS, trach dry overall, pcxr small ung volumes, was neg last 24 hrs, abg this am noted ph 7.30 an fio2 60%, unlikely to benefit from SBT today, hope to  reduce and dc fent drip and then have more alertness and wean in am PS 10, cpap 8, that goal of weaning would be for am, he has a baseline co2 now of about 62, would like to normalize ph to 7.36 on rest, may need MV increase, pcxr needed in am, i updated wife The patient is critically ill with multiple organ systems failure and requires high complexity decision making for assessment and support, frequent evaluation and titration of therapies, application of advanced monitoring technologies and extensive interpretation of multiple databases.   Critical Care Time devoted to patient care services described in this note is 30 Minutes. This time reflects time of care of this signee: Rory Percy, MD FACP. This critical care time does not reflect procedure time, or teaching time or supervisory time of PA/NP/Med student/Med Resident etc but could involve care discussion time. Rest per NP/medical resident whose note is outlined above and that I agree with   Mcarthur Rossetti. Tyson Alias, MD, FACP Pgr: [phone] [facility] 10/28/2015 12:35 PM

## 2015-10-28 NOTE — Progress Notes (Signed)
RRT called to assess pt's trach d/t increased vent dysynchrony (was double-stacking breaths occasionally, then began doing it with every breath around 0045 w/lowered volumes 300-450) despite adequate sedation and suctioning. Cuff pressure adequate, but most likely an air leak. Pt likely needs XLT trach rather than current [id number] d/t anatomy.   RRT came to assess and troubleshoot, suctioned pt out again. Now only doublestacking occasionally as before, pulling good volumes in 600-700s now. Will continue to monitor.  Peyton Bottomsachel R Filomena Pokorney, RN 12:57 AM 10/28/2015

## 2015-10-28 NOTE — Progress Notes (Signed)
CRITICAL VALUE ALERT  Critical value received:  Hgb 6.9 (from co-ox), pCO2 70.5  Date of notification:  10/28/2015  Time of notification:  05:00  Critical value read back:Yes.    Nurse who received alert:  Kennith Centerachel Aden Youngman, RN  MD notified (1st page):  Donata ClayVan Trigt  Time of first page:  05:50  Responding MD:  Donata ClayVan Trigt  Time MD responded:  05:55  Discussed critical values and related lab values with MD. Co-ox Hgb was 6.9, but CBC Hgb was 8.3, so no concern there. ABG showed acidosis with pCO2 70.5. Pt has had issues with air leak around trach cuff despite RRT troubleshooting it earlier. MD said to inflate cuff as much as needed to stop the leak and to increase the RR to 18rpm.   Air leak no longer audible after inflating cuff more, and vent settings changed to reflect RR of 18. Pt currently has good color and O2sat 93%. Will continue to monitor.  Peyton Bottomsachel R Jamita Mckelvin, RN 6:02 AM 10/28/2015

## 2015-10-29 ENCOUNTER — Inpatient Hospital Stay (HOSPITAL_COMMUNITY): Payer: Medicare Other

## 2015-10-29 DIAGNOSIS — T85598A Other mechanical complication of other gastrointestinal prosthetic devices, implants and grafts, initial encounter: Secondary | ICD-10-CM | POA: Insufficient documentation

## 2015-10-29 DIAGNOSIS — J9811 Atelectasis: Secondary | ICD-10-CM | POA: Insufficient documentation

## 2015-10-29 LAB — CARBOXYHEMOGLOBIN
Carboxyhemoglobin: 1.2 % (ref 0.5–1.5)
Methemoglobin: 1 % (ref 0.0–1.5)
O2 Saturation: 67.6 %
Total hemoglobin: 9.8 g/dL — ABNORMAL LOW (ref 13.5–18.0)

## 2015-10-29 LAB — COMPREHENSIVE METABOLIC PANEL
ALT: 36 U/L (ref 17–63)
AST: 40 U/L (ref 15–41)
Albumin: 2.2 g/dL — ABNORMAL LOW (ref 3.5–5.0)
Alkaline Phosphatase: 64 U/L (ref 38–126)
Anion gap: 13 (ref 5–15)
BUN: 29 mg/dL — ABNORMAL HIGH (ref 6–20)
CO2: 30 mmol/L (ref 22–32)
Calcium: 8.5 mg/dL — ABNORMAL LOW (ref 8.9–10.3)
Chloride: 98 mmol/L — ABNORMAL LOW (ref 101–111)
Creatinine, Ser: 1.61 mg/dL — ABNORMAL HIGH (ref 0.61–1.24)
GFR calc Af Amer: 48 mL/min — ABNORMAL LOW (ref >60)
GFR calc non Af Amer: 41 mL/min — ABNORMAL LOW (ref >60)
Glucose, Bld: 142 mg/dL — ABNORMAL HIGH (ref 65–99)
Potassium: 3.6 mmol/L (ref 3.5–5.1)
Sodium: 141 mmol/L (ref 135–145)
Total Bilirubin: 2.1 mg/dL — ABNORMAL HIGH (ref 0.3–1.2)
Total Protein: 6.2 g/dL — ABNORMAL LOW (ref 6.5–8.1)

## 2015-10-29 LAB — POCT I-STAT, CHEM 8
BUN: 25 mg/dL — ABNORMAL HIGH (ref 6–20)
Calcium, Ion: 1.07 mmol/L — ABNORMAL LOW (ref 1.13–1.30)
Chloride: 91 mmol/L — ABNORMAL LOW (ref 101–111)
Creatinine, Ser: 1.1 mg/dL (ref 0.61–1.24)
Glucose, Bld: 190 mg/dL — ABNORMAL HIGH (ref 65–99)
HCT: 23 % — ABNORMAL LOW (ref 39.0–52.0)
Hemoglobin: 7.8 g/dL — ABNORMAL LOW (ref 13.0–17.0)
Potassium: 3.3 mmol/L — ABNORMAL LOW (ref 3.5–5.1)
Sodium: 134 mmol/L — ABNORMAL LOW (ref 135–145)
TCO2: 27 mmol/L (ref 0–100)

## 2015-10-29 LAB — URINALYSIS, ROUTINE W REFLEX MICROSCOPIC
Glucose, UA: NEGATIVE mg/dL
KETONES UR: NEGATIVE mg/dL
LEUKOCYTES UA: NEGATIVE
NITRITE: NEGATIVE
PROTEIN: 30 mg/dL — AB
Specific Gravity, Urine: 1.025 (ref 1.005–1.030)
pH: 6 (ref 5.0–8.0)

## 2015-10-29 LAB — GLUCOSE, CAPILLARY
Glucose-Capillary: 174 mg/dL — ABNORMAL HIGH (ref 65–99)
Glucose-Capillary: 140 mg/dL — ABNORMAL HIGH (ref 65–99)
Glucose-Capillary: 136 mg/dL — ABNORMAL HIGH (ref 65–99)
Glucose-Capillary: 174 mg/dL — ABNORMAL HIGH (ref 65–99)
Glucose-Capillary: 155 mg/dL — ABNORMAL HIGH (ref 65–99)
Glucose-Capillary: 155 mg/dL — ABNORMAL HIGH (ref 65–99)

## 2015-10-29 LAB — CBC
HCT: 26.1 % — ABNORMAL LOW (ref 39.0–52.0)
Hemoglobin: 8.4 g/dL — ABNORMAL LOW (ref 13.0–17.0)
MCH: 29.6 pg (ref 26.0–34.0)
MCHC: 32.2 g/dL (ref 30.0–36.0)
MCV: 91.9 fL (ref 78.0–100.0)
Platelets: 169 10*3/uL (ref 150–400)
RBC: 2.84 MIL/uL — ABNORMAL LOW (ref 4.22–5.81)
RDW: 15 % (ref 11.5–15.5)
WBC: 6.5 10*3/uL (ref 4.0–10.5)

## 2015-10-29 LAB — URINE MICROSCOPIC-ADD ON
Bacteria, UA: NONE SEEN
WBC UA: NONE SEEN WBC/hpf (ref 0–5)

## 2015-10-29 LAB — PROCALCITONIN: Procalcitonin: 0.19 ng/mL

## 2015-10-29 LAB — CORTISOL-AM, BLOOD: Cortisol - AM: 19.6 ug/dL (ref 6.7–22.6)

## 2015-10-29 MED ORDER — POTASSIUM CHLORIDE 10 MEQ/50ML IV SOLN
10.0000 meq | INTRAVENOUS | Status: AC
  Administered 2015-10-29 (×3): 10 meq via INTRAVENOUS
  Filled 2015-10-29: qty 50

## 2015-10-29 MED ORDER — POTASSIUM CHLORIDE 20 MEQ/15ML (10%) PO SOLN
40.0000 meq | Freq: Once | ORAL | Status: AC
  Administered 2015-10-29: 40 meq
  Filled 2015-10-29: qty 30

## 2015-10-29 MED ORDER — POTASSIUM CHLORIDE 10 MEQ/50ML IV SOLN: 10.0000 meq | INTRAVENOUS | Status: DC | PRN

## 2015-10-29 MED ORDER — FENTANYL CITRATE (PF) 100 MCG/2ML IJ SOLN
25.0000 ug | INTRAMUSCULAR | Status: DC | PRN
  Administered 2015-10-31 – 2015-11-03 (×5): 50 ug via INTRAVENOUS
  Filled 2015-10-29 (×5): qty 2

## 2015-10-29 NOTE — Progress Notes (Signed)
9 Days Post-Op Procedure(s) (LRB): REDO CORONARY ARTERY BYPASS GRAFTING (CABG), TIMES THREE, USING LEFT GREATER SAPHENOUS VEIN HARVESTED ENDOSCOPICALLY AND CRYO VEIN, WITH CORONARY ENDARTERECTOMY (N/A) TRANSESOPHAGEAL ECHOCARDIOGRAM (TEE) (N/A) ENDARTERECTOMY CAROTID (Right) Subjective: Patient overall appears better today Fever has resolved Agitation-delirium improved Starting to tolerate pressure support weaning trials Cultures still negative-continue vancomycin and Zosyn Tube feeds being advanced again Chest x-ray and KUB are satisfactory this a.m. Holding Lasix for euvolemic status  Objective: Vital signs in last 24 hours: Temp:  [99 F (37.2 C)-99.6 F (37.6 C)] 99.6 F (37.6 C) (05/07 0750) Pulse Rate:  [87-115] 88 (05/07 1212) Cardiac Rhythm:  [-] Normal sinus rhythm;Sinus tachycardia (05/07 0800) Resp:  [5-33] 22 (05/07 1212) BP: (87-182)/(40-79) 101/49 mmHg (05/07 1100) SpO2:  [89 %-100 %] 100 % (05/07 1212) FiO2 (%):  [50 %-60 %] 50 % (05/07 1212) Weight:  [296 lb 11.8 oz (134.6 kg)] 296 lb 11.8 oz (134.6 kg) (05/07 0500)  Hemodynamic parameters for last 24 hours:  stable  Intake/Output from previous day: 05/06 0701 - 05/07 0700 In: 2125 [I.V.:960; NG/GT:615; IV Piggyback:550] Out: 2015 [Urine:2015] Intake/Output this shift: Total I/O In: 580.5 [I.V.:82.5; NG/GT:148; IV Piggyback:350] Out: 85 [Urine:85]       Exam    General-  Comfortable on trach with sedation   Lungs- clear without rales, wheezes   Cor- regular rate and rhythm, no murmur , gallop   Abdomen- soft, non-tender   Extremities - warm, non-tender, minimal edema   Neuro- oriented, appropriate, no focal weakness   Lab Results:  Recent Labs  10/28/15 0400 10/28/15 1612 10/29/15 0355  WBC 4.8  --  6.5  HGB 8.3* 8.5* 8.4*  HCT 26.9* 25.0* 26.1*  PLT 168  --  169   BMET:  Recent Labs  10/28/15 0400 10/28/15 1612 10/29/15 0355  NA 140 140 141  K 3.8 3.5 3.6  CL 96* 96* 98*  CO2  33*  --  30  GLUCOSE 171* 154* 142*  BUN 31* 31* 29*  CREATININE 1.42* 1.30* 1.61*  CALCIUM 8.2*  --  8.5*    PT/INR: No results for input(s): LABPROT, INR in the last 72 hours. ABG    Component Value Date/Time   PHART 7.305* 10/28/2015 0527   HCO3 34.6* 10/28/2015 0527   TCO2 30 10/28/2015 1612   ACIDBASEDEF 1.0 10/20/2015 1824   O2SAT 67.6 10/29/2015 0400   CBG (last 3)   Recent Labs  10/29/15 0023 10/29/15 0412 10/29/15 0743  GLUCAP 174* 140* 136*    Assessment/Plan: S/P Procedure(s) (LRB): REDO CORONARY ARTERY BYPASS GRAFTING (CABG), TIMES THREE, USING LEFT GREATER SAPHENOUS VEIN HARVESTED ENDOSCOPICALLY AND CRYO VEIN, WITH CORONARY ENDARTERECTOMY (N/A) TRANSESOPHAGEAL ECHOCARDIOGRAM (TEE) (N/A) ENDARTERECTOMY CAROTID (Right) Mobilize Adequate glucose control, advanced to feeds to 60 Patient started physical therapy CCM to direct vent wean- input appreciated  LOS: 13 days    Kathlee Nationseter Van Trigt III 10/29/2015

## 2015-10-29 NOTE — Progress Notes (Signed)
CT surgery p.m. Rounds  Patient had excellent day much more alert and interactive Tolerating pressure support vent weaning on tracheostomy Stable urine output Fever resolved Blood urine and sputum cultures negative so far Continue IV vancomycin and Zosyn Continue with vent wean as tolerated Tube feeds increased to goal at 60 cc/h

## 2015-10-29 NOTE — Evaluation (Signed)
Physical Therapy Evaluation Patient Details Name: Tyler Le MRN: [id number] DOB: [birth year] Today's Date: 10/29/2015   History of Present Illness  pt presents with Cardiac Arrest and Redo of CABG x3.  pt intubated 4/24 and with Trach on 5/5.  pt with hx of CABG, Anxiety, HTN, DM, CAD, Renal Insufficiency, and Back Surgeries.    Clinical Impression  Pt does attempt to participate in mobility and will nod head yes/no to questions.  Pt at this time requires extensive A for all mobility and feel pt will need extensive rehab at SNF vs [facility] pending pt's ability to wean and his insurance benefits.  Will continue to follow while on acute.      Follow Up Recommendations SNF (vs Lt[facility] pending benefits)    Equipment Recommendations  None recommended by PT    Recommendations for Other Services       Precautions / Restrictions Precautions Precautions: Fall;Sternal Precaution Comments: Trach, Vent Restrictions Weight Bearing Restrictions: No      Mobility  Bed Mobility Overal bed mobility: Needs Assistance;+2 for physical assistance Bed Mobility: Rolling;Sidelying to Sit;Sit to Supine Rolling: Max assist Sidelying to sit: Max assist;HOB elevated   Sit to supine: Max assist;+2 for physical assistance   General bed mobility comments: pt does attempt to follow cues, but needs continued cueing for maintaining sternal precautions.  RN present to A with returning to bed.    Transfers                    Ambulation/Gait                Stairs            Wheelchair Mobility    Modified Rankin (Stroke Patients Only)       Balance Overall balance assessment: Needs assistance Sitting-balance support: Bilateral upper extremity supported;Feet supported Sitting balance-[name] Scale: Poor Sitting balance - Comments: pt needs consistent ModA to maintain balance.   Postural control: Posterior lean                                   Pertinent  Vitals/Pain Pain Assessment: Faces Faces Pain Scale: Hurts little more Pain Location: Indicated his L hip.   Pain Descriptors / Indicators: Grimacing Pain Intervention(s): Monitored during session;Premedicated before session;Repositioned (RN present during session)    Home Living Family/patient expects to be discharged to:: Unsure Living Arrangements: Spouse/significant other                    Prior Function Level of Independence: Independent               Hand Dominance        Extremity/Trunk Assessment   Upper Extremity Assessment: Defer to OT evaluation           Lower Extremity Assessment: Generalized weakness      Cervical / Trunk Assessment: Normal  Communication   Communication: Tracheostomy  Cognition Arousal/Alertness: Awake/alert Behavior During Therapy: Flat affect Overall Cognitive Status: Difficult to assess                      General Comments      Exercises        Assessment/Plan    PT Assessment Patient needs continued PT services  PT Diagnosis Difficulty walking;Generalized weakness   PT Problem List Decreased strength;Decreased activity tolerance;Decreased balance;Decreased mobility;Decreased coordination;Decreased knowledge of use of  DME;Decreased knowledge of precautions;Cardiopulmonary status limiting activity;Obesity;Pain  PT Treatment Interventions DME instruction;Gait training;Functional mobility training;Therapeutic activities;Therapeutic exercise;Balance training;Patient/family education   PT Goals (Current goals can be found in the Care Plan section) Acute Rehab PT Goals Patient Stated Goal: Per wife to recover and come home. PT Goal Formulation: With patient/family Time For Goal Achievement: 11/12/15 Potential to Achieve Goals: Good    Frequency Min 3X/week   Barriers to discharge        Co-evaluation               End of Session Equipment Utilized During Treatment:  (Vent) Activity  Tolerance: Patient tolerated treatment well Patient left: in bed;with call bell/phone within reach;with family/visitor present;with nursing/sitter in room Nurse Communication: Mobility status;Need for lift equipment         Time: 1010-1034 PT Time Calculation (min) (ACUTE ONLY): 24 min   Charges:   PT Evaluation $PT Eval Moderate Complexity: 1 Procedure PT Treatments $Therapeutic Activity: 8-22 mins   PT G CodesSunny Le, Tyler Le [phone] 10/29/2015, 12:54 PM

## 2015-10-29 NOTE — Progress Notes (Signed)
eLink Physician Progress Note and Electrolyte Replacement  Patient Name: Tyler Le DOB: 17-Oct-1945 MRN: [id number]  Date of Service  10/29/2015   HPI/Events of Note    Recent Labs Lab 10/25/15 0440  10/26/15 0457  10/27/15 0430 10/27/15 1700 10/28/15 0400 10/28/15 1612 10/29/15 0355  NA 135  < > 137  < > 141 140 140 140 141  K 3.8  < > 3.7  < > 3.6 3.5 3.8 3.5 3.6  CL 99*  < > 99*  < > 95* 96* 96* 96* 98*  CO2 26  --  28  --  32  --  33*  --  30  GLUCOSE 164*  < > 213*  < > 181* 130* 171* 154* 142*  BUN 28*  < > 29*  < > 30* 28* 31* 31* 29*  CREATININE 1.59*  < > 1.48*  < > 1.48* 1.40* 1.42* 1.30* 1.61*  CALCIUM 7.6*  --  8.1*  --  8.5*  --  8.2*  --  8.5*  < > = values in this interval not displayed.  Estimated Creatinine Clearance: 62.6 mL/min (by C-G formula based on Cr of 1.61).  Intake/Output      05/06 0701 - 05/07 0700   I.V. (mL/kg) 800 (6)   NG/GT 405   IV Piggyback 500   Total Intake(mL/kg) 1705 (12.8)   Urine (mL/kg/hr) 1815 (0.6)   Total Output 1815   Net -110        - I/O DETAILED x 24h    Total I/O In: 610 [I.V.:320; NG/GT:240; IV Piggyback:50] Out: 485 [Urine:485] - I/O THIS SHIFT    ASSESSMENT k 3.6  eICURN Interventions  kcl 40meq   ASSESSMENT: MAJOR ELECTROLYTE      Dr. Kalman ShanMurali Fatimah Sundquist, M.D., [facility] HospitalF.C.C.P Pulmonary and Critical Care Medicine Staff Physician [facility] [place] Pulmonary and Critical Care Pager: [phone], If no answer or between  15:00h - 7:00h: call [phone]  10/29/2015 6:07 AM

## 2015-10-29 NOTE — Progress Notes (Addendum)
Name: Tyler Le MRN: [id number] DOB: May 28, 1946    ADMISSION DATE:  10/16/2015 CONSULTATION DATE:  10/18/15  REFERRING MD : Donata ClayVan Trigt, MD  CHIEF COMPLAINT:  Hypercapnia   SUBJECTIVE:    Awake, no distress. Currently on PSV  VITAL SIGNS: Temp:  [99 F (37.2 C)-102.8 F (39.3 C)] 99.6 F (37.6 C) (05/07 0750) Pulse Rate:  [77-115] 95 (05/07 0757) Resp:  [15-33] 16 (05/07 0757) BP: (87-182)/(37-79) 148/70 mmHg (05/07 0757) SpO2:  [83 %-100 %] 96 % (05/07 0757) FiO2 (%):  [50 %-60 %] 50 % (05/07 0757) Weight:  [296 lb 11.8 oz (134.6 kg)] 296 lb 11.8 oz (134.6 kg) (05/07 0500)  Intake/Output Summary (Last 24 hours) at 10/29/15 0818 Last data filed at 10/29/15 0600  Gross per 24 hour  Intake   1915 ml  Output   2015 ml  Net   -100 ml   PHYSICAL EXAMINATION: General:  Obese male; awake, not in distress. Cooperative and interactive  HENT: NCAT; trach unremarkable right neck staples intact  PULM: non labored breathing, vent supported breaths, decreased bases CV: RRR, no clear murmur GI: BS+, soft MSK: normal bulk and tone, moves all extremities Neuro: intermittently follows commands on sedation, intermittent agitation   Recent Labs Lab 10/27/15 0430  10/28/15 0400 10/28/15 1612 10/29/15 0355  NA 141  < > 140 140 141  K 3.6  < > 3.8 3.5 3.6  CL 95*  < > 96* 96* 98*  CO2 32  --  33*  --  30  BUN 30*  < > 31* 31* 29*  CREATININE 1.48*  < > 1.42* 1.30* 1.61*  GLUCOSE 181*  < > 171* 154* 142*  < > = values in this interval not displayed.  Recent Labs Lab 10/27/15 0430  10/28/15 0400 10/28/15 1612 10/29/15 0355  HGB 7.7*  < > 8.3* 8.5* 8.4*  HCT 24.4*  < > 26.9* 25.0* 26.1*  WBC 5.8  --  4.8  --  6.5  PLT 177  --  168  --  169  < > = values in this interval not displayed. Dg Chest Port 1 View  10/29/2015  CLINICAL DATA:  Patient status post CABG procedure. Shortness of breath. EXAM: PORTABLE CHEST 1 VIEW COMPARISON:  Chest radiograph 10/28/2015. FINDINGS:  Right-sided PICC line tip projects in the right atrium. Interval removal left subclavian CVC. Tracheostomy tube terminates in the mid trachea. Enteric tube courses inferior to the diaphragm. Stable cardiac and mediastinal contours status post median sternotomy. Low lung volumes. Bilateral airspace opacities. No pneumothorax. Surgical clips right cervical soft tissues. IMPRESSION: Bibasilar atelectasis. Stable tracheostomy tube. Electronically Signed   By: Annia Beltrew  Davis M.D.   On: 10/29/2015 08:05   Dg Chest Port 1 View  10/28/2015  CLINICAL DATA:  Status post CABG x4 EXAM: PORTABLE CHEST 1 VIEW COMPARISON:  Yesterday FINDINGS: A feeding tube at least reaches the diaphragm. There is a tracheostomy tube which is well seated. Bilateral subclavian central access in stable position, with right upper extremity PICC tip at the right atrial level. Stable widened cardiomediastinal silhouette in this patient status post CABG. Bibasilar atelectasis is similar to prior. No visible pneumothorax. No suspected edema. IMPRESSION: 1. Stable positioning of tubes and lines. 2. Unchanged basilar atelectasis. Electronically Signed   By: Marnee SpringJonathon  Watts M.D.   On: 10/28/2015 07:10   Dg Chest Port 1 View  10/27/2015  CLINICAL DATA:  Tracheostomy EXAM: PORTABLE CHEST 1 VIEW COMPARISON:  10/27/2015 FINDINGS: Tracheostomy tube  as been placed. The tip is 6.1 cm from the carina. Right upper extremity PICC stable. NG tube removed. Feeding tube remains in place. Cardiac silhouette remains prominent. Bibasilar hazy opacity likely a combination of pleural fluid and volume loss is stable. No pneumothorax. IMPRESSION: Tracheostomy tube placed as described Stable bibasilar opacity likely a combination of pleural fluid and volume loss. Electronically Signed   By: Jolaine Click M.D.   On: 10/27/2015 13:53   Dg Abd Portable 1v  10/29/2015  CLINICAL DATA:  Patient with history of ileus. EXAM: PORTABLE ABDOMEN - 1 VIEW COMPARISON:  Abdominal radiograph  10/24/2015. FINDINGS: Enteric tube tip projects over the descending duodenum. Gas is demonstrated within nondilated loops of large and small bowel in a nonobstructed pattern. Right upper quadrant is excluded from view. Lumbar spine degenerative changes. IMPRESSION: Feeding tube terminates within the descending duodenum. Nonobstructed bowel gas pattern. Electronically Signed   By: Annia Belt M.D.   On: 10/29/2015 08:08  CXR w/ improved aeration   STUDIES:  PFT 4/25 >> restrictive lung dz, low DLCO diffusion LE Doppler 5/2 >>  negative  CULTURES: Sputum 5/5 >>few yeast consistent with candida species Sputum 5/6>>> BC 5/6>>>  ANTIBIOTICS: Cefuroxime 4/28 >> 4/30 vanc 5/6>> Zosyn 5/6>>>  SIGNIFICANT EVENTS: 4/28 CABG and CEA 5/01 Bradycardia / hypotension with any weaning of sedation 5/02 Episodes of desaturation, increased O2 needs 5/03 Weaned off dopamine, neo, sedation reduced 5/5 trach.  5/6 fevers, still on fio2 60%/peep 8. ABX started; lasix cut back to daily  5/7 Creatinine up. Lasix stopped. Weaning on PS 10/peep 8, fent gtt stopped and changed to PRN  LINES/TUBES: 4/28  midine R arm >>5/1  4/28  L subclavian introducer >  4/28  R neck drain >> 4/30 4/28  mediastinal chest tubes >> 4/30 5/01  PICC >> Trach 5/5 Ninetta Lights)  ASSESSMENT / PLAN:   DISCUSSION: 70 y/o male with obesity hypoventilation syndrome who underwent a redo CABG and CEA on 4/28. Remains on vent post-operatively - anxiety/encephalopathy, bradycardia/hypotension as barriers to extubation but these are improving. S/p trach 5/5. ABX started on 5/6. No new culture data today. fevers coming down. Now weaning on PS 10/peep 8, Vts acceptable. MS improved. Goals for today: wean as able, dc fent gtt, hold lasix w/ rising creatinine.  ASSESSMENT / PLAN:  PULMONARY A: Vent dependence  Chronic respiratory failure with hypercapnea > improving Obesity hypoventilation syndrome Tracheostomy status. S/p trach 5/5 ->having  some difficulty w/ trach and losing Vt. May need to replace w/ prox XLT Monday ->weaning 5/7; Vts acceptable. Tolerating well   P:   Daily assessment for SBT / WUA Wean per protocol Initially we will plan on nocturnal ventilation; but eventually will need to determine if he needs this at d/c Limit sedation  Mobilize as able No plans for decannulation at this point.   NEUROLOGIC A:   Acute encephalopathy > improving slowly Baseline mental illness, poorly understood, multiple medications at home Chronic pain  MS improved as of 5/7. Wonder if fever +/- some level of infection was contributing factor P:   RASS goal: now 0 to -1 Dc fentanyl gtt and change to PRN Continue seroquel 150 BID and follow QTc (467 on 5/6) interval closely, titrate as able PAD protocol Continue cymbalta, buspar  CARDIOVASCULAR A:  S/p CABG and (R) CEA P:  Per vascular and TCTS Monitor hemodynamics Tele  RENAL A:   AKI; initially had been improving. Now Scr rising. Suspect he is actually on dry side,.  P:   Monitor BMET and UOP Dc scheduled lasix (5/7) Replace electrolytes as needed    GASTROINTESTINAL A:   Morbid Obesity  P:   Cortrak placed 5/1, continue TF PPI Nutrition per surgery  HEMATOLOGIC A:   Anemia, no bleeding 5/5 HgB 7.7 transfuse 1 unit pRBC's (per surgical svc) Thrombocytopenia - overall improved P:  Transfusion per usual ICU guidelines Monitor platelets   INFECTIOUS A:   Recurrent fever 5/6, normal WBC;  No infiltrate on CXR 5/7 P:   Sputum and blood sent 5/6; nothing to date  Vanc/zosyn 5/6>>>  ENDOCRINE A:   Hyperglycemia P:   Continue home liraglutide, SSI, levemir   FAMILY  - Updates: No family at bedside to update. The patients mother passed away 11/02/22 (he is unaware).    Simonne Martinet ACNP-BC [facility] Pulmonary/Critical Care Pager # [phone] OR # [phone] if no answer   STAFF NOTE: I, Rory Percy, MD FACP have personally reviewed patient's  available data, including medical history, events of note, physical examination and test results as part of my evaluation. I have discussed with resident/NP and other care providers such as pharmacist, RN and RRT. In addition, I personally evaluated patient and elicited key findings XB:JYNWG, alert, coarse bases left greater rt, trach site clean without bleeding, PS 10 cpap 8, now that on 50% we should reduce peep/cpap to 5, also his volumes are good , we can drop PS to 8, he has 6 trach for a large male, so he may continued to need higher PS?, agree no lasix, follow crt trend, allow pos balance, reduce continuous sedation to off as goal, i updated pt and family members in room, watch crt closely on lovenox, with his size, i would and neurostatus and low secretion status would favor in line PMV early , will order, also would favor to trach collar attempt in am even if needing 60%, no fevers, send UA, follow LLL on pcxr, favor dc all abx soon, assess pct trend The patient is critically ill with multiple organ systems failure and requires high complexity decision making for assessment and support, frequent evaluation and titration of therapies, application of advanced monitoring technologies and extensive interpretation of multiple databases.   Critical Care Time devoted to patient care services described in this note is 30 Minutes. This time reflects time of care of this signee: Rory Percy, MD FACP. This critical care time does not reflect procedure time, or teaching time or supervisory time of PA/NP/Med student/Med Resident etc but could involve care discussion time. Rest per NP/medical resident whose note is outlined above and that I agree with   Mcarthur Rossetti. Tyson Alias, MD, FACP Pgr: [phone] [place] [facility] 10/29/2015 12:46 PM

## 2015-10-29 NOTE — Progress Notes (Signed)
eLink Nursing ICU Electrolyte Replacement Protocol  Patient Name: Clide DeutscherDwight E Librizzi DOB: December 21, 1945 MRN: [id number]  Date of Service  10/29/2015   HPI/Events of Note    Recent Labs Lab 10/25/15 0440  10/26/15 0457  10/27/15 0430 10/27/15 1700 10/28/15 0400 10/28/15 1612 10/29/15 0355  NA 135  < > 137  < > 141 140 140 140 141  K 3.8  < > 3.7  < > 3.6 3.5 3.8 3.5 3.6  CL 99*  < > 99*  < > 95* 96* 96* 96* 98*  CO2 26  --  28  --  32  --  33*  --  30  GLUCOSE 164*  < > 213*  < > 181* 130* 171* 154* 142*  BUN 28*  < > 29*  < > 30* 28* 31* 31* 29*  CREATININE 1.59*  < > 1.48*  < > 1.48* 1.40* 1.42* 1.30* 1.61*  CALCIUM 7.6*  --  8.1*  --  8.5*  --  8.2*  --  8.5*  < > = values in this interval not displayed.  Estimated Creatinine Clearance: 62.6 mL/min (by C-G formula based on Cr of 1.61).  Intake/Output      05/06 0701 - 05/07 0700   I.V. (mL/kg) 800 (6)   NG/GT 405   IV Piggyback 500   Total Intake(mL/kg) 1705 (12.8)   Urine (mL/kg/hr) 1815 (0.6)   Total Output 1815   Net -110        - I/O DETAILED x24h    Total I/O In: 610 [I.V.:320; NG/GT:240; IV Piggyback:50] Out: 485 [Urine:485] - I/O THIS SHIFT    ASSESSMENT   eICURN Interventions  Unable to replace K+ per protocol.  MD informed   ASSESSMENT: MAJOR ELECTROLYTE    Merita NortonFrye, Karmel Patricelli Nicole 10/29/2015, 5:33 AM

## 2015-10-30 ENCOUNTER — Inpatient Hospital Stay (HOSPITAL_COMMUNITY): Payer: Medicare Other

## 2015-10-30 DIAGNOSIS — R400 Somnolence: Secondary | ICD-10-CM

## 2015-10-30 LAB — CBC
HCT: 30.4 % — ABNORMAL LOW (ref 39.0–52.0)
Hemoglobin: 9.3 g/dL — ABNORMAL LOW (ref 13.0–17.0)
MCH: 28.5 pg (ref 26.0–34.0)
MCHC: 30.6 g/dL (ref 30.0–36.0)
MCV: 93.3 fL (ref 78.0–100.0)
Platelets: 156 10*3/uL (ref 150–400)
RBC: 3.26 MIL/uL — ABNORMAL LOW (ref 4.22–5.81)
RDW: 15 % (ref 11.5–15.5)
WBC: 5.5 10*3/uL (ref 4.0–10.5)

## 2015-10-30 LAB — BASIC METABOLIC PANEL
Anion gap: 10 (ref 5–15)
BUN: 22 mg/dL — ABNORMAL HIGH (ref 6–20)
CO2: 29 mmol/L (ref 22–32)
Calcium: 8.5 mg/dL — ABNORMAL LOW (ref 8.9–10.3)
Chloride: 99 mmol/L — ABNORMAL LOW (ref 101–111)
Creatinine, Ser: 1.27 mg/dL — ABNORMAL HIGH (ref 0.61–1.24)
GFR calc Af Amer: >60 mL/min (ref >60)
GFR calc non Af Amer: 55 mL/min — ABNORMAL LOW (ref >60)
Glucose, Bld: 246 mg/dL — ABNORMAL HIGH (ref 65–99)
Potassium: 3.9 mmol/L (ref 3.5–5.1)
Sodium: 138 mmol/L (ref 135–145)

## 2015-10-30 LAB — URINE CULTURE: Culture: 20000 — AB

## 2015-10-30 LAB — GLUCOSE, CAPILLARY
Glucose-Capillary: 122 mg/dL — ABNORMAL HIGH (ref 65–99)
Glucose-Capillary: 138 mg/dL — ABNORMAL HIGH (ref 65–99)
Glucose-Capillary: 142 mg/dL — ABNORMAL HIGH (ref 65–99)
Glucose-Capillary: 158 mg/dL — ABNORMAL HIGH (ref 65–99)
Glucose-Capillary: 162 mg/dL — ABNORMAL HIGH (ref 65–99)
Glucose-Capillary: 164 mg/dL — ABNORMAL HIGH (ref 65–99)
Glucose-Capillary: 213 mg/dL — ABNORMAL HIGH (ref 65–99)

## 2015-10-30 LAB — CARBOXYHEMOGLOBIN
Carboxyhemoglobin: 1.4 % (ref 0.5–1.5)
Methemoglobin: 0.7 % (ref 0.0–1.5)
O2 Saturation: 65.3 %
Total hemoglobin: 13.4 g/dL — ABNORMAL LOW (ref 13.5–18.0)

## 2015-10-30 LAB — PROCALCITONIN: PROCALCITONIN: 0.19 ng/mL

## 2015-10-30 LAB — VANCOMYCIN, TROUGH: Vancomycin Tr: 12 ug/mL (ref 10.0–20.0)

## 2015-10-30 MED ORDER — HYDRALAZINE HCL 20 MG/ML IJ SOLN
INTRAMUSCULAR | Status: AC
  Filled 2015-10-30: qty 1

## 2015-10-30 MED ORDER — QUETIAPINE FUMARATE 100 MG PO TABS
100.0000 mg | ORAL_TABLET | Freq: Two times a day (BID) | ORAL | Status: DC
  Administered 2015-10-30 – 2015-11-02 (×7): 100 mg via ORAL
  Filled 2015-10-30 (×6): qty 1

## 2015-10-30 MED ORDER — HYDRALAZINE HCL 20 MG/ML IJ SOLN
10.0000 mg | INTRAMUSCULAR | Status: DC | PRN
  Administered 2015-10-30 – 2015-11-03 (×15): 10 mg via INTRAVENOUS
  Filled 2015-10-30 (×9): qty 1

## 2015-10-30 MED ORDER — INSULIN DETEMIR 100 UNIT/ML ~~LOC~~ SOLN
35.0000 [IU] | Freq: Two times a day (BID) | SUBCUTANEOUS | Status: DC
  Administered 2015-10-30 – 2015-11-14 (×28): 35 [IU] via SUBCUTANEOUS
  Filled 2015-10-30 (×33): qty 0.35

## 2015-10-30 NOTE — Progress Notes (Signed)
  Progress Note    10/30/2015 7:46 AM 10 Days Post-Op  Subjective:  Wants water  Tm 99.6 now afebrile HR 90's-100's NSR/ST 110's-170's systolic  Filed Vitals:   10/30/15 0600 10/30/15 0700  BP: 156/77 170/74  Pulse: 96 97  Temp:    Resp: 19 20    Physical Exam: Cardiac:  regular Lungs:  Non labored; now on trach Incisions:  Clean and dry with staples in tact Extremities:  Moving all extremities equally Neuro:  Awake, following commands; tongue is midline.  Neuro intact.  CBC    Component Value Date/Time   WBC 5.5 10/30/2015 0344   RBC 3.26* 10/30/2015 0344   HGB 9.3* 10/30/2015 0344   HCT 30.4* 10/30/2015 0344   PLT 156 10/30/2015 0344   MCV 93.3 10/30/2015 0344   MCH 28.5 10/30/2015 0344   MCHC 30.6 10/30/2015 0344   RDW 15.0 10/30/2015 0344   LYMPHSABS 1700 10/11/2015 1637   MONOABS 400 10/11/2015 1637   EOSABS 150 10/11/2015 1637   BASOSABS 0 10/11/2015 1637    BMET    Component Value Date/Time   NA 134* 10/29/2015 1619   K 3.3* 10/29/2015 1619   CL 91* 10/29/2015 1619   CO2 30 10/29/2015 0355   GLUCOSE 190* 10/29/2015 1619   BUN 25* 10/29/2015 1619   CREATININE 1.10 10/29/2015 1619   CREATININE 1.40* 10/11/2015 1637   CALCIUM 8.5* 10/29/2015 0355   GFRNONAA 41* 10/29/2015 0355   GFRAA 48* 10/29/2015 0355    INR    Component Value Date/Time   INR 1.35 10/20/2015 1810     Intake/Output Summary (Last 24 hours) at 10/30/15 0746 Last data filed at 10/30/15 0700  Gross per 24 hour  Intake 2802.17 ml  Output   1620 ml  Net 1182.17 ml     Assessment:  70 y.o. male is s/p:  Right carotid endarterectomy with redo CABG/TEE  10 Days Post-Op  Plan: -much more alert now  -on trach-wean per primary team -pt neurologically intact  -DVT prophylaxis:  Lovenox   Doreatha MassedSamantha Rhyne, PA-C [facility] [phone] 10/30/2015 7:46 AM

## 2015-10-30 NOTE — Progress Notes (Signed)
Pt placed on PS trial at this time per MD 10/5, pt tolerating well, goal today per MD is to advance to ATC trial this afternoon if able, RT will continue to monitor.

## 2015-10-30 NOTE — Progress Notes (Signed)
Patient ID: Tyler Le, male   DOB: January 16, 1946, 70 y.o.   MRN: [id number]   SICU Evening Rounds:   Hypertensive 180's this pm. Received Lopressor without effect. Will give prn hydralazine IV. He was on lisinopril and Norvasc preop. Creat is up a little so will wait on lisinopril. Not supposed to crush Norvasc to put down feeding tube.  Has tolerated trach collar since 9 am.  Urine output good    CBC    Component Value Date/Time   WBC 5.5 10/30/2015 0344   RBC 3.26* 10/30/2015 0344   HGB 9.3* 10/30/2015 0344   HCT 30.4* 10/30/2015 0344   PLT 156 10/30/2015 0344   MCV 93.3 10/30/2015 0344   MCH 28.5 10/30/2015 0344   MCHC 30.6 10/30/2015 0344   RDW 15.0 10/30/2015 0344   LYMPHSABS 1700 10/11/2015 1637   MONOABS 400 10/11/2015 1637   EOSABS 150 10/11/2015 1637   BASOSABS 0 10/11/2015 1637     BMET    Component Value Date/Time   NA 138 10/30/2015 1028   K 3.9 10/30/2015 1028   CL 99* 10/30/2015 1028   CO2 29 10/30/2015 1028   GLUCOSE 246* 10/30/2015 1028   BUN 22* 10/30/2015 1028   CREATININE 1.27* 10/30/2015 1028   CREATININE 1.40* 10/11/2015 1637   CALCIUM 8.5* 10/30/2015 1028   GFRNONAA 55* 10/30/2015 1028   GFRAA >60 10/30/2015 1028     A/P: HTN: add prn Hydralazine        Continue trach collar, pulmonary toilet.

## 2015-10-30 NOTE — Clinical Social Work Note (Signed)
CSW called patient's wife and left message for a return call. CSW continuing to follow for support and discharge needs.  Valero EnergyShonny Aashritha Miedema, LCSW [phone]

## 2015-10-30 NOTE — Progress Notes (Signed)
Noted MD dosing vancomycin for empiric coverage of VAP.  A trough was drawn this morning- results slightly below trough goal of 15-2020mcg/mL at 4012mcg/mL.  Recommend increasing vancomycin to 1250mg  IV q12h and rechecking another trough in a few days.  Naquan Garman D. Kavari Parrillo, PharmD, BCPS Clinical Pharmacist Pager: [phone] 10/30/2015 11:56 AM

## 2015-10-30 NOTE — Progress Notes (Signed)
Per MD Bartle, repeat dose of 10mg  q hr until SBP<150 per initial parameters

## 2015-10-30 NOTE — Progress Notes (Signed)
Cortrak tube out of pt's nose.  Contacted Cortrak team to replace.

## 2015-10-30 NOTE — Progress Notes (Signed)
Name: Tyler Le MRN: [id number] DOB: 10-Jul-1945    ADMISSION DATE:  10/16/2015 CONSULTATION DATE:  10/18/15  REFERRING MD : Donata Clay, MD  CHIEF COMPLAINT:  Hypercapnia   SUBJECTIVE:    No distress, tol ATC.    VITAL SIGNS: Temp:  [98.2 F (36.8 C)-99.6 F (37.6 C)] 98.4 F (36.9 C) (05/08 0700) Pulse Rate:  [87-105] 97 (05/08 0918) Resp:  [5-32] 19 (05/08 0918) BP: (101-171)/(45-77) 171/74 mmHg (05/08 0918) SpO2:  [90 %-100 %] 99 % (05/08 0918) FiO2 (%):  [50 %] 50 % (05/08 0918) Weight:  [134.4 kg (296 lb 4.8 oz)] 134.4 kg (296 lb 4.8 oz) (05/08 0500)  Intake/Output Summary (Last 24 hours) at 10/30/15 0930 Last data filed at 10/30/15 0900  Gross per 24 hour  Intake 2796.67 ml  Output   1635 ml  Net 1161.67 ml   PHYSICAL EXAMINATION: General:  Obese male; awake, NAD  HENT: NCAT; trach unremarkable right neck staples intact  PULM: resps even non labored on ATC, coarse, decreased bases CV: RRR, no clear murmur GI: BS+, soft MSK: normal bulk and tone, moves all extremities Neuro: awake, alert, follows commands   Recent Labs Lab 10/27/15 0430  10/28/15 0400 10/28/15 1612 10/29/15 0355 10/29/15 1619  NA 141  < > 140 140 141 134*  K 3.6  < > 3.8 3.5 3.6 3.3*  CL 95*  < > 96* 96* 98* 91*  CO2 32  --  33*  --  30  --   BUN 30*  < > 31* 31* 29* 25*  CREATININE 1.48*  < > 1.42* 1.30* 1.61* 1.10  GLUCOSE 181*  < > 171* 154* 142* 190*  < > = values in this interval not displayed.  Recent Labs Lab 10/28/15 0400  10/29/15 0355 10/29/15 1619 10/30/15 0344  HGB 8.3*  < > 8.4* 7.8* 9.3*  HCT 26.9*  < > 26.1* 23.0* 30.4*  WBC 4.8  --  6.5  --  5.5  PLT 168  --  169  --  156  < > = values in this interval not displayed. Dg Chest Port 1 View  10/30/2015  CLINICAL DATA:  Coronary artery disease with coronary artery bypass grafting. EXAM: PORTABLE CHEST 1 VIEW COMPARISON:  Oct 29, 2015 FINDINGS: Tracheostomy catheter tip is 6.8 cm above the carina. Central  catheter tip is in the superior vena cava. Feeding tube tip is below the diaphragm. No pneumothorax. There is bibasilar atelectasis. Lungs elsewhere clear. Heart is mildly enlarged with pulmonary vascularity within normal limits. No adenopathy. There are skin staples overlying the right neck. IMPRESSION: Tube and catheter positions as described without pneumothorax. Stable cardiac prominence. Bibasilar atelectasis. Electronically Signed   By: Bretta Bang III M.D.   On: 10/30/2015 07:46   Dg Chest Port 1 View  10/29/2015  CLINICAL DATA:  Patient status post CABG. EXAM: PORTABLE CHEST 1 VIEW COMPARISON:  Chest radiograph 10/29/2015. FINDINGS: Right upper extremity PICC line tip projects over the superior vena cava. Tracheostomy tube stable in position. Enteric tube courses inferior to the diaphragm. Stable enlarged cardiac and mediastinal contours. Increasing airspace opacities left lower lung with small left pleural effusion. IMPRESSION: Increasing airspace opacities left lung base with small left pleural effusion. Electronically Signed   By: Annia Belt M.D.   On: 10/29/2015 11:18   Dg Chest Port 1 View  10/29/2015  CLINICAL DATA:  Patient status post CABG procedure. Shortness of breath. EXAM: PORTABLE CHEST 1 VIEW COMPARISON:  Chest radiograph 10/28/2015. FINDINGS: Right-sided PICC line tip projects in the right atrium. Interval removal left subclavian CVC. Tracheostomy tube terminates in the mid trachea. Enteric tube courses inferior to the diaphragm. Stable cardiac and mediastinal contours status post median sternotomy. Low lung volumes. Bilateral airspace opacities. No pneumothorax. Surgical clips right cervical soft tissues. IMPRESSION: Bibasilar atelectasis. Stable tracheostomy tube. Electronically Signed   By: Annia Belt M.D.   On: 10/29/2015 08:05   Dg Abd Portable 1v  10/29/2015  CLINICAL DATA:  Patient with history of ileus. EXAM: PORTABLE ABDOMEN - 1 VIEW COMPARISON:  Abdominal radiograph  10/24/2015. FINDINGS: Enteric tube tip projects over the descending duodenum. Gas is demonstrated within nondilated loops of large and small bowel in a nonobstructed pattern. Right upper quadrant is excluded from view. Lumbar spine degenerative changes. IMPRESSION: Feeding tube terminates within the descending duodenum. Nonobstructed bowel gas pattern. Electronically Signed   By: Annia Belt M.D.   On: 10/29/2015 08:08  CXR w/ improved aeration   STUDIES:  PFT 4/25 >> restrictive lung dz, low DLCO diffusion LE Doppler 5/2 >>  negative  CULTURES: Sputum 5/5 >>few yeast consistent with candida species Sputum 5/6>>> BC 5/6>>> Urine 5/6>>>GNR>>>  ANTIBIOTICS: Cefuroxime 4/28 >> 4/30 vanc 5/6>> Zosyn 5/6>>>  SIGNIFICANT EVENTS: 4/28 CABG and CEA 5/01 Bradycardia / hypotension with any weaning of sedation 5/02 Episodes of desaturation, increased O2 needs 5/03 Weaned off dopamine, neo, sedation reduced 5/5 trach.  5/6 fevers, still on fio2 60%/peep 8. ABX started; lasix cut back to daily  5/7 Creatinine up. Lasix stopped. Weaning on PS 10/peep 8, fent gtt stopped and changed to PRN  LINES/TUBES: 4/28  midine R arm >>5/1  4/28  L subclavian introducer >  4/28  R neck drain >> 4/30 4/28  mediastinal chest tubes >> 4/30 5/01  PICC >> Trach 5/5 (JY)>>>  ASSESSMENT / PLAN:   DISCUSSION: 70 y/o male with obesity hypoventilation syndrome who underwent a redo CABG and CEA on 4/28. Remains on vent post-operatively - anxiety/encephalopathy, bradycardia/hypotension as barriers to extubation but these are improving. S/p trach 5/5. ABX started on 5/6. Tolerating ATC trials.   ASSESSMENT / PLAN:  PULMONARY A: Vent dependence  Chronic respiratory failure with hypercapnea > improving Obesity hypoventilation syndrome Tracheostomy status. S/p trach 5/5 P:   ATC wean this am  qhs vent mandatory for now - will need to determine what his qhs pos pressure needs are long term prior to d/c Limit  sedation  Mobilize as able No plans for decannulation at this point.  Intermittent f/u CXR  Will order ST eval for PMV   NEUROLOGIC A:   Acute encephalopathy > improving slowly Baseline mental illness, poorly understood, multiple medications at home Chronic pain  MS improved as of 5/7. Wonder if fever +/- some level of infection was contributing factor P:   RASS goal: now 0 to -1 PRN fentanyl  Decrease seroquel to 100 BID and follow QTc interval closely, further titrate as able PAD protocol Continue cymbalta, buspar Mobilize if ok with CVTS   CARDIOVASCULAR A:  S/p CABG and (R) CEA P:  Per vascular and TCTS Monitor hemodynamics Tele  RENAL A:   AKI - initially improving then Scr rising 5/7.  Improved 5/8 off diuresis.   P:   Monitor BMET and UOP Scheduled lasix d/c'd 5/7 Replace electrolytes as needed    GASTROINTESTINAL A:   Morbid Obesity  P:   Cortrak placed 5/1, continue TF PPI Nutrition per surgery Consider swallow eval  per ST if continues to tol ATC   HEMATOLOGIC A:   Anemia, no bleeding Thrombocytopenia - resolved  P:  Transfusion per usual ICU guidelines Monitor platelets  lovenox   INFECTIOUS A:   Recurrent fever 5/6, normal WBC;  No infiltrate on CXR 5/7 P:   Sputum and blood sent 5/6; nothing to date  Vanc/zosyn 5/6>>>  ENDOCRINE A:   Hyperglycemia P:   Continue home liraglutide, SSI, levemir   FAMILY  - Updates: pt and wife updated at bedside 5/8. The patients mother passed away 10/28/2022 (he is unaware).      Dirk Dress, NP 10/30/2015  9:30 AM Pager: [phone] or [phone]  Attending Note:  70 year old male with extensive PMH who was trached last week and now is progressing with weaning.  History of OSA and OHV.  On exam, coarse BS diffusely.  I reviewed CXR myself, pulmonary edema.  Continue weaning efforts, mandatory vent QHS.  Will need to determine long term vent needs.  Maintain trach as is, may need XLT.  Hold  diureses given renal function.  Hold in the ICU.  The patient is critically ill with multiple organ systems failure and requires high complexity decision making for assessment and support, frequent evaluation and titration of therapies, application of advanced monitoring technologies and extensive interpretation of multiple databases.   Critical Care Time devoted to patient care services described in this note is  35  Minutes. This time reflects time of care of this signee Dr Koren Bound. This critical care time does not reflect procedure time, or teaching time or supervisory time of PA/NP/Med student/Med Resident etc but could involve care discussion time.  Alyson Reedy, M.D. [facility] Pulmonary/Critical Care Medicine. Pager: [phone]. After hours pager: [phone].

## 2015-10-30 NOTE — Progress Notes (Signed)
Procedure(s) (LRB): REDO CORONARY ARTERY BYPASS GRAFTING (CABG), TIMES THREE, USING LEFT GREATER SAPHENOUS VEIN HARVESTED ENDOSCOPICALLY AND CRYO VEIN, WITH CORONARY ENDARTERECTOMY (N/A) TRANSESOPHAGEAL ECHOCARDIOGRAM (TEE) (N/A) ENDARTERECTOMY CAROTID (Right) Subjective: tol trach collar Still not OOB to chair- need PT nsr Feeding tube pulled out No more fever- on vanc/zosyn Objective: Vital signs in last 24 hours: Temp:  [98.1 F (36.7 C)-99.6 F (37.6 C)] 98.1 F (36.7 C) (05/08 1200) Pulse Rate:  [88-105] 101 (05/08 1300) Cardiac Rhythm:  [-] Normal sinus rhythm;Bundle branch block (05/08 1300) Resp:  [18-32] 31 (05/08 1300) BP: (119-188)/(54-79) 188/79 mmHg (05/08 1300) SpO2:  [90 %-100 %] 99 % (05/08 1300) FiO2 (%):  [40 %-50 %] 40 % (05/08 1300) Weight:  [296 lb 4.8 oz (134.4 kg)] 296 lb 4.8 oz (134.4 kg) (05/08 0500)  Hemodynamic parameters for last 24 hours:  stable  Intake/Output from previous day: 05/07 0701 - 05/08 0700 In: 2852.2 [I.V.:1064.2; NG/GT:1188; IV Piggyback:600] Out: 1670 [Urine:1670] Intake/Output this shift: Total I/O In: 1010 [I.V.:190; NG/GT:570; IV Piggyback:250] Out: 675 [Urine:675]       Exam    General- alert and comfortable   Lungs- clear without rales, wheezes   Cor- regular rate and rhythm, no murmur , gallop   Abdomen- soft, non-tender   Extremities - warm, non-tender, minimal edema   Neuro- oriented, appropriate, no focal weakness   Lab Results:  Recent Labs  10/29/15 0355 10/29/15 1619 10/30/15 0344  WBC 6.5  --  5.5  HGB 8.4* 7.8* 9.3*  HCT 26.1* 23.0* 30.4*  PLT 169  --  156   BMET:  Recent Labs  10/29/15 0355 10/29/15 1619 10/30/15 1028  NA 141 134* 138  K 3.6 3.3* 3.9  CL 98* 91* 99*  CO2 30  --  29  GLUCOSE 142* 190* 246*  BUN 29* 25* 22*  CREATININE 1.61* 1.10 1.27*  CALCIUM 8.5*  --  8.5*    PT/INR: No results for input(s): LABPROT, INR in the last 72 hours. ABG    Component Value Date/Time   PHART 7.305* 10/28/2015 0527   HCO3 34.6* 10/28/2015 0527   TCO2 27 10/29/2015 1619   ACIDBASEDEF 1.0 10/20/2015 1824   O2SAT 65.3 10/30/2015 0402   CBG (last 3)   Recent Labs  10/30/15 0355 10/30/15 0802 10/30/15 1212  GLUCAP 162* 164* 213*    Assessment/Plan: S/P Procedure(s) (LRB): REDO CORONARY ARTERY BYPASS GRAFTING (CABG), TIMES THREE, USING LEFT GREATER SAPHENOUS VEIN HARVESTED ENDOSCOPICALLY AND CRYO VEIN, WITH CORONARY ENDARTERECTOMY (N/A) TRANSESOPHAGEAL ECHOCARDIOGRAM (TEE) (N/A) ENDARTERECTOMY CAROTID (RightLTAC consult   Mobilize Tube feeds, adjust lantus insulin Vent wean LTAC consult in progress  LOS: 14 days    Kathlee Nationseter Van Trigt III 10/30/2015

## 2015-10-30 NOTE — Progress Notes (Signed)
Pt has t wave changes on monitor.  Lead III has ST elevation.  Pt denies any pain and has no s/s of any acute distress.  EKG ordered to verify.

## 2015-10-30 NOTE — Care Management Note (Signed)
Case Management Note  Patient Details  Name: Clide DeutscherDwight E Fotopoulos MRN: [id number] Date of Birth: September 05, 1945  Subjective/Objective:  Pt admitted for Unstable Angina. Plan for CABG on 10-20-15. Pt is from home with wife. Pt has no DME needs at this time.                 Action/Plan: 10/30/2015 Pt is from home with wife.  Pt remains ventilated on sedation and pressors.  CM will continue to monitor for disposition needs  CM will continue to monitor for disposition needs.   Expected Discharge Date:                  Expected Discharge Plan:  Long Term Acute Care (LTAC)  In-House Referral:  NA, Clinical Social Work  Discharge planning Services  CM Consult  Post Acute Care Choice:    Choice offered to:     DME Arranged:    DME Agency:     HH Arranged:    HH Agency:     Status of Service:  In process, will continue to follow  Medicare Important Message Given:  Yes Date Medicare IM Given:    Medicare IM give by:    Date Additional Medicare IM Given:    Additional Medicare Important Message give by:     If discussed at Long Length of Stay Meetings, dates discussed:    Additional Comments: 10/30/2015  CM received LTACH referral.  CM gave referral to both [facility] lia[facility].  CM contact pts wife [name] via phone to inform of discharge recommendation and provided geographical information regarding the two options in the local area.  Pts wife will contact son and follow back up with CM.   10/27/15 Pt trached today 10/27/15.  CM had very detailed discussion with wife regarding discharge options should pt not be able to wean off vent/trach prior to discharge including;  Home with Home Health (requirements explained in detail), LTAC, and SNF.  CM explained that discharge planning begins early so that barriers can be eliminated prior to possible delays in progression of care.  CM will continue to follow pt and family and assist with discharge needs.  CSW consulted on tentative consult.    Cherylann ParrClaxton, Gabreal Worton S, RN 10/30/2015, 2:43 PM

## 2015-10-31 ENCOUNTER — Inpatient Hospital Stay (HOSPITAL_COMMUNITY): Payer: Medicare Other

## 2015-10-31 DIAGNOSIS — E118 Type 2 diabetes mellitus with unspecified complications: Secondary | ICD-10-CM

## 2015-10-31 LAB — CARBOXYHEMOGLOBIN
Carboxyhemoglobin: 1.1 % (ref 0.5–1.5)
Methemoglobin: 0.8 % (ref 0.0–1.5)
O2 Saturation: 67 %
Total hemoglobin: 11.8 g/dL — ABNORMAL LOW (ref 13.5–18.0)

## 2015-10-31 LAB — CBC
HCT: 25.4 % — ABNORMAL LOW (ref 39.0–52.0)
Hemoglobin: 8 g/dL — ABNORMAL LOW (ref 13.0–17.0)
MCH: 29 pg (ref 26.0–34.0)
MCHC: 31.5 g/dL (ref 30.0–36.0)
MCV: 92 fL (ref 78.0–100.0)
Platelets: 233 10*3/uL (ref 150–400)
RBC: 2.76 MIL/uL — ABNORMAL LOW (ref 4.22–5.81)
RDW: 14.8 % (ref 11.5–15.5)
WBC: 7.6 10*3/uL (ref 4.0–10.5)

## 2015-10-31 LAB — BASIC METABOLIC PANEL
Anion gap: 12 (ref 5–15)
BUN: 22 mg/dL — ABNORMAL HIGH (ref 6–20)
CO2: 26 mmol/L (ref 22–32)
Calcium: 8.6 mg/dL — ABNORMAL LOW (ref 8.9–10.3)
Chloride: 101 mmol/L (ref 101–111)
Creatinine, Ser: 1.15 mg/dL (ref 0.61–1.24)
GFR calc Af Amer: >60 mL/min (ref >60)
GFR calc non Af Amer: >60 mL/min (ref >60)
Glucose, Bld: 186 mg/dL — ABNORMAL HIGH (ref 65–99)
Potassium: 3.9 mmol/L (ref 3.5–5.1)
Sodium: 139 mmol/L (ref 135–145)

## 2015-10-31 LAB — CULTURE, RESPIRATORY W GRAM STAIN: Special Requests: NORMAL

## 2015-10-31 LAB — GLUCOSE, CAPILLARY
Glucose-Capillary: 101 mg/dL — ABNORMAL HIGH (ref 65–99)
Glucose-Capillary: 150 mg/dL — ABNORMAL HIGH (ref 65–99)
Glucose-Capillary: 168 mg/dL — ABNORMAL HIGH (ref 65–99)
Glucose-Capillary: 177 mg/dL — ABNORMAL HIGH (ref 65–99)
Glucose-Capillary: 197 mg/dL — ABNORMAL HIGH (ref 65–99)
Glucose-Capillary: 95 mg/dL (ref 65–99)
Glucose-Capillary: 97 mg/dL (ref 65–99)

## 2015-10-31 LAB — PROCALCITONIN: PROCALCITONIN: 0.15 ng/mL

## 2015-10-31 MED ORDER — POTASSIUM CHLORIDE 10 MEQ/50ML IV SOLN
10.0000 meq | INTRAVENOUS | Status: AC
  Administered 2015-10-31 (×2): 10 meq via INTRAVENOUS
  Filled 2015-10-31 (×2): qty 50

## 2015-10-31 MED ORDER — METOCLOPRAMIDE HCL 5 MG/ML IJ SOLN: 10.0000 mg | Freq: Four times a day (QID) | INTRAMUSCULAR | Status: DC

## 2015-10-31 MED ORDER — PIPERACILLIN-TAZOBACTAM 3.375 G IVPB
3.3750 g | Freq: Three times a day (TID) | INTRAVENOUS | Status: DC
  Administered 2015-10-31 – 2015-11-02 (×6): 3.375 g via INTRAVENOUS
  Filled 2015-10-31 (×8): qty 50

## 2015-10-31 NOTE — Progress Notes (Signed)
Pt not tol vent well. Placed pt back on ATC at 40% FIO2. Pt tol well at this time.

## 2015-10-31 NOTE — Progress Notes (Signed)
Patient ID: Tyler Le, male   DOB: [birth year], 70 y.o.   MRN: [id number] EVENING ROUNDS NOTE :     [address]       [place] [id number]             [phone]                 11 Days Post-Op Procedure(s) (LRB): REDO CORONARY ARTERY BYPASS GRAFTING (CABG), TIMES THREE, USING LEFT GREATER SAPHENOUS VEIN HARVESTED ENDOSCOPICALLY AND CRYO VEIN, WITH CORONARY ENDARTERECTOMY (N/A) TRANSESOPHAGEAL ECHOCARDIOGRAM (TEE) (N/A) ENDARTERECTOMY CAROTID (Right)  Total Length of Stay:  LOS: 15 days  BP 164/77 mmHg  Pulse 102  Temp(Src) 99.6 F (37.6 C) (Oral)  Resp 16  Ht 6\' 4"  (1.93 m)  Wt 294 lb 12.1 oz (133.7 kg)  BMI 35.89 kg/m2  SpO2 97%  .Intake/Output      05/09 0701 - 05/10 0700   I.V. (mL/kg) 160 (1.2)   NG/GT 30   IV Piggyback 350   Total Intake(mL/kg) 540 (4)   Urine (mL/kg/hr) 1025 (0.6)   Stool    Total Output 1025   Net -485         . sodium chloride 10 mL/hr at 10/24/15 1900  . sodium chloride 10 mL/hr at 10/30/15 0900  . sodium chloride 30 mL/hr (10/25/15 1700)  . sodium chloride 10 mL/hr at 10/31/15 0200  . feeding supplement (VITAL HIGH PROTEIN) 1,000 mL (10/31/15 0200)  . lactated ringers    . lactated ringers 10 mL/hr at 10/25/15 0700  . midazolam (VERSED) infusion Stopped (10/26/15 0630)     Lab Results  Component Value Date   WBC 7.6 10/31/2015   HGB 8.0* 10/31/2015   HCT 25.4* 10/31/2015   PLT 233 10/31/2015   GLUCOSE 186* 10/31/2015   CHOL 130 05/09/2011   TRIG 359* 05/09/2011   HDL 27* 05/09/2011   LDLCALC 31 05/09/2011   ALT 36 10/29/2015   AST 40 10/29/2015   NA 139 10/31/2015   K 3.9 10/31/2015   CL 101 10/31/2015   CREATININE 1.15 10/31/2015   BUN 22* 10/31/2015   CO2 26 10/31/2015   TSH 6.676* 10/17/2015   INR 1.35 10/20/2015   HGBA1C 7.1* 10/19/2015   Back on vent now but, but progress weaning vent during day   [name] MD  Beeper [phone] Office [phone] 10/31/2015 8:50 PM

## 2015-10-31 NOTE — Progress Notes (Signed)
Physical Therapy Treatment Patient Details Name: Tyler Le MRN: [id number] DOB: [birth year] Today's Date: 10/31/2015    History of Present Illness pt presents with Cardiac Arrest and Redo of CABG x3.  pt intubated 4/24 and with Trach on 5/5.  pt with hx of CABG, Anxiety, HTN, DM, CAD, Renal Insufficiency, and Back Surgeries.      PT Comments    Pt alert and agreeable for mobility.  Pt weaning on vent and did have increased WOB while sitting EOB.  Pt indicates nausea in sitting and RN made aware.  Pt continues to require extensive A for all mobility and would benefit from further therapies.    Follow Up Recommendations  SNF (vs Ltach)     Equipment Recommendations  None recommended by PT    Recommendations for Other Services       Precautions / Restrictions Precautions Precautions: Fall;Sternal Precaution Comments: Trach, Vent Restrictions Weight Bearing Restrictions: No    Mobility  Bed Mobility Overal bed mobility: Needs Assistance;+2 for physical assistance Bed Mobility: Rolling;Sidelying to Sit;Sit to Sidelying Rolling: Max assist;+2 for physical assistance Sidelying to sit: Max assist;+2 for physical assistance;HOB elevated     Sit to sidelying: Max assist;+2 for physical assistance General bed mobility comments: pt does participate in bed mobility, but needs cues for UE use and sternal precautions.    Transfers                    Ambulation/Gait                 Stairs            Wheelchair Mobility    Modified Rankin (Stroke Patients Only)       Balance Overall balance assessment: Needs assistance Sitting-balance support: Bilateral upper extremity supported;Feet supported Sitting balance-[name] Scale: Poor Sitting balance - Comments: pt needs consistent Min - ModA to maintain sitting balance.  pt with increased WOB while sitting and weaning on vent.                              Cognition Arousal/Alertness:  Awake/alert Behavior During Therapy: Flat affect Overall Cognitive Status: Difficult to assess                      Exercises      General Comments        Pertinent Vitals/Pain Pain Assessment: No/denies pain    Home Living                      Prior Function            PT Goals (current goals can now be found in the care plan section) Acute Rehab PT Goals Patient Stated Goal: Per wife to recover and come home. PT Goal Formulation: With patient/family Time For Goal Achievement: 11/12/15 Potential to Achieve Goals: Good Progress towards PT goals: Progressing toward goals    Frequency  Min 3X/week    PT Plan Current plan remains appropriate    Co-evaluation             End of Session Equipment Utilized During Treatment:  (Vent) Activity Tolerance: Patient tolerated treatment well Patient left: in bed;with call bell/phone within reach;with nursing/sitter in room     Time: 7253-6644 PT Time Calculation (min) (ACUTE ONLY): 23 min  Charges:  $Therapeutic Activity: 23-37 mins  G CodesSunny Schlein, [place] [phone] 10/31/2015, 1:45 PM

## 2015-10-31 NOTE — Progress Notes (Signed)
10/31/2015 1230 Dr. Donata ClayVan Trigt paged and made aware of pt. With intermittent nausea today as well as tympanic bowel sounds. Verbal order received to keep TF off at this time. MD to place order for KUB. Will continue to closely monitor patient.  Tyler Le, Blanchard KelchStephanie Le

## 2015-10-31 NOTE — Progress Notes (Signed)
SATS decreased and RR increased while on ATC. Pt placed back on full support

## 2015-10-31 NOTE — Progress Notes (Signed)
[facility] Progress Note  Subjective    On trach collar.  Objective Filed Vitals:   10/31/15 0600 10/31/15 0700  BP: 145/67 164/66  Pulse: 112 111  Temp:    Resp: 25 34    Intake/Output Summary (Last 24 hours) at 10/31/15 0747 Last data filed at 10/31/15 0700  Gross per 24 hour  Intake   2390 ml  Output   2850 ml  Net   -460 ml    Alert on trach collar Moving all extremities equally. 5/5 strength upper and lower extremities Tongue midline, smile symmetric Right neck incision clean and intact. No hematoma.  Assessment/Planning: 70 y.o. male is s/p: combined right carotid endarterectomy and redo CABG 11 Days Post-Op   Alert this am with neuro exam intact   Raymond GurneyKimberly A Sylvi Rybolt 10/31/2015 7:47 AM --  Laboratory CBC    Component Value Date/Time   WBC 7.6 10/31/2015 0255   HGB 8.0* 10/31/2015 0255   HCT 25.4* 10/31/2015 0255   PLT 233 10/31/2015 0255    BMET    Component Value Date/Time   NA 139 10/31/2015 0255   K 3.9 10/31/2015 0255   CL 101 10/31/2015 0255   CO2 26 10/31/2015 0255   GLUCOSE 186* 10/31/2015 0255   BUN 22* 10/31/2015 0255   CREATININE 1.15 10/31/2015 0255   CREATININE 1.40* 10/11/2015 1637   CALCIUM 8.6* 10/31/2015 0255   GFRNONAA >60 10/31/2015 0255   GFRAA >60 10/31/2015 0255    COAG Lab Results  Component Value Date   INR 1.35 10/20/2015   INR 1.03 10/17/2015   INR 0.90 10/11/2015   No results found for: PTT  Antibiotics Anti-infectives    Start     Dose/Rate Route Frequency Ordered Stop   10/28/15 1600  piperacillin-tazobactam (ZOSYN) IVPB 3.375 g     3.375 g 100 mL/hr over 30 Minutes Intravenous Every 8 hours 10/28/15 0942     10/28/15 1000  vancomycin (VANCOCIN) IVPB 1000 mg/200 mL premix     1,000 mg 200 mL/hr over 60 Minutes Intravenous Every 12 hours 10/28/15 0820     10/28/15 1000  piperacillin-tazobactam (ZOSYN) IVPB 3.375 g     3.375 g 100 mL/hr over 30 Minutes Intravenous  Once 10/28/15 0942  10/28/15 1130   10/28/15 0945  piperacillin-tazobactam (ZOSYN) IVPB 4.5 g  Status:  Discontinued     4.5 g 200 mL/hr over 30 Minutes Intravenous Every 8 hours 10/28/15 0942 10/28/15 0943   10/28/15 0830  piperacillin-tazobactam (ZOSYN) IVPB 4.5 g  Status:  Discontinued     4.5 g 200 mL/hr over 30 Minutes Intravenous Every 8 hours 10/28/15 0820 10/28/15 0941   10/21/15 0400  cefUROXime (ZINACEF) 1.5 g in dextrose 5 % 50 mL IVPB     1.5 g 100 mL/hr over 30 Minutes Intravenous Every 12 hours 10/20/15 1759 10/22/15 1633   10/20/15 1900  vancomycin (VANCOCIN) IVPB 1000 mg/200 mL premix     1,000 mg 200 mL/hr over 60 Minutes Intravenous  Once 10/20/15 1759 10/20/15 2109   10/20/15 0400  vancomycin (VANCOCIN) 1,500 mg in sodium chloride 0.9 % 250 mL IVPB     1,500 mg 125 mL/hr over 120 Minutes Intravenous To Surgery 10/19/15 1129 10/20/15 0800   10/20/15 0400  cefUROXime (ZINACEF) 1.5 g in dextrose 5 % 50 mL IVPB     1.5 g 100 mL/hr over 30 Minutes Intravenous To Surgery 10/19/15 1129 10/20/15 1553   10/20/15 0400  cefUROXime (ZINACEF) 750 mg in  dextrose 5 % 50 mL IVPB  Status:  Discontinued     750 mg 100 mL/hr over 30 Minutes Intravenous To Surgery 10/19/15 1129 10/20/15 1759       Maris Berger, PA-C [facility] Office: [phone] Pager: [phone] 10/31/2015 7:47 AM

## 2015-10-31 NOTE — Progress Notes (Addendum)
Nutrition Follow-up  DOCUMENTATION CODES:   Obesity unspecified  INTERVENTION:    Recommend increasing Vital High Protein formula by 10 ml every 6-8 hours back to goal rate of 60 ml/hr   Recommend Prostat liquid protein 60 ml BID via tube  Total above TF regimen to provide 1840 kcals, 186 gm protein, 1204 ml of free water daily  NUTRITION DIAGNOSIS:   Inadequate oral intake related to inability to eat as evidenced by NPO status   Ongoing  GOAL:   Provide needs based on ASPEN/SCCM guidelines  Progressing  MONITOR:   TF tolerance, Vent status, Labs, Weight trends, I & O's  ASSESSMENT:   Tyler Le is a 70 y.o. male, who presents for evaluation of bilateral internal carotid artery stenosis. He is scheduled for a redo CABG on Friday with Dr. Donata ClayVan Trigt for severe recurrent three vessel CAD. He previously had a CABG in 2001. He underwent cardiac cath on 10/16/15 due to unstable angina and was admitted afterwards.   Patient s/p redo CABG 4/28.  Patient is currently on ventilator support MV: 19.1 L/min Temp (24hrs), Avg:98.9 F (37.2 C), Min:98.1 F (36.7 C), Max:99.5 F (37.5 C)   S/p bedside trach 5/5.  Vital High Protein formula currently off via CORTRAK small bore feeding tube. [facility]  -- tip now past GE junction. Per RN, patient with vomiting this AM; to resume TF at 40 ml/hr at 12 noon today. Vital High Protein formula previously infusing at goal rate of 60 ml/hr (1440 kcals, 126 gm protein, 1204 ml of free water). IV Reglan started 5/6.  Diet Order:  Diet NPO time specified  Skin:  Reviewed, no issues  Last BM:  5/8  Height:   Ht Readings from Last 1 Encounters:  10/28/15 6\' 4"  (1.93 m)    Weight:   Wt Readings from Last 1 Encounters:  10/31/15 294 lb 12.1 oz (133.7 kg)    Ideal Body Weight:  91.8 kg  BMI:  Body mass index is 35.89 kg/(m^2).  Estimated Nutritional Needs:   Kcal:  [id number]  Protein:   >/= 180 gm  Fluid:  per MD  EDUCATION NEEDS:   No education needs identified at this time  Maureen ChattersKatie Kaimen Peine, RD, LDN Pager #: [phone] After-Hours Pager #: [phone]

## 2015-10-31 NOTE — Progress Notes (Signed)
Name: Tyler DeutscherDwight E Le MRN: [id number] DOB: 07-06-1945    ADMISSION DATE:  10/16/2015 CONSULTATION DATE:  10/18/15  REFERRING MD : Donata ClayVan Trigt, MD  CHIEF COMPLAINT:  Hypercapnia   SUBJECTIVE:    No distress, tol ATC.   VITAL SIGNS: Temp:  [98.1 F (36.7 C)-99.5 F (37.5 C)] 98.6 F (37 C) (05/09 0747) Pulse Rate:  [86-120] 98 (05/09 0912) Resp:  [17-41] 18 (05/09 0912) BP: (126-201)/(61-131) 132/91 mmHg (05/09 0912) SpO2:  [82 %-100 %] 95 % (05/09 0912) FiO2 (%):  [40 %-60 %] 60 % (05/09 0912) Weight:  [133.7 kg (294 lb 12.1 oz)] 133.7 kg (294 lb 12.1 oz) (05/09 0400)  Intake/Output Summary (Last 24 hours) at 10/31/15 1036 Last data filed at 10/31/15 0902  Gross per 24 hour  Intake   1825 ml  Output   2785 ml  Net   -960 ml   PHYSICAL EXAMINATION: General:  Obese male; awake, NAD  HENT: NCAT; trach unremarkable right neck staples intact  PULM: resps even non labored on ATC, coarse, decreased bases CV: RRR, no clear murmur GI: BS+, soft MSK: normal bulk and tone, moves all extremities Neuro: awake, alert, follows commands  Recent Labs Lab 10/29/15 0355 10/29/15 1619 10/30/15 1028 10/31/15 0255  NA 141 134* 138 139  K 3.6 3.3* 3.9 3.9  CL 98* 91* 99* 101  CO2 30  --  29 26  BUN 29* 25* 22* 22*  CREATININE 1.61* 1.10 1.27* 1.15  GLUCOSE 142* 190* 246* 186*    Recent Labs Lab 10/29/15 0355 10/29/15 1619 10/30/15 0344 10/31/15 0255  HGB 8.4* 7.8* 9.3* 8.0*  HCT 26.1* 23.0* 30.4* 25.4*  WBC 6.5  --  5.5 7.6  PLT 169  --  156 233   Dg Chest Port 1 View  10/31/2015  CLINICAL DATA:  Status post CABG on 28 Oct 2015 EXAM: PORTABLE CHEST 1 VIEW COMPARISON:  Portable chest x-ray of Oct 30, 2015.  Ste FINDINGS: The lungs are reasonably well inflated. The retrocardiac region on the left remains dense. The pulmonary interstitial markings are slightly more conspicuous bilaterally today. There is a small left pleural effusion. The cardiac silhouette remains enlarged. The  tracheostomy appliance tube tip projects at the superior margin of the clavicular heads. The feeding tube tip projects below the inferior margin of the image. The right-sided PICC line tip projects over the midportion of the SVC. The sternal wires are intact where visualized. IMPRESSION: Persistent left lower lobe atelectasis. Mild pulmonary interstitial edema and tiny left pleural effusion. The support tubes are in stable position. Electronically Signed   By: David  SwazilandJordan M.D.   On: 10/31/2015 08:02   Dg Chest Port 1 View  10/30/2015  CLINICAL DATA:  Coronary artery disease with coronary artery bypass grafting. EXAM: PORTABLE CHEST 1 VIEW COMPARISON:  Oct 29, 2015 FINDINGS: Tracheostomy catheter tip is 6.8 cm above the carina. Central catheter tip is in the superior vena cava. Feeding tube tip is below the diaphragm. No pneumothorax. There is bibasilar atelectasis. Lungs elsewhere clear. Heart is mildly enlarged with pulmonary vascularity within normal limits. No adenopathy. There are skin staples overlying the right neck. IMPRESSION: Tube and catheter positions as described without pneumothorax. Stable cardiac prominence. Bibasilar atelectasis. Electronically Signed   By: Bretta BangWilliam  Woodruff III M.D.   On: 10/30/2015 07:46   Dg Chest Port 1 View  10/29/2015  CLINICAL DATA:  Patient status post CABG. EXAM: PORTABLE CHEST 1 VIEW COMPARISON:  Chest radiograph 10/29/2015. FINDINGS:  Right upper extremity PICC line tip projects over the superior vena cava. Tracheostomy tube stable in position. Enteric tube courses inferior to the diaphragm. Stable enlarged cardiac and mediastinal contours. Increasing airspace opacities left lower lung with small left pleural effusion. IMPRESSION: Increasing airspace opacities left lung base with small left pleural effusion. Electronically Signed   By: Annia Belt M.D.   On: 10/29/2015 11:18   Dg Abd Portable 1v  10/30/2015  CLINICAL DATA:  Evaluate NG tube placement EXAM: PORTABLE  ABDOMEN - 1 VIEW COMPARISON:  10/29/15 FINDINGS: The feeding tube tip is below the level of the GE junction and appears to be in the expected location of the gastric antrum or pylorus. IMPRESSION: 1. Tip of feeding tube is well below GE junction. Electronically Signed   By: Signa Kell M.D.   On: 10/30/2015 17:45  CXR w/ improved aeration   STUDIES:  PFT 4/25 >> restrictive lung dz, low DLCO diffusion LE Doppler 5/2 >>  negative  CULTURES: Sputum 5/5 >>few yeast consistent with candida species Sputum 5/6>>> BC 5/6>>> Urine 5/6>>>GNR>>>  ANTIBIOTICS: Cefuroxime 4/28 >> 4/30 vanc 5/6>> Zosyn 5/6>>>  SIGNIFICANT EVENTS: 4/28 CABG and CEA 5/01 Bradycardia / hypotension with any weaning of sedation 5/02 Episodes of desaturation, increased O2 needs 5/03 Weaned off dopamine, neo, sedation reduced 5/5 trach.  5/6 fevers, still on fio2 60%/peep 8. ABX started; lasix cut back to daily  5/7 Creatinine up. Lasix stopped. Weaning on PS 10/peep 8, fent gtt stopped and changed to PRN  LINES/TUBES: 4/28  midine R arm >>5/1  4/28  L subclavian introducer >  4/28  R neck drain >> 4/30 4/28  mediastinal chest tubes >> 4/30 5/01  PICC >> Trach 5/5 (JY)>>>  ASSESSMENT / PLAN:  DISCUSSION: 70 y/o male with obesity hypoventilation syndrome who underwent a redo CABG and CEA on 4/28. Remains on vent post-operatively - anxiety/encephalopathy, bradycardia/hypotension as barriers to extubation but these are improving. S/p trach 5/5. ABX started on 5/6. Tolerating ATC trials.   ASSESSMENT / PLAN:  PULMONARY A: Vent dependence  Chronic respiratory failure with hypercapnea > improving Obesity hypoventilation syndrome Tracheostomy status. S/p trach 5/5 P:   ATC wean this am  qhs vent mandatory for now - if able to tolerate TC in AM then will consider 24/7 TC. Limit sedation  Mobilize as able - PT involved No plans for decannulation at this point.  Intermittent f/u CXR  Will order ST eval for  PMV til AM, if tolerating TC in AM then PMV and SLP.  NEUROLOGIC A:   Acute encephalopathy > improving slowly Baseline mental illness, poorly understood, multiple medications at home Chronic pain  MS improved as of 5/7. Wonder if fever +/- some level of infection was contributing factor P:   RASS goal: now 0 to -1 PRN fentanyl  Decreased seroquel to 100 BID and follow QTc interval closely, further titrate as able PAD protocol Continue cymbalta, buspar Mobilize  CARDIOVASCULAR A:  S/p CABG and (R) CEA P:  Per vascular and TCTS Monitor hemodynamics Tele  RENAL A:   AKI - initially improving then Scr rising 5/7.  Improved 5/8 off diuresis.   P:   Monitor BMET and UOP Scheduled lasix d/c'd 5/7 Replace electrolytes as needed    GASTROINTESTINAL A:   Morbid Obesity  P:   Cortrak placed 5/1, continue TF PPI Nutrition per surgery Consider swallow eval per ST if continues to tol ATC and hold off PEG for now.  HEMATOLOGIC A:  Anemia, no bleeding Thrombocytopenia - resolved  P:  Transfusion per usual ICU guidelines Lovenox  CBC in AM.  INFECTIOUS A:   Recurrent fever 5/6, normal WBC;  No infiltrate on CXR 5/7 P:   Sputum and blood sent 5/6; nothing to date  Vanc/zosyn 5/6>>>  ENDOCRINE A:   Hyperglycemia P:   Continue home liraglutide, SSI, levemir  FAMILY  - Updates: pt and wife updated at bedside.  The patient is critically ill with multiple organ systems failure and requires high complexity decision making for assessment and support, frequent evaluation and titration of therapies, application of advanced monitoring technologies and extensive interpretation of multiple databases.   Critical Care Time devoted to patient care services described in this note is  35  Minutes. This time reflects time of care of this signee Dr Koren Bound. This critical care time does not reflect procedure time, or teaching time or supervisory time of PA/NP/Med student/Med  Resident etc but could involve care discussion time.  Alyson Reedy, M.D. [facility] Pulmonary/Critical Care Medicine. Pager: [phone]. After hours pager: [phone].

## 2015-10-31 NOTE — Progress Notes (Addendum)
11 Days Post-Op Procedure(s) (LRB): REDO CORONARY ARTERY BYPASS GRAFTING (CABG), TIMES THREE, USING LEFT GREATER SAPHENOUS VEIN HARVESTED ENDOSCOPICALLY AND CRYO VEIN, WITH CORONARY ENDARTERECTOMY (N/A) TRANSESOPHAGEAL ECHOCARDIOGRAM (TEE) (N/A) ENDARTERECTOMY CAROTID (Right) Subjective: On trach collar during day nsr Gastric dilatation- TF on hold and reglan resumed  Objective: Vital signs in last 24 hours: Temp:  [98.6 F (37 C)-99.5 F (37.5 C)] 98.9 F (37.2 C) (05/09 1500) Pulse Rate:  [86-120] 102 (05/09 1700) Cardiac Rhythm:  [-] Bundle branch block;Sinus tachycardia (05/09 1700) Resp:  [17-41] 24 (05/09 1700) BP: (124-201)/(56-131) 153/86 mmHg (05/09 1700) SpO2:  [82 %-100 %] 95 % (05/09 1700) FiO2 (%):  [40 %-60 %] 60 % (05/09 1700) Weight:  [294 lb 12.1 oz (133.7 kg)] 294 lb 12.1 oz (133.7 kg) (05/09 0400)  Hemodynamic parameters for last 24 hours:   stable Intake/Output from previous day: 05/08 0701 - 05/09 0700 In: 2400 [I.V.:500; NG/GT:1350; IV Piggyback:550] Out: 2925 [Urine:2925] Intake/Output this shift: Total I/O In: 450 [I.V.:100; IV Piggyback:350] Out: 810 [Urine:810]  Neuro intact Incisions clean  Lab Results:  Recent Labs  10/30/15 0344 10/31/15 0255  WBC 5.5 7.6  HGB 9.3* 8.0*  HCT 30.4* 25.4*  PLT 156 233   BMET:  Recent Labs  10/30/15 1028 10/31/15 0255  NA 138 139  K 3.9 3.9  CL 99* 101  CO2 29 26  GLUCOSE 246* 186*  BUN 22* 22*  CREATININE 1.27* 1.15  CALCIUM 8.5* 8.6*    PT/INR: No results for input(s): LABPROT, INR in the last 72 hours. ABG    Component Value Date/Time   PHART 7.305* 10/28/2015 0527   HCO3 34.6* 10/28/2015 0527   TCO2 27 10/29/2015 1619   ACIDBASEDEF 1.0 10/20/2015 1824   O2SAT 67.0 10/31/2015 0345   CBG (last 3)   Recent Labs  10/31/15 0815 10/31/15 1221 10/31/15 1600  GLUCAP 177* 168* 97    Assessment/Plan: S/P Procedure(s) (LRB): REDO CORONARY ARTERY BYPASS GRAFTING (CABG), TIMES THREE,  USING LEFT GREATER SAPHENOUS VEIN HARVESTED ENDOSCOPICALLY AND CRYO VEIN, WITH CORONARY ENDARTERECTOMY (N/A) TRANSESOPHAGEAL ECHOCARDIOGRAM (TEE) (N/A) ENDARTERECTOMY CAROTID (Right) Cont vent wean Still has not been OOB  LTAC consult Cont Zosyn for serratia HCAP   LOS: 15 days    Kathlee Nationseter Van Trigt III 10/31/2015

## 2015-10-31 NOTE — Evaluation (Signed)
[facility] Speaking Valve - Evaluation Patient Details  Name: Tyler Le MRN: [id number] Date of Birth: [birth year]  Today's Date: 10/31/2015 Time: 1120-1137 SLP Time Calculation (min) (ACUTE ONLY): 17 min  Past Medical History:  Past Medical History  Diagnosis Date  . Essential hypertension   . Dyslipidemia   . Coronary artery disease     a. 2001: s/p CABG X4;  b. 08/2010 s/p PCI/BMS to VG->RI->OM;  c. 06/2014 Cath/PCI: LM nl, LAD 120m, LCX 26m, RI 100, OM1 100, OM2 80, small, RCA 100ost, VG->RCA 100, VG->Diag 100, VG->RI->OM1 70-80 ISR (4.0x24 Promus DES), LIMA->LAD nl.  . Morbid obesity (HCC)   . Dysrhythmia   . History of blood transfusion 2001    "related to OHS"  . Anemia   . GERD (gastroesophageal reflux disease)   . Depression   . Anxiety   . Nephrolithiasis   . Complication of anesthesia     "they have a hard time waking him up"  . Type II diabetes mellitus (HCC)   . Arthritis     "back, neck" (07/11/2014)  . Chronic lower back pain   . Thrombocytopenia (HCC)     a. Borderline low platelets by labs noted in 06/2014 but also noted on prior labs as well.  . Renal insufficiency     a. noted 06/2014 - baseline unclear as no data since 2012.  Marland Kitchen RBBB    Past Surgical History:  Past Surgical History  Procedure Laterality Date  . Cardiac catheterization  2001; ~ 2010  . Coronary artery bypass graft  2001    "CABG X 5"  . Appendectomy  ~ 1955  . Lumbar laminectomy  01/2011  . Back surgery    . Cholecystectomy    . Shoulder arthroscopy w/ rotator cuff repair Left 1990's  . Cardiovascular stress test  07/03/2009    EF 64%  . Left heart catheterization with coronary/graft angiogram N/A 05/09/2011    Procedure: LEFT HEART CATHETERIZATION WITH Isabel Caprice;  Surgeon: Kathleene Hazel, MD;  Location: [facility] CATH LAB;  Service: Cardiovascular;  Laterality: N/A;  . Tonsillectomy and adenoidectomy  1951  . Left heart catheterization with coronary angiogram N/A  07/11/2014    Procedure: LEFT HEART CATHETERIZATION WITH CORONARY ANGIOGRAM;  Surgeon: Peter M Swaziland, MD;  Location: [facility] CATH LAB;  Service: Cardiovascular;  Laterality: N/A;  . Coronary angioplasty with stent placement  March 2012    BMS to SVG to intermediate/OM  . Coronary angioplasty with stent placement  07/11/2014    "1"  . Cardiac catheterization N/A 10/16/2015    Procedure: Left Heart Cath and Cors/Grafts Angiography;  Surgeon: Corky Crafts, MD;  Location: [facility] INVASIVE CV LAB;  Service: Cardiovascular;  Laterality: N/A;  . Coronary artery bypass graft N/A 10/20/2015    Procedure: REDO CORONARY ARTERY BYPASS GRAFTING (CABG), TIMES THREE, USING LEFT GREATER SAPHENOUS VEIN HARVESTED ENDOSCOPICALLY AND CRYO VEIN, WITH CORONARY ENDARTERECTOMY;  Surgeon: Kerin Perna, MD;  Location: [facility] OR;  Service: Open Heart Surgery;  Laterality: N/A;  SVG to LAD, SVG to OM, CRYOVEIN to RCA with Endarterectomy  . Tee without cardioversion N/A 10/20/2015    Procedure: TRANSESOPHAGEAL ECHOCARDIOGRAM (TEE);  Surgeon: Kerin Perna, MD;  [facility] OR;  Service: Open Heart Surgery;  Laterality: N/A;  . Endarterectomy Right 10/20/2015    Procedure: ENDARTERECTOMY CAROTID;  Surgeon: Pryor Ochoa, MD;  Location: [facility] OR;  Service: Vascular;  Laterality: Right;   HPI:  pt presents with Cardiac Arrest and Redo  of CABG x3. pt intubated 4/24 and with Trach on 5/5. pt with hx of CABG, Anxiety, HTN, DM, CAD, Renal Insufficiency, and Back Surgeries.    Assessment / Plan / Recommendation Clinical Impression  Pt asleep upon arrival but awakens easily for participation in therapy. Cuff deflated with reflexive coughing productive of small amount of secretions via trach. PMV placed for 10 minutes with spontaneous expulsion from trach hub x1, suspect related to back pressure although no burst of air noted upon removal by SLP. RR increased from 28 to 33, but would return to the high 20s with cues from therapist for  slower, deeper breaths. Phonation sounds clear but low in intensity, requiring Mod cues from SLP to increase intelligibility at the word and phrase level. Pt started falling back asleep and requested that he be allowed to rest. Will continue trials with SLP only at this time given brief initial trial.    SLP Assessment  Patient needs continued Speech Lanaguage Pathology Services    Follow Up Recommendations  Skilled Nursing facility;LTACH    Frequency and Duration min 2x/week  2 weeks    PMSV Trial PMSV was placed for: 10 min Able to redirect subglottic air through upper airway: Yes Able to Attain Phonation: Yes Voice Quality: Low vocal intensity Able to Expectorate Secretions: Yes Level of Secretion Expectoration with PMSV: Tracheal Breath Support for Phonation: Moderately decreased Intelligibility: Intelligibility reduced Phrase: 50-74% accurate Respirations During Trial:  (28-33) SpO2 During Trial: 96 % Pulse During Trial: 98 Behavior: Cooperative;Responsive to questions;Other (comment) (sleepy)   Tracheostomy Tube       Vent Dependency  FiO2 (%): 60 %    Cuff Deflation Trial  GO Tolerated Cuff Deflation: Yes Length of Time for Cuff Deflation Trial: 15 min Behavior: Other (comment);Cooperative (sleepy)         Maxcine Ham, M.A. CCC-SLP [phone]  Maxcine Ham 10/31/2015, 11:47 AM

## 2015-10-31 NOTE — Progress Notes (Signed)
10/31/2015 0830 Dr. Donata ClayVan Trigt on floor and made aware of pt. With vomiting this am. Verbal orders received to hold TF until 1200 then restart at 40 ml/hr. Orders enacted. Will continue to closely monitor patient.  Lenorris Karger, Blanchard KelchStephanie Ingold

## 2015-10-31 NOTE — Care Management Note (Signed)
Case Management Note  Patient Details  Name: Tyler Le MRN: [id number] Date of Birth: 05/29/1946  Subjective/Objective:  Pt admitted for Unstable Angina. Plan for CABG on 10-20-15. Pt is from home with wife. Pt has no DME needs at this time.                 Action/Plan: 10/31/2015 Pt is from home with wife.  Pt remains ventilated on sedation and pressors.  CM will continue to monitor for disposition needs  CM will continue to monitor for disposition needs.   Expected Discharge Date:                  Expected Discharge Plan:  Long Term Acute Care (LTAC)  In-House Referral:  NA, Clinical Social Work  Discharge planning Services  CM Consult  Post Acute Care Choice:    Choice offered to:     DME Arranged:    DME Agency:     HH Arranged:    [place] Agency:     Status of Service:  In process, will continue to follow  Medicare Important Message Given:  Yes Date Medicare IM Given:    Medicare IM give by:    Date Additional Medicare IM Given:    Additional Medicare Important Message give by:     If discussed at [place] of Stay Meetings, dates discussed:    Additional Comments:   10/31/2015  CM met with wife and son and explained the recommended discharge plan of LTACH.  Both wife and son are in agreement with [facility] as discharge disposition.  [facility] liaison met with family and [facility] has been chosen- insurance auth is pending  10/30/15 CM received LTACH referral.  CM gave referral to both [facility] and [facility].  CM contact pts wife [name] via phone to inform of discharge recommendation and provided geographical information regarding the two options in the local area.  Pts wife will contact son and follow back up with CM.   10/27/15 Pt trached today 10/27/15.  CM had very detailed discussion with wife regarding discharge options should pt not be able to wean off vent/trach prior to discharge including;  Home with Home Health (requirements explained in detail), LTAC, and SNF.   CM explained that discharge planning begins early so that barriers can be eliminated prior to possible delays in progression of care.  CM will continue to follow pt and family and assist with discharge needs.  CSW consulted on tentative consult.   Maryclare Labrador, RN 10/31/2015, 2:59 PM

## 2015-11-01 ENCOUNTER — Inpatient Hospital Stay (HOSPITAL_COMMUNITY): Payer: Medicare Other

## 2015-11-01 LAB — CBC
HCT: 30.3 % — ABNORMAL LOW (ref 39.0–52.0)
Hemoglobin: 9.3 g/dL — ABNORMAL LOW (ref 13.0–17.0)
MCH: 28.4 pg (ref 26.0–34.0)
MCHC: 30.7 g/dL (ref 30.0–36.0)
MCV: 92.4 fL (ref 78.0–100.0)
Platelets: 216 10*3/uL (ref 150–400)
RBC: 3.28 MIL/uL — ABNORMAL LOW (ref 4.22–5.81)
RDW: 14.8 % (ref 11.5–15.5)
WBC: 6.3 10*3/uL (ref 4.0–10.5)

## 2015-11-01 LAB — GLUCOSE, CAPILLARY
Glucose-Capillary: 112 mg/dL — ABNORMAL HIGH (ref 65–99)
Glucose-Capillary: 104 mg/dL — ABNORMAL HIGH (ref 65–99)
Glucose-Capillary: 130 mg/dL — ABNORMAL HIGH (ref 65–99)
Glucose-Capillary: 126 mg/dL — ABNORMAL HIGH (ref 65–99)
Glucose-Capillary: 160 mg/dL — ABNORMAL HIGH (ref 65–99)

## 2015-11-01 LAB — PREALBUMIN: Prealbumin: 10.9 mg/dL — ABNORMAL LOW (ref 18–38)

## 2015-11-01 LAB — BASIC METABOLIC PANEL
Anion gap: 11 (ref 5–15)
BUN: 22 mg/dL — ABNORMAL HIGH (ref 6–20)
CO2: 25 mmol/L (ref 22–32)
Calcium: 8.6 mg/dL — ABNORMAL LOW (ref 8.9–10.3)
Chloride: 105 mmol/L (ref 101–111)
Creatinine, Ser: 1.11 mg/dL (ref 0.61–1.24)
GFR calc Af Amer: >60 mL/min (ref >60)
GFR calc non Af Amer: >60 mL/min (ref >60)
Glucose, Bld: 115 mg/dL — ABNORMAL HIGH (ref 65–99)
Potassium: 4.2 mmol/L (ref 3.5–5.1)
Sodium: 141 mmol/L (ref 135–145)

## 2015-11-01 LAB — HEPATIC FUNCTION PANEL
ALT: 83 U/L — ABNORMAL HIGH (ref 17–63)
AST: 66 U/L — ABNORMAL HIGH (ref 15–41)
Albumin: 2.3 g/dL — ABNORMAL LOW (ref 3.5–5.0)
Alkaline Phosphatase: 86 U/L (ref 38–126)
Bilirubin, Direct: 0.8 mg/dL — ABNORMAL HIGH (ref 0.1–0.5)
Indirect Bilirubin: 0.9 mg/dL (ref 0.3–0.9)
Total Bilirubin: 1.7 mg/dL — ABNORMAL HIGH (ref 0.3–1.2)
Total Protein: 6.5 g/dL (ref 6.5–8.1)

## 2015-11-01 LAB — TRIGLYCERIDES: Triglycerides: 130 mg/dL (ref <150)

## 2015-11-01 LAB — MAGNESIUM: Magnesium: 2.5 mg/dL — ABNORMAL HIGH (ref 1.7–2.4)

## 2015-11-01 LAB — PHOSPHORUS: PHOSPHORUS: 3.4 mg/dL (ref 2.5–4.6)

## 2015-11-01 MED ORDER — HYDRALAZINE HCL 20 MG/ML IJ SOLN
10.0000 mg | Freq: Three times a day (TID) | INTRAMUSCULAR | Status: DC
  Administered 2015-11-01 – 2015-11-08 (×16): 10 mg via INTRAVENOUS
  Filled 2015-11-01 (×17): qty 1

## 2015-11-01 MED ORDER — ALTEPLASE 2 MG IJ SOLR
2.0000 mg | Freq: Once | INTRAMUSCULAR | Status: AC
  Administered 2015-11-01: 2 mg
  Filled 2015-11-01: qty 2

## 2015-11-01 MED ORDER — METOPROLOL TARTRATE 5 MG/5ML IV SOLN
10.0000 mg | Freq: Four times a day (QID) | INTRAVENOUS | Status: DC
  Administered 2015-11-02 – 2015-11-04 (×12): 10 mg via INTRAVENOUS
  Administered 2015-11-04: 5 mg via INTRAVENOUS
  Administered 2015-11-05 – 2015-11-07 (×8): 10 mg via INTRAVENOUS
  Filled 2015-11-01 (×21): qty 10

## 2015-11-01 MED ORDER — TRACE MINERALS CR-CU-MN-SE-ZN 10-1000-500-60 MCG/ML IV SOLN
INTRAVENOUS | Status: AC
  Administered 2015-11-01: 17:00:00 via INTRAVENOUS
  Filled 2015-11-01: qty 960

## 2015-11-01 MED ORDER — ALBUMIN HUMAN 5 % IV SOLN
12.5000 g | Freq: Four times a day (QID) | INTRAVENOUS | Status: AC
  Administered 2015-11-01 (×2): 12.5 g via INTRAVENOUS
  Filled 2015-11-01 (×2): qty 250

## 2015-11-01 MED ORDER — FUROSEMIDE 10 MG/ML IJ SOLN
20.0000 mg | Freq: Two times a day (BID) | INTRAMUSCULAR | Status: DC
  Administered 2015-11-01 – 2015-11-08 (×15): 20 mg via INTRAVENOUS
  Filled 2015-11-01 (×15): qty 2

## 2015-11-01 MED ORDER — METOPROLOL TARTRATE 5 MG/5ML IV SOLN
5.0000 mg | Freq: Once | INTRAVENOUS | Status: AC
  Administered 2015-11-01: 5 mg via INTRAVENOUS
  Filled 2015-11-01: qty 5

## 2015-11-01 MED ORDER — METOPROLOL TARTRATE 5 MG/5ML IV SOLN
5.0000 mg | Freq: Four times a day (QID) | INTRAVENOUS | Status: DC
  Administered 2015-11-01 (×2): 5 mg via INTRAVENOUS
  Filled 2015-11-01 (×2): qty 5

## 2015-11-01 MED ORDER — OXYMETAZOLINE HCL 0.05 % NA SOLN
2.0000 | Freq: Two times a day (BID) | NASAL | Status: DC
  Administered 2015-11-01 – 2015-11-14 (×24): 2 via NASAL
  Filled 2015-11-01 (×2): qty 15

## 2015-11-01 MED ORDER — TRAVASOL 10 % IV SOLN: INTRAVENOUS | Status: DC

## 2015-11-01 NOTE — Care Management Important Message (Signed)
Important Message  Patient Details  Name: Tyler Le MRN: [id number] Date of Birth: Nov 19, 1945   Medicare Important Message Given:  Yes    [name], Jamisyn Langer [name] 11/01/2015, 1:13 PM

## 2015-11-01 NOTE — Discharge Summary (Signed)
Physician Discharge Summary  Patient ID: Tyler Le MRN: [id number] DOB/AGE: 11-21-1945 70 y.o.  Admit date: 10/16/2015 Discharge date: 11/14/2015  Admission Diagnoses: severe recurrent CAD  Discharge Diagnoses:  Principal Problem:   Accelerating angina [facility]) Active Problems:   S/P CABG (coronary artery bypass graft)   Type II diabetes mellitus (HCC)   Dyslipidemia   CAD (coronary artery disease)   Unstable angina (HCC)   CKD (chronic kidney disease), stage III   Altered mental status   Coronary artery disease involving coronary bypass graft of native heart with unspecified angina pectoris   Coronary artery disease involving native coronary artery of native heart without angina pectoris   S/P CABG x 3   Acute respiratory failure with hypoxemia (HCC)   Atelectasis   Feeding tube dysfunction   Ileus (HCC)   S/P CABG x 4   Surgery, elective   Tracheostomy in place ([place])   Debility   Benign essential HTN   Adjustment disorder with mixed anxiety and depressed mood   Obesity   Chronic low back pain   OSA (obstructive sleep apnea)   Dysphagia   Tachycardia   Tachypnea   Acute blood loss anemia   Left lower lobe pneumonia   Tracheostomy care [facility])  Patient Active Problem List   Diagnosis Date Noted  . Ileus ([place])   . S/P CABG x 4   . Surgery, elective   . Tracheostomy in place [facility])   . Debility   . Benign essential HTN   . Adjustment disorder with mixed anxiety and depressed mood   . Obesity   . Chronic low back pain   . OSA (obstructive sleep apnea)   . Dysphagia   . Tachycardia   . Tachypnea   . Acute blood loss anemia   . Left lower lobe pneumonia   . Tracheostomy care ([place])   . Atelectasis   . Feeding tube dysfunction   . Acute respiratory failure with hypoxemia ([place])   . S/P CABG x 3 10/20/2015  . Altered mental status   . Coronary artery disease involving coronary bypass graft of native heart with unspecified angina pectoris   . Coronary artery  disease involving native coronary artery of native heart without angina pectoris   . CKD (chronic kidney disease), stage III 10/17/2015  . Unstable angina ([place]) 10/16/2015  . Accelerating angina ([place])   . Acute kidney injury ([place]) 08/02/2014  . AKI (acute kidney injury) ([place]) 08/01/2014  . Renal failure 08/01/2014  . Gastroenteritis, acute 08/01/2014  . Diabetes mellitus type 2 in obese ([place]) 08/01/2014  . Abnormal nuclear stress test 07/11/2014  . Hypertension   . Essential hypertension   . Depression 05/08/2011  . CAD (coronary artery disease) 01/15/2011  . S/P CABG (coronary artery bypass graft) 10/03/2010  . Angina pectoris 10/03/2010  . Type II diabetes mellitus ([place]) 10/03/2010  . Dyslipidemia 10/03/2010  . Low back pain 10/03/2010  . Anxiety 10/03/2010  . Benign hypertensive heart disease without heart failure 10/03/2010   History of Present Illness:at time of consultation  70 year old obese Caucasian diabetic male admitted following cardiac catheterization today. The patient is status post CABG 5 in 2001--left IMA to LAD, saphenous vein to diagonal, sequential saphenous vein graft to ramus intermediate and circumflex marginal, saphenous vein graft to distal RCA. Starting approximately 12 years postop the patient developed recurrent angina and has had PCI's to the sequential circumflex-ramus vein graft. Vein grafts to the diagonal and RCA had been occluded. Mammary artery has  been patent. Last PCI was January 2016 with bare-metal stent-good flow to the sequential ramus-circumflex vein graft and good mammary artery flow to the distal LAD. His recent stress test showed probable ischemia in the anterior LAD distribution.  Repeat cardiac cath position shows new LAD disease distal to the left IMA anastomosis and recurrent stenosis of the sequential vein graft between the ramus intermediate and distal circumflex. the distal RCA posterior descending, distal circumflex, distal LAD appear  to be adequate targets for grafting. Echocardiogram is pending to assess his LV function but LVEDP was fairly normal. Patient had vein graft harvested from the entire right leg but the left leg should have adequate vein for endoscopic harvest. The patient is in a washout of his Brillinta prior to redo CABG.  Discharged Condition: good  Hospital Course: The patient was admitted by cardiology for cardiac cath, which was done on 4/ 24 by Dr Irish Lack. The following results were noted: Conclusion     Severe native three-vessel coronary artery disease.  LIMA was injected is normal in caliber, and is anatomically normal. There was severe disease in the LAD after the insertion of the LIMA.  SVG to diagonal and SVG to PDA were Known to be occluded from prior cath.  Sequential lesions in the second half of the jump graft from the SVG to ramus/OM, of 95 and 90% respectively. Lesions are too distal for distal protection if PCI was performed.  A pullback with a 4 French straight catheter across the ostium of the subclavian showed no pressure gradient.   Due to these findings, CT surgery consult was obtained and redo surgery was recommended. Due to carotid disease a vascular surgery consult was also obtained and Dr Kellie Simmering recommended a right CEA at time of redo CABG. These were performed on 4/28 after a Brilinta washout period.  Post-operatively the patient has had a complex course. He has had post-op ventilator dependent  Respiratory failure with his severe COPD/obesity hypoventilation with exacerbation, . CCM has assisted with this management. A perc trach was done on 10/27/2015 and this has been slowly weaned during his hospitalization.  He was stable and de-cannulated on 11/12/2015.  He maintains good oxygenation levels on room air. He has required tube feedings. He initially required aggressive inotropic support and diuresis for HF/volume overload. He continues to require some lasix. He had a postop  delerium that continues to improve. He does have a baseline mental ilness on meds at home but not well understood. He had acute renal insuff which has improved with creat now in normal range. He has an anemia which is multifactorial, but hbg is 9.3 currently. He has had postop fever and has been treated with IV abx for broad spectrum and pulm coverage. Cultures have been positive for serratia and pneumonia is the presumed source. He has had minimal physical activity d/t severe debilitation. He has had gastric dilitation and reglan has been used. The patient has made good progress from transfer from ICU.  He is no longer requiring tube feeds and is tolerating a regular diet without difficulty.  He continues to be hypervolemic and will remain on Lasix at discharge.  His has mild renal insufficiency with a creatinine of 1.34 and will remain off ACE at this time.  He has HTN and was started on Norvasc for BP support.  He remains deconditioned.  He is able to ambulate with assistance.  Placement for inpatient rehab was declined by the patients insurance, so SNF arrangements have been made.  He is maintaining NSR and his pacing wires have been removed.  He is felt medically stable for discharge today. His tracheostomy tube was decreased to a 6 [place] cuffless  tube which was then removed 4 days prior to discharge and covered with a Vaseline gauze dressing. The patient has remained stable on room air with no respiratory difficulties after removal of tracheostomy tube.  Consults: cardiology, pulmonary/intensive care and vascular surgery  Significant Diagnostic Studies: angiography: cardiac cath, TEE, PFT's  Treatments: Surgery DATE OF PROCEDURE: 10/20/2015 DATE OF DISCHARGE:   OPERATIVE REPORT   OPERATION: 1. Redo sternotomy. 2. Redo coronary bypass grafting x3 (saphenous vein graft to  circumflex marginal, saphenous vein graft to distal LAD,  cryopreserved allograft  saphenous vein graft to RCA). 3. Coronary endarterectomy-RCA. 4. Placement of right femoral A-line.  SURGEON: Ivin Poot, M.D.  ASSISTANT: Ellwood Handler, PA-C.  PREOPERATIVE DIAGNOSES: Unstable angina, non-ST elevation myocardial infarction, severe recurrent 3-vessel coronary disease status post multivessel CABG x5 in 2001.  POSTOPERATIVE DIAGNOSES: Unstable angina, non-ST elevation myocardial infarction, severe recurrent 3-vessel coronary disease status post multivessel CABG x5 in 2001.  ANESTHESIA: General.  Also:  OPERATIVE REPORT  Date of Surgery: 10/16/2015 - 10/20/2015  Surgeon: Tinnie Gens, MD  Assistant: Dr. Tharon Aquas Trigt, Gerri Lins PA  Pre-op Diagnosis: CAD with severe bilateral internal carotid stenosis-asymptomatic  Post-op Diagnosis: CAD with severe bilateral internal carotid stenosis asymptomatic Procedure: Procedure(s): REDO CORONARY ARTERY BYPASS GRAFTING (CABG) TRANSESOPHAGEAL ECHOCARDIOGRAM (TEE) ENDARTERECTOMY CAROTID-right with Dacron patch angioplasty Anesthesia: General  EBL: 505 cc  Complications: None  Also: Percutaneous Tracheostomy Placement  Consent from family. Patient sedated, paralyzed and position. Placed on 100% FiO2 and RR matched. Area cleaned and draped. Lidocaine/epi injected. Skin incision done followed by blunt dissection. While attempting to enter trachea injured a blood vessel that was found and sutured. Trachea palpated then punctured, catheter passed and visualized bronchoscopically. Wire placed and visualized. Catheter removed. Airway then entered and dilated. Size 6 cuffed shiley trach placed and visualized bronchoscopically well above carina. Good volume returns. Patient tolerated the procedure well without complications. Minimal blood loss. CXR ordered and pending.  Dr. Titus Mould was first assist on the procedure.  Rush Farmer, M.D. [facility] Pulmonary/Critical Care Medicine. Pager:  [phone]. After hours pager: [phone].  Disposition: SNF        Discharge Instructions    Amb Referral to Cardiac Rehabilitation    Complete by:  As directed   Diagnosis:  CABG Comment - to [place]  CABG X ___:  3            Medication List    STOP taking these medications        BRILINTA 90 MG Tabs tablet  Generic drug:  ticagrelor     isosorbide mononitrate 60 MG 24 hr tablet  Commonly known as:  IMDUR     lisinopril 5 MG tablet  Commonly known as:  PRINIVIL,ZESTRIL      TAKE these medications        amLODipine 5 MG tablet  Commonly known as:  NORVASC  Take 1 tablet (5 mg total) by mouth daily.     aspirin 325 MG EC tablet  Take 1 tablet (325 mg total) by mouth daily.     busPIRone 15 MG tablet  Commonly known as:  BUSPAR  Take 15 mg by mouth daily.     carvedilol 25 MG tablet  Commonly known as:  COREG  Take 0.5 tablets (12.5 mg total) by mouth 2 (  two) times daily with a meal.     clopidogrel 75 MG tablet  Commonly known as:  PLAVIX  Take 1 tablet (75 mg total) by mouth daily.     CYMBALTA 60 MG capsule  Generic drug:  DULoxetine  Take 60 mg by mouth daily.     DRY EYES OP  Place 1 drop into both eyes daily as needed (for dry eyes).     ezetimibe 10 MG tablet  Commonly known as:  ZETIA  Take 1 tablet (10 mg total) by mouth daily.     FREESTYLE FREEDOM Kit     furosemide 40 MG tablet  Commonly known as:  LASIX  Take 1 tablet (40 mg total) by mouth daily.     HYDROcodone-acetaminophen 10-325 MG tablet  Commonly known as:  NORCO  Take 1-2 tablets by mouth every 6 (six) hours as needed. For pain     HYDROcodone-acetaminophen 10-325 MG tablet  Commonly known as:  NORCO  Take 1 tablet by mouth every 6 (six) hours as needed for moderate pain.     insulin glargine 100 UNIT/ML injection  Commonly known as:  LANTUS  Inject 64 Units into the skin at bedtime.     LINZESS 145 MCG Caps capsule  Generic drug:  linaclotide  Take 145 mcg by  mouth daily.     mirtazapine 15 MG tablet  Commonly known as:  REMERON  Take 15 mg by mouth at bedtime.     nitroGLYCERIN 0.4 MG SL tablet  Commonly known as:  NITROSTAT  Place 1 tablet (0.4 mg total) under the tongue every 5 (five) minutes as needed for chest pain (up to 3 doses).     NOVOLOG FLEXPEN 100 UNIT/ML FlexPen  Generic drug:  insulin aspart  Inject 40-50 Units into the skin 2 (two) times daily. Use 50 units in the morning and use 40 units in the evening     pantoprazole 40 MG tablet  Commonly known as:  PROTONIX  Take 1 tablet (40 mg total) by mouth daily.     potassium chloride SA 20 MEQ tablet  Commonly known as:  K-DUR,KLOR-CON  Take 1 tablet (20 mEq total) by mouth daily.     QUEtiapine 400 MG tablet  Commonly known as:  SEROQUEL  Take 400 mg by mouth at bedtime.     Vitamin D3 2000 units Tabs  Take 4,000 Units by mouth daily.       Follow-up Information    Follow up with Tinnie Gens, MD In 2 weeks.   Specialties:  Vascular Surgery, Interventional Cardiology, Cardiology   Why:  Office will call you to arrange your appt (sent)   Contact information:   [place] [address] [phone]       Follow up with Tharon Aquas Trigt III, MD In 4 weeks.   Specialty:  Cardiothoracic Surgery   Why:  Office will contact you with appointment, please get CXR at [place] 30 min prior to your appointment with Dr. Prescott Gum located on first floor of our office building   Contact information:   [address] [place] [address] [phone]      The patient has been discharged on:   1.Beta Blocker:  Yes [ x  ]                              No   [   ]  If No, reason:  2.Ace Inhibitor/ARB: Yes [   ]                                     No  [  x  ]                                     If No, reason: Elevated Creatinine  3.Statin:   Yes [   ]                  No  [ x  ]                  If No,  reason: Intolerance  4.[place]:  Yes  [ x  ]                  No   [   ]                  If No, reason:  Signed: BARRETT, ERIN 11/14/2015, 10:45 AM

## 2015-11-01 NOTE — Progress Notes (Signed)
TCTS BRIEF SICU PROGRESS NOTE  12 Days Post-Op  S/P Procedure(s) (LRB): REDO CORONARY ARTERY BYPASS GRAFTING (CABG), TIMES THREE, USING LEFT GREATER SAPHENOUS VEIN HARVESTED ENDOSCOPICALLY AND CRYO VEIN, WITH CORONARY ENDARTERECTOMY (N/A) TRANSESOPHAGEAL ECHOCARDIOGRAM (TEE) (N/A) ENDARTERECTOMY CAROTID (Right)   Stable day  Plan: Continue current plan  Purcell Nailslarence H Owen, MD 11/01/2015 10:54 PM

## 2015-11-01 NOTE — Progress Notes (Signed)
PARENTERAL NUTRITION CONSULT NOTE - INITIAL  Pharmacy Consult for TPN Indication: Intolerance to enteral feeding  Allergies  Allergen Reactions  . Metformin And Related Nausea And Vomiting  . Pravachol Nausea And Vomiting  . Ramipril Nausea Only  . Wellbutrin [Bupropion Hcl] Nausea And Vomiting    Patient Measurements: Height: '6\' 4"'  (193 cm) Weight: 294 lb 12.1 oz (133.7 kg) IBW/kg (Calculated) : 86.8  Adjusted body weight: 82 kg  Vital Signs: Temp: 98.5 F (36.9 C) (05/10 1149) Temp Source: Oral (05/10 1149) BP: 134/66 mmHg (05/10 1126) Pulse Rate: 97 (05/10 1126) Intake/Output from previous day: 05/09 0701 - 05/10 0700 In: 755 [I.V.:270; NG/GT:60; IV Piggyback:425] Out: 1975 [LZJQB:3419] Intake/Output from this shift: Total I/O In: 145 [I.V.:40; NG/GT:30; IV Piggyback:75] Out: 1500 [Urine:1500]  Labs:  Recent Labs  10/30/15 0344 10/31/15 0255 11/01/15 0230  WBC 5.5 7.6 6.3  HGB 9.3* 8.0* 9.3*  HCT 30.4* 25.4* 30.3*  PLT 156 233 216     Recent Labs  10/30/15 1028 10/31/15 0255 11/01/15 0230 11/01/15 1030  NA 138 139 141  --   K 3.9 3.9 4.2  --   CL 99* 101 105  --   CO2 '29 26 25  ' --   GLUCOSE 246* 186* 115*  --   BUN 22* 22* 22*  --   CREATININE 1.27* 1.15 1.11  --   CALCIUM 8.5* 8.6* 8.6*  --   MG  --   --  2.5*  --   PHOS  --   --  3.4  --   PROT  --   --   --  6.5  ALBUMIN  --   --   --  2.3*  AST  --   --   --  66*  ALT  --   --   --  83*  ALKPHOS  --   --   --  86  BILITOT  --   --   --  1.7*  BILIDIR  --   --   --  0.8*  IBILI  --   --   --  0.9  TRIG  --   --   --  130   Estimated Creatinine Clearance: 91.2 mL/min (by C-G formula based on Cr of 1.11).    Recent Labs  10/31/15 2334 11/01/15 0344 11/01/15 0808  GLUCAP 95 112* 104*    Medical History: Past Medical History  Diagnosis Date  . Essential hypertension   . Dyslipidemia   . Coronary artery disease     a. 2001: s/p CABG X4;  b. 08/2010 s/p PCI/BMS to VG->RI->OM;   c. 06/2014 Cath/PCI: LM nl, LAD 151m LCX 717mRI 100, OM1 100, OM2 80, small, RCA 100ost, VG->RCA 100, VG->Diag 100, VG->RI->OM1 70-80 ISR (4.0x24 Promus DES), LIMA->LAD nl.  . Morbid obesity ([place]  . Dysrhythmia   . History of blood transfusion 2001    "related to OHS"  . Anemia   . GERD (gastroesophageal reflux disease)   . Depression   . Anxiety   . Nephrolithiasis   . Complication of anesthesia     "they have a hard time waking him up"  . Type II diabetes mellitus ([place]  . Arthritis     "back, neck" (07/11/2014)  . Chronic lower back pain   . Thrombocytopenia ([place]    a. Borderline low platelets by labs noted in 06/2014 but also noted on prior labs as well.  . Renal insufficiency  a. noted 06/2014 - baseline unclear as no data since 2012.  Marland Kitchen RBBB     Medications:  Prescriptions prior to admission  Medication Sig Dispense Refill Last Dose  . amLODipine (NORVASC) 5 MG tablet Take 1 tablet (5 mg total) by mouth daily. 90 tablet 3 10/15/2015 at Unknown time  . Artificial Tear Ointment (DRY EYES OP) Place 1 drop into both eyes daily as needed (for dry eyes).   10/16/2015 at Unknown time  . Blood Glucose Monitoring Suppl (FREESTYLE FREEDOM) KIT    10/15/2015 at Unknown time  . BRILINTA 90 MG TABS tablet TAKE 1 TABLET BY MOUTH 2 TIMES DAILY. (Patient taking differently: TAKE 90 mg (1 TABLET) BY MOUTH 2 TIMES DAILY.) 60 tablet 9 10/15/2015 at 2100  . busPIRone (BUSPAR) 15 MG tablet Take 15 mg by mouth daily.   10/15/2015 at Unknown time  . carvedilol (COREG) 25 MG tablet Take 0.5 tablets (12.5 mg total) by mouth 2 (two) times daily with a meal. 60 tablet 6 10/15/2015 at Unknown time  . Cholecalciferol (VITAMIN D3) 2000 UNITS TABS Take 4,000 Units by mouth daily.    10/15/2015 at Unknown time  . CYMBALTA 60 MG capsule Take 60 mg by mouth daily.    10/15/2015 at Unknown time  . HYDROcodone-acetaminophen (NORCO) 10-325 MG per tablet Take 1-2 tablets by mouth every 6 (six) hours as needed. For  pain   10/15/2015 at Unknown time  . insulin glargine (LANTUS) 100 UNIT/ML injection Inject 64 Units into the skin at bedtime.    10/15/2015 at Unknown time  . isosorbide mononitrate (IMDUR) 60 MG 24 hr tablet Take 1 tablet (60 mg total) by mouth daily. 30 tablet 11 10/15/2015 at Unknown time  . LINZESS 145 MCG CAPS capsule Take 145 mcg by mouth daily.   10/15/2015 at Unknown time  . lisinopril (PRINIVIL,ZESTRIL) 5 MG tablet Take 5 mg by mouth daily.   10/15/2015 at Unknown time  . mirtazapine (REMERON) 15 MG tablet Take 15 mg by mouth at bedtime.    10/15/2015 at Unknown time  . nitroGLYCERIN (NITROSTAT) 0.4 MG SL tablet Place 1 tablet (0.4 mg total) under the tongue every 5 (five) minutes as needed for chest pain (up to 3 doses). 100 tablet 4 Taking  . NOVOLOG FLEXPEN 100 UNIT/ML FlexPen Inject 40-50 Units into the skin 2 (two) times daily. Use 50 units in the morning and use 40 units in the evening   10/15/2015 at Unknown time  . QUEtiapine (SEROQUEL) 400 MG tablet Take 400 mg by mouth at bedtime.   10/15/2015 at Unknown time    Insulin Requirements in the past 24 hours:  Levemir  35 units + ssi   Current Nutrition:  Vital HP at goal rate - turned off yesterday  Assessment: S/p CABG now with gastric dilatation had Cortrak, tube feeds now off, unable to tolerate enteral feeds.  Surgeries/Procedures: CABG + Carotid Enderartectomy 4/28 GI: Was on TF, now discontinued on ppi Endo: hx DM cbgs 97-213 on Levermir BID, ssi, Victoza  Lytes: Lytes wnl Renal: SCr 1.1, eCrCl 80 ml/min Pulm: RA Cards: CAD s/p CABG this admit, HTN, VSS   Zetia, lasix, hydralazine, lopressor Hepatobil: LFTs wnl, Tbili 1.7, TG 130 Neuro: hx depression, anxiety on Cymbalta, Buspar, Seroquel ID: Zosyn for serratia pneumonia Best Practices: Lovenox TPN Access: PICC 5/3 TPN start date: 5/10   Nutritional Goals:  Per RD  Plan:  -Initiate Clinimix E 5/15 @ 40 ml/hr -Add multivitamins and trace elements to TPN -  Hold  IVFA while in the ICU day #1   Harvel Quale 11/01/2015,11:55 AM

## 2015-11-01 NOTE — Progress Notes (Signed)
SLP Cancellation Note  Patient Details Name: Tyler Le MRN: [id number] DOB: 1945-10-12   Cancelled treatment:       Reason Eval/Treat Not Completed: Medical issues which prohibited therapy. Pt on trach collar this am, but RN reports increased, blood-tinged secretions after attempted NGT placement this am. Per discussion, will hold PO trials this am and reattempt at a later time.   Maxcine HamLaura Paiewonsky, M.A. CCC-SLP [phone]  Maxcine Hamaiewonsky, Youa Deloney 11/01/2015, 10:25 AM

## 2015-11-01 NOTE — Progress Notes (Signed)
Attempted to place NGT to LIWS to R nare at this time; unable to advance NG; now pt with blood clots, secretions in mouth; Dr. Donata ClayVan Trigt paged to make aware; new orders placed for nasal spray; pt mouth cleansed at this time; frequent oral suction done at this time by RN and pt; will cont. To monitor.  Benjamine SpragueYates, Oseas Detty A

## 2015-11-01 NOTE — Progress Notes (Signed)
12 Days Post-Op Procedure(s) (LRB): REDO CORONARY ARTERY BYPASS GRAFTING (CABG), TIMES THREE, USING LEFT GREATER SAPHENOUS VEIN HARVESTED ENDOSCOPICALLY AND CRYO VEIN, WITH CORONARY ENDARTERECTOMY (N/A) TRANSESOPHAGEAL ECHOCARDIOGRAM (TEE) (N/A) ENDARTERECTOMY CAROTID (Right) Subjective:  Redo CABG for subendocardial MI and unstable angina Combined right carotid endarterectomy for bilateral 90% carotid stenosis Postoperative ventilator dependence for preop COPD Postop gram-negative pneumonia Serratia on IV Zosyn Postop gastric dilatation on Reglan, tube feeds on hold LTAC evaluation process for long-term ventilator-trach wean Patient is neuro intact but has not yet been out of bed to chair Objective: Vital signs in last 24 hours: Temp:  [98.4 F (36.9 C)-99.6 F (37.6 C)] 98.4 F (36.9 C) (05/10 0811) Pulse Rate:  [86-104] 102 (05/10 0800) Cardiac Rhythm:  [-] Sinus tachycardia;Bundle branch block (05/10 0800) Resp:  [15-36] 23 (05/10 0800) BP: (113-173)/(56-97) 173/90 mmHg (05/10 0800) SpO2:  [94 %-99 %] 96 % (05/10 0800) FiO2 (%):  [50 %-60 %] 50 % (05/10 0800) Weight:  [294 lb 12.1 oz (133.7 kg)] 294 lb 12.1 oz (133.7 kg) (05/10 0200)  Hemodynamic parameters for last 24 hours:  hypertensive, sinus tach, afebrile after starting Zosyn for gram-negative pneumonia  Intake/Output from previous day: 05/09 0701 - 05/10 0700 In: 755 [I.V.:270; NG/GT:60; IV Piggyback:425] Out: 1975 [Urine:1975] Intake/Output this shift: Total I/O In: 22.5 [I.V.:10; IV Piggyback:12.5] Out: 150 [Urine:150]  Alert and responsive Scattered rhonchi Abdomen slightly distended but nontender Extremities warm with mild edema Surgical incisions clean and dry Neuro intact  Lab Results:  Recent Labs  10/31/15 0255 11/01/15 0230  WBC 7.6 6.3  HGB 8.0* 9.3*  HCT 25.4* 30.3*  PLT 233 216   BMET:  Recent Labs  10/31/15 0255 11/01/15 0230  NA 139 141  K 3.9 4.2  CL 101 105  CO2 26 25   GLUCOSE 186* 115*  BUN 22* 22*  CREATININE 1.15 1.11  CALCIUM 8.6* 8.6*    PT/INR: No results for input(s): LABPROT, INR in the last 72 hours. ABG    Component Value Date/Time   PHART 7.305* 10/28/2015 0527   HCO3 34.6* 10/28/2015 0527   TCO2 27 10/29/2015 1619   ACIDBASEDEF 1.0 10/20/2015 1824   O2SAT 67.0 10/31/2015 0345   CBG (last 3)   Recent Labs  10/31/15 2009 10/31/15 2334 11/01/15 0344  GLUCAP 101* 95 112*    Assessment/Plan: S/P Procedure(s) (LRB): REDO CORONARY ARTERY BYPASS GRAFTING (CABG), TIMES THREE, USING LEFT GREATER SAPHENOUS VEIN HARVESTED ENDOSCOPICALLY AND CRYO VEIN, WITH CORONARY ENDARTERECTOMY (N/A) TRANSESOPHAGEAL ECHOCARDIOGRAM (TEE) (N/A) ENDARTERECTOMY CAROTID (Right)  Postop COPD than later dependent with trach now gram-negative pneumonia Gastric dilatation Poor nutrition postop-we'll start TPN Mobilize out of bed to chair with physical therapy assistance Proceed with LTAC evaluation   LOS: 16 days    Kathlee Nationseter Van Trigt III 11/01/2015

## 2015-11-01 NOTE — Progress Notes (Signed)
Name: Tyler Le MRN: [id number] DOB: 1945-12-20    ADMISSION DATE:  10/16/2015 CONSULTATION DATE:  10/18/15  REFERRING MD : Donata Clay, MD  CHIEF COMPLAINT:  Hypercapnia   SUBJECTIVE:    Failed TC yesterday and had to be placed on the vent.  Abdominal dilation noted, unable to insert NGT.   VITAL SIGNS: Temp:  [98.4 F (36.9 C)-99.6 F (37.6 C)] 98.4 F (36.9 C) (05/10 0811) Pulse Rate:  [86-104] 98 (05/10 1000) Resp:  [15-34] 27 (05/10 1000) BP: (113-173)/(56-97) 155/75 mmHg (05/10 1000) SpO2:  [94 %-99 %] 96 % (05/10 1000) FiO2 (%):  [50 %-60 %] 60 % (05/10 0832) Weight:  [133.7 kg (294 lb 12.1 oz)] 133.7 kg (294 lb 12.1 oz) (05/10 0200)  Intake/Output Summary (Last 24 hours) at 11/01/15 1025 Last data filed at 11/01/15 1000  Gross per 24 hour  Intake    760 ml  Output   2615 ml  Net  -1855 ml   PHYSICAL EXAMINATION: General:  Obese male; awake, NAD on TC. HENT: NCAT; trach unremarkable right neck staples intact  PULM: resps even non labored on ATC, coarse, decreased bases CV: RRR, no clear murmur GI: BS+, soft MSK: normal bulk and tone, moves all extremities Neuro: awake, alert, follows commands  Recent Labs Lab 10/30/15 1028 10/31/15 0255 11/01/15 0230  NA 138 139 141  K 3.9 3.9 4.2  CL 99* 101 105  CO2 29 26 25   BUN 22* 22* 22*  CREATININE 1.27* 1.15 1.11  GLUCOSE 246* 186* 115*    Recent Labs Lab 10/30/15 0344 10/31/15 0255 11/01/15 0230  HGB 9.3* 8.0* 9.3*  HCT 30.4* 25.4* 30.3*  WBC 5.5 7.6 6.3  PLT 156 233 216   Dg Chest Port 1 View  11/01/2015  CLINICAL DATA:  Ileus. EXAM: PORTABLE CHEST 1 VIEW COMPARISON:  Oct 31, 2015. FINDINGS: Stable cardiomegaly. Status post coronary artery bypass graft. Tracheostomy and feeding tube appear unchanged. Right-sided PICC line appears unchanged. No pneumothorax is noted. Right lung is clear. Stable left basilar opacity is noted concerning for atelectasis and possible effusion. Bony thorax is  unremarkable. IMPRESSION: Stable support apparatus. Stable left basilar opacity as described above. Electronically Signed   By: Lupita Raider, M.D.   On: 11/01/2015 07:54   Dg Chest Port 1 View  10/31/2015  CLINICAL DATA:  Status post CABG on 28 Oct 2015 EXAM: PORTABLE CHEST 1 VIEW COMPARISON:  Portable chest x-ray of Oct 30, 2015.  Ste FINDINGS: The lungs are reasonably well inflated. The retrocardiac region on the left remains dense. The pulmonary interstitial markings are slightly more conspicuous bilaterally today. There is a small left pleural effusion. The cardiac silhouette remains enlarged. The tracheostomy appliance tube tip projects at the superior margin of the clavicular heads. The feeding tube tip projects below the inferior margin of the image. The right-sided PICC line tip projects over the midportion of the SVC. The sternal wires are intact where visualized. IMPRESSION: Persistent left lower lobe atelectasis. Mild pulmonary interstitial edema and tiny left pleural effusion. The support tubes are in stable position. Electronically Signed   By: David  [place] M.D.   On: 10/31/2015 08:02   Dg Abd Portable 1v  11/01/2015  CLINICAL DATA:  Follow-up ileus EXAM: PORTABLE ABDOMEN - 1 VIEW COMPARISON:  AP supine abdominal film dated Oct 31, 2015. FINDINGS: The gastric tube tip projects in the region of the pylorus and is stable. There remains a moderate amount of gas within mildly  distended small and large bowel loops. There has not been dramatic change since yesterday's study. No extraluminal gas collections are observed. IMPRESSION: Stable appearance of the mildly distended stomach as well as numerous loops of small and large bowel. Electronically Signed   By: David  [place] M.D.   On: 11/01/2015 07:55   Dg Abd Portable 1v  10/31/2015  CLINICAL DATA:  Abdominal distention EXAM: PORTABLE ABDOMEN - 1 VIEW COMPARISON:  Oct 30, 2015 FINDINGS: Nasogastric tube tip is in the first portion of the duodenum.  There is moderate gas distention of the stomach with air. There is no appreciable small or large bowel dilatation. No free air is evident. Temporary pacemaker wires are noted. IMPRESSION: Nasogastric tube tip in first portion of duodenum. Stomach distended with air pre no small or large bowel obstruction evident. No free air is appreciable on this supine examination. Electronically Signed   By: Bretta Bang III M.D.   On: 10/31/2015 13:02   Dg Abd Portable 1v  10/30/2015  CLINICAL DATA:  Evaluate NG tube placement EXAM: PORTABLE ABDOMEN - 1 VIEW COMPARISON:  10/29/15 FINDINGS: The feeding tube tip is below the level of the GE junction and appears to be in the expected location of the gastric antrum or pylorus. IMPRESSION: 1. Tip of feeding tube is well below GE junction. Electronically Signed   By: Signa Kell M.D.   On: 10/30/2015 17:45  CXR w/ improved aeration   STUDIES:  PFT 4/25 >> restrictive lung dz, low DLCO diffusion LE Doppler 5/2 >>  negative  CULTURES: Sputum 5/5 >>few yeast consistent with candida species Sputum 5/6>>> BC 5/6>>> Urine 5/6>>>GNR>>>  ANTIBIOTICS: Cefuroxime 4/28 >> 4/30 vanc 5/6>> Zosyn 5/6>>>  SIGNIFICANT EVENTS: 4/28 CABG and CEA 5/01 Bradycardia / hypotension with any weaning of sedation 5/02 Episodes of desaturation, increased O2 needs 5/03 Weaned off dopamine, neo, sedation reduced 5/5 trach.  5/6 fevers, still on fio2 60%/peep 8. ABX started; lasix cut back to daily  5/7 Creatinine up. Lasix stopped. Weaning on PS 10/peep 8, fent gtt stopped and changed to PRN  LINES/TUBES: 4/28  midine R arm >>5/1  4/28  L subclavian introducer >  4/28  R neck drain >> 4/30 4/28  mediastinal chest tubes >> 4/30 5/01  PICC >> Trach 5/5 (JY)>>>  ASSESSMENT / PLAN:  DISCUSSION: 70 y/o male with obesity hypoventilation syndrome who underwent a redo CABG and CEA on 4/28. Remains on vent post-operatively - anxiety/encephalopathy, bradycardia/hypotension as  barriers to extubation but these are improving. S/p trach 5/5. ABX started on 5/6. Tolerating ATC trials.   ASSESSMENT / PLAN:  PULMONARY A: Vent dependence  Chronic respiratory failure with hypercapnea > improving Obesity hypoventilation syndrome Tracheostomy status. S/p trach 5/5 P:   TC as tolerated. Vent at night only if tires out. Limit sedation. Mobilize as able - PT involved. No plans for decannulation at this point. Intermittent f/u CXR. Speech therapy involved for PVM.  NEUROLOGIC A:   Acute encephalopathy > improving slowly Baseline mental illness, poorly understood, multiple medications at home Chronic pain  MS improved as of 5/7. Wonder if fever +/- some level of infection was contributing factor P:   RASS goal: now 0 to -1 PRN fentanyl  Decreased seroquel to 100 BID and follow QTc interval closely, further titrate as able Continue cymbalta, buspar Mobilize  CARDIOVASCULAR A:  S/p CABG and (R) CEA P:  Per vascular and TCTS Monitor hemodynamics Tele  RENAL A:   AKI - initially improving then  Scr rising 5/7.  Improved 5/8 off diuresis.   P:   Monitor BMET and UOP Scheduled lasix d/c'd 5/7 Replace electrolytes as needed    GASTROINTESTINAL A:   Morbid Obesity  P:   Cortrak placed 5/1, continue TF PPI. Reglan scheduled. Nutrition per surgery. TPN as ordered by CVTS.  HEMATOLOGIC A:   Anemia, no bleeding Thrombocytopenia - resolved  P:  Transfusion per usual ICU guidelines Lovenox  CBC in AM.  INFECTIOUS A:   Recurrent fever 5/6, normal WBC;  No infiltrate on CXR 5/7 P:   Sputum and blood sent 5/6; nothing to date  Vanc/zosyn 5/6>>>  ENDOCRINE A:   Hyperglycemia P:   Continue home liraglutide, SSI, levemir  FAMILY  - Updates: pt and wife updated at bedside.  The patient is critically ill with multiple organ systems failure and requires high complexity decision making for assessment and support, frequent evaluation and  titration of therapies, application of advanced monitoring technologies and extensive interpretation of multiple databases.   Critical Care Time devoted to patient care services described in this note is  35  Minutes. This time reflects time of care of this signee Dr Koren Bound. This critical care time does not reflect procedure time, or teaching time or supervisory time of PA/NP/Med student/Med Resident etc but could involve care discussion time.  Alyson Reedy, M.D. [facility] Pulmonary/Critical Care Medicine. Pager: [phone]. After hours pager: [phone].

## 2015-11-02 ENCOUNTER — Inpatient Hospital Stay (HOSPITAL_COMMUNITY): Payer: Medicare Other

## 2015-11-02 LAB — GLUCOSE, CAPILLARY
Glucose-Capillary: 140 mg/dL — ABNORMAL HIGH (ref 65–99)
Glucose-Capillary: 154 mg/dL — ABNORMAL HIGH (ref 65–99)
Glucose-Capillary: 163 mg/dL — ABNORMAL HIGH (ref 65–99)
Glucose-Capillary: 193 mg/dL — ABNORMAL HIGH (ref 65–99)
Glucose-Capillary: 198 mg/dL — ABNORMAL HIGH (ref 65–99)
Glucose-Capillary: 203 mg/dL — ABNORMAL HIGH (ref 65–99)

## 2015-11-02 LAB — CBC
HCT: 46.3 % (ref 39.0–52.0)
Hemoglobin: 14.6 g/dL (ref 13.0–17.0)
MCH: 29.8 pg (ref 26.0–34.0)
MCHC: 31.5 g/dL (ref 30.0–36.0)
MCV: 94.5 fL (ref 78.0–100.0)
Platelets: 181 10*3/uL (ref 150–400)
RBC: 4.9 MIL/uL (ref 4.22–5.81)
RDW: 15.1 % (ref 11.5–15.5)
WBC: 3.7 10*3/uL — ABNORMAL LOW (ref 4.0–10.5)

## 2015-11-02 LAB — COMPREHENSIVE METABOLIC PANEL
ALT: 76 U/L — ABNORMAL HIGH (ref 17–63)
AST: 52 U/L — ABNORMAL HIGH (ref 15–41)
Albumin: 2.6 g/dL — ABNORMAL LOW (ref 3.5–5.0)
Alkaline Phosphatase: 84 U/L (ref 38–126)
Anion gap: 10 (ref 5–15)
BUN: 23 mg/dL — ABNORMAL HIGH (ref 6–20)
CO2: 27 mmol/L (ref 22–32)
Calcium: 8.6 mg/dL — ABNORMAL LOW (ref 8.9–10.3)
Chloride: 102 mmol/L (ref 101–111)
Creatinine, Ser: 1.21 mg/dL (ref 0.61–1.24)
GFR calc Af Amer: >60 mL/min (ref >60)
GFR calc non Af Amer: 58 mL/min — ABNORMAL LOW (ref >60)
Glucose, Bld: 207 mg/dL — ABNORMAL HIGH (ref 65–99)
Potassium: 4.3 mmol/L (ref 3.5–5.1)
Sodium: 139 mmol/L (ref 135–145)
Total Bilirubin: 1.6 mg/dL — ABNORMAL HIGH (ref 0.3–1.2)
Total Protein: 6.7 g/dL (ref 6.5–8.1)

## 2015-11-02 LAB — CULTURE, BLOOD (SINGLE): Culture: NO GROWTH

## 2015-11-02 LAB — MAGNESIUM: Magnesium: 2.6 mg/dL — ABNORMAL HIGH (ref 1.7–2.4)

## 2015-11-02 LAB — PHOSPHORUS: Phosphorus: 3.9 mg/dL (ref 2.5–4.6)

## 2015-11-02 MED ORDER — QUETIAPINE FUMARATE 25 MG PO TABS
50.0000 mg | ORAL_TABLET | Freq: Two times a day (BID) | ORAL | Status: DC
  Administered 2015-11-03 (×2): 50 mg via ORAL
  Filled 2015-11-02 (×2): qty 2

## 2015-11-02 MED ORDER — IPRATROPIUM BROMIDE 0.02 % IN SOLN
0.5000 mg | Freq: Three times a day (TID) | RESPIRATORY_TRACT | Status: DC
  Administered 2015-11-02 – 2015-11-07 (×15): 0.5 mg via RESPIRATORY_TRACT
  Filled 2015-11-02 (×15): qty 2.5

## 2015-11-02 MED ORDER — HYDROCODONE-ACETAMINOPHEN 10-325 MG PO TABS
1.0000 | ORAL_TABLET | Freq: Four times a day (QID) | ORAL | Status: DC | PRN
  Administered 2015-11-05 – 2015-11-14 (×21): 1 via ORAL
  Filled 2015-11-02 (×21): qty 1

## 2015-11-02 MED ORDER — TRACE MINERALS CR-CU-MN-SE-ZN 10-1000-500-60 MCG/ML IV SOLN
INTRAVENOUS | Status: AC
  Administered 2015-11-02: 17:00:00 via INTRAVENOUS
  Filled 2015-11-02: qty 1680

## 2015-11-02 MED ORDER — DEXTROSE 5 % IV SOLN
2.0000 g | INTRAVENOUS | Status: DC
  Administered 2015-11-02 – 2015-11-08 (×7): 2 g via INTRAVENOUS
  Filled 2015-11-02 (×8): qty 2

## 2015-11-02 MED ORDER — LEVALBUTEROL HCL 0.63 MG/3ML IN NEBU: 0.6300 mg | INHALATION_SOLUTION | Freq: Four times a day (QID) | RESPIRATORY_TRACT | Status: DC | PRN

## 2015-11-02 MED ORDER — LEVALBUTEROL HCL 1.25 MG/0.5ML IN NEBU
1.2500 mg | INHALATION_SOLUTION | Freq: Three times a day (TID) | RESPIRATORY_TRACT | Status: DC
  Administered 2015-11-02 – 2015-11-07 (×16): 1.25 mg via RESPIRATORY_TRACT
  Filled 2015-11-02 (×16): qty 0.5

## 2015-11-02 NOTE — Plan of Care (Signed)
Problem: Activity: Goal: Risk for activity intolerance will decrease Outcome: Progressing Pt up to chair today with PT. First time OOB since surgery.  Problem: Pain Management: Goal: Pain level will decrease Outcome: Progressing No complaints of pain from patient all day 5/11.

## 2015-11-02 NOTE — Progress Notes (Signed)
Physical Therapy Treatment Patient Details Name: VA CHUKWU MRN: [id number] DOB: 09-10-1945 Today's Date: 11/02/2015    History of Present Illness pt presents with Cardiac Arrest and Redo of CABG x3.  pt intubated 4/24 and with Trach on 5/5.  pt with hx of CABG, Anxiety, HTN, DM, CAD, Renal Insufficiency, and Back Surgeries.      PT Comments    Pt more drowsy than previous sessions, but does open eyes and nods agreement for OOB to chair.  While rolling pt for placement of lift pad, pt desats to 40's while on trach collar.  His O2 sats increase when HOB elevated.  Continue to feel pt will need extensive rehab at either SNF or Ltach level.  Will continue to follow.    Follow Up Recommendations  SNF (vs Lt[facility])     Equipment Recommendations  None recommended by PT    Recommendations for Other Services       Precautions / Restrictions Precautions Precautions: Fall;Sternal Precaution Comments: Trach collar, Watch O2 sats Restrictions Weight Bearing Restrictions: No    Mobility  Bed Mobility Overal bed mobility: Needs Assistance;+2 for physical assistance Bed Mobility: Rolling Rolling: Max assist;+2 for physical assistance         General bed mobility comments: pt did A with bending his knees in preparation for rolling.  Extensive 2 person A for rolling.  While rolling to place left pad under pt, pt began to desat while on trach collar down to 40's.  Elevated HOB and sats returned to 90's.    Transfers                 General transfer comment: Utilized maximove to lift pt up to recliner.  pt tolerated lift well, maintaining sats.    Ambulation/Gait                 Stairs            Wheelchair Mobility    Modified Rankin (Stroke Patients Only)       Balance                                    Cognition Arousal/Alertness:  (Drowsy, but arousable.) Behavior During Therapy: Flat affect Overall Cognitive Status: Difficult to  assess                      Exercises      General Comments        Pertinent Vitals/Pain Pain Assessment: No/denies pain    Home Living                      Prior Function            PT Goals (current goals can now be found in the care plan section) Acute Rehab PT Goals Patient Stated Goal: Per wife to recover and come home. PT Goal Formulation: With patient/family Time For Goal Achievement: 11/12/15 Potential to Achieve Goals: Good Progress towards PT goals: Progressing toward goals    Frequency  Min 2X/week    PT Plan Frequency needs to be updated    Co-evaluation             End of Session Equipment Utilized During Treatment: Oxygen Activity Tolerance: Patient limited by fatigue Patient left: in chair;with call bell/phone within reach;with nursing/sitter in room     Time: 1137-1200 PT Time Calculation (  min) (ACUTE ONLY): 23 min  Charges:  $Therapeutic Activity: 23-37 mins                    G CodesSunny Schlein, [place] [phone] 11/02/2015, 12:52 PM

## 2015-11-02 NOTE — Progress Notes (Signed)
      [address]       [place] [id number]             [phone]      PM ROUNDS  Up in chair, on trach collar  BP 133/62 mmHg  Pulse 99  Temp(Src) 98.3 F (36.8 C) (Oral)  Resp 33  Ht 6\' 4"  (1.93 m)  Wt 279 lb 15.8 oz (127 kg)  BMI 34.09 kg/m2  SpO2 98%   Intake/Output Summary (Last 24 hours) at 11/02/15 1739 Last data filed at 11/02/15 1700  Gross per 24 hour  Intake 1303.33 ml  Output   3220 ml  Net -1916.67 ml    Continue current care  Keela Rubert C. Dorris FetchHendrickson, MD [facility] [phone]

## 2015-11-02 NOTE — Progress Notes (Signed)
Name: Tyler Le MRN: [id number] DOB: 1945-10-13    ADMISSION DATE:  10/16/2015 CONSULTATION DATE:  10/18/15  REFERRING MD : Donata Clay, MD  CHIEF COMPLAINT:  Hypercapnia  SUBJECTIVE:    Sleepy this am but tol ATC.  No c/o.    VITAL SIGNS: Temp:  [98.1 F (36.7 C)-99.4 F (37.4 C)] 99.3 F (37.4 C) (05/11 0821) Pulse Rate:  [83-102] 95 (05/11 0900) Resp:  [18-32] 31 (05/11 0900) BP: (118-170)/(58-89) 145/73 mmHg (05/11 0900) SpO2:  [92 %-99 %] 97 % (05/11 0900) FiO2 (%):  [60 %] 60 % (05/11 0800) Weight:  [127 kg (279 lb 15.8 oz)] 127 kg (279 lb 15.8 oz) (05/11 0600)  Intake/Output Summary (Last 24 hours) at 11/02/15 0926 Last data filed at 11/02/15 0900  Gross per 24 hour  Intake 1433.33 ml  Output   4170 ml  Net -2736.67 ml   PHYSICAL EXAMINATION: General:  Obese male; sleepy, NAD on TC. HENT: NCAT; trach unremarkable right neck staples intact  PULM: resps even non labored on ATC, coarse, decreased bases CV: RRR, no clear murmur GI: BS+, soft MSK: normal bulk and tone, moves all extremities Neuro: sleepy but easily wakes, follows commands  Recent Labs Lab 10/31/15 0255 11/01/15 0230 11/02/15 0302  NA 139 141 139  K 3.9 4.2 4.3  CL 101 105 102  CO2 26 25 27   BUN 22* 22* 23*  CREATININE 1.15 1.11 1.21  GLUCOSE 186* 115* 207*    Recent Labs Lab 10/31/15 0255 11/01/15 0230 11/02/15 0302  HGB 8.0* 9.3* 14.6  HCT 25.4* 30.3* 46.3  WBC 7.6 6.3 3.7*  PLT 233 216 181   Dg Chest Port 1 View  11/02/2015  CLINICAL DATA:  Known ileus EXAM: PORTABLE CHEST 1 VIEW COMPARISON:  11/01/2015 FINDINGS: Tracheostomy tube, feeding catheter and right-sided PICC line are again identified and stable. Cardiac shadow remains enlarged. Postsurgical changes are again seen. Mild central vascular congestion is noted. Stable opacities are noted in the left base. No new focal abnormality is seen. IMPRESSION: Stable changes in the left base. Mild central vascular congestion is  now seen. Electronically Signed   By: Alcide Clever M.D.   On: 11/02/2015 07:59   Dg Chest Port 1 View  11/01/2015  CLINICAL DATA:  Ileus. EXAM: PORTABLE CHEST 1 VIEW COMPARISON:  Oct 31, 2015. FINDINGS: Stable cardiomegaly. Status post coronary artery bypass graft. Tracheostomy and feeding tube appear unchanged. Right-sided PICC line appears unchanged. No pneumothorax is noted. Right lung is clear. Stable left basilar opacity is noted concerning for atelectasis and possible effusion. Bony thorax is unremarkable. IMPRESSION: Stable support apparatus. Stable left basilar opacity as described above. Electronically Signed   By: Lupita Raider, M.D.   On: 11/01/2015 07:54   Dg Abd Portable 1v  11/02/2015  CLINICAL DATA:  Ileus EXAM: PORTABLE ABDOMEN - 1 VIEW COMPARISON:  11/01/2015 FINDINGS: Feeding catheter is noted in place in the distal duodenum/ proximal jejunum. Scattered large and small bowel gas is noted. The degree of small bowel dilatation is increased slightly in the interval from the prior exam. Correlation with clinical exam is recommended. No free air is seen. Degenerative change of the lumbar spine is noted. IMPRESSION: Slight increase in the degree of small bowel dilatation. Electronically Signed   By: Alcide Clever M.D.   On: 11/02/2015 08:00   Dg Abd Portable 1v  11/01/2015  CLINICAL DATA:  Follow-up ileus EXAM: PORTABLE ABDOMEN - 1 VIEW COMPARISON:  AP supine abdominal  film dated Oct 31, 2015. FINDINGS: The gastric tube tip projects in the region of the pylorus and is stable. There remains a moderate amount of gas within mildly distended small and large bowel loops. There has not been dramatic change since yesterday's study. No extraluminal gas collections are observed. IMPRESSION: Stable appearance of the mildly distended stomach as well as numerous loops of small and large bowel. Electronically Signed   By: David  [place] M.D.   On: 11/01/2015 07:55   Dg Abd Portable 1v  10/31/2015  CLINICAL  DATA:  Abdominal distention EXAM: PORTABLE ABDOMEN - 1 VIEW COMPARISON:  Oct 30, 2015 FINDINGS: Nasogastric tube tip is in the first portion of the duodenum. There is moderate gas distention of the stomach with air. There is no appreciable small or large bowel dilatation. No free air is evident. Temporary pacemaker wires are noted. IMPRESSION: Nasogastric tube tip in first portion of duodenum. Stomach distended with air pre no small or large bowel obstruction evident. No free air is appreciable on this supine examination. Electronically Signed   By: Bretta Bang III M.D.   On: 10/31/2015 13:02  CXR w/ improved aeration   STUDIES:  PFT 4/25 >> restrictive lung dz, low DLCO diffusion LE Doppler 5/2 >>  negative  CULTURES: Sputum 5/5 >>few yeast consistent with candida species Sputum 5/6>>>serratia>>> RES cefazolin, otherwise sens  BC 5/6>>> Urine 5/6>>>GNR>>>Serratia>>>RES cefazolin and nitrofurantoin   ANTIBIOTICS: Cefuroxime 4/28 >> 4/30 vanc 5/6>>off  Zosyn 5/6>>>5/11 Ceftriaxone 5/11>>>  SIGNIFICANT EVENTS: 4/28 CABG and CEA 5/01 Bradycardia / hypotension with any weaning of sedation 5/02 Episodes of desaturation, increased O2 needs 5/03 Weaned off dopamine, neo, sedation reduced 5/5 trach.  5/6 fevers, still on fio2 60%/peep 8. ABX started; lasix cut back to daily  5/7 Creatinine up. Lasix stopped. Weaning on PS 10/peep 8, fent gtt stopped and changed to PRN  LINES/TUBES: 4/28  midine R arm >>5/1  4/28  L subclavian introducer >  4/28  R neck drain >> 4/30 4/28  mediastinal chest tubes >> 4/30 5/01  PICC >> Trach 5/5 (JY)>>>  ASSESSMENT / PLAN:  DISCUSSION: 70 y/o male with obesity hypoventilation syndrome who underwent a redo CABG and CEA on 4/28. Remains on vent post-operatively - anxiety/encephalopathy, bradycardia/hypotension as barriers to extubation but these are improving. S/p trach 5/5. ABX started on 5/6. Tolerating ATC trials.   ASSESSMENT /  PLAN:  PULMONARY A: Vent dependence  Chronic respiratory failure with hypercapnea > improving Obesity hypoventilation syndrome Tracheostomy status. S/p trach 5/5 Serratia PNA P:   TC as tolerated. Vent at night only if tires out. Limit sedation. Mobilize as able - PT involved. No plans for decannulation at this point. Intermittent f/u CXR. Speech therapy involved for PVM/swallow eval  abx as above for serratia   NEUROLOGIC A:   Acute encephalopathy > improving slowly Baseline mental illness, poorly understood, multiple medications at home Chronic pain  MS improved as of 5/7. Wonder if fever +/- some level of infection was contributing factor P:   RASS goal: now 0 to -1 PRN fentanyl  Decrease seroquel to 50 BID on 5/11 and follow QTc interval closely, further titrate as able Continue cymbalta, buspar Mobilize  CARDIOVASCULAR A:  S/p CABG and (R) CEA P:  Per vascular and TCTS Monitor hemodynamics Tele  RENAL A:   AKI - initially improving then Scr rising 5/7.  Improved 5/8 off diuresis.  P:   Monitor BMET and UOP Replace electrolytes as needed   Consider resume  low dose lasix 5/12  GASTROINTESTINAL A:   Morbid Obesity  P:   Cortrak placed 5/1, continue TF PPI. Reglan scheduled. TPN as ordered by CVTS. Swallow eval per Speech   HEMATOLOGIC A:   Anemia, no bleeding Thrombocytopenia - resolved  P:  Transfusion per usual ICU guidelines Lovenox  CBC in AM.  INFECTIOUS A:   Recurrent fever 5/6, normal WBC;  No infiltrate on CXR 5/7 Serratia UTI  Serratia PNA P:   Narrow abx to ceftriaxone 5/11  ENDOCRINE A:   Hyperglycemia P:   Continue home liraglutide, SSI, levemir  FAMILY  - Updates: pt and wife updated at bedside 5/11.   Dirk DressKaty Whiteheart, NP 11/02/2015  9:27 AM Pager: [phone] or [phone]  Attending Note:  70 year old male s/p CABG, persistent respiratory failure, failed to wean, extubated.  Continues to be on and off  the vent.  Improving however.  Will attempt to keep off the ventilator tonight.  Continue diureses to dry weight.  Continue rehab efforts.  No plans on decannulation today.  On exam, lungs are clear to auscultation.  Hold in ICU for today.  The patient is critically ill with multiple organ systems failure and requires high complexity decision making for assessment and support, frequent evaluation and titration of therapies, application of advanced monitoring technologies and extensive interpretation of multiple databases.   Critical Care Time devoted to patient care services described in this note is  35  Minutes. This time reflects time of care of this signee Dr Koren BoundWesam Yacoub. This critical care time does not reflect procedure time, or teaching time or supervisory time of PA/NP/Med student/Med Resident etc but could involve care discussion time.  Alyson ReedyWesam G. Yacoub, M.D. [facility] Pulmonary/Critical Care Medicine. Pager: [phone]. After hours pager: [phone].

## 2015-11-02 NOTE — Progress Notes (Signed)
Pt sitting in chair at this time; upon entering room Cortrak found to be pulled out by pt; will replace Cortrak in AM; will cont. To monitor.  Benjamine SpragueYates, Larsen Zettel A

## 2015-11-02 NOTE — Progress Notes (Signed)
PARENTERAL NUTRITION CONSULT NOTE - Follow up  Pharmacy Consult for TPN Indication: Intolerance to enteral feeding  Allergies  Allergen Reactions  . Metformin And Related Nausea And Vomiting  . Pravachol Nausea And Vomiting  . Ramipril Nausea Only  . Wellbutrin [Bupropion Hcl] Nausea And Vomiting    Patient Measurements: Height: 6' 4" (193 cm) Weight: 279 lb 15.8 oz (127 kg) IBW/kg (Calculated) : 86.8  Adjusted body weight: 82 kg  Vital Signs: Temp: 99.3 F (37.4 C) (05/11 0821) Temp Source: Oral (05/11 0821) BP: 161/82 mmHg (05/11 0800) Pulse Rate: 91 (05/11 0800) Intake/Output from previous day: 05/10 0701 - 05/11 0700 In: 1378.3 [I.V.:150; NG/GT:60; IV Piggyback:625; TPN:543.3] Out: 3970 [Urine:3970] Intake/Output from this shift: Total I/O In: 50 [I.V.:10; TPN:40] Out: -   Labs:  Recent Labs  10/31/15 0255 11/01/15 0230 11/02/15 0302  WBC 7.6 6.3 3.7*  HGB 8.0* 9.3* 14.6  HCT 25.4* 30.3* 46.3  PLT 233 216 181     Recent Labs  10/31/15 0255 11/01/15 0230 11/01/15 1030 11/02/15 0302  NA 139 141  --  139  K 3.9 4.2  --  4.3  CL 101 105  --  102  CO2 26 25  --  27  GLUCOSE 186* 115*  --  207*  BUN 22* 22*  --  23*  CREATININE 1.15 1.11  --  1.21  CALCIUM 8.6* 8.6*  --  8.6*  MG  --  2.5*  --  2.6*  PHOS  --  3.4  --  3.9  PROT  --   --  6.5 6.7  ALBUMIN  --   --  2.3* 2.6*  AST  --   --  66* 52*  ALT  --   --  83* 76*  ALKPHOS  --   --  86 84  BILITOT  --   --  1.7* 1.6*  BILIDIR  --   --  0.8*  --   IBILI  --   --  0.9  --   PREALBUMIN  --   --  10.9*  --   TRIG  --   --  130  --    Estimated Creatinine Clearance: 81.5 mL/min (by C-G formula based on Cr of 1.21).    Recent Labs  11/01/15 2347 11/02/15 0402 11/02/15 0824  GLUCAP 154* 140* 163*    Medical History: Past Medical History  Diagnosis Date  . Essential hypertension   . Dyslipidemia   . Coronary artery disease     a. 2001: s/p CABG X4;  b. 08/2010 s/p PCI/BMS to  VG->RI->OM;  c. 06/2014 Cath/PCI: LM nl, LAD 156m LCX 737mRI 100, OM1 100, OM2 80, small, RCA 100ost, VG->RCA 100, VG->Diag 100, VG->RI->OM1 70-80 ISR (4.0x24 Promus DES), LIMA->LAD nl.  . Morbid obesity ([place]  . Dysrhythmia   . History of blood transfusion 2001    "related to OHS"  . Anemia   . GERD (gastroesophageal reflux disease)   . Depression   . Anxiety   . Nephrolithiasis   . Complication of anesthesia     "they have a hard time waking him up"  . Type II diabetes mellitus ([place]  . Arthritis     "back, neck" (07/11/2014)  . Chronic lower back pain   . Thrombocytopenia ([place]    a. Borderline low platelets by labs noted in 06/2014 but also noted on prior labs as well.  . Renal insufficiency     a. noted  06/2014 - baseline unclear as no data since 2012.  Marland Kitchen RBBB     Medications:  Prescriptions prior to admission  Medication Sig Dispense Refill Last Dose  . amLODipine (NORVASC) 5 MG tablet Take 1 tablet (5 mg total) by mouth daily. 90 tablet 3 10/15/2015 at Unknown time  . Artificial Tear Ointment (DRY EYES OP) Place 1 drop into both eyes daily as needed (for dry eyes).   10/16/2015 at Unknown time  . Blood Glucose Monitoring Suppl (FREESTYLE FREEDOM) KIT    10/15/2015 at Unknown time  . BRILINTA 90 MG TABS tablet TAKE 1 TABLET BY MOUTH 2 TIMES DAILY. (Patient taking differently: TAKE 90 mg (1 TABLET) BY MOUTH 2 TIMES DAILY.) 60 tablet 9 10/15/2015 at 2100  . busPIRone (BUSPAR) 15 MG tablet Take 15 mg by mouth daily.   10/15/2015 at Unknown time  . carvedilol (COREG) 25 MG tablet Take 0.5 tablets (12.5 mg total) by mouth 2 (two) times daily with a meal. 60 tablet 6 10/15/2015 at Unknown time  . Cholecalciferol (VITAMIN D3) 2000 UNITS TABS Take 4,000 Units by mouth daily.    10/15/2015 at Unknown time  . CYMBALTA 60 MG capsule Take 60 mg by mouth daily.    10/15/2015 at Unknown time  . HYDROcodone-acetaminophen (NORCO) 10-325 MG per tablet Take 1-2 tablets by mouth every 6 (six) hours as  needed. For pain   10/15/2015 at Unknown time  . insulin glargine (LANTUS) 100 UNIT/ML injection Inject 64 Units into the skin at bedtime.    10/15/2015 at Unknown time  . isosorbide mononitrate (IMDUR) 60 MG 24 hr tablet Take 1 tablet (60 mg total) by mouth daily. 30 tablet 11 10/15/2015 at Unknown time  . LINZESS 145 MCG CAPS capsule Take 145 mcg by mouth daily.   10/15/2015 at Unknown time  . lisinopril (PRINIVIL,ZESTRIL) 5 MG tablet Take 5 mg by mouth daily.   10/15/2015 at Unknown time  . mirtazapine (REMERON) 15 MG tablet Take 15 mg by mouth at bedtime.    10/15/2015 at Unknown time  . nitroGLYCERIN (NITROSTAT) 0.4 MG SL tablet Place 1 tablet (0.4 mg total) under the tongue every 5 (five) minutes as needed for chest pain (up to 3 doses). 100 tablet 4 Taking  . NOVOLOG FLEXPEN 100 UNIT/ML FlexPen Inject 40-50 Units into the skin 2 (two) times daily. Use 50 units in the morning and use 40 units in the evening   10/15/2015 at Unknown time  . QUEtiapine (SEROQUEL) 400 MG tablet Take 400 mg by mouth at bedtime.   10/15/2015 at Unknown time    Insulin Requirements in the past 24 hours:  Levemir  35 units + 17 units of SSI  Assessment: S/p CABG now with gastric dilatation had Cortrak, tube feeds now off, unable to tolerate enteral feeds.  Surgeries/Procedures: CABG + Carotid Enderartectomy 4/28 GI: Was on TF, now discontinued on ppi Endo: hx DM. CBGs controlled but trending up (100-150s) on Levermir BID, SSI, Victoza.  Lytes: wnl exc Mg 2.6, CoCa 9.5 Renal: SCr 1.21, normalized CrCl ~48m/min. UOP good at 1.357mkg/hr. 1/2NS at 1068mr Pulm: Trach on 60% FiO2 Cards: CAD s/p CABG this admit, HTN, VSS   Zetia, lasix, hydralazine, lopressor Hepatobil: LFTs now slightly elevated, Tbili 1.6, TG 130 Neuro: hx depression, anxiety on Cymbalta, Buspar, Seroquel ID: Zosyn for serratia pneumonia Best Practices: Lovenox TPN Access: PICC 5/3 TPN start date: 5/10  Nutritional Goals: per RD recs on 5/9 Kcal:  [id number]: > 180 g  Current  Nutrition:  NPO Vital HP off Clinimix E5/15 at 81m/hr  Plan:  Increase Clinimix E 5/15 to 740mhr Hold 20% lipid emulsion for first 7 days for ICU patients per ASPEN guidelines (Start date 5/17) This provides 84 g of protein and 1193 kCals per day meeting 44% of protein and 70% of kCal needs Continue MVI and TE in TPN Continue resistant SSI and adjust as needed Continue Levemir 35 units BID Monitor TPN labs F/U ability to restart TFs  NaElenor QuinonesPharmD, BCMedina Hospitallinical Pharmacist Pager [phone]/04/2016 9:09 AM

## 2015-11-02 NOTE — Progress Notes (Signed)
11/02/2015- Respiratory care note-  Pt has a #6 cuffed shiley in place which was placed 5/5.  Pt now weaned to trach collar with fio2 at 60% at time of eval this afternoon.  Trach supplies at bedside.

## 2015-11-02 NOTE — Progress Notes (Signed)
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 Combined right carotid endarterectomy for bilateral 90% carotid stenosis Postoperative ventilator dependence for preop COPD Postop gram-negative pneumonia Serratia on IV Zosyn Postop gastric dilatation on Reglan, tube feeds on hold, now on TNA per pharm LTAC evaluation  inprocess for long-term ventilator-trach wean Patient is neuro intact but has not yet been out of bed to chair  KUB today slight improvement but with sig ileus Was on trach collar thru the night Labs ok Objective: Vital signs in last 24 hours: Temp:  [98.1 F (36.7 C)-99.4 F (37.4 C)] 99.3 F (37.4 C) (05/11 0821) Pulse Rate:  [83-103] 103 (05/11 1030) Cardiac Rhythm:  [-] Normal sinus rhythm;Bundle branch block (05/11 1000) Resp:  [18-32] 32 (05/11 1030) BP: (114-170)/(57-89) 114/57 mmHg (05/11 1030) SpO2:  [92 %-99 %] 94 % (05/11 1030) FiO2 (%):  [60 %] 60 % (05/11 1000) Weight:  [279 lb 15.8 oz (127 kg)] 279 lb 15.8 oz (127 kg) (05/11 0600)  Hemodynamic parameters for last 24 hours:  nsr  Intake/Output from previous day: 05/10 0701 - 05/11 0700 In: 1378.3 [I.V.:150; NG/GT:60; IV Piggyback:625; TPN:543.3] Out: 3970 [Urine:3970] Intake/Output this shift: Total I/O In: 130 [I.V.:20; NG/GT:30; TPN:80] Out: 700 [Urine:700]       Exam    General- alert and comfortable   Lungs- clear without rales, wheezes   Cor- regular rate and rhythm, no murmur , gallop   Abdomen- soft, non-tender   Extremities - warm, non-tender, minimal edema   Neuro- oriented, appropriate, no focal weakness   Lab Results:  Recent Labs  11/01/15 0230 11/02/15 0302  WBC 6.3 3.7*  HGB 9.3*  14.6  HCT 30.3* 46.3  PLT 216 181   BMET:  Recent Labs  11/01/15 0230 11/02/15 0302  NA 141 139  K 4.2 4.3  CL 105 102  CO2 25 27  GLUCOSE 115* 207*  BUN 22* 23*  CREATININE 1.11 1.21  CALCIUM 8.6* 8.6*    PT/INR: No results for input(s): LABPROT, INR in the last 72 hours. ABG    Component Value Date/Time   PHART 7.305* 10/28/2015 0527   HCO3 34.6* 10/28/2015 0527   TCO2 27 10/29/2015 1619   ACIDBASEDEF 1.0 10/20/2015 1824   O2SAT 67.0 10/31/2015 0345   CBG (last 3)   Recent Labs  11/01/15 2347 11/02/15 0402 11/02/15 0824  GLUCAP 154* 140* 163*    Assessment/Plan: S/P Procedure(s) (LRB): REDO CORONARY ARTERY BYPASS GRAFTING (CABG), TIMES THREE, USING LEFT GREATER SAPHENOUS VEIN HARVESTED ENDOSCOPICALLY AND CRYO VEIN, WITH CORONARY ENDARTERECTOMY (N/A) TRANSESOPHAGEAL ECHOCARDIOGRAM (TEE) (N/A) ENDARTERECTOMY CAROTID (Right) Mobilize Diuresis keep on trach collar and eval for PM valve tomorrow   LOS: 17 days    Kathlee Nationseter Van Trigt III 11/02/2015

## 2015-11-02 NOTE — Progress Notes (Signed)
Speech Language Pathology Treatment: Tyler Le Speaking valve  Patient Details Name: Tyler Le MRN: [id number] DOB: Oct 26, 1945 Today's Date: 11/02/2015 Time: [id number] SLP Time Calculation (min) (ACUTE ONLY): 36 min  Assessment / Plan / Recommendation Clinical Impression  Pt is more alert this afternoon as compared to initial evaluation, and wore PMV for 30 minutes, coughing it off the trach hub x1. Small amount of air noted upon valve removal upon completion of trial. Initially he needed Mod cues for increased volume to facilitate intelligibility of speech, although this improved as conversation continued. Some cognitive difficulties noted in conversation, including impaired memory of daily events and poor safety awareness, thinking that he was ready to go home whenever he wanted. Recommend to wear PMV during the day when he has supervision. Will continue to follow and educate about placement/removal of valve to increase independence.   HPI HPI: pt presents with Cardiac Arrest and Redo of CABG x3. pt intubated 4/24 and with Trach on 5/5. pt with hx of CABG, Anxiety, HTN, DM, CAD, Renal Insufficiency, and Back Surgeries.       SLP Plan  Continue with current plan of care     Recommendations         Patient may use Passy-Le Speech Valve: Intermittently with supervision PMSV Supervision: Full      Follow up Recommendations: Skilled Nursing facility;LTACH Plan: Continue with current plan of care     GO               Tyler Le, M.A. CCC-SLP [phone]  Tyler Hamaiewonsky, Tyler Le 11/02/2015, 4:20 PM

## 2015-11-03 ENCOUNTER — Inpatient Hospital Stay (HOSPITAL_COMMUNITY): Payer: Medicare Other

## 2015-11-03 LAB — GLUCOSE, CAPILLARY
Glucose-Capillary: 176 mg/dL — ABNORMAL HIGH (ref 65–99)
Glucose-Capillary: 182 mg/dL — ABNORMAL HIGH (ref 65–99)
Glucose-Capillary: 189 mg/dL — ABNORMAL HIGH (ref 65–99)
Glucose-Capillary: 213 mg/dL — ABNORMAL HIGH (ref 65–99)
Glucose-Capillary: 224 mg/dL — ABNORMAL HIGH (ref 65–99)
Glucose-Capillary: 237 mg/dL — ABNORMAL HIGH (ref 65–99)

## 2015-11-03 LAB — CBC
HCT: 31.2 % — ABNORMAL LOW (ref 39.0–52.0)
Hemoglobin: 9.7 g/dL — ABNORMAL LOW (ref 13.0–17.0)
MCH: 28.6 pg (ref 26.0–34.0)
MCHC: 31.1 g/dL (ref 30.0–36.0)
MCV: 92 fL (ref 78.0–100.0)
Platelets: 302 10*3/uL (ref 150–400)
RBC: 3.39 MIL/uL — ABNORMAL LOW (ref 4.22–5.81)
RDW: 14.7 % (ref 11.5–15.5)
WBC: 8.7 10*3/uL (ref 4.0–10.5)

## 2015-11-03 LAB — POCT I-STAT, CHEM 8
BUN: 32 mg/dL — ABNORMAL HIGH (ref 6–20)
Calcium, Ion: 1.16 mmol/L (ref 1.13–1.30)
Chloride: 99 mmol/L — ABNORMAL LOW (ref 101–111)
Creatinine, Ser: 1 mg/dL (ref 0.61–1.24)
Glucose, Bld: 184 mg/dL — ABNORMAL HIGH (ref 65–99)
HCT: 32 % — ABNORMAL LOW (ref 39.0–52.0)
Hemoglobin: 10.9 g/dL — ABNORMAL LOW (ref 13.0–17.0)
Potassium: 4 mmol/L (ref 3.5–5.1)
Sodium: 140 mmol/L (ref 135–145)
TCO2: 31 mmol/L (ref 0–100)

## 2015-11-03 LAB — BASIC METABOLIC PANEL
Anion gap: 10 (ref 5–15)
BUN: 25 mg/dL — ABNORMAL HIGH (ref 6–20)
CO2: 26 mmol/L (ref 22–32)
Calcium: 8.6 mg/dL — ABNORMAL LOW (ref 8.9–10.3)
Chloride: 102 mmol/L (ref 101–111)
Creatinine, Ser: 1.01 mg/dL (ref 0.61–1.24)
GFR calc Af Amer: >60 mL/min (ref >60)
GFR calc non Af Amer: >60 mL/min (ref >60)
Glucose, Bld: 194 mg/dL — ABNORMAL HIGH (ref 65–99)
Potassium: 4.5 mmol/L (ref 3.5–5.1)
Sodium: 138 mmol/L (ref 135–145)

## 2015-11-03 MED ORDER — METOPROLOL TARTRATE 5 MG/5ML IV SOLN
5.0000 mg | Freq: Once | INTRAVENOUS | Status: AC
  Administered 2015-11-03: 5 mg via INTRAVENOUS
  Filled 2015-11-03: qty 5

## 2015-11-03 MED ORDER — M.V.I. ADULT IV INJ
INJECTION | INTRAVENOUS | Status: AC
  Administered 2015-11-03: 17:00:00 via INTRAVENOUS
  Filled 2015-11-03: qty 1680

## 2015-11-03 MED ORDER — FENTANYL CITRATE (PF) 100 MCG/2ML IJ SOLN
25.0000 ug | INTRAMUSCULAR | Status: DC | PRN
  Administered 2015-11-04 – 2015-11-07 (×5): 25 ug via INTRAVENOUS
  Filled 2015-11-03 (×5): qty 2

## 2015-11-03 NOTE — Evaluation (Signed)
Occupational Therapy Evaluation Patient Details Name: Tyler DeutscherDwight E Brake MRN: [id number] DOB: 05-04-1946 Today's Date: 11/03/2015    History of Present Illness pt presents with Cardiac Arrest and Redo of CABG x3.  pt intubated 4/24 and with Trach on 5/5.  pt with hx of CABG, Anxiety, HTN, DM, CAD, Renal Insufficiency, and Back Surgeries.     Clinical Impression   Pt was assisted for LB bathing and dressing and IADL prior to admission. He presents with generalized weakness, decreased activity tolerance and impaired balance interfering with ability to perform at his baseline. Difficult to assess cognition due to tracheostomy, but able to follow commands and answer questions about his PLOF and home set up appropriately. Pt with anxiety about transfer to chair, but did remarkably well requiring 2 person minimal assistance. Pt and wife are eager for pt to return home. Recommending inpatient rehab consult with pt's medical complexity and given how well he did today. Will follow acutely.    Follow Up Recommendations  CIR    Equipment Recommendations  None recommended by OT    Recommendations for Other Services Rehab consult     Precautions / Restrictions Precautions Precautions: Fall;Sternal Precaution Comments: Trach collar, Watch O2 sats Restrictions Weight Bearing Restrictions: Yes Other Position/Activity Restrictions: sternal precautions      Mobility Bed Mobility Overal bed mobility: Needs Assistance;+2 for physical assistance Bed Mobility: Supine to Sit     Supine to sit: +2 for physical assistance;Mod assist     General bed mobility comments: assist to guide LEs off EOB, assist for elevating trunk and gaining sitting balance  Transfers Overall transfer level: Needs assistance Equipment used: 2 person hand held assist Transfers: Sit to/from Stand;Stand Pivot Transfers Sit to Stand: +2 physical assistance;Min assist Stand pivot transfers: +2 physical assistance;Min assist        General transfer comment: assist to rise and steady    Balance Overall balance assessment: Needs assistance Sitting-balance support: Feet supported Sitting balance-[name] Scale: Fair Sitting balance - Comments: using UB minimally for balance     Standing balance-[name] Scale: Poor                              ADL Overall ADL's : Needs assistance/impaired Eating/Feeding: NPO   Grooming: Wash/dry hands;Wash/dry face;Set up;Sitting   Upper Body Bathing: Minimal assitance;Sitting   Lower Body Bathing: Total assistance;Sit to/from stand   Upper Body Dressing : Minimal assistance;Sitting   Lower Body Dressing: Total assistance;Sit to/from stand   Toilet Transfer: +2 for physical assistance;Min guard;Stand-pivot Toilet Transfer Details (indicate cue type and reason): simulated to recliner Toileting- Clothing Manipulation and Hygiene: Total assistance;Sit to/from stand               Vision     Perception     Praxis      Pertinent Vitals/Pain Pain Assessment: No/denies pain     Hand Dominance Right   Extremity/Trunk Assessment Upper Extremity Assessment Upper Extremity Assessment: Generalized weakness (unable to formally assess due to sternal precautions)   Lower Extremity Assessment Lower Extremity Assessment: Defer to PT evaluation   Cervical / Trunk Assessment Cervical / Trunk Assessment: Normal (hx of back surgeries)   Communication Communication Communication: Tracheostomy   Cognition Arousal/Alertness: Awake/alert Behavior During Therapy: WFL for tasks assessed/performed Overall Cognitive Status: Difficult to assess                     General Comments  Exercises       Shoulder Instructions      Home Living Family/patient expects to be discharged to:: Unsure Living Arrangements: Spouse/significant other                                      Prior Functioning/Environment Level of Independence:  Needs assistance    ADL's / Homemaking Assistance Needed: assisted with LB ADL and all IADL        OT Diagnosis: Generalized weakness   OT Problem List: Decreased strength;Decreased activity tolerance;Impaired balance (sitting and/or standing);Decreased knowledge of use of DME or AE;Cardiopulmonary status limiting activity;Decreased knowledge of precautions   OT Treatment/Interventions: Self-care/ADL training;DME and/or AE instruction;Therapeutic activities;Patient/family education;Balance training;Energy conservation    OT Goals(Current goals can be found in the care plan section) Acute Rehab OT Goals Patient Stated Goal: Per wife to recover and come home. OT Goal Formulation: With patient/family Time For Goal Achievement: 11/17/15 Potential to Achieve Goals: Good ADL Goals Pt Will Perform Grooming: with min assist;standing Pt Will Perform Upper Body Bathing: with min assist;sitting Pt Will Perform Lower Body Bathing: with min assist;with adaptive equipment;sit to/from stand Pt Will Perform Upper Body Dressing: with set-up;sitting Pt Will Perform Lower Body Dressing: with min assist;sit to/from stand;with adaptive equipment Pt Will Transfer to Toilet: with min assist;ambulating;bedside commode (over toilet) Pt Will Perform Toileting - Clothing Manipulation and hygiene: sit to/from stand;with min assist Additional ADL Goal #1: Pt will generalize energy conservation strategies in ADL with minimal cues. Additional ADL Goal #2: Pt will state sternal precautions independently.  OT Frequency: Min 2X/week   Barriers to D/C:            Co-evaluation              End of Session Equipment Utilized During Treatment: Oxygen Nurse Communication: Mobility status  Activity Tolerance: Patient tolerated treatment well Patient left: in chair;with family/visitor present;with nursing/sitter in room;with call bell/phone within reach   Time: 1021-1037 OT Time Calculation (min): 16  min Charges:  OT General Charges $OT Visit: 1 Procedure OT Evaluation $OT Eval High Complexity: 1 Procedure G-Codes:    Evern Bio 11/03/2015, 11:02 AM  [phone]

## 2015-11-03 NOTE — Progress Notes (Signed)
eLink Physician-Brief Progress Note Patient Name: Tyler Le DOB: 1945/06/30 MRN: [id number]   Date of Service  11/03/2015  HPI/Events of Note  Tachycardia with HR In the 130s to 140s.  BP of 128/76.  Patient has been restless.  Received fentanyl 50 mcg at 1245AM.  Pulled FT so unable to give scheduled seroquel.  QTc of greater than 550.    eICU Interventions  Plan: Redose fentanyl One time dose of lopressor 5 mg for tachycardia     Intervention Category Intermediate Interventions: Arrhythmia - evaluation and management  Tyler Le 11/03/2015, 1:59 AM

## 2015-11-03 NOTE — Care Management Note (Signed)
Case Management Note  Patient Details  Name: Tyler Le MRN: [id number] Date of Birth: July 23, 1945  Subjective/Objective:  Pt admitted for Unstable Angina. Plan for CABG on 10-20-15. Pt is from home with wife. Pt has no DME needs at this time.                 Action/Plan: 11/03/2015 Pt is from home with wife.  Pt remains ventilated on sedation and pressors.  CM will continue to monitor for disposition needs  CM will continue to monitor for disposition needs.   Expected Discharge Date:                  Expected Discharge Plan:  Long Term Acute Care (LTAC)  In-House Referral:  NA, Clinical Social Work  Discharge planning Services  CM Consult  Post Acute Care Choice:    Choice offered to:     DME Arranged:    DME Agency:     HH Arranged:    [place] Agency:     Status of Service:  In process, will continue to follow  Medicare Important Message Given:  Yes Date Medicare IM Given:    Medicare IM give by:    Date Additional Medicare IM Given:    Additional Medicare Important Message give by:     If discussed at [place] of Stay Meetings, dates discussed:    Additional Comments:   11/03/2015  [employer] denied LTACH coverage.  Attending agreed to perform peer to peer with [facility].  Pt is on trach collar 60-80%.  TPN, IV antibiotics.  CM will continue to follow for disposition needs   10/31/15 CM met with wife and son and explained the recommended discharge plan of LTACH.  Both wife and son are in agreement with [facility] as discharge disposition.  [facility] liaison met with family and [facility] has been chosen- insurance auth is pending  10/30/15 CM received LTACH referral.  CM gave referral to both [facility] and [facility].  CM contact pts wife [name] via phone to inform of discharge recommendation and provided geographical information regarding the two options in the local area.  Pts wife will contact son and follow back up with CM.   10/27/15 Pt trached today 10/27/15.  CM had very detailed  discussion with wife regarding discharge options should pt not be able to wean off vent/trach prior to discharge including;  Home with Home Health (requirements explained in detail), LTAC, and SNF.  CM explained that discharge planning begins early so that barriers can be eliminated prior to possible delays in progression of care.  CM will continue to follow pt and family and assist with discharge needs.  CSW consulted on tentative consult.   Maryclare Labrador, RN 11/03/2015, 9:17 AM

## 2015-11-03 NOTE — Progress Notes (Signed)
Name: Tyler Le MRN: [id number] DOB: 1945-11-25    ADMISSION DATE:  10/16/2015 CONSULTATION DATE:  10/18/15  REFERRING MD : Donata Clay, MD  CHIEF COMPLAINT:  Hypercapnia  SUBJECTIVE:    Up in chair talking with PMV.   VITAL SIGNS: Temp:  [98.6 F (37 C)-99.2 F (37.3 C)] 99.2 F (37.3 C) (05/12 1135) Pulse Rate:  [85-139] 104 (05/12 1135) Resp:  [0-33] 20 (05/12 1135) BP: (105-166)/(60-91) 164/81 mmHg (05/12 1000) SpO2:  [92 %-99 %] 98 % (05/12 1135) FiO2 (%):  [60 %-80 %] 60 % (05/12 1135)  Intake/Output Summary (Last 24 hours) at 11/03/15 1233 Last data filed at 11/03/15 0600  Gross per 24 hour  Intake   1311 ml  Output   1445 ml  Net   -134 ml   PHYSICAL EXAMINATION: General:  Obese male; alert and interactive, NAD on TC. HENT: NCAT; trach unremarkable  PULM: resps even non labored on ATC and PMV, CTA bilaterally. CV: RRR, no clear murmur GI: BS+, soft, distended MSK: normal bulk and tone, moves all extremities Neuro: sleepy but easily wakes, follows commands  Recent Labs Lab 11/01/15 0230 11/02/15 0302 11/03/15 0416  NA 141 139 138  K 4.2 4.3 4.5  CL 105 102 102  CO2 BUN 22* 23* 25*  CREATININE 1.11 1.21 1.01  GLUCOSE 115* 207* 194*   Recent Labs Lab 11/01/15 0230 11/02/15 0302 11/03/15 0416  HGB 9.3* 14.6 9.7*  HCT 30.3* 46.3 31.2*  WBC 6.3 3.7* 8.7  PLT 216 181 302   Dg Chest Port 1 View  11/03/2015  CLINICAL DATA:  Pulmonary vascular congestion EXAM: PORTABLE CHEST 1 VIEW COMPARISON:  Nov 02, 2015 FINDINGS: Tracheostomy catheter tip is 4.0 cm above carina. Central catheter tip is in the superior vena cava. No pneumothorax. Airspace consolidation in the bases remains stable. No new opacity. Heart is enlarged. The pulmonary vascularity is normal. IMPRESSION: Stable cardiac prominence. Airspace consolidation is noted in the lower lobe regions, more severe on the left than on the right, likely a combination of atelectasis and  pneumonia. Tube and catheter positions as described without pneumothorax. Electronically Signed   By: Bretta Bang III M.D.   On: 11/03/2015 08:13   Dg Chest Port 1 View  11/02/2015  CLINICAL DATA:  Known ileus EXAM: PORTABLE CHEST 1 VIEW COMPARISON:  11/01/2015 FINDINGS: Tracheostomy tube, feeding catheter and right-sided PICC line are again identified and stable. Cardiac shadow remains enlarged. Postsurgical changes are again seen. Mild central vascular congestion is noted. Stable opacities are noted in the left base. No new focal abnormality is seen. IMPRESSION: Stable changes in the left base. Mild central vascular congestion is now seen. Electronically Signed   By: Alcide Clever M.D.   On: 11/02/2015 07:59   Dg Abd Portable 1v  11/03/2015  CLINICAL DATA:  Encounter for feeding tube placement EXAM: PORTABLE ABDOMEN - 1 VIEW COMPARISON:  Portable exam 0902 hours compared to 11/03/2015 at 0650 hours FINDINGS: New feeding tube with tip projecting over duodenal bulb. Question pacing wires. Nonobstructive bowel gas pattern. Bones unremarkable. IMPRESSION: Tip of feeding tube projects over duodenal bulb. Electronically Signed   By: Ulyses Southward M.D.   On: 11/03/2015 09:15   Dg Abd Portable 1v  11/03/2015  CLINICAL DATA:  Bowel ileus EXAM: PORTABLE ABDOMEN - 1 VIEW COMPARISON:  Nov 02, 2015 FINDINGS: There is mild colonic dilatation. There is no appreciable small bowel dilatation. No air-fluid levels are noted. No  free air is seen on this supine examination. Previous nasogastric tube is not appreciable currently. IMPRESSION: Evidence a degree of colonic ileus. No demonstrable small bowel obstruction or free air. Electronically Signed   By: Bretta BangWilliam  Woodruff III M.D.   On: 11/03/2015 08:14   Dg Abd Portable 1v  11/02/2015  CLINICAL DATA:  Ileus EXAM: PORTABLE ABDOMEN - 1 VIEW COMPARISON:  11/01/2015 FINDINGS: Feeding catheter is noted in place in the distal duodenum/ proximal jejunum. Scattered large and  small bowel gas is noted. The degree of small bowel dilatation is increased slightly in the interval from the prior exam. Correlation with clinical exam is recommended. No free air is seen. Degenerative change of the lumbar spine is noted. IMPRESSION: Slight increase in the degree of small bowel dilatation. Electronically Signed   By: Alcide CleverMark  Lukens M.D.   On: 11/02/2015 08:00  CXR w/ improved aeration   STUDIES:  PFT 4/25 >> restrictive lung dz, low DLCO diffusion LE Doppler 5/2 >>  negative  CULTURES: Sputum 5/5 >>few yeast consistent with candida species Sputum 5/6>>>serratia>>> RES cefazolin, otherwise sens  BC 5/6>>> Urine 5/6>>>GNR>>>Serratia>>>RES cefazolin and nitrofurantoin   ANTIBIOTICS: Cefuroxime 4/28 >> 4/30 vanc 5/6>>off  Zosyn 5/6>>>5/11 Ceftriaxone 5/11>>>  SIGNIFICANT EVENTS: 4/28 CABG and CEA 5/01 Bradycardia / hypotension with any weaning of sedation 5/02 Episodes of desaturation, increased O2 needs 5/03 Weaned off dopamine, neo, sedation reduced 5/5 trach.  5/6 fevers, still on fio2 60%/peep 8. ABX started; lasix cut back to daily  5/7 Creatinine up. Lasix stopped. Weaning on PS 10/peep 8, fent gtt stopped and changed to PRN  LINES/TUBES: 4/28  midine R arm >>5/1  4/28  L subclavian introducer >  4/28  R neck drain >> 4/30 4/28  mediastinal chest tubes >> 4/30 5/01  PICC >> Trach 5/5 (JY)>>>  I reviewed CXR myself, improved aeration.  ASSESSMENT / PLAN:  DISCUSSION: [age] y/o male with obesity hypoventilation syndrome who underwent a redo CABG and CEA on 4/28. Remains on vent post-operatively - anxiety/encephalopathy, bradycardia/hypotension as barriers to extubation but these are improving. S/p trach 5/5. ABX started on 5/6. Tolerating ATC trials.   ASSESSMENT / PLAN:  PULMONARY A: Vent dependence  Chronic respiratory failure with hypercapnea > improving Obesity hypoventilation syndrome Tracheostomy status. S/p trach 5/5 Serratia PNA P:   TC as  tolerated. D/C vent at night. No capping of trach at night given OSA history. D/C sedation. Mobilize as able - PT involved. No plans for decannulation at this point, change to a cuffless 4 on Monday. Intermittent f/u CXR. Speech therapy involved for PMV Swallow evaluation in AM. Abx as above for serratia   NEUROLOGIC A:   Acute encephalopathy > improving slowly Baseline mental illness, poorly understood, multiple medications at home Chronic pain  MS improved as of 5/7. Wonder if fever +/- some level of infection was contributing factor P:   RASS goal: now 0 to -1 PRN fentanyl  Decreased seroquel to 50 BID on 5/12 and follow QTc interval closely, further titrate as able Continue cymbalta, buspar Mobilize  CARDIOVASCULAR A:  S/p CABG and (R) CEA P:  Per vascular and TCTS Monitor hemodynamics Tele  RENAL A:   AKI - initially improving then Scr rising 5/7.  Improved 5/8 off diuresis.  P:   Monitor BMET and UOP Replace electrolytes as needed   Continue lasix 20 mg IV BID, when able to take PO change to 40 mg PO lasix daily.  GASTROINTESTINAL A:   Morbid Obesity  P:   Cortrak placed 5/1, continue TF PPI. Reglan scheduled. TPN as ordered by CVTS. Swallow eval per Speech   HEMATOLOGIC A:   Anemia, no bleeding Thrombocytopenia - resolved  P:  Transfusion per usual ICU guidelines. Lovenox. CBC in AM.  INFECTIOUS A:   Recurrent fever 5/6, normal WBC;  No infiltrate on CXR 5/7 Serratia UTI  Serratia PNA P:   Narrow abx to ceftriaxone 5/11, will continue for now.  ENDOCRINE A:   Hyperglycemia P:   Continue home liraglutide, SSI, levemir  FAMILY  - Updates: pt and wife updated at bedside 5/12.  PCCM will continue to follow for trach management.  Will see again on Monday.  Discussed with RT and bedside RN.  Alyson Reedy, M.D. [facility] Pulmonary/Critical Care Medicine. Pager: [phone]. After hours pager: [phone].

## 2015-11-03 NOTE — Progress Notes (Signed)
Speech Language Pathology Treatment: Hillary BowPassy Muir Speaking valve  Patient Details Name: Tyler DeutscherDwight E Noyola MRN: [id number] DOB: 09-Sep-1945 Today's Date: 11/03/2015 Time: 1100-1145 SLP Time Calculation (min) (ACUTE ONLY): 45 min  Assessment / Plan / Recommendation Clinical Impression  Patient seen to address PMV and assess toleration and ability to phonate. Patient's spouse is present in room as well and is able to confirm patient's statements (he has had some mild confusion). Patient is sitting up in recliner and per RN and patient, he got out of bed with minimal assistance and today he feels much better. Patient tolerated placement of PMV valve for 40 minutes, and intermittently would cough it off, but SLP replaced it immediately. Patient able to phonate a little with PMV off, but significant improvement in phonation and ability to speak at conversational level with PMV in place. Patient very excited about this and his wife called some friends/family members and he was able to speak with them on phone briefly. Patient's voice is mildly hoarse, but vocal intensity is good and patient is able to maintain adequate volume at sentence and conversational level with breaks to take in a breath. No changes in HR, RR or Sp02 saturation during this session.   HPI HPI: pt presents with Cardiac Arrest and Redo of CABG x3. pt intubated 4/24 and with Trach on 5/5. pt with hx of CABG, Anxiety, HTN, DM, CAD, Renal Insufficiency, and Back Surgeries.       SLP Plan  Continue with current plan of care     Recommendations         Patient may use Passy-Muir Speech Valve: During all waking hours (remove during sleep) (with RN assist for placement of PMV) PMSV Supervision: Full MD: Please consider changing trach tube to : Smaller size;Cuffless;Other (comment) (agree with MD's plan for downsize to #4 cuffless on 5/15)      Follow up Recommendations: Skilled Nursing facility;LTACH Plan: Continue with current plan of  care     GO                Angela NevinJohn T. Jaz Laningham, MA, CCC-SLP 11/03/2015 4:10 PM

## 2015-11-03 NOTE — Progress Notes (Addendum)
Nutrition Follow-up  DOCUMENTATION CODES:   Obesity unspecified  INTERVENTION:    Recommend changing TF regimen to Vital AF 1.2 formula at goal rate of 80 ml/hr   Above TF regimen to provide 2304 kcals, 144 gm protein, 1557 ml of free water  NUTRITION DIAGNOSIS:   Inadequate oral intake related to inability to eat as evidenced by NPO status, ongoing  GOAL:   Provide needs based on ASPEN/SCCM guidelines, met  MONITOR:   TF tolerance, Vent status, Labs, Weight trends, I & O's  ASSESSMENT:   Tyler Le is a 69 y.o. male, who presents for evaluation of bilateral internal carotid artery stenosis. He is scheduled for a redo CABG on Friday with Dr. Van Trigt for severe recurrent three vessel CAD. He previously had a CABG in 2001. He underwent cardiac cath on 10/16/15 due to unstable angina and was admitted afterwards.   Patient s/p redo CABG 4/28. S/p bedside trach 5/5 >> off vent support >> on trach collar.  Vital High Protein formula currently infusing at 30 ml/hr via CORTRAK small bore feeding tube. Providing 720 kcals, 63 gm protein, 602 ml of free water. Previous tube pulled out per patient 5/11 PM. New tube placed this AM per [name], RN. Tube tip now projects over duodenal bulb.  Patient is receiving TPN with Clinimix E 5/15 @ 70 ml/hr.  No ILE at this time.  Provides 1193 kcal and 84 grams protein per day.  Meets 51% minimum estimated energy needs and 47% minimum estimated protein needs.  Current TF regimen + TPN prescription providing 1913 kcals, 147 gm protein which is meeting 83% of estimated energy needs and 100% of estimated protein needs.  Diet Order:  Diet NPO time specified .TPN (CLINIMIX-E) Adult .TPN (CLINIMIX-E) Adult  Skin:  Reviewed, no issues  Last BM:  5/10  Height:   Ht Readings from Last 1 Encounters:  10/28/15 6' 4" (1.93 m)    Weight:   Wt Readings from Last 1 Encounters:  11/02/15 279 lb 15.8 oz (127 kg)    Ideal Body Weight:   91.8 kg  BMI:  Body mass index is 34.09 kg/(m^2).  Estimated Nutritional Needs:   Kcal:  2300-2500  Protein:  140-150 gm  Fluid:  per MD  EDUCATION NEEDS:   No education needs identified at this time  Katie , RD, LDN Pager #: [phone] After-Hours Pager #: [phone]  

## 2015-11-03 NOTE — Progress Notes (Signed)
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 This was replaced and confirmed to be post pyloric by KUB Ileus pattern is improved on x-ray and he is passing gas-tube feeds will be resumed at a slow rate We'll continue TNA for another 36 hours to make sure that he can tolerate further advancement of tube feeds to goal.  The patient has made progress and has been off the ventilator 24 hours. He will be assessed for a p.m. valve. Later he will be assessed for swallowing. Currently his trach cuff is deflated.  Postop pneumonia is responding to IV antibiotics, complete 10 day course for gram-negative pneumonia Objective: Vital signs in last 24 hours: Temp:  [98.3 F (36.8 C)-98.8 F (37.1 C)] 98.8 F (37.1 C) (05/12 0741) Pulse Rate:  [85-139] 94 (05/12 0741) Cardiac Rhythm:  [-] Normal sinus rhythm;Bundle branch block (05/12 0745) Resp:  [0-33] 26 (05/12 0741) BP: (105-166)/(57-91) 146/85 mmHg (05/12 0600) SpO2:  [85 %-99 %] 98 % (05/12 0741) FiO2 (%):  [60 %-80 %] 60 % (05/12 0741)  Hemodynamic parameters for last 24 hours:    Intake/Output from previous day: 05/11 0701 - 05/12 0700 In: 1691 [I.V.:280; NG/GT:60; IV Piggyback:50; TPN:1301] Out: 2465  [Urine:2465] Intake/Output this shift:        Physical Exam        Exam    General- alert and comfortable   Lungs- clear without rales, wheezes   Cor- regular rate and rhythm, no murmur , gallop   Abdomen- soft, non-tender   Extremities - warm, non-tender, minimal edema   Neuro- oriented, appropriate, no focal weakness  Lab Results:  Recent Labs  11/02/15 0302 11/03/15 0416  WBC 3.7* 8.7  HGB 14.6 9.7*  HCT 46.3 31.2*  PLT 181 302   BMET:  Recent Labs  11/02/15 0302 11/03/15 0416  NA 139 138  K 4.3 4.5  CL 102 102  CO2 27 26  GLUCOSE 207* 194*  BUN 23* 25*  CREATININE 1.21 1.01  CALCIUM 8.6* 8.6*    PT/INR: No results for input(s): LABPROT, INR in the last 72 hours. ABG    Component Value Date/Time   PHART 7.305* 10/28/2015 0527   HCO3 34.6* 10/28/2015 0527   TCO2 27 10/29/2015 1619   ACIDBASEDEF 1.0 10/20/2015 1824   O2SAT 67.0 10/31/2015 0345   CBG (last 3)   Recent Labs  11/02/15 1925 11/03/15 0014 11/03/15 0349  GLUCAP 203* 189* 176*    Assessment/Plan: S/P Procedure(s) (LRB): REDO CORONARY ARTERY BYPASS GRAFTING (CABG), TIMES THREE, USING LEFT GREATER SAPHENOUS VEIN HARVESTED ENDOSCOPICALLY AND CRYO VEIN, WITH CORONARY ENDARTERECTOMY (N/A) TRANSESOPHAGEAL ECHOCARDIOGRAM (TEE) (N/A) ENDARTERECTOMY CAROTID (Right) Resume tube feeds slowly Continue TPN another 36 hours Continue to mobilize out of bed to chair and hopefully to start ambulation soon Finish 10 days of IV antibiotic-Zosyn for gram-negative sputum culture   LOS: 18 days    Tyler AristaPeter  Zenaida Niece Trigt Le 11/03/2015

## 2015-11-03 NOTE — Progress Notes (Addendum)
Pt HR increased to 130s/140s. Pt does not complain of pain has normal work of breathing and respirations on trach collar, but patient is restless and asking for something for nerves. 50 mcg of Fentanyl given at 0045 with no change in HR but patient relaxed and slept for an hour. 12 lead shows wide QRS tachycardia with right bundle branch block. E-link MD notified and order for additional 50 mcg of Fentanyl and 5 mg Lopressor. Patient trying to get out of bed and developed a small skin tear on right hand in the process. Fentanyl and Lopressor given. Pt now calm and resting with no change in HR that is still sustained in 130s. Dr. Darrick Pennaeterding updated. Will continue to closely monitor.

## 2015-11-03 NOTE — Progress Notes (Signed)
Patient ID: [name], male   DOB: Oct 17, 1945, 70 y.o.   MRN: [id number] EVENING ROUNDS NOTE :     [address]       [place] [id number]             [phone]                 14 Days Post-Op Procedure(s) (LRB): REDO CORONARY ARTERY BYPASS GRAFTING (CABG), TIMES THREE, USING LEFT GREATER SAPHENOUS VEIN HARVESTED ENDOSCOPICALLY AND CRYO VEIN, WITH CORONARY ENDARTERECTOMY (N/A) TRANSESOPHAGEAL ECHOCARDIOGRAM (TEE) (N/A) ENDARTERECTOMY CAROTID (Right)  Total Length of Stay:  LOS: 18 days  BP 156/70 mmHg  Pulse 95  Temp(Src) 98.9 F (37.2 C) (Oral)  Resp 24  Ht 6\' 4"  (1.93 m)  Wt 279 lb 15.8 oz (127 kg)  BMI 34.09 kg/m2  SpO2 97%  .Intake/Output      05/11 0701 - 05/12 0700 05/12 0701 - 05/13 0700   I.V. (mL/kg) 280 (2.2)    NG/GT 60    IV Piggyback 50    TPN 1301    Total Intake(mL/kg) 1691 (13.3)    Urine (mL/kg/hr) 2465 (0.8)    Total Output 2465     Net -774            . .TPN (CLINIMIX-E) Adult 70 mL/hr at 11/03/15 1728  . sodium chloride 10 mL/hr at 10/24/15 1900  . sodium chloride 30 mL/hr (10/25/15 1700)  . sodium chloride 10 mL/hr at 10/31/15 0200  . feeding supplement (VITAL HIGH PROTEIN) 1,000 mL (10/31/15 0200)  . lactated ringers    . lactated ringers 10 mL/hr at 10/25/15 0700     Lab Results  Component Value Date   WBC 8.7 11/03/2015   HGB 9.7* 11/03/2015   HCT 31.2* 11/03/2015   PLT 302 11/03/2015   GLUCOSE 194* 11/03/2015   CHOL 130 05/09/2011   TRIG 130 11/01/2015   HDL 27* 05/09/2011   LDLCALC 31 05/09/2011   ALT 76* 11/02/2015   AST 52* 11/02/2015   NA 138 11/03/2015   K 4.5 11/03/2015   CL 102 11/03/2015   CREATININE 1.01 11/03/2015   BUN 25* 11/03/2015   CO2 26 11/03/2015   TSH 6.676* 10/17/2015   INR 1.35 10/20/2015   HGBA1C 7.1* 10/19/2015   Good day today, back on tube feeding, + bm and stood  Tyler OvensEdward B Dain Laseter MD  Beeper [phone] Office [phone] 11/03/2015 6:11 PM

## 2015-11-03 NOTE — Progress Notes (Addendum)
Inpatient Rehabilitation  Per OT request, patient was screened by Fae PippinMelissa Avalyn Molino for appropriateness for an Inpatient Acute Rehab consult.  At this time note that there is ongoing placement with LTACH.  If an IP Rehab consult is still needed please let us know.      Charlane FerrettiMelissa Tyger Wichman, M.A., CCC/SLP Admission Coordinator  [facility]  Cell [phone]

## 2015-11-03 NOTE — Progress Notes (Signed)
RT note: Ambu bag lavaged patient. Suctions moderate old blood secretions. Patient tolerated well. Vital signs stable at this time.

## 2015-11-03 NOTE — Progress Notes (Signed)
PARENTERAL NUTRITION CONSULT NOTE - Follow up  Pharmacy Consult for TPN Indication: Intolerance to enteral feeding  Allergies  Allergen Reactions  . Metformin And Related Nausea And Vomiting  . Pravachol Nausea And Vomiting  . Ramipril Nausea Only  . Wellbutrin [Bupropion Hcl] Nausea And Vomiting    Patient Measurements: Height: '6\' 4"'  (193 cm) Weight: 279 lb 15.8 oz (127 kg) IBW/kg (Calculated) : 86.8  Adjusted body weight: 82 kg  Vital Signs: Temp: 98.8 F (37.1 C) (05/12 0400) Temp Source: Oral (05/12 0400) BP: 146/85 mmHg (05/12 0600) Pulse Rate: 94 (05/12 0741) Intake/Output from previous day: 05/11 0701 - 05/12 0700 In: 1694 [I.V.:280; NG/GT:60; IV Piggyback:50; TPN:1301] Out: 2465 [Urine:2465] Intake/Output from this shift:    Labs:  Recent Labs  11/01/15 0230 11/02/15 0302 11/03/15 0416  WBC 6.3 3.7* 8.7  HGB 9.3* 14.6 9.7*  HCT 30.3* 46.3 31.2*  PLT 216 181 302     Recent Labs  11/01/15 0230 11/01/15 1030 11/02/15 0302 11/03/15 0416  NA 141  --  139 138  K 4.2  --  4.3 4.5  CL 105  --  102 102  CO2 25  --  27 26  GLUCOSE 115*  --  207* 194*  BUN 22*  --  23* 25*  CREATININE 1.11  --  1.21 1.01  CALCIUM 8.6*  --  8.6* 8.6*  MG 2.5*  --  2.6*  --   PHOS 3.4  --  3.9  --   PROT  --  6.5 6.7  --   ALBUMIN  --  2.3* 2.6*  --   AST  --  66* 52*  --   ALT  --  83* 76*  --   ALKPHOS  --  86 84  --   BILITOT  --  1.7* 1.6*  --   BILIDIR  --  0.8*  --   --   IBILI  --  0.9  --   --   PREALBUMIN  --  10.9*  --   --   TRIG  --  130  --   --    Estimated Creatinine Clearance: 97.6 mL/min (by C-G formula based on Cr of 1.01).    Recent Labs  11/02/15 1925 11/03/15 0014 11/03/15 0349  GLUCAP 203* 189* 176*    Medical History: Past Medical History  Diagnosis Date  . Essential hypertension   . Dyslipidemia   . Coronary artery disease     a. 2001: s/p CABG X4;  b. 08/2010 s/p PCI/BMS to VG->RI->OM;  c. 06/2014 Cath/PCI: LM nl, LAD 179m  LCX 769mRI 100, OM1 100, OM2 80, small, RCA 100ost, VG->RCA 100, VG->Diag 100, VG->RI->OM1 70-80 ISR (4.0x24 Promus DES), LIMA->LAD nl.  . Morbid obesity ([facility]  . Dysrhythmia   . History of blood transfusion 2001    "related to OHS"  . Anemia   . GERD (gastroesophageal reflux disease)   . Depression   . Anxiety   . Nephrolithiasis   . Complication of anesthesia     "they have a hard time waking him up"  . Type II diabetes mellitus ([facility]  . Arthritis     "back, neck" (07/11/2014)  . Chronic lower back pain   . Thrombocytopenia ([facility] [place]    a. Borderline low platelets by labs noted in 06/2014 but also noted on prior labs as well.  . Renal insufficiency     a. noted 06/2014 - baseline unclear as no data  since 2012.  Marland Kitchen RBBB     Medications:  Prescriptions prior to admission  Medication Sig Dispense Refill Last Dose  . amLODipine (NORVASC) 5 MG tablet Take 1 tablet (5 mg total) by mouth daily. 90 tablet 3 10/15/2015 at Unknown time  . Artificial Tear Ointment (DRY EYES OP) Place 1 drop into both eyes daily as needed (for dry eyes).   10/16/2015 at Unknown time  . Blood Glucose Monitoring Suppl (FREESTYLE FREEDOM) KIT    10/15/2015 at Unknown time  . BRILINTA 90 MG TABS tablet TAKE 1 TABLET BY MOUTH 2 TIMES DAILY. (Patient taking differently: TAKE 90 mg (1 TABLET) BY MOUTH 2 TIMES DAILY.) 60 tablet 9 10/15/2015 at 2100  . busPIRone (BUSPAR) 15 MG tablet Take 15 mg by mouth daily.   10/15/2015 at Unknown time  . carvedilol (COREG) 25 MG tablet Take 0.5 tablets (12.5 mg total) by mouth 2 (two) times daily with a meal. 60 tablet 6 10/15/2015 at Unknown time  . Cholecalciferol (VITAMIN D3) 2000 UNITS TABS Take 4,000 Units by mouth daily.    10/15/2015 at Unknown time  . CYMBALTA 60 MG capsule Take 60 mg by mouth daily.    10/15/2015 at Unknown time  . HYDROcodone-acetaminophen (NORCO) 10-325 MG per tablet Take 1-2 tablets by mouth every 6 (six) hours as needed. For pain   10/15/2015 at Unknown time  .  insulin glargine (LANTUS) 100 UNIT/ML injection Inject 64 Units into the skin at bedtime.    10/15/2015 at Unknown time  . isosorbide mononitrate (IMDUR) 60 MG 24 hr tablet Take 1 tablet (60 mg total) by mouth daily. 30 tablet 11 10/15/2015 at Unknown time  . LINZESS 145 MCG CAPS capsule Take 145 mcg by mouth daily.   10/15/2015 at Unknown time  . lisinopril (PRINIVIL,ZESTRIL) 5 MG tablet Take 5 mg by mouth daily.   10/15/2015 at Unknown time  . mirtazapine (REMERON) 15 MG tablet Take 15 mg by mouth at bedtime.    10/15/2015 at Unknown time  . nitroGLYCERIN (NITROSTAT) 0.4 MG SL tablet Place 1 tablet (0.4 mg total) under the tongue every 5 (five) minutes as needed for chest pain (up to 3 doses). 100 tablet 4 Taking  . NOVOLOG FLEXPEN 100 UNIT/ML FlexPen Inject 40-50 Units into the skin 2 (two) times daily. Use 50 units in the morning and use 40 units in the evening   10/15/2015 at Unknown time  . QUEtiapine (SEROQUEL) 400 MG tablet Take 400 mg by mouth at bedtime.   10/15/2015 at Unknown time    Insulin Requirements in the past 24 hours:  Levemir  70 units + 27 units of SSI  Assessment: S/p CABG now with gastric dilatation had Cortrak, tube feeds now off, unable to tolerate enteral feeds.  Surgeries/Procedures: CABG + Carotid Enderartectomy 4/28 GI: Was on TF, now discontinued on ppi. Last abdominal Xray showed slight increase in the degree of small bowel dilatation. Endo: hx DM. CBGs trending up (160-200s) on Levermir BID, SSI, Victoza. First day that he received both of his Levemir doses so will need to monitor next 24 hrs, should see a trend down tomorrow Lytes: wnl exc Mg 2.6, CoCa 9.5 Renal: SCr 1.21, normalized CrCl ~2m/min. UOP good at 0.832mkg/hr. 1/2NS at 1077mr Pulm: Trach on 60% FiO2 Cards: CAD s/p CABG this admit, HTN, VSS   Zetia, lasix, hydralazine, lopressor Hepatobil: LFTs now slightly elevated, Tbili 1.6, TG 130 Neuro: hx depression, anxiety on Cymbalta, Buspar, Seroquel ID:  Zosyn for serratia pneumonia  Best Practices: Lovenox TPN Access: PICC 5/3 TPN start date: 5/10  Nutritional Goals: per RD recs on 5/9 Kcal: 0814-4818 Protein: > 180 g  Current Nutrition:  NPO Vital HP off Clinimix E5/15 at 40m/hr  Plan:  Continue Clinimix E 5/15 at 736mhr Hold 20% lipid emulsion for first 7 days for ICU patients per ASPEN guidelines (Start date 5/17) This provides 84 g of protein and 1193 kCals per day meeting 44% of protein and 70% of kCal needs Continue MVI and TE in TPN Continue resistant SSI and adjust as needed Continue Levemir 35 units BID Monitor TPN labs F/U ability to restart TFs  Will not be able to meet patients high protein needs with Clinimix. Consider adding feeding supplements if patient able to tolerate.   NaElenor QuinonesPharmD, BCPS Clinical Pharmacist Pager [phone]/05/2016 8:06 AM

## 2015-11-04 ENCOUNTER — Inpatient Hospital Stay (HOSPITAL_COMMUNITY): Payer: Medicare Other

## 2015-11-04 DIAGNOSIS — Z930 Tracheostomy status: Secondary | ICD-10-CM

## 2015-11-04 LAB — GLUCOSE, CAPILLARY
Glucose-Capillary: 135 mg/dL — ABNORMAL HIGH (ref 65–99)
Glucose-Capillary: 157 mg/dL — ABNORMAL HIGH (ref 65–99)
Glucose-Capillary: 183 mg/dL — ABNORMAL HIGH (ref 65–99)
Glucose-Capillary: 184 mg/dL — ABNORMAL HIGH (ref 65–99)
Glucose-Capillary: 196 mg/dL — ABNORMAL HIGH (ref 65–99)
Glucose-Capillary: 209 mg/dL — ABNORMAL HIGH (ref 65–99)
Glucose-Capillary: 259 mg/dL — ABNORMAL HIGH (ref 65–99)

## 2015-11-04 LAB — TSH: TSH: 2.645 u[IU]/mL (ref 0.350–4.500)

## 2015-11-04 LAB — BASIC METABOLIC PANEL
Anion gap: 10 (ref 5–15)
BUN: 26 mg/dL — ABNORMAL HIGH (ref 6–20)
CO2: 27 mmol/L (ref 22–32)
Calcium: 8.6 mg/dL — ABNORMAL LOW (ref 8.9–10.3)
Chloride: 101 mmol/L (ref 101–111)
Creatinine, Ser: 0.97 mg/dL (ref 0.61–1.24)
GFR calc Af Amer: >60 mL/min (ref >60)
GFR calc non Af Amer: >60 mL/min (ref >60)
Glucose, Bld: 175 mg/dL — ABNORMAL HIGH (ref 65–99)
Potassium: 4.1 mmol/L (ref 3.5–5.1)
Sodium: 138 mmol/L (ref 135–145)

## 2015-11-04 LAB — CBC
HCT: 31.7 % — ABNORMAL LOW (ref 39.0–52.0)
Hemoglobin: 9.9 g/dL — ABNORMAL LOW (ref 13.0–17.0)
MCH: 28.9 pg (ref 26.0–34.0)
MCHC: 31.2 g/dL (ref 30.0–36.0)
MCV: 92.4 fL (ref 78.0–100.0)
Platelets: 308 10*3/uL (ref 150–400)
RBC: 3.43 MIL/uL — ABNORMAL LOW (ref 4.22–5.81)
RDW: 14.7 % (ref 11.5–15.5)
WBC: 6.4 10*3/uL (ref 4.0–10.5)

## 2015-11-04 MED ORDER — QUETIAPINE FUMARATE 25 MG PO TABS: 50.0000 mg | ORAL_TABLET | Freq: Every day | ORAL | Status: DC

## 2015-11-04 MED ORDER — AMIODARONE HCL IN DEXTROSE 360-4.14 MG/200ML-% IV SOLN
INTRAVENOUS | Status: AC
  Administered 2015-11-04: 60 mg/h via INTRAVENOUS
  Filled 2015-11-04: qty 400

## 2015-11-04 MED ORDER — AMIODARONE LOAD VIA INFUSION
150.0000 mg | Freq: Once | INTRAVENOUS | Status: AC
  Administered 2015-11-04: 150 mg via INTRAVENOUS

## 2015-11-04 MED ORDER — AMIODARONE HCL IN DEXTROSE 360-4.14 MG/200ML-% IV SOLN
60.0000 mg/h | INTRAVENOUS | Status: AC
  Administered 2015-11-04 (×2): 60 mg/h via INTRAVENOUS

## 2015-11-04 MED ORDER — QUETIAPINE FUMARATE 100 MG PO TABS
100.0000 mg | ORAL_TABLET | Freq: Every day | ORAL | Status: DC
  Administered 2015-11-04 – 2015-11-05 (×2): 100 mg via ORAL
  Filled 2015-11-04 (×2): qty 1

## 2015-11-04 MED ORDER — AMIODARONE HCL IN DEXTROSE 360-4.14 MG/200ML-% IV SOLN
30.0000 mg/h | INTRAVENOUS | Status: DC
  Administered 2015-11-04 – 2015-11-06 (×4): 30 mg/h via INTRAVENOUS
  Filled 2015-11-04 (×4): qty 200

## 2015-11-04 NOTE — Progress Notes (Signed)
Patient ID: Tyler Le, male   DOB: 04-Nov-1945, 70 y.o.   MRN: [id number] EVENING ROUNDS NOTE :     [address]       [place] [id number]             [phone]                 15 Days Post-Op Procedure(s) (LRB): REDO CORONARY ARTERY BYPASS GRAFTING (CABG), TIMES THREE, USING LEFT GREATER SAPHENOUS VEIN HARVESTED ENDOSCOPICALLY AND CRYO VEIN, WITH CORONARY ENDARTERECTOMY (N/A) TRANSESOPHAGEAL ECHOCARDIOGRAM (TEE) (N/A) ENDARTERECTOMY CAROTID (Right)  Total Length of Stay:  LOS: 19 days  BP 128/66 mmHg  Pulse 87  Temp(Src) 98.1 F (36.7 C) (Oral)  Resp 20  Ht 6\' 4"  (1.93 m)  Wt 128.1 kg (282 lb 6.6 oz)  BMI 34.39 kg/m2  SpO2 94%  .Intake/Output      05/13 0701 - 05/14 0700   I.V. (mL/kg) 380.4 (3)   NG/GT 890   IV Piggyback 50   TPN 700   Total Intake(mL/kg) 2020.4 (15.8)   Urine (mL/kg/hr) 1395 (0.9)   Stool    Total Output 1395   Net +625.4         . sodium chloride 10 mL/hr at 10/24/15 1900  . sodium chloride 250 mL (11/04/15 0700)  . sodium chloride 10 mL/hr at 11/04/15 0513  . amiodarone 30 mg/hr (11/04/15 1645)  . feeding supplement (VITAL HIGH PROTEIN) 1,000 mL (11/04/15 1109)  . lactated ringers    . lactated ringers 10 mL/hr at 10/25/15 0700     Lab Results  Component Value Date   WBC 6.4 11/04/2015   HGB 9.9* 11/04/2015   HCT 31.7* 11/04/2015   PLT 308 11/04/2015   GLUCOSE 175* 11/04/2015   CHOL 130 05/09/2011   TRIG 130 11/01/2015   HDL 27* 05/09/2011   LDLCALC 31 05/09/2011   ALT 76* 11/02/2015   AST 52* 11/02/2015   NA 138 11/04/2015   K 4.1 11/04/2015   CL 101 11/04/2015   CREATININE 0.97 11/04/2015   BUN 26* 11/04/2015   CO2 27 11/04/2015   TSH 2.645 11/04/2015   INR 1.35 10/20/2015   HGBA1C 7.1* 10/19/2015   Brief episode of Afib, restarted on Cordarone  Tolerating tube feeding  Delight OvensEdward B Raney Koeppen MD  Beeper [phone] Office [phone] 11/04/2015 7:18 PM

## 2015-11-04 NOTE — Progress Notes (Signed)
Name: Tyler Le MRN: [id number] DOB: 1945/12/30    ADMISSION DATE:  10/16/2015 CONSULTATION DATE:  10/18/15  REFERRING MD : Donata Clay, MD  CHIEF COMPLAINT:  Hypercapnia  DISCUSSION: 70 y/o male with obesity hypoventilation syndrome who underwent a redo CABG and CEA on 4/28. Remains on vent post-operatively - anxiety/encephalopathy, bradycardia/hypotension as barriers -S/p trach 5/5. ABX started on 5/6. Tolerating ATC trials.   SUBJECTIVE:    Up in chair talking with PMV.  No CP, dyspnea afebrile  VITAL SIGNS: Temp:  [98.7 F (37.1 C)-99.2 F (37.3 C)] 98.7 F (37.1 C) (05/13 0328) Pulse Rate:  [90-105] 103 (05/13 0801) Resp:  [14-25] 23 (05/13 0801) BP: (112-173)/(46-90) 167/79 mmHg (05/13 0500) SpO2:  [96 %-99 %] 96 % (05/13 0801) FiO2 (%):  [40 %-60 %] 40 % (05/13 0801) Weight:  [282 lb 6.6 oz (128.1 kg)] 282 lb 6.6 oz (128.1 kg) (05/13 0500)  Intake/Output Summary (Last 24 hours) at 11/04/15 0819 Last data filed at 11/04/15 0700  Gross per 24 hour  Intake   3340 ml  Output   1751 ml  Net   1589 ml   PHYSICAL EXAMINATION: General:  Obese male; alert and interactiveoob to cahir HENT: NCAT; trach unremarkable  PULM: resps even non labored on ATC and PMV, CTA bilaterally. CV: RRR, no clear murmur GI: BS+, soft, distended MSK: normal bulk and tone, moves all extremities Neuro: alert, interactive, speech ok,, follows commands  Recent Labs Lab 11/02/15 0302 11/03/15 0416 11/03/15 1813 11/03/15 1840 11/04/15 0428  NA 139 138 134* 140 138  K 4.3 4.5 5.1 4.0 4.1  CL 102 102 95* 99* 101  CO2 27 26  --   --  27  BUN 23* 25* 28* 32* 26*  CREATININE 1.21 1.01 1.10 1.00 0.97  GLUCOSE 207* 194* 567* 184* 175*    Recent Labs Lab 11/02/15 0302 11/03/15 0416 11/03/15 1813 11/03/15 1840 11/04/15 0428  HGB 14.6 9.7* 10.5* 10.9* 9.9*  HCT 46.3 31.2* 31.0* 32.0* 31.7*  WBC 3.7* 8.7  --   --  6.4  PLT 181 302  --   --  308   Dg Chest Port 1 View  11/03/2015   CLINICAL DATA:  Pulmonary vascular congestion EXAM: PORTABLE CHEST 1 VIEW COMPARISON:  Nov 02, 2015 FINDINGS: Tracheostomy catheter tip is 4.0 cm above carina. Central catheter tip is in the superior vena cava. No pneumothorax. Airspace consolidation in the bases remains stable. No new opacity. Heart is enlarged. The pulmonary vascularity is normal. IMPRESSION: Stable cardiac prominence. Airspace consolidation is noted in the lower lobe regions, more severe on the left than on the right, likely a combination of atelectasis and pneumonia. Tube and catheter positions as described without pneumothorax. Electronically Signed   By: Bretta Bang III M.D.   On: 11/03/2015 08:13   Dg Abd Portable 1v  11/03/2015  CLINICAL DATA:  Encounter for feeding tube placement EXAM: PORTABLE ABDOMEN - 1 VIEW COMPARISON:  Portable exam 0902 hours compared to 11/03/2015 at 0650 hours FINDINGS: New feeding tube with tip projecting over duodenal bulb. Question pacing wires. Nonobstructive bowel gas pattern. Bones unremarkable. IMPRESSION: Tip of feeding tube projects over duodenal bulb. Electronically Signed   By: Ulyses Southward M.D.   On: 11/03/2015 09:15   Dg Abd Portable 1v  11/03/2015  CLINICAL DATA:  Bowel ileus EXAM: PORTABLE ABDOMEN - 1 VIEW COMPARISON:  Nov 02, 2015 FINDINGS: There is mild colonic dilatation. There is no appreciable small bowel dilatation.  No air-fluid levels are noted. No free air is seen on this supine examination. Previous nasogastric tube is not appreciable currently. IMPRESSION: Evidence a degree of colonic ileus. No demonstrable small bowel obstruction or free air. Electronically Signed   By: Bretta Bang III M.D.   On: 11/03/2015 08:14    STUDIES:  PFT 4/25 >> restrictive lung dz, low DLCO diffusion LE Doppler 5/2 >>  negative  CULTURES: Sputum 5/5 >>few yeast consistent with candida species Sputum 5/6>>>serratia>>> RES cefazolin, otherwise sens  BC 5/6>>> Urine  5/6>>>GNR>>>Serratia>>>RES cefazolin and nitrofurantoin   ANTIBIOTICS: Cefuroxime 4/28 >> 4/30 vanc 5/6>>off  Zosyn 5/6>>>5/11 Ceftriaxone 5/11>>>  SIGNIFICANT EVENTS: 4/28 CABG and CEA 5/01 Bradycardia / hypotension with any weaning of sedation 5/02 Episodes of desaturation, increased O2 needs 5/03 Weaned off dopamine, neo, sedation reduced 5/5 trach.  5/6 fevers, still on fio2 60%/peep 8. ABX started; lasix cut back to daily  5/7 Creatinine up. Lasix stopped. Weaning on PS 10/peep 8, fent gtt stopped and changed to PRN  LINES/TUBES: 4/28  midine R arm >>5/1  4/28  L subclavian introducer >  4/28  R neck drain >> 4/30 4/28  mediastinal chest tubes >> 4/30 5/01  PICC >> Trach 5/5 (JY)>>>     ASSESSMENT / PLAN:  PULMONARY A: Vent dependence  Chronic respiratory failure with hypercapnea > improving Obesity hypoventilation syndrome Tracheostomy status. S/p trach 5/5 Serratia PNA P:   TC as tolerated. No capping of trach at night given OSA history -hopefully he will not need noct vent Mobilize as able - PT involved. No plans for decannulation at this point, change to a cuffless 4 on Monday. Intermittent f/u CXR.   NEUROLOGIC A:   Acute encephalopathy > improving slowly Baseline mental illness, poorly understood, multiple medications at home Chronic pain  MS improved as of 5/7. Wonder if fever +/- some level of infection was contributing factor P:   PRN fentanyl  Decreased seroquel to 50 BID on 5/12 and follow QTc interval closely, further titrate as able Continue cymbalta, buspar Mobilize  CARDIOVASCULAR A:  S/p CABG and (R) CEA P:  Per vascular and TCTS Monitor hemodynamics Tele  RENAL A:   AKI - initially improving then Scr rising 5/7.  Improved 5/8 off diuresis.  P:   Monitor BMET and UOP Replace electrolytes as needed   change to 40 mg PO lasix daily.  GASTROINTESTINAL A:   Morbid Obesity  P:   Cortrak placed 5/1, continue TF PPI. Reglan  scheduled. Can dc TPN in my opinon,  ordered by CVTS. Swallow eval per Speech   HEMATOLOGIC A:   Anemia, no bleeding Thrombocytopenia - resolved  P:  Transfusion per usual ICU guidelines. Lovenox. CBC in AM.  INFECTIOUS A:   Recurrent fever 5/6, normal WBC;  No infiltrate on CXR 5/7 Serratia UTI  Serratia PNA P:   Narrow abx to ceftriaxone 5/11  ENDOCRINE A:   Hyperglycemia P:   Continue home liraglutide, SSI, levemir  FAMILY  - Updates: pt and wife updated at bedside 5/12.  PCCM will continue to follow for trach management,   Will see again on Monday. Can dc TNA  Now that TFs to goal  Cyril Mourning MD. FCCP. [facility] Pager [phone] If no response call [phone]   11/04/2015

## 2015-11-04 NOTE — Progress Notes (Signed)
PARENTERAL NUTRITION CONSULT NOTE - Follow up  Pharmacy Consult for TPN Indication: Intolerance to enteral feeding  Allergies  Allergen Reactions  . Metformin And Related Nausea And Vomiting  . Pravachol Nausea And Vomiting  . Ramipril Nausea Only  . Wellbutrin [Bupropion Hcl] Nausea And Vomiting    Patient Measurements: Height: '6\' 4"'  (193 cm) Weight: 282 lb 6.6 oz (128.1 kg) IBW/kg (Calculated) : 86.8  Adjusted body weight: 82 kg  Vital Signs: Temp: 98.7 F (37.1 C) (05/13 0328) Temp Source: Oral (05/13 0328) BP: 167/79 mmHg (05/13 0500) Pulse Rate: 102 (05/13 0500) Intake/Output from previous day: 05/12 0701 - 05/13 0700 In: 1638 [I.V.:430; NG/GT:1180; IV Piggyback:50; TPN:1680] Out: 1751 [Urine:1750; Stool:1] Intake/Output from this shift:    Labs:  Recent Labs  11/02/15 0302 11/03/15 0416 11/03/15 1813 11/03/15 1840 11/04/15 0428  WBC 3.7* 8.7  --   --  6.4  HGB 14.6 9.7* 10.5* 10.9* 9.9*  HCT 46.3 31.2* 31.0* 32.0* 31.7*  PLT 181 302  --   --  308     Recent Labs  11/01/15 1030  11/02/15 0302 11/03/15 0416 11/03/15 1813 11/03/15 1840 11/04/15 0428  NA  --   < > 139 138 134* 140 138  K  --   < > 4.3 4.5 5.1 4.0 4.1  CL  --   < > 102 102 95* 99* 101  CO2  --   --  27 26  --   --  27  GLUCOSE  --   < > 207* 194* 567* 184* 175*  BUN  --   < > 23* 25* 28* 32* 26*  CREATININE  --   < > 1.21 1.01 1.10 1.00 0.97  CALCIUM  --   --  8.6* 8.6*  --   --  8.6*  MG  --   --  2.6*  --   --   --   --   PHOS  --   --  3.9  --   --   --   --   PROT 6.5  --  6.7  --   --   --   --   ALBUMIN 2.3*  --  2.6*  --   --   --   --   AST 66*  --  52*  --   --   --   --   ALT 83*  --  76*  --   --   --   --   ALKPHOS 86  --  84  --   --   --   --   BILITOT 1.7*  --  1.6*  --   --   --   --   BILIDIR 0.8*  --   --   --   --   --   --   IBILI 0.9  --   --   --   --   --   --   PREALBUMIN 10.9*  --   --   --   --   --   --   TRIG 130  --   --   --   --   --   --   <  > = values in this interval not displayed. Estimated Creatinine Clearance: 102.1 mL/min (by C-G formula based on Cr of 0.97).    Recent Labs  11/03/15 1944 11/04/15 11/04/15 0326  GLUCAP 182* 209* 157*    Medical History: Past Medical History  Diagnosis Date  . Essential hypertension   . Dyslipidemia   . Coronary artery disease     a. 2001: s/p CABG X4;  b. 08/2010 s/p PCI/BMS to VG->RI->OM;  c. 06/2014 Cath/PCI: LM nl, LAD 165m LCX 771mRI 100, OM1 100, OM2 80, small, RCA 100ost, VG->RCA 100, VG->Diag 100, VG->RI->OM1 70-80 ISR (4.0x24 Promus DES), LIMA->LAD nl.  . Morbid obesity ([facility]  . Dysrhythmia   . History of blood transfusion 2001    "related to OHS"  . Anemia   . GERD (gastroesophageal reflux disease)   . Depression   . Anxiety   . Nephrolithiasis   . Complication of anesthesia     "they have a hard time waking him up"  . Type II diabetes mellitus ([facility]  . Arthritis     "back, neck" (07/11/2014)  . Chronic lower back pain   . Thrombocytopenia ([place]    a. Borderline low platelets by labs noted in 06/2014 but also noted on prior labs as well.  . Renal insufficiency     a. noted 06/2014 - baseline unclear as no data since 2012.  . Marland KitchenBBB     Medications:  Prescriptions prior to admission  Medication Sig Dispense Refill Last Dose  . amLODipine (NORVASC) 5 MG tablet Take 1 tablet (5 mg total) by mouth daily. 90 tablet 3 10/15/2015 at Unknown time  . Artificial Tear Ointment (DRY EYES OP) Place 1 drop into both eyes daily as needed (for dry eyes).   10/16/2015 at Unknown time  . Blood Glucose Monitoring Suppl (FREESTYLE FREEDOM) KIT    10/15/2015 at Unknown time  . BRILINTA 90 MG TABS tablet TAKE 1 TABLET BY MOUTH 2 TIMES DAILY. (Patient taking differently: TAKE 90 mg (1 TABLET) BY MOUTH 2 TIMES DAILY.) 60 tablet 9 10/15/2015 at 2100  . busPIRone (BUSPAR) 15 MG tablet Take 15 mg by mouth daily.   10/15/2015 at Unknown time  . carvedilol (COREG) 25 MG tablet Take 0.5 tablets  (12.5 mg total) by mouth 2 (two) times daily with a meal. 60 tablet 6 10/15/2015 at Unknown time  . Cholecalciferol (VITAMIN D3) 2000 UNITS TABS Take 4,000 Units by mouth daily.    10/15/2015 at Unknown time  . CYMBALTA 60 MG capsule Take 60 mg by mouth daily.    10/15/2015 at Unknown time  . HYDROcodone-acetaminophen (NORCO) 10-325 MG per tablet Take 1-2 tablets by mouth every 6 (six) hours as needed. For pain   10/15/2015 at Unknown time  . insulin glargine (LANTUS) 100 UNIT/ML injection Inject 64 Units into the skin at bedtime.    10/15/2015 at Unknown time  . isosorbide mononitrate (IMDUR) 60 MG 24 hr tablet Take 1 tablet (60 mg total) by mouth daily. 30 tablet 11 10/15/2015 at Unknown time  . LINZESS 145 MCG CAPS capsule Take 145 mcg by mouth daily.   10/15/2015 at Unknown time  . lisinopril (PRINIVIL,ZESTRIL) 5 MG tablet Take 5 mg by mouth daily.   10/15/2015 at Unknown time  . mirtazapine (REMERON) 15 MG tablet Take 15 mg by mouth at bedtime.    10/15/2015 at Unknown time  . nitroGLYCERIN (NITROSTAT) 0.4 MG SL tablet Place 1 tablet (0.4 mg total) under the tongue every 5 (five) minutes as needed for chest pain (up to 3 doses). 100 tablet 4 Taking  . NOVOLOG FLEXPEN 100 UNIT/ML FlexPen Inject 40-50 Units into the skin 2 (two) times daily. Use 50 units in the morning and use 40 units in  the evening   10/15/2015 at Unknown time  . QUEtiapine (SEROQUEL) 400 MG tablet Take 400 mg by mouth at bedtime.   10/15/2015 at Unknown time    Insulin Requirements in the past 24 hours:  Levemir  70 units + 36 units of SSI  Assessment: S/p CABG now with gastric dilatation had Cortrak, tube feeds now off, unable to tolerate enteral feeds.  Surgeries/Procedures: CABG + Carotid Enderartectomy 4/28 GI: Was on TF, now discontinued on ppi. Last abdominal Xray showed slight increase in the degree of small bowel dilatation. Endo: hx DM. CBGs trending up (150-230s) on Levermir BID, SSI, Victoza. First day that he received  both of his Levemir doses so will need to monitor next 24 hrs, should see a trend down tomorrow Lytes: wnl exc Mg 2.6 (5/11), CoCa 9.5 Renal: SCr 1.21, normalized CrCl ~54m/min. UOP good at 0.652mkg/hr. 1/2NS at 1042mr Pulm: Trach on 60% FiO2 Cards: CAD s/p CABG this admit, HTN, VSS   Zetia, lasix, hydralazine, lopressor Hepatobil: LFTs now slightly elevated, Tbili 1.6, TG 130 Neuro: hx depression, anxiety on Cymbalta, Buspar, Seroquel ID: Zosyn for serratia pneumonia Best Practices: Lovenox TPN Access: PICC 5/3 TPN start date: 5/10  Nutritional Goals: per RD recs on 5/12 Kcal: 2300-2500 Protein: 140-150 g  Current Nutrition:  NPO Vital HP at 37m31m (Provides 105 g of protein and 1,200 kcal daily) Clinimix E5/15 at 70ml66m Plan:  Continue Vital HP and titrate to goal   Stop TPN today Discussed with RN, will continue current bag and cut rate in half around 1600 tpday for 1-2 hours before stopping TPN Discontinue TPN orders and labs Leave on resistant SSI and Levemir BID for now with TFs  NathaElenor QuinonesrmD, BCPS Clinical Pharmacist Pager [phone]/2017 7:31 AM

## 2015-11-04 NOTE — Progress Notes (Signed)
Patient ID: Tyler Le, male   DOB: 02-12-1946, 70 y.o.   MRN: [id number] TCTS DAILY ICU PROGRESS NOTE                   [address]            [place] [address]          [phone]   15 Days Post-Op Procedure(s) (LRB): REDO CORONARY ARTERY BYPASS GRAFTING (CABG), TIMES THREE, USING LEFT GREATER SAPHENOUS VEIN HARVESTED ENDOSCOPICALLY AND CRYO VEIN, WITH CORONARY ENDARTERECTOMY (N/A) TRANSESOPHAGEAL ECHOCARDIOGRAM (TEE) (N/A) ENDARTERECTOMY CAROTID (Right)  Total Length of Stay:  LOS: 19 days   Subjective: Up to chair , awake and alert , talkative , bm last pm   Objective: Vital signs in last 24 hours: Temp:  [98.5 F (36.9 C)-99.2 F (37.3 C)] 98.5 F (36.9 C) (05/13 0821) Pulse Rate:  [90-105] 103 (05/13 0801) Cardiac Rhythm:  [-] Normal sinus rhythm;Sinus tachycardia (05/13 0815) Resp:  [14-25] 23 (05/13 0801) BP: (112-173)/(46-90) 167/79 mmHg (05/13 0500) SpO2:  [96 %-99 %] 96 % (05/13 0801) FiO2 (%):  [40 %-60 %] 40 % (05/13 0801) Weight:  [128.1 kg (282 lb 6.6 oz)] 128.1 kg (282 lb 6.6 oz) (05/13 0500)  Filed Weights   11/02/15 0600 11/03/15 0600 11/04/15 0500  Weight: 127 kg (279 lb 15.8 oz) 127 kg (279 lb 15.8 oz) 128.1 kg (282 lb 6.6 oz)    Weight change: 1.1 kg (2 lb 6.8 oz)   Hemodynamic parameters for last 24 hours:    Intake/Output from previous day: 05/12 0701 - 05/13 0700 In: 3490 [I.V.:460; NG/GT:1230; IV Piggyback:50; TPN:1750] Out: 1751 [Urine:1750; Stool:1]  Intake/Output this shift:    Current Meds: Scheduled Meds: . antiseptic oral rinse  7 mL Mouth Rinse QID  . aspirin EC  325 mg Oral Daily   Or  . aspirin  324 mg Per Tube Daily  . bisacodyl  10 mg Oral Daily   Or  . bisacodyl  10 mg Rectal Daily  . busPIRone  15 mg Oral Daily  . cefTRIAXone (ROCEPHIN)  IV  2 g Intravenous Q24H  . chlorhexidine gluconate (SAGE KIT)  15 mL Mouth Rinse BID  . docusate sodium  200 mg Oral Daily  . DULoxetine  60 mg Oral Daily  .  enoxaparin (LOVENOX) injection  40 mg Subcutaneous Q24H  . ezetimibe  10 mg Oral Daily  . furosemide  20 mg Intravenous BID  . hydrALAZINE  10 mg Intravenous Q8H  . insulin aspart  0-20 Units Subcutaneous Q4H  . insulin detemir  35 Units Subcutaneous BID  . ipratropium  0.5 mg Nebulization TID  . levalbuterol  1.25 mg Nebulization TID  . linaclotide  145 mcg Oral Daily  . Liraglutide  1.2 mg Subcutaneous Daily  . metoCLOPramide (REGLAN) injection  10 mg Intravenous Q6H  . metoprolol  10 mg Intravenous Q6H  . oxymetazoline  2 spray Each Nare BID  . pantoprazole sodium  40 mg Per Tube Q1200  . QUEtiapine  100 mg Oral QHS  . sodium chloride flush  10-40 mL Intracatheter Q12H  . sodium chloride flush  3 mL Intravenous Q12H   Continuous Infusions: . Marland KitchenTPN (CLINIMIX-E) Adult 70 mL/hr at 11/04/15 0700  . sodium chloride 10 mL/hr at 10/24/15 1900  . sodium chloride 250 mL (11/04/15 0700)  . sodium chloride 10 mL/hr at 11/04/15 0513  . feeding supplement (VITAL HIGH PROTEIN) 1,000 mL (11/04/15 0700)  .  lactated ringers    . lactated ringers 10 mL/hr at 10/25/15 0700   PRN Meds:.fentaNYL (SUBLIMAZE) injection, Jadene Stemmer's butt cream, hydrALAZINE, HYDROcodone-acetaminophen, levalbuterol, ondansetron (ZOFRAN) IV, ondansetron (ZOFRAN) IV, potassium chloride, sodium chloride flush, traMADol  General appearance: alert, cooperative and no distress Neurologic: intact Heart: regular rate and rhythm, S1, S2 normal, no murmur, click, rub or gallop Lungs: diminished breath sounds bibasilar Abdomen: soft, non-tender; bowel sounds normal; no masses,  no organomegaly Extremities: extremities normal, atraumatic, no cyanosis or edema Wound: sternum intact  Lab Results: CBC: Recent Labs  11/03/15 0416  11/03/15 1840 11/04/15 0428  WBC 8.7  --   --  6.4  HGB 9.7*  < > 10.9* 9.9*  HCT 31.2*  < > 32.0* 31.7*  PLT 302  --   --  308  < > = values in this interval not displayed. BMET:  Recent Labs   11/03/15 0416  11/03/15 1840 11/04/15 0428  NA 138  < > 140 138  K 4.5  < > 4.0 4.1  CL 102  < > 99* 101  CO2 26  --   --  27  GLUCOSE 194*  < > 184* 175*  BUN 25*  < > 32* 26*  CREATININE 1.01  < > 1.00 0.97  CALCIUM 8.6*  --   --  8.6*  < > = values in this interval not displayed.  PT/INR: No results for input(s): LABPROT, INR in the last 72 hours. Radiology: Dg Abd Portable 1v  11/03/2015  CLINICAL DATA:  Encounter for feeding tube placement EXAM: PORTABLE ABDOMEN - 1 VIEW COMPARISON:  Portable exam 0902 hours compared to 11/03/2015 at 0650 hours FINDINGS: New feeding tube with tip projecting over duodenal bulb. Question pacing wires. Nonobstructive bowel gas pattern. Bones unremarkable. IMPRESSION: Tip of feeding tube projects over duodenal bulb. Electronically Signed   By: Ulyses Southward M.D.   On: 11/03/2015 09:15     Assessment/Plan: S/P Procedure(s) (LRB): REDO CORONARY ARTERY BYPASS GRAFTING (CABG), TIMES THREE, USING LEFT GREATER SAPHENOUS VEIN HARVESTED ENDOSCOPICALLY AND CRYO VEIN, WITH CORONARY ENDARTERECTOMY (N/A) TRANSESOPHAGEAL ECHOCARDIOGRAM (TEE) (N/A) ENDARTERECTOMY CAROTID (Right) Tolerating full dose tube feeding , d/c tna with current bag Foley in two weeks will try to d/c  Swallowing evaluation monday     [name] 11/04/2015 8:45 AM

## 2015-11-04 NOTE — Progress Notes (Signed)
Speech Pathology:  Pt tolerating PMV quite well.  MD notes in chart mention swallow evaluation on Monday; pt also due for trach downsize Monday to cuffless #4.  TPN to stop today and enteral feeds will be started.  PLEASE ORDER SLP SWALLOW EVALUATION and we will proceed.   Thank you, Kaydra Borgen L. Samson Fredericouture, [place] CCC/SLP Pager [phone]

## 2015-11-05 ENCOUNTER — Inpatient Hospital Stay (HOSPITAL_COMMUNITY): Payer: Medicare Other

## 2015-11-05 LAB — BASIC METABOLIC PANEL
Anion gap: 11 (ref 5–15)
BUN: 34 mg/dL — ABNORMAL HIGH (ref 6–20)
CO2: 28 mmol/L (ref 22–32)
Calcium: 8.5 mg/dL — ABNORMAL LOW (ref 8.9–10.3)
Chloride: 100 mmol/L — ABNORMAL LOW (ref 101–111)
Creatinine, Ser: 1.08 mg/dL (ref 0.61–1.24)
GFR calc Af Amer: >60 mL/min (ref >60)
GFR calc non Af Amer: >60 mL/min (ref >60)
Glucose, Bld: 196 mg/dL — ABNORMAL HIGH (ref 65–99)
Potassium: 4.3 mmol/L (ref 3.5–5.1)
Sodium: 139 mmol/L (ref 135–145)

## 2015-11-05 LAB — GLUCOSE, CAPILLARY
Glucose-Capillary: 169 mg/dL — ABNORMAL HIGH (ref 65–99)
Glucose-Capillary: 179 mg/dL — ABNORMAL HIGH (ref 65–99)
Glucose-Capillary: 204 mg/dL — ABNORMAL HIGH (ref 65–99)
Glucose-Capillary: 139 mg/dL — ABNORMAL HIGH (ref 65–99)
Glucose-Capillary: 142 mg/dL — ABNORMAL HIGH (ref 65–99)

## 2015-11-05 LAB — CBC
HCT: 30.7 % — ABNORMAL LOW (ref 39.0–52.0)
Hemoglobin: 9.3 g/dL — ABNORMAL LOW (ref 13.0–17.0)
MCH: 28.3 pg (ref 26.0–34.0)
MCHC: 30.3 g/dL (ref 30.0–36.0)
MCV: 93.3 fL (ref 78.0–100.0)
Platelets: 285 10*3/uL (ref 150–400)
RBC: 3.29 MIL/uL — ABNORMAL LOW (ref 4.22–5.81)
RDW: 14.8 % (ref 11.5–15.5)
WBC: 5.9 10*3/uL (ref 4.0–10.5)

## 2015-11-05 LAB — MAGNESIUM: Magnesium: 2.2 mg/dL (ref 1.7–2.4)

## 2015-11-05 NOTE — Progress Notes (Signed)
Patient ID: Tyler Le, male   DOB: Oct 15, 1945, 70 y.o.   MRN: [id number] EVENING ROUNDS NOTE :     [address]       [place] [id number]             [phone]                 16 Days Post-Op Procedure(s) (LRB): REDO CORONARY ARTERY BYPASS GRAFTING (CABG), TIMES THREE, USING LEFT GREATER SAPHENOUS VEIN HARVESTED ENDOSCOPICALLY AND CRYO VEIN, WITH CORONARY ENDARTERECTOMY (N/A) TRANSESOPHAGEAL ECHOCARDIOGRAM (TEE) (N/A) ENDARTERECTOMY CAROTID (Right)  Total Length of Stay:  LOS: 20 days  BP 122/66 mmHg  Pulse 84  Temp(Src) 98.3 F (36.8 C) (Oral)  Resp 21  Ht 6\' 4"  (1.93 m)  Wt 128.3 kg (282 lb 13.6 oz)  BMI 34.44 kg/m2  SpO2 97%  .Intake/Output      05/13 0701 - 05/14 0700 05/14 0701 - 05/15 0700   I.V. (mL/kg) 727.5 (5.7) 267 (2.1)   NG/GT 1700 540   IV Piggyback 50 50   TPN 980    Total Intake(mL/kg) 3457.5 (26.9) 857 (6.7)   Urine (mL/kg/hr) 2345 (0.8) 550 (0.4)   Stool  0 (0)   Total Output 2345 550   Net +1112.5 +307        Urine Occurrence  4 x   Stool Occurrence  1 x     . sodium chloride 10 mL/hr at 10/24/15 1900  . sodium chloride 250 mL (11/04/15 0700)  . sodium chloride 10 mL/hr at 11/04/15 0513  . amiodarone 30 mg/hr (11/05/15 1800)  . feeding supplement (VITAL HIGH PROTEIN) 1,000 mL (11/05/15 1712)  . lactated ringers    . lactated ringers 10 mL/hr at 10/25/15 0700     Lab Results  Component Value Date   WBC 5.9 11/05/2015   HGB 9.3* 11/05/2015   HCT 30.7* 11/05/2015   PLT 285 11/05/2015   GLUCOSE 196* 11/05/2015   CHOL 130 05/09/2011   TRIG 130 11/01/2015   HDL 27* 05/09/2011   LDLCALC 31 05/09/2011   ALT 76* 11/02/2015   AST 52* 11/02/2015   NA 139 11/05/2015   K 4.3 11/05/2015   CL 100* 11/05/2015   CREATININE 1.08 11/05/2015   BUN 34* 11/05/2015   CO2 28 11/05/2015   TSH 2.645 11/04/2015   INR 1.35 10/20/2015   HGBA1C 7.1* 10/19/2015   Stable day, wants more anxiety meds   Delight OvensEdward B Georgeann Brinkman MD  Beeper  [phone] Office [phone] 11/05/2015 6:58 PM

## 2015-11-05 NOTE — Progress Notes (Signed)
  Amiodarone Drug - Drug Interaction Consult Note  Recommendations: Continue current therapy. Rate control with amio and metoprolol. K 4.3 and Mg 2.2 on furosemide 20 mg IV BID. Patient also on quetiapine 100 mg qhs, last QTc 567 on 5/12 EKG. Previously on 400 mg qhs PTA and has been tapered down. Would continue to taper down to 50 mg or d/c if tolerated by patient.  Amiodarone is metabolized by the cytochrome P450 system and therefore has the potential to cause many drug interactions. Amiodarone has an average plasma half-life of 50 days (range 20 to 100 days).   There is potential for drug interactions to occur several weeks or months after stopping treatment and the onset of drug interactions may be slow after initiating amiodarone.   []  Statins: Increased risk of myopathy. Simvastatin- restrict dose to 20mg  daily. Other statins: counsel patients to report any muscle pain or weakness immediately.  []  Anticoagulants: Amiodarone can increase anticoagulant effect. Consider warfarin dose reduction. Patients should be monitored closely and the dose of anticoagulant altered accordingly, remembering that amiodarone levels take several weeks to stabilize.  []  Antiepileptics: Amiodarone can increase plasma concentration of phenytoin, the dose should be reduced. Note that small changes in phenytoin dose can result in large changes in levels. Monitor patient and counsel on signs of toxicity.  [x]  Beta blockers: increased risk of bradycardia, AV block and myocardial depression. Sotalol - avoid concomitant use.  []   Calcium channel blockers (diltiazem and verapamil): increased risk of bradycardia, AV block and myocardial depression.  []   Cyclosporine: Amiodarone increases levels of cyclosporine. Reduced dose of cyclosporine is recommended.  []  Digoxin dose should be halved when amiodarone is started.  [x]  Diuretics: increased risk of cardiotoxicity if hypokalemia occurs.  []  Oral hypoglycemic agents  (glyburide, glipizide, glimepiride): increased risk of hypoglycemia. Patient's glucose levels should be monitored closely when initiating amiodarone therapy.   [x]  Drugs that prolong the QT interval:  Torsades de pointes risk may be increased with concurrent use - avoid if possible.  Monitor QTc, also keep magnesium/potassium WNL if concurrent therapy can't be avoided. Marland Kitchen. Antibiotics: e.g. fluoroquinolones, erythromycin. . Antiarrhythmics: e.g. quinidine, procainamide, disopyramide, sotalol. . Antipsychotics: e.g. phenothiazines, haloperidol.  . Lithium, tricyclic antidepressants, and methadone. Thank You,   Hillery AldoElizabeth Brighton Pilley, Pharm.D., BCPS PGY2 Cardiology Pharmacy Resident Pager: [phone] 11/05/2015 9:27 AM

## 2015-11-05 NOTE — Progress Notes (Addendum)
Patient ID: Tyler Le, male   DOB: 1945-08-23, 70 y.o.   MRN: [id number] Patient ID: Tyler Le, male   DOB: 08/10/1945, 70 y.o.   MRN: [id number] TCTS DAILY ICU PROGRESS NOTE                   [address]            [place] [place] [address]          [phone]   16 Days Post-Op Procedure(s) (LRB): REDO CORONARY ARTERY BYPASS GRAFTING (CABG), TIMES THREE, USING LEFT GREATER SAPHENOUS VEIN HARVESTED ENDOSCOPICALLY AND CRYO VEIN, WITH CORONARY ENDARTERECTOMY (N/A) TRANSESOPHAGEAL ECHOCARDIOGRAM (TEE) (N/A) ENDARTERECTOMY CAROTID (Right)  Total Length of Stay:  LOS: 20 days   Subjective: Up to chair , awake and alert , talkative , more anxious today wanting more meds  Objective: Vital signs in last 24 hours: Temp:  [97.7 F (36.5 C)-98.7 F (37.1 C)] 98.2 F (36.8 C) (05/14 0811) Pulse Rate:  [73-142] 77 (05/14 0727) Cardiac Rhythm:  [-] Atrial fibrillation (05/14 0800) Resp:  [16-27] 20 (05/14 0727) BP: (105-157)/(41-113) 153/79 mmHg (05/14 0600) SpO2:  [92 %-99 %] 97 % (05/14 0727) FiO2 (%):  [40 %-60 %] 60 % (05/14 0727) Weight:  [128.3 kg (282 lb 13.6 oz)] 128.3 kg (282 lb 13.6 oz) (05/14 0600)  Filed Weights   11/03/15 0600 11/04/15 0500 11/05/15 0600  Weight: 127 kg (279 lb 15.8 oz) 128.1 kg (282 lb 6.6 oz) 128.3 kg (282 lb 13.6 oz)    Weight change: 0.2 kg (7.1 oz)   Hemodynamic parameters for last 24 hours:    Intake/Output from previous day: 05/13 0701 - 05/14 0700 In: 3457.5 [I.V.:727.5; NG/GT:1700; IV Piggyback:50; TPN:980] Out: 2345 [Urine:2345]  Intake/Output this shift: Total I/O In: 86.7 [I.V.:26.7; NG/GT:60] Out: 100 [Urine:100]  Current Meds: Scheduled Meds: . antiseptic oral rinse  7 mL Mouth Rinse QID  . aspirin EC  325 mg Oral Daily   Or  . aspirin  324 mg Per Tube Daily  . bisacodyl  10 mg Oral Daily   Or  . bisacodyl  10 mg Rectal Daily  . busPIRone  15 mg Oral Daily  . cefTRIAXone (ROCEPHIN)  IV  2 g Intravenous  Q24H  . chlorhexidine gluconate (SAGE KIT)  15 mL Mouth Rinse BID  . docusate sodium  200 mg Oral Daily  . DULoxetine  60 mg Oral Daily  . enoxaparin (LOVENOX) injection  40 mg Subcutaneous Q24H  . ezetimibe  10 mg Oral Daily  . furosemide  20 mg Intravenous BID  . hydrALAZINE  10 mg Intravenous Q8H  . insulin aspart  0-20 Units Subcutaneous Q4H  . insulin detemir  35 Units Subcutaneous BID  . ipratropium  0.5 mg Nebulization TID  . levalbuterol  1.25 mg Nebulization TID  . linaclotide  145 mcg Oral Daily  . Liraglutide  1.2 mg Subcutaneous Daily  . metoCLOPramide (REGLAN) injection  10 mg Intravenous Q6H  . metoprolol  10 mg Intravenous Q6H  . oxymetazoline  2 spray Each Nare BID  . pantoprazole sodium  40 mg Per Tube Q1200  . QUEtiapine  100 mg Oral QHS  . sodium chloride flush  10-40 mL Intracatheter Q12H  . sodium chloride flush  3 mL Intravenous Q12H   Continuous Infusions: . sodium chloride 10 mL/hr at 10/24/15 1900  . sodium chloride 250 mL (11/04/15 0700)  . sodium chloride 10 mL/hr at 11/04/15 0513  . amiodarone  30 mg/hr (11/05/15 0800)  . feeding supplement (VITAL HIGH PROTEIN) 1,000 mL (11/05/15 0800)  . lactated ringers    . lactated ringers 10 mL/hr at 10/25/15 0700   PRN Meds:.fentaNYL (SUBLIMAZE) injection, Tyler Le's butt cream, hydrALAZINE, HYDROcodone-acetaminophen, levalbuterol, ondansetron (ZOFRAN) IV, ondansetron (ZOFRAN) IV, potassium chloride, sodium chloride flush, traMADol  General appearance: alert, cooperative and no distress Neurologic: intact Heart: regular rate and rhythm, S1, S2 normal, no murmur, click, rub or gallop Lungs: diminished breath sounds bibasilar Abdomen: soft, non-tender; bowel sounds normal; no masses,  no organomegaly Extremities: extremities normal, atraumatic, no cyanosis or edema Wound: sternum intact  Lab Results: CBC:  Recent Labs  11/04/15 0428 11/05/15 0423  WBC 6.4 5.9  HGB 9.9* 9.3*  HCT 31.7* 30.7*  PLT 308  285   BMET:   Recent Labs  11/04/15 0428 11/05/15 0423  NA 138 139  K 4.1 4.3  CL 101 100*  CO2 27 28  GLUCOSE 175* 196*  BUN 26* 34*  CREATININE 0.97 1.08  CALCIUM 8.6* 8.5*    PT/INR: No results for input(s): LABPROT, INR in the last 72 hours. Radiology: No results found.   Assessment/Plan: S/P Procedure(s) (LRB): REDO CORONARY ARTERY BYPASS GRAFTING (CABG), TIMES THREE, USING LEFT GREATER SAPHENOUS VEIN HARVESTED ENDOSCOPICALLY AND CRYO VEIN, WITH CORONARY ENDARTERECTOMY (N/A) TRANSESOPHAGEAL ECHOCARDIOGRAM (TEE) (N/A) ENDARTERECTOMY CAROTID (Right) Tolerating full dose tube feeding ,off tna now Foley in two weeks will try to d/c  Swallowing evaluation monday Needs ambulation No more svt/a fib Episodic afib - on cordone drip   Tyler Le 11/05/2015 8:56 AM

## 2015-11-06 DIAGNOSIS — I25118 Atherosclerotic heart disease of native coronary artery with other forms of angina pectoris: Secondary | ICD-10-CM

## 2015-11-06 DIAGNOSIS — J9811 Atelectasis: Secondary | ICD-10-CM

## 2015-11-06 LAB — POCT I-STAT, CHEM 8
BUN: 28 mg/dL — ABNORMAL HIGH (ref 6–20)
Calcium, Ion: 1.2 mmol/L (ref 1.13–1.30)
Chloride: 95 mmol/L — ABNORMAL LOW (ref 101–111)
Creatinine, Ser: 1.1 mg/dL (ref 0.61–1.24)
Glucose, Bld: 567 mg/dL (ref 65–99)
HCT: 31 % — ABNORMAL LOW (ref 39.0–52.0)
Hemoglobin: 10.5 g/dL — ABNORMAL LOW (ref 13.0–17.0)
Potassium: 5.1 mmol/L (ref 3.5–5.1)
Sodium: 134 mmol/L — ABNORMAL LOW (ref 135–145)
TCO2: 29 mmol/L (ref 0–100)

## 2015-11-06 LAB — CBC
HCT: 28.9 % — ABNORMAL LOW (ref 39.0–52.0)
Hemoglobin: 9.1 g/dL — ABNORMAL LOW (ref 13.0–17.0)
MCH: 29.4 pg (ref 26.0–34.0)
MCHC: 31.5 g/dL (ref 30.0–36.0)
MCV: 93.2 fL (ref 78.0–100.0)
Platelets: 258 10*3/uL (ref 150–400)
RBC: 3.1 MIL/uL — ABNORMAL LOW (ref 4.22–5.81)
RDW: 15 % (ref 11.5–15.5)
WBC: 5.3 10*3/uL (ref 4.0–10.5)

## 2015-11-06 LAB — GLUCOSE, CAPILLARY
Glucose-Capillary: 116 mg/dL — ABNORMAL HIGH (ref 65–99)
Glucose-Capillary: 135 mg/dL — ABNORMAL HIGH (ref 65–99)
Glucose-Capillary: 136 mg/dL — ABNORMAL HIGH (ref 65–99)
Glucose-Capillary: 161 mg/dL — ABNORMAL HIGH (ref 65–99)
Glucose-Capillary: 171 mg/dL — ABNORMAL HIGH (ref 65–99)
Glucose-Capillary: 226 mg/dL — ABNORMAL HIGH (ref 65–99)

## 2015-11-06 MED ORDER — QUETIAPINE FUMARATE 200 MG PO TABS
200.0000 mg | ORAL_TABLET | Freq: Every day | ORAL | Status: DC
  Administered 2015-11-06: 200 mg via ORAL
  Filled 2015-11-06: qty 1

## 2015-11-06 NOTE — Progress Notes (Signed)
   11/06/15 1559  Clinical Encounter Type  Visited With Patient  Visit Type Initial  Referral From Nurse  Spiritual Encounters  Spiritual Needs Emotional  Stress Factors  Patient Stress Factors Other (Comment) (confinement in hospital)  At request of nurse, checked on patient. He indicated that being in the hospital so long and the slowness of things was causing him stress. He also said that [facility] and that God has been helping him through all of this. Tareva Leske, Chaplain

## 2015-11-06 NOTE — Progress Notes (Signed)
Name: Tyler DeutscherDwight E Daubenspeck MRN: [id number] DOB: 12-04-1945    ADMISSION DATE:  10/16/2015 CONSULTATION DATE:  10/18/15  REFERRING MD : Donata ClayVan Trigt, MD  CHIEF COMPLAINT:  Hypercapnia  DISCUSSION: 70 y/o male with obesity hypoventilation syndrome who underwent a redo CABG and CEA on 4/28. Remains on vent post-operatively - anxiety/encephalopathy, bradycardia/hypotension as barriers -S/p trach 5/5. ABX started on 5/6. Tolerating ATC trials.   SUBJECTIVE:    Up in chair talking with PMV.  No CP, dyspnea Afebrile Off vent since Thursday > on TC 24/7 since then  VITAL SIGNS: Temp:  [98.1 F (36.7 C)-99.1 F (37.3 C)] 99.1 F (37.3 C) (05/15 0753) Pulse Rate:  [84-98] 93 (05/15 0753) Resp:  [16-27] 22 (05/15 0753) BP: (87-146)/(46-73) 114/60 mmHg (05/15 0753) SpO2:  [92 %-97 %] 96 % (05/15 0753) FiO2 (%):  [60 %] 60 % (05/15 0753) Weight:  [127 kg (279 lb 15.8 oz)] 127 kg (279 lb 15.8 oz) (05/15 0500)  Intake/Output Summary (Last 24 hours) at 11/06/15 1007 Last data filed at 11/06/15 0700  Gross per 24 hour  Intake   1874 ml  Output   1625 ml  Net    249 ml   PHYSICAL EXAMINATION: General:  Obese male; alert and comfortablly laying and bed and watching TV HENT: NCAT; trach unremarkable  PULM: resps even non labored on ATC and PMV, dec BS on bibasilar lungs CV: RRR, no clear murmur GI: BS+, soft, distended MSK: normal bulk and tone, moves all extremities Neuro: alert, interactive, speech ok,, follows commands  Recent Labs Lab 11/03/15 0416  11/03/15 1840 11/04/15 0428 11/05/15 0423  NA 138  < > 140 138 139  K 4.5  < > 4.0 4.1 4.3  CL 102  < > 99* 101 100*  CO2 26  --   --  27 28  BUN 25*  < > 32* 26* 34*  CREATININE 1.01  < > 1.00 0.97 1.08  GLUCOSE 194*  < > 184* 175* 196*  < > = values in this interval not displayed.  Recent Labs Lab 11/04/15 0428 11/05/15 0423 11/06/15 0447  HGB 9.9* 9.3* 9.1*  HCT 31.7* 30.7* 28.9*  WBC 6.4 5.9 5.3  PLT 308 285 258   Dg  Chest Port 1 View  11/05/2015  CLINICAL DATA:  Post CABG EXAM: PORTABLE CHEST 1 VIEW COMPARISON:  11/04/2015 FINDINGS: Cardiomegaly again noted. Status post CABG. Stable tracheostomy tube position. Central vascular congestion without pulmonary edema. Stable small left pleural effusion and left basilar atelectasis or infiltrate. There is no pneumothorax. Stable NG tube position. IMPRESSION: Status post CABG. No pulmonary edema. Persistent small left pleural effusion left basilar atelectasis or infiltrate. Electronically Signed   By: Natasha MeadLiviu  Pop M.D.   On: 11/05/2015 10:04    STUDIES:  PFT 4/25 >> restrictive lung dz, low DLCO diffusion LE Doppler 5/2 >>  negative  CULTURES: Sputum 5/5 >>few yeast consistent with candida species Sputum 5/6>>>serratia>>> RES cefazolin, otherwise sens  BC 5/6>>> Urine 5/6>>>GNR>>>Serratia>>>RES cefazolin and nitrofurantoin   ANTIBIOTICS: Cefuroxime 4/28 >> 4/30 vanc 5/6>>off  Zosyn 5/6>>>5/11 Ceftriaxone 5/11>>>  SIGNIFICANT EVENTS: 4/28 CABG and CEA 5/01 Bradycardia / hypotension with any weaning of sedation 5/02 Episodes of desaturation, increased O2 needs 5/03 Weaned off dopamine, neo, sedation reduced 5/5 trach.  5/6 fevers, still on fio2 60%/peep 8. ABX started; lasix cut back to daily  5/7 Creatinine up. Lasix stopped. Weaning on PS 10/peep 8, fent gtt stopped and changed to PRN  LINES/TUBES:  4/28  midine R arm >>5/1  4/28  L subclavian introducer >  4/28  R neck drain >> 4/30 4/28  mediastinal chest tubes >> 4/30 5/01  PICC >> Trach 5/5 (JY)>>>     ASSESSMENT / PLAN:  PULMONARY A:  Chronic hypoxemic respiratory failure with hypercapnea > improving. On on trache collar since thursday Obesity hypoventilation syndrome OSA, non compliant Tracheostomy status. S/p trach 5/5 Serratia PNA P:   TC as tolerated. No capping of trach at night given OSA history -hopefully he will not need noct vent Mobilize as able - PT involved. No plans  for decannulation at this point, change to a cuffless 4 on Monday/Tuesday.     NEUROLOGIC A:   Acute encephalopathy > improving slowly Baseline mental illness, poorly understood, multiple medications at home Chronic pain  MS improved as of 5/7. Wonder if fever +/- some level of infection was contributing factor P:   PRN fentanyl  Continue cymbalta, buspar, seroquesl Mobilize  CARDIOVASCULAR A:  S/p CABG and (R) CEA P:  Per vascular and TCTS Monitor hemodynamics Tele  RENAL A:   AKI - initially improving then Scr rising 5/7.  Improved 5/8 off diuresis.  P:   Monitor BMET and UOP Replace electrolytes as needed   On lasix 20 IV BID > consider d/c in am.  GASTROINTESTINAL A:   Morbid Obesity  P:   Cortrak placed 5/1, continue TF PPI. Reglan scheduled. Can dc TPN in my opinon,  ordered by CVTS. Swallow eval per Speech   HEMATOLOGIC A:   Anemia, no bleeding Thrombocytopenia - resolved  P:  Transfusion per usual ICU guidelines. Lovenox. CBC in AM.  INFECTIOUS A:   Recurrent fever 5/6, normal WBC;  No infiltrate on CXR 5/7 Serratia UTI  Serratia PNA P:   Narrow abx to ceftriaxone 5/11  ENDOCRINE A:   Hyperglycemia P:   Continue home liraglutide, SSI, levemir  FAMILY  - Updates: pt updated at bedside   J. Alexis Frock, MD 11/06/2015, 10:19 AM [facility] Pager [phone] After 3 pm or if no answer, call [phone]     11/06/2015

## 2015-11-06 NOTE — Progress Notes (Signed)
17 Days Post-Op Procedure(s) (LRB): REDO CORONARY ARTERY BYPASS GRAFTING (CABG), TIMES THREE, USING LEFT GREATER SAPHENOUS VEIN HARVESTED ENDOSCOPICALLY AND CRYO VEIN, WITH CORONARY ENDARTERECTOMY (N/A) TRANSESOPHAGEAL ECHOCARDIOGRAM (TEE) (N/A) ENDARTERECTOMY CAROTID (Right) Subjective: Patient remains on ventilator 36 hours but still is trach dependent Patient name bleeding short distances with PT Feeding tube resumed via core track SLT evaluation-swallow study pending Consult placed for CIR evaluation  Objective: Vital signs in last 24 hours: Temp:  [98.1 F (36.7 C)-99.1 F (37.3 C)] 98.6 F (37 C) (05/15 1200) Pulse Rate:  [84-99] 93 (05/15 1400) Cardiac Rhythm:  [-] Normal sinus rhythm (05/15 1300) Resp:  [16-27] 20 (05/15 1400) BP: (87-156)/(46-78) 153/71 mmHg (05/15 1400) SpO2:  [90 %-98 %] 90 % (05/15 1400) FiO2 (%):  [35 %-60 %] 35 % (05/15 1326) Weight:  [279 lb 15.8 oz (127 kg)] 279 lb 15.8 oz (127 kg) (05/15 0500)  Hemodynamic parameters for last 24 hours:  stable  Intake/Output from previous day: 05/14 0701 - 05/15 0700 In: 2134.1 [I.V.:614.1; NG/GT:1470; IV Piggyback:50] Out: 1800 [Urine:1800] Intake/Output this shift: Total I/O In: 786.9 [I.V.:186.9; NG/GT:550; IV Piggyback:50] Out: 350 [Urine:350]       Exam    General- alert and comfortable   Lungs- clear without rales, wheezes   Cor- regular rate and rhythm, no murmur , gallop   Abdomen- soft, non-tender   Extremities - warm, non-tender, minimal edema   Neuro- oriented, appropriate, no focal weakness   Lab Results:  Recent Labs  11/05/15 0423 11/06/15 0447  WBC 5.9 5.3  HGB 9.3* 9.1*  HCT 30.7* 28.9*  PLT 285 258   BMET:  Recent Labs  11/04/15 0428 11/05/15 0423  NA 138 139  K 4.1 4.3  CL 101 100*  CO2 27 28  GLUCOSE 175* 196*  BUN 26* 34*  CREATININE 0.97 1.08  CALCIUM 8.6* 8.5*    PT/INR: No results for input(s): LABPROT, INR in the last 72 hours. ABG    Component Value  Date/Time   PHART 7.305* 10/28/2015 0527   HCO3 34.6* 10/28/2015 0527   TCO2 31 11/03/2015 1840   ACIDBASEDEF 1.0 10/20/2015 1824   O2SAT 67.0 10/31/2015 0345   CBG (last 3)   Recent Labs  11/06/15 0403 11/06/15 0817 11/06/15 1215  GLUCAP 161* 171* 226*    Assessment/Plan: S/P Procedure(s) (LRB): REDO CORONARY ARTERY BYPASS GRAFTING (CABG), TIMES THREE, USING LEFT GREATER SAPHENOUS VEIN HARVESTED ENDOSCOPICALLY AND CRYO VEIN, WITH CORONARY ENDARTERECTOMY (N/A) TRANSESOPHAGEAL ECHOCARDIOGRAM (TEE) (N/A) ENDARTERECTOMY CAROTID (Right) Continue tube feeds pending swallow study Continue tracheostomy for airway toilet CIR evaluation   LOS: 21 days    Kathlee Nationseter Van Trigt III 11/06/2015

## 2015-11-06 NOTE — Care Management Note (Signed)
Case Management Note  Patient Details  Name: Tyler Le MRN: [id number] Date of Birth: June 22, 1946  Subjective/Objective:  Pt admitted for Unstable Angina. Plan for CABG on 10-20-15. Pt is from home with wife. Pt has no DME needs at this time.                 Action/Plan: 11/06/2015 Pt is from home with wife.  Pt remains ventilated on sedation and pressors.  CM will continue to monitor for disposition needs  CM will continue to monitor for disposition needs.   Expected Discharge Date:                  Expected Discharge Plan:  Long Term Acute Care (LTAC)  In-House Referral:  NA, Clinical Social Work  Discharge planning Services  CM Consult  Post Acute Care Choice:    Choice offered to:     DME Arranged:    DME Agency:     HH Arranged:    [place] Agency:     Status of Service:  In process, will continue to follow  Medicare Important Message Given:  Yes Date Medicare IM Given:    Medicare IM give by:    Date Additional Medicare IM Given:    Additional Medicare Important Message give by:     If discussed at [place] of Stay Meetings, dates discussed:    Additional Comments:   11/06/2015  [employer] denied peer to peer.  CIR recommended - CSW consulted as back up plan  11/03/15 [employer] denied LTACH coverage.  Attending agreed to perform peer to peer with [facility].  Pt is on trach collar 60-80%.  TPN, IV antibiotics.  CM will continue to follow for disposition needs   10/31/15 CM met with wife and son and explained the recommended discharge plan of LTACH.  Both wife and son are in agreement with [facility] as discharge disposition.  [facility] liaison met with family and [facility] has been chosen- insurance auth is pending  10/30/15 CM received LTACH referral.  CM gave referral to both [facility] and [facility].  CM contact pts wife [name] via phone to inform of discharge recommendation and provided geographical information regarding the two options in the local area.  Pts wife will contact son and  follow back up with CM.   10/27/15 Pt trached today 10/27/15.  CM had very detailed discussion with wife regarding discharge options should pt not be able to wean off vent/trach prior to discharge including;  Home with Home Health (requirements explained in detail), LTAC, and SNF.  CM explained that discharge planning begins early so that barriers can be eliminated prior to possible delays in progression of care.  CM will continue to follow pt and family and assist with discharge needs.  CSW consulted on tentative consult.   Maryclare Labrador, RN 11/06/2015, 11:19 AM

## 2015-11-06 NOTE — Progress Notes (Signed)
Speech Pathology Note:  Note plan for swallow evaluation today per MD note. SLP does not have orders for swallow evaluation at this time. Called Dr. Zenaida NieceVan Trigt's office this morning to request, as he is currently in surgery. Will continue to follow and proceed with swallow evaluation when orders are received. Recommend MBS versus FEES to determine readiness for PO diet. Thank you!   Maxcine HamLaura Paiewonsky, M.A. CCC-SLP [phone]

## 2015-11-06 NOTE — Progress Notes (Signed)
Speech Language Pathology Treatment: Tyler Le Speaking valve  Patient Details Name: Tyler Le MRN: [id number] DOB: 12-16-1945 Today's Date: 11/06/2015 Time: [id number] SLP Time Calculation (min) (ACUTE ONLY): 12 min  Assessment / Plan / Recommendation Clinical Impression  Pt with PMV in place upon SLP arrival, with increased vocal intensity from #6 cuffless trach. He needs Min-Mod cues to recall instructions for care and use of speaking valve, although his wife recalls information to assist him. Orders received for swallowing evaluation this afternoon - will plan for instrumental testing on next date.   HPI HPI: pt presents with Cardiac Arrest and Redo of CABG x3. pt intubated 4/24 and with Trach on 5/5. pt with hx of CABG, Anxiety, HTN, DM, CAD, Renal Insufficiency, and Back Surgeries.       SLP Plan  MBS     Recommendations         Patient may use Passy-Le Speech Valve: During all waking hours (remove during sleep) PMSV Supervision: Intermittent MD: Please consider changing trach tube to : Smaller size      Follow up Recommendations: Inpatient Rehab Plan: MBS     GO               Tyler HamLaura Le, M.A. CCC-SLP [phone]  Tyler Hamaiewonsky, Tyler Le 11/06/2015, 4:27 PM

## 2015-11-06 NOTE — Progress Notes (Signed)
Physical Therapy Treatment Patient Details Name: Tyler Le MRN: [id number] DOB: Oct 01, 1945 Today's Date: 11/06/2015    History of Present Illness pt presents with Cardiac Arrest and Redo of CABG x3.  pt intubated 4/24 and with Trach on 5/5.  pt with hx of CABG, Anxiety, HTN, DM, CAD, Renal Insufficiency, and Back Surgeries.      PT Comments    Pt mobility much improved as compared to when this PT saw him last.  Pt was able to ambulate in the hallway on trach collar 10L 45% with sats remaining 88-92% while wearing his PMSV.  Feel if pt continues to make this much progress he may be appropriate for CIR level of therapies at D/C.  Will continue to follow.    Follow Up Recommendations  CIR     Equipment Recommendations  None recommended by PT    Recommendations for Other Services Rehab consult     Precautions / Restrictions Precautions Precautions: Fall;Sternal Precaution Comments: Trach collar, Watch O2 sats Restrictions Weight Bearing Restrictions: No    Mobility  Bed Mobility Overal bed mobility: Needs Assistance;+ 2 for safety/equipment Bed Mobility: Rolling;Sidelying to Sit Rolling: Min assist Sidelying to sit: Min assist;+2 for safety/equipment       General bed mobility comments: pt needs MinA to bring trunk up to sitting and max cueing for technique and following sternal precautions.  2nd person helpful for line management.  Transfers Overall transfer level: Needs assistance Equipment used: Pushed w/c Transfers: Sit to/from Stand Sit to Stand: Min assist         General transfer comment: cues for UEs to knees and using momentum to come to standing.    Ambulation/Gait Ambulation/Gait assistance: Min assist;+2 safety/equipment Ambulation Distance (Feet): 80 Feet (and 50) Assistive device:  (pushed W/C) Gait Pattern/deviations: Step-through pattern;Decreased stride length;Trunk flexed     General Gait Details: cues for more upright posture and  encouragement to increase distance.  pt needed a sitting rest break in between bouts of gait.  During first ambulation, pt's O2 sats decreased to 88% on trach collar 10L 45%, but quickly recovers during sitting.  Sats remained in 90's during 2nd ambulation.     Stairs            Wheelchair Mobility    Modified Rankin (Stroke Patients Only)       Balance Overall balance assessment: Needs assistance Sitting-balance support: No upper extremity supported;Feet supported Sitting balance-[name] Scale: Fair     Standing balance support: Bilateral upper extremity supported;During functional activity Standing balance-[name] Scale: Poor                      Cognition Arousal/Alertness: Awake/alert Behavior During Therapy: WFL for tasks assessed/performed Overall Cognitive Status: Impaired/Different from baseline Area of Impairment: Memory;Problem solving     Memory: Decreased short-term memory;Decreased recall of precautions       Problem Solving: Slow processing;Difficulty sequencing;Requires verbal cues;Requires tactile cues      Exercises      General Comments        Pertinent Vitals/Pain Pain Assessment: No/denies pain    Home Living                      Prior Function            PT Goals (current goals can now be found in the care plan section) Acute Rehab PT Goals Patient Stated Goal: Per wife to recover and come home. PT Goal  Formulation: With patient/family Time For Goal Achievement: 11/12/15 Potential to Achieve Goals: Good Progress towards PT goals: Progressing toward goals    Frequency       PT Plan Discharge plan needs to be updated    Co-evaluation             End of Session Equipment Utilized During Treatment: Gait belt;Oxygen Activity Tolerance: Patient tolerated treatment well Patient left: in chair;with call bell/phone within reach;with chair alarm set     Time: 1610-9604 PT Time Calculation (min) (ACUTE ONLY):  27 min  Charges:  $Gait Training: 8-22 mins                    G CodesSunny Schlein, [place] [phone] 11/06/2015, 10:48 AM

## 2015-11-06 NOTE — Progress Notes (Signed)
Occupational Therapy Treatment Patient Details Name: Tyler DeutscherDwight E Poblete MRN: [id number] DOB: 18-Aug-1945 Today's Date: 11/06/2015    History of present illness pt presents with Cardiac Arrest and Redo of CABG x3.  pt intubated 4/24 and with Trach on 5/5.  pt with hx of CABG, Anxiety, HTN, DM, CAD, Renal Insufficiency, and Back Surgeries.     OT comments  Pt requiring minimal physical assistance for mobility, second person for multiple lines and safety. Pt on PSMV this visit. Demonstrating difficulty with sequencing and recalling sternal precautions throughout session. 02 sats maintained at 88% or greater with exertion and 10L 45%. Pt much less anxious about mobilizing this visit. Continue to recommend inpatient rehab to progress pt for return home with his supportive wife.  Follow Up Recommendations  CIR    Equipment Recommendations  None recommended by OT    Recommendations for Other Services      Precautions / Restrictions Precautions Precautions: Fall;Sternal Precaution Comments: Trach collar, Watch O2 sats Restrictions Weight Bearing Restrictions: No Other Position/Activity Restrictions: sternal precautions       Mobility Bed Mobility Overal bed mobility: Needs Assistance;+ 2 for safety/equipment Bed Mobility: Rolling;Sidelying to Sit Rolling: Min assist Sidelying to sit: Min assist;+2 for safety/equipment       General bed mobility comments: assist to raise trunk, verbal cues for sternal precautions and sequencing  Transfers Overall transfer level: Needs assistance Equipment used: Pushed w/c Transfers: Sit to/from Stand Sit to Stand: Min assist;+2 safety/equipment         General transfer comment: cues for UEs to knees and using momentum to come to standing, min assist to rise and steady      Balance Overall balance assessment: Needs assistance Sitting-balance support: No upper extremity supported;Feet supported Sitting balance-[name] Scale: Fair     Standing  balance support: Bilateral upper extremity supported;During functional activity Standing balance-[name] Scale: Poor                     ADL Overall ADL's : Needs assistance/impaired                 Upper Body Dressing : Minimal assistance;Sitting       Toilet Transfer: +2 for safety/equipment;Minimal assistance;Ambulation           Functional mobility during ADLs: +2 for safety/equipment;Minimal assistance;Cueing for safety;Cueing for sequencing (pushed w/c) General ADL Comments: Pt with difficulty recalling sternal precautions.      Vision                     Perception     Praxis      Cognition   Behavior During Therapy: [facility] Center[facility]FL for tasks assessed/performed Overall Cognitive Status: Impaired/Different from baseline Area of Impairment: Memory;Problem solving     Memory: Decreased short-term memory;Decreased recall of precautions        Problem Solving: Slow processing;Difficulty sequencing;Requires verbal cues;Requires tactile cues      Extremity/Trunk Assessment               Exercises     Shoulder Instructions       General Comments      Pertinent Vitals/ Pain       Pain Assessment: No/denies pain  Home Living                                          Prior Functioning/Environment  Frequency Min 2X/week     Progress Toward Goals  OT Goals(current goals can now be found in the care plan section)  Progress towards OT goals: Progressing toward goals  Acute Rehab OT Goals Patient Stated Goal: Per wife to recover and come home. Time For Goal Achievement: 11/17/15 Potential to Achieve Goals: Good  Plan Discharge plan remains appropriate    Co-evaluation                 End of Session Equipment Utilized During Treatment: Oxygen   Activity Tolerance Patient tolerated treatment well   Patient Left in chair;with family/visitor present;with call bell/phone within reach   Nurse  Communication Mobility status        Time: 8295-6213 OT Time Calculation (min): 27 min  Charges: OT General Charges $OT Visit: 1 Procedure OT Treatments $Therapeutic Activity: 8-22 mins  Evern Bio 11/06/2015, 11:14 AM  [phone]

## 2015-11-06 NOTE — Progress Notes (Signed)
CT surgery p.m. Rounds  Patient had excellent day Ambulated around the unit hallway Tracheostomy appliance exchanged to  cuff less #6 Shiley Remains on tube feedings-swallow test tomorrow Patient is able to phonate with p.m. Valve Patient complains of anxiety and wishes more medication this p.m. to help her sleep

## 2015-11-07 ENCOUNTER — Inpatient Hospital Stay (HOSPITAL_COMMUNITY): Payer: Medicare Other

## 2015-11-07 DIAGNOSIS — J181 Lobar pneumonia, unspecified organism: Secondary | ICD-10-CM

## 2015-11-07 DIAGNOSIS — E669 Obesity, unspecified: Secondary | ICD-10-CM | POA: Insufficient documentation

## 2015-11-07 DIAGNOSIS — R5381 Other malaise: Secondary | ICD-10-CM | POA: Insufficient documentation

## 2015-11-07 DIAGNOSIS — Z430 Encounter for attention to tracheostomy: Secondary | ICD-10-CM

## 2015-11-07 DIAGNOSIS — K567 Ileus, unspecified: Secondary | ICD-10-CM | POA: Insufficient documentation

## 2015-11-07 DIAGNOSIS — R0682 Tachypnea, not elsewhere classified: Secondary | ICD-10-CM | POA: Insufficient documentation

## 2015-11-07 DIAGNOSIS — N179 Acute kidney failure, unspecified: Secondary | ICD-10-CM

## 2015-11-07 DIAGNOSIS — Z951 Presence of aortocoronary bypass graft: Secondary | ICD-10-CM | POA: Insufficient documentation

## 2015-11-07 DIAGNOSIS — M545 Low back pain, unspecified: Secondary | ICD-10-CM | POA: Insufficient documentation

## 2015-11-07 DIAGNOSIS — E119 Type 2 diabetes mellitus without complications: Secondary | ICD-10-CM

## 2015-11-07 DIAGNOSIS — D62 Acute posthemorrhagic anemia: Secondary | ICD-10-CM

## 2015-11-07 DIAGNOSIS — G8929 Other chronic pain: Secondary | ICD-10-CM

## 2015-11-07 DIAGNOSIS — R000 Tachycardia, unspecified: Secondary | ICD-10-CM

## 2015-11-07 DIAGNOSIS — R1310 Dysphagia, unspecified: Secondary | ICD-10-CM | POA: Insufficient documentation

## 2015-11-07 DIAGNOSIS — G4733 Obstructive sleep apnea (adult) (pediatric): Secondary | ICD-10-CM | POA: Insufficient documentation

## 2015-11-07 DIAGNOSIS — J189 Pneumonia, unspecified organism: Secondary | ICD-10-CM | POA: Insufficient documentation

## 2015-11-07 DIAGNOSIS — F4323 Adjustment disorder with mixed anxiety and depressed mood: Secondary | ICD-10-CM | POA: Insufficient documentation

## 2015-11-07 DIAGNOSIS — I10 Essential (primary) hypertension: Secondary | ICD-10-CM | POA: Insufficient documentation

## 2015-11-07 DIAGNOSIS — Z930 Tracheostomy status: Secondary | ICD-10-CM | POA: Insufficient documentation

## 2015-11-07 DIAGNOSIS — Z419 Encounter for procedure for purposes other than remedying health state, unspecified: Secondary | ICD-10-CM

## 2015-11-07 LAB — GLUCOSE, CAPILLARY
Glucose-Capillary: 102 mg/dL — ABNORMAL HIGH (ref 65–99)
Glucose-Capillary: 131 mg/dL — ABNORMAL HIGH (ref 65–99)
Glucose-Capillary: 140 mg/dL — ABNORMAL HIGH (ref 65–99)
Glucose-Capillary: 155 mg/dL — ABNORMAL HIGH (ref 65–99)
Glucose-Capillary: 176 mg/dL — ABNORMAL HIGH (ref 65–99)
Glucose-Capillary: 203 mg/dL — ABNORMAL HIGH (ref 65–99)

## 2015-11-07 LAB — COMPREHENSIVE METABOLIC PANEL
ALT: 65 U/L — ABNORMAL HIGH (ref 17–63)
AST: 31 U/L (ref 15–41)
Albumin: 2.8 g/dL — ABNORMAL LOW (ref 3.5–5.0)
Alkaline Phosphatase: 106 U/L (ref 38–126)
Anion gap: 10 (ref 5–15)
BUN: 29 mg/dL — ABNORMAL HIGH (ref 6–20)
CO2: 31 mmol/L (ref 22–32)
Calcium: 8.8 mg/dL — ABNORMAL LOW (ref 8.9–10.3)
Chloride: 101 mmol/L (ref 101–111)
Creatinine, Ser: 1.28 mg/dL — ABNORMAL HIGH (ref 0.61–1.24)
GFR calc Af Amer: >60 mL/min (ref >60)
GFR calc non Af Amer: 55 mL/min — ABNORMAL LOW (ref >60)
Glucose, Bld: 118 mg/dL — ABNORMAL HIGH (ref 65–99)
Potassium: 3.9 mmol/L (ref 3.5–5.1)
Sodium: 142 mmol/L (ref 135–145)
Total Bilirubin: 0.6 mg/dL (ref 0.3–1.2)
Total Protein: 6.4 g/dL — ABNORMAL LOW (ref 6.5–8.1)

## 2015-11-07 LAB — CBC
HCT: 29.9 % — ABNORMAL LOW (ref 39.0–52.0)
Hemoglobin: 9.2 g/dL — ABNORMAL LOW (ref 13.0–17.0)
MCH: 28.8 pg (ref 26.0–34.0)
MCHC: 30.8 g/dL (ref 30.0–36.0)
MCV: 93.7 fL (ref 78.0–100.0)
Platelets: 256 10*3/uL (ref 150–400)
RBC: 3.19 MIL/uL — ABNORMAL LOW (ref 4.22–5.81)
RDW: 15.1 % (ref 11.5–15.5)
WBC: 4.6 10*3/uL (ref 4.0–10.5)

## 2015-11-07 MED ORDER — METOPROLOL TARTRATE 25 MG PO TABS
25.0000 mg | ORAL_TABLET | Freq: Two times a day (BID) | ORAL | Status: DC
  Administered 2015-11-07 – 2015-11-08 (×3): 25 mg via ORAL
  Filled 2015-11-07 (×3): qty 1

## 2015-11-07 MED ORDER — IPRATROPIUM BROMIDE 0.02 % IN SOLN
0.5000 mg | Freq: Two times a day (BID) | RESPIRATORY_TRACT | Status: DC
  Administered 2015-11-07 – 2015-11-09 (×3): 0.5 mg via RESPIRATORY_TRACT
  Filled 2015-11-07 (×4): qty 2.5

## 2015-11-07 MED ORDER — LEVALBUTEROL HCL 1.25 MG/0.5ML IN NEBU
1.2500 mg | INHALATION_SOLUTION | Freq: Two times a day (BID) | RESPIRATORY_TRACT | Status: DC
  Administered 2015-11-07 – 2015-11-09 (×3): 1.25 mg via RESPIRATORY_TRACT
  Filled 2015-11-07 (×4): qty 0.5

## 2015-11-07 MED ORDER — QUETIAPINE FUMARATE 25 MG PO TABS
150.0000 mg | ORAL_TABLET | Freq: Every day | ORAL | Status: DC
  Administered 2015-11-07 – 2015-11-13 (×7): 150 mg via ORAL
  Filled 2015-11-07 (×2): qty 6
  Filled 2015-11-07: qty 2
  Filled 2015-11-07 (×4): qty 6

## 2015-11-07 MED ORDER — STARCH (THICKENING) PO POWD
ORAL | Status: DC | PRN
  Filled 2015-11-07: qty 227

## 2015-11-07 MED ORDER — RESOURCE THICKENUP CLEAR PO POWD
ORAL | Status: DC | PRN
  Filled 2015-11-07: qty 125

## 2015-11-07 NOTE — Progress Notes (Signed)
TCTS BRIEF SICU PROGRESS NOTE  18 Days Post-Op  S/P Procedure(s) (LRB): REDO CORONARY ARTERY BYPASS GRAFTING (CABG), TIMES THREE, USING LEFT GREATER SAPHENOUS VEIN HARVESTED ENDOSCOPICALLY AND CRYO VEIN, WITH CORONARY ENDARTERECTOMY (N/A) TRANSESOPHAGEAL ECHOCARDIOGRAM (TEE) (N/A) ENDARTERECTOMY CAROTID (Right)   Stable day  Plan: Continue current plan  Purcell Nailslarence H Shasta Chinn, MD 11/07/2015 7:45 PM

## 2015-11-07 NOTE — Progress Notes (Signed)
MBSS complete. Full report located under chart review in imaging section.  Tyler Le, M.A. CCC-SLP [phone]  

## 2015-11-07 NOTE — Progress Notes (Signed)
Name: Tyler Le MRN: [id number] DOB: [birth year]    ADMISSION DATE:  10/16/2015 CONSULTATION DATE:  10/18/15  REFERRING MD : Donata ClayVan Trigt, MD  CHIEF COMPLAINT:  Hypercapnia  DISCUSSION: [age] y/o male with obesity hypoventilation syndrome who underwent a redo CABG and CEA on 4/28. Remains on vent post-operatively - anxiety/encephalopathy, bradycardia/hypotension as barriers -S/p trach 5/5. ABX started on 5/6. Tolerating ATC trials.   SUBJECTIVE:   Doing well, about to ambulate around unit (did so without problem yesterday).  No complaints at this time.  Remains on 28% ATC and oxygenating well, resp effort normal. Had SLP eval and passed for dysphagia diet per RN.   VITAL SIGNS: Temp:  [98.2 F (36.8 C)-99.1 F (37.3 C)] 98.2 F (36.8 C) (05/16 0759) Pulse Rate:  [68-107] 68 (05/16 1152) Resp:  [13-28] 16 (05/16 1152) BP: (110-161)/(54-108) 121/108 mmHg (05/16 1152) SpO2:  [87 %-100 %] 100 % (05/16 1152) FiO2 (%):  [28 %-40 %] 28 % (05/16 1152) Weight:  [280 lb 8 oz (127.234 kg)] 280 lb 8 oz (127.234 kg) (05/16 0500)  Intake/Output Summary (Last 24 hours) at 11/07/15 1200 Last data filed at 11/07/15 1100  Gross per 24 hour  Intake 1572.47 ml  Output   2550 ml  Net -977.53 ml   PHYSICAL EXAMINATION: General:  Obese male; up and about to ambulate around unit. HENT: NCAT; trach C/D/I, on 28% ATC. PULM: resps even non labored on ATC and PMV, dec BS on bibasilar lungs. CV: RRR, no clear murmur. GI: BS+, soft, distended. MSK: normal bulk and tone, moves all extremities. Neuro: alert, interactive, speech ok,, follows commands.  Recent Labs Lab 11/04/15 0428 11/05/15 0423 11/07/15 0325  NA 138 139 142  K 4.1 4.3 3.9  CL 101 100* 101  CO2 27 28 31   BUN 26* 34* 29*  CREATININE 0.97 1.08 1.28*  GLUCOSE 175* 196* 118*    Recent Labs Lab 11/05/15 0423 11/06/15 0447 11/07/15 0325  HGB 9.3* 9.1* 9.2*  HCT 30.7* 28.9* 29.9*  WBC 5.9 5.3 4.6  PLT 285 258 256   Dg  Chest Port 1 View  11/07/2015  CLINICAL DATA:  Redo CABG EXAM: PORTABLE CHEST 1 VIEW FINDINGS: Tubular device is stable. Cardiomegaly stable. Left basilar opacity likely a combination of volume loss and pleural fluid is stable. Volume loss at the right base has improved. IMPRESSION: Improved volume loss at the right base. Stable left basilar opacity is described. Electronically Signed   By: Jolaine ClickArthur  Hoss M.D.   On: 11/07/2015 07:51    STUDIES:  PFT 4/25 >> restrictive lung dz, low DLCO diffusion LE Doppler 5/2 >>  negative  CULTURES: Sputum 5/5 >>few yeast consistent with candida species Sputum 5/6>>>serratia>>> RES cefazolin, otherwise sens  BC 5/6>>> Urine 5/6>>>GNR>>>Serratia>>>RES cefazolin and nitrofurantoin   ANTIBIOTICS: Cefuroxime 4/28 >> 4/30 vanc 5/6>>off  Zosyn 5/6>>>5/11 Ceftriaxone 5/11>>>  SIGNIFICANT EVENTS: 4/28 CABG and CEA 5/01 Bradycardia / hypotension with any weaning of sedation 5/02 Episodes of desaturation, increased O2 needs 5/03 Weaned off dopamine, neo, sedation reduced 5/5 trach.  5/6 fevers, still on fio2 60%/peep 8. ABX started; lasix cut back to daily  5/7 Creatinine up. Lasix stopped. Weaning on PS 10/peep 8, fent gtt stopped and changed to PRN 5/16 Passed swallow eval and cleared for dysphagia diet  LINES/TUBES: 4/28  midine R arm >>5/1  4/28  L subclavian introducer >  4/28  R neck drain >> 4/30 4/28  mediastinal chest tubes >> 4/30 5/01  PICC >>  Trach 5/5 (JY)>>>     ASSESSMENT / PLAN:  PULMONARY A:  Chronic hypoxemic respiratory failure with hypercapnea > improving. On on trach collar since ~5/11 and progressing well. Obesity hypoventilation syndrome. OSA, non compliant. 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 Serratia PNA - s/p abx. P:   TC as tolerated - attempt to wean to room air. No capping of trach at night given OSA history  Mobilize as able - PT involved. No plans for decannulation at this point given that he is  non-compliant with CPAP. We will keep present #6 cuffless.  NEUROLOGIC A:   Acute encephalopathy > improved. Question if fever +/- some level of infection was contributing factor. Baseline mental illness, poorly understood, multiple medications at home. Chronic pain. P:   PRN fentanyl. Continue cymbalta, buspar, seroquel. Mobilize.  CARDIOVASCULAR A:  S/p CABG and (R) CEA. P:  Per vascular and TCTS. Monitor hemodynamics.  RENAL A:   AKI. P:   Monitor BMET and UOP. Replace electrolytes as needed.  GASTROINTESTINAL A:   Morbid Obesity. Nutrition - cleared for dysphagia diet per SLP. P:   PPI. Start diet.  HEMATOLOGIC A:   Anemia, no bleeding. Thrombocytopenia - resolved. P:  Transfusion per usual ICU guidelines. Lovenox. CBC in AM.  INFECTIOUS A:   Serratia UTI - on ceftriaxone, stop date 5/17 for 7 days therapy. Serratia PNA - s/p abx. P:   Continue ceftriaxone through tomorrow 5/17.  ENDOCRINE A:   Hyperglycemia P:   Continue home liraglutide, SSI, levemir.  FAMILY  - Updates: pt updated at bedside   CC time:  35 minutes.   Rutherford Guys, [place] [facility] Pager: [phone]  or [phone] 11/07/2015, 12:09 PM   ATTENDING NOTE / ATTESTATION NOTE :   I have discussed the case with the resident/APP Rahul Desai.  I agree with the resident/APP's  history, physical examination, assessment, and plans.  I have edited the above note and modified it according to our agreed history, physical examination, assessment and plan.   Recommend keeping the trache as much as possible duriing this admission. We can consider decannulating as an outpt. Pt is non compliant on cpap. [place] Nickel will treat his osa and prevent readmission.   Pollie Meyer, MD 11/07/2015, 2:47 PM [facility] Pager [phone] After 3 pm or if no answer, call [phone]

## 2015-11-07 NOTE — Progress Notes (Signed)
Patient out of room for MBS per RN. Will follow up later.

## 2015-11-07 NOTE — Progress Notes (Signed)
18 Days Post-Op Procedure(s) (LRB): REDO CORONARY ARTERY BYPASS GRAFTING (CABG), TIMES THREE, USING LEFT GREATER SAPHENOUS VEIN HARVESTED ENDOSCOPICALLY AND CRYO VEIN, WITH CORONARY ENDARTERECTOMY (N/A) TRANSESOPHAGEAL ECHOCARDIOGRAM (TEE) (N/A) ENDARTERECTOMY CAROTID (Right) Subjective: Patient past swallow test Stage Le diet started, feeding tube removed Patient on tracheostomy with p.m. Valve Consult to inpatient rehabilitation pending Maintaining sinus rhythm-amiodarone has been stopped Plan transfer to step down tomorrow  Objective: Vital signs in last 24 hours: Temp:  [98.2 F (36.8 C)-98.5 F (36.9 C)] 98.2 F (36.8 C) (05/16 1600) Pulse Rate:  [68-112] 98 (05/16 1700) Cardiac Rhythm:  [-] Sinus tachycardia (05/16 1600) Resp:  [13-30] 16 (05/16 1700) BP: (110-168)/(54-108) 125/77 mmHg (05/16 1700) SpO2:  [87 %-100 %] 96 % (05/16 1700) FiO2 (%):  [28 %-40 %] 28 % (05/16 1600) Weight:  [280 lb 8 oz (127.234 kg)] 280 lb 8 oz (127.234 kg) (05/16 0500)  Hemodynamic parameters for last 24 hours:  stable  Intake/Output from previous day: 05/15 0701 - 05/16 0700 In: 2006 [I.V.:326; NG/GT:1630; IV Piggyback:50] Out: 1550 [Urine:1550] Intake/Output this shift: Total I/O In: 440 [P.O.:240; NG/GT:150; IV Piggyback:50] Out: 1650 [Urine:1650]       Exam    General- alert and comfortable   Lungs- clear without rales, wheezes   Cor- regular rate and rhythm, no murmur , gallop   Abdomen- soft, non-tender   Extremities - warm, non-tender, minimal edema   Neuro- oriented, appropriate, no focal weakness   Lab Results:  Recent Labs  11/06/15 0447 11/07/15 0325  WBC 5.3 4.6  HGB 9.1* 9.2*  HCT 28.9* 29.9*  PLT 258 256   BMET:  Recent Labs  11/05/15 0423 11/07/15 0325  NA 139 142  K 4.3 3.9  CL 100* 101  CO2 28 31  GLUCOSE 196* 118*  BUN 34* 29*  CREATININE 1.08 1.28*  CALCIUM 8.5* 8.8*    PT/INR: No results for input(s): LABPROT, INR in the last 72  hours. ABG    Component Value Date/Time   PHART 7.305* 10/28/2015 0527   HCO3 34.6* 10/28/2015 0527   TCO2 31 11/03/2015 1840   ACIDBASEDEF 1.0 10/20/2015 1824   O2SAT 67.0 10/31/2015 0345   CBG (last 3)   Recent Labs  11/07/15 0820 11/07/15 1219 11/07/15 1610  GLUCAP 140* 155* 203*    Assessment/Plan: S/P Procedure(s) (LRB): REDO CORONARY ARTERY BYPASS GRAFTING (CABG), TIMES THREE, USING LEFT GREATER SAPHENOUS VEIN HARVESTED ENDOSCOPICALLY AND CRYO VEIN, WITH CORONARY ENDARTERECTOMY (N/A) TRANSESOPHAGEAL ECHOCARDIOGRAM (TEE) (N/A) ENDARTERECTOMY CAROTID (Right) Remove skin staples her neck-carotid endarterectomy site Remove EP WC tomorrow Start oral Plavix for right coronary endarterectomy at time of redo CABG Transfer to step down tomorrow  LOS: 22 days    Tyler Le 11/07/2015

## 2015-11-07 NOTE — Consult Note (Signed)
Physical Medicine and Rehabilitation Consult   Reason for Consult: Debility in setting of multiple medical issues Referring Physician: Dr. Prescott Gum.    HPI: Tyler Le is a 70 y.o. male with history of HTN, T2DM, depression/anxiety, obesity, recent left shoulder injury, chronic back pain,  CAD 2001 with progressive pain and left shoulder discomfort who was admitted on 10/16/15 for work up of unstable angina with cardiac cath.  He was found to have new LAD disease with recurrent stenosis and CABG recommended once Brillinta washed out. PCCM consulted for input on acute on chronic respiratory acidosis--has refused BIPAP for OSA in the past. He was started on BIPAP with low setting and underwent underwent redo CABG X 3 (Dr. Prescott Gum)  with right carotid endarterectomy (Dr. Kellie Simmering) on 10/20/15. Post op had difficulty with vent wean requiring sedation due to agitation and delirium. Pulmonary edema treated with diuresis and required tracheostomy 05/05. He was started on IV Vanc/Zosyn for fevers due to Serratia Marcescens PNA on 05/6 and tube feeds changed to TNA due to gastric dilatation due to significant ileus. He tolerated to wean to ATC. Antibiotics narrowed to ceftriaxone and NO plans for decannulation per PCCM. He is tolerating PMSV use during the day and to be downsized to CFS #4 this week?  Ileus has resolved and tube feeds resumed, started on a modified diet on 5/16 after MBS. Therapy ongoing with patient showing improvement in tolerance but continues to have poor safety and difficulty with processing as well as anxiety.  CIR recommended for follow up therapy.     Review of Systems  HENT: Negative for hearing loss.   Eyes: Negative for blurred vision and double vision.  Respiratory: Positive for cough. Negative for shortness of breath.   Cardiovascular: Negative for chest pain and palpitations.  Gastrointestinal: Positive for heartburn and constipation.  Genitourinary: Negative for  dysuria.  Musculoskeletal: Positive for myalgias, back pain and joint pain.       BLE occasionally tend to give out with ambulation. Has had ESI recently by Dr. Ernestina Patches.   Neurological: Negative for dizziness, speech change, focal weakness and headaches.  Psychiatric/Behavioral: The patient is nervous/anxious and has insomnia.   All other systems reviewed and are negative.     Past Medical History  Diagnosis Date  . Essential hypertension   . Dyslipidemia   . Coronary artery disease     a. 2001: s/p CABG X4;  b. 08/2010 s/p PCI/BMS to VG->RI->OM;  c. 06/2014 Cath/PCI: LM nl, LAD 144m LCX 730mRI 100, OM1 100, OM2 80, small, RCA 100ost, VG->RCA 100, VG->Diag 100, VG->RI->OM1 70-80 ISR (4.0x24 Promus DES), LIMA->LAD nl.  . Morbid obesity ([place]  . Dysrhythmia   . History of blood transfusion 2001    "related to OHS"  . Anemia   . GERD (gastroesophageal reflux disease)   . Depression   . Anxiety   . Nephrolithiasis   . Complication of anesthesia     "they have a hard time waking him up"  . Type II diabetes mellitus ([place]  . Arthritis     "back, neck" (07/11/2014)  . Chronic lower back pain   . Thrombocytopenia ([facility]    a. Borderline low platelets by labs noted in 06/2014 but also noted on prior labs as well.  . Renal insufficiency     a. noted 06/2014 - baseline unclear as no data since 2012.  . Marland KitchenBBB     Past Surgical History  Procedure Laterality Date  . Cardiac catheterization  2001; ~ 2010  . Coronary artery bypass graft  2001    "CABG X 5"  . Appendectomy  ~ 1955  . Lumbar laminectomy  01/2011  . Back surgery    . Cholecystectomy    . Shoulder arthroscopy w/ rotator cuff repair Left 1990's  . Cardiovascular stress test  07/03/2009    EF 64%  . Left heart catheterization with coronary/graft angiogram N/A 05/09/2011    Procedure: LEFT HEART CATHETERIZATION WITH Beatrix Fetters;  Surgeon: Burnell Blanks, MD;  Location: [facility] CATH LAB;  Service: Cardiovascular;   Laterality: N/A;  . Tonsillectomy and adenoidectomy  1951  . Left heart catheterization with coronary angiogram N/A 07/11/2014    Procedure: LEFT HEART CATHETERIZATION WITH CORONARY ANGIOGRAM;  Surgeon: Peter M Martinique, MD;  Location: [facility] CATH LAB;  Service: Cardiovascular;  Laterality: N/A;  . Coronary angioplasty with stent placement  March 2012    BMS to SVG to intermediate/OM  . Coronary angioplasty with stent placement  07/11/2014    "1"  . Cardiac catheterization N/A 10/16/2015    Procedure: Left Heart Cath and Cors/Grafts Angiography;  Surgeon: Jettie Booze, MD;  Location: [place] CV LAB;  Service: Cardiovascular;  Laterality: N/A;  . Coronary artery bypass graft N/A 10/20/2015    Procedure: REDO CORONARY ARTERY BYPASS GRAFTING (CABG), TIMES THREE, USING LEFT GREATER SAPHENOUS VEIN HARVESTED ENDOSCOPICALLY AND CRYO VEIN, WITH CORONARY ENDARTERECTOMY;  Surgeon: Ivin Poot, MD;  Location: [place];  Service: Open Heart Surgery;  Laterality: N/A;  SVG to LAD, SVG to OM, CRYOVEIN to RCA with Endarterectomy  . Tee without cardioversion N/A 10/20/2015    Procedure: TRANSESOPHAGEAL ECHOCARDIOGRAM (TEE);  Surgeon: Ivin Poot, MD;  Location: [place];  Service: Open Heart Surgery;  Laterality: N/A;  . Endarterectomy Right 10/20/2015    Procedure: ENDARTERECTOMY CAROTID;  Surgeon: Mal Misty, MD;  Location: [place];  Service: Vascular;  Laterality: Right;    Family History  Problem Relation Age of Onset  . Heart disease Mother   . Heart disease Father   . Heart attack Father   . Heart disease Brother     Social History:  Married.  Retired. Independent PTA. He  reports that he has quit smoking. His smoking use included Cigarettes. He has a 100 pack-year smoking history. He has quit using smokeless tobacco. His smokeless tobacco use included Chew. He reports that he drinks alcohol. He reports that he does not use illicit drugs.   Allergies  Allergen Reactions  . Metformin And  Related Nausea And Vomiting  . Pravachol Nausea And Vomiting  . Ramipril Nausea Only  . Wellbutrin [Bupropion Hcl] Nausea And Vomiting    Medications Prior to Admission  Medication Sig Dispense Refill  . amLODipine (NORVASC) 5 MG tablet Take 1 tablet (5 mg total) by mouth daily. 90 tablet 3  . Artificial Tear Ointment (DRY EYES OP) Place 1 drop into both eyes daily as needed (for dry eyes).    . Blood Glucose Monitoring Suppl (FREESTYLE FREEDOM) KIT     . BRILINTA 90 MG TABS tablet TAKE 1 TABLET BY MOUTH 2 TIMES DAILY. (Patient taking differently: TAKE 90 mg (1 TABLET) BY MOUTH 2 TIMES DAILY.) 60 tablet 9  . busPIRone (BUSPAR) 15 MG tablet Take 15 mg by mouth daily.    . carvedilol (COREG) 25 MG tablet Take 0.5 tablets (12.5 mg total) by mouth 2 (two) times daily with a meal. 60  tablet 6  . Cholecalciferol (VITAMIN D3) 2000 UNITS TABS Take 4,000 Units by mouth daily.     . CYMBALTA 60 MG capsule Take 60 mg by mouth daily.     Marland Kitchen HYDROcodone-acetaminophen (NORCO) 10-325 MG per tablet Take 1-2 tablets by mouth every 6 (six) hours as needed. For pain    . insulin glargine (LANTUS) 100 UNIT/ML injection Inject 64 Units into the skin at bedtime.     . isosorbide mononitrate (IMDUR) 60 MG 24 hr tablet Take 1 tablet (60 mg total) by mouth daily. 30 tablet 11  . LINZESS 145 MCG CAPS capsule Take 145 mcg by mouth daily.    Marland Kitchen lisinopril (PRINIVIL,ZESTRIL) 5 MG tablet Take 5 mg by mouth daily.    . mirtazapine (REMERON) 15 MG tablet Take 15 mg by mouth at bedtime.     . nitroGLYCERIN (NITROSTAT) 0.4 MG SL tablet Place 1 tablet (0.4 mg total) under the tongue every 5 (five) minutes as needed for chest pain (up to 3 doses). 100 tablet 4  . NOVOLOG FLEXPEN 100 UNIT/ML FlexPen Inject 40-50 Units into the skin 2 (two) times daily. Use 50 units in the morning and use 40 units in the evening    . QUEtiapine (SEROQUEL) 400 MG tablet Take 400 mg by mouth at bedtime.      Home: Home Living Family/patient  expects to be discharged to:: Unsure Living Arrangements: Spouse/significant other  Functional History: Prior Function Level of Independence: Needs assistance ADL's / Homemaking Assistance Needed: assisted with LB ADL and all IADL Functional Status:  Mobility: Bed Mobility Overal bed mobility: Needs Assistance, + 2 for safety/equipment Bed Mobility: Rolling, Sidelying to Sit Rolling: Min assist Sidelying to sit: Min assist, +2 for safety/equipment Supine to sit: +2 for physical assistance, Mod assist Sit to supine: Max assist, +2 for physical assistance Sit to sidelying: Max assist, +2 for physical assistance General bed mobility comments: assist to raise trunk, verbal cues for sternal precautions and sequencing Transfers Overall transfer level: Needs assistance Equipment used: Pushed w/c Transfers: Sit to/from Stand Sit to Stand: Min assist, +2 safety/equipment Stand pivot transfers: +2 physical assistance, Min assist General transfer comment: cues for UEs to knees and using momentum to come to standing, min assist to rise and steady   Ambulation/Gait Ambulation/Gait assistance: Min assist, +2 safety/equipment Ambulation Distance (Feet): 80 Feet (and 50) Assistive device:  (pushed W/C) Gait Pattern/deviations: Step-through pattern, Decreased stride length, Trunk flexed General Gait Details: cues for more upright posture and encouragement to increase distance.  pt needed a sitting rest break in between bouts of gait.  During first ambulation, pt's O2 sats decreased to 88% on trach collar 10L 45%, but quickly recovers during sitting.  Sats remained in 90's during 2nd ambulation.      ADL: ADL Overall ADL's : Needs assistance/impaired Eating/Feeding: NPO Grooming: Wash/dry hands, Wash/dry face, Set up, Sitting Upper Body Bathing: Minimal assitance, Sitting Lower Body Bathing: Total assistance, Sit to/from stand Upper Body Dressing : Minimal assistance, Sitting Lower Body  Dressing: Total assistance, Sit to/from stand Toilet Transfer: +2 for safety/equipment, Minimal assistance, Ambulation Toilet Transfer Details (indicate cue type and reason): simulated to recliner Toileting- Clothing Manipulation and Hygiene: Total assistance, Sit to/from stand Functional mobility during ADLs: +2 for safety/equipment, Minimal assistance, Cueing for safety, Cueing for sequencing (pushed w/c) General ADL Comments: Pt with difficulty recalling sternal precautions.  Cognition: Cognition Overall Cognitive Status: Impaired/Different from baseline Orientation Level: Oriented X4 Cognition Arousal/Alertness: Awake/alert Behavior During Therapy: [facility]  for tasks assessed/performed Overall Cognitive Status: Impaired/Different from baseline Area of Impairment: Memory, Problem solving Memory: Decreased short-term memory, Decreased recall of precautions Problem Solving: Slow processing, Difficulty sequencing, Requires verbal cues, Requires tactile cues Difficult to assess due to: Tracheostomy   Blood pressure 116/61, pulse 92, temperature 98.2 F (36.8 C), temperature source Oral, resp. rate 17, height '6\' 4"'  (1.93 m), weight 127.234 kg (280 lb 8 oz), SpO2 93 %. Physical Exam  Nursing note and vitals reviewed. Constitutional: He is oriented to person, place, and time. He appears well-developed and well-nourished.  HENT:  Head: Normocephalic and atraumatic.  Eyes: Conjunctivae are normal. Pupils are equal, round, and reactive to light.  Neck: Normal range of motion and phonation normal.  CFS # 6 in place with ATC.   Cardiovascular: An irregular rhythm present. Tachycardia present.   Respiratory: Effort normal and breath sounds normal. No stridor. No respiratory distress. He has no wheezes.  GI: He exhibits distension. Bowel sounds are decreased. There is no tenderness.  +NG  Musculoskeletal: He exhibits edema. He exhibits no tenderness.  Neurological: He is alert and oriented to  person, place, and time.  PMSV in place with strong voice.  Able to follow commands Motor: 4+-5 throughout Sensation intact to light touch DTRs symmetric   Skin: Skin is warm and dry.  Staples right neck clean and ry.   Psychiatric: He has a normal mood and affect. His behavior is normal. Thought content normal.  Very impatient at baseline per wife.     Results for orders placed or performed during the hospital encounter of 10/16/15 (from the past 24 hour(s))  Glucose, capillary     Status: Abnormal   Collection Time: 11/06/15 12:15 PM  Result Value Ref Range   Glucose-Capillary 226 (H) 65 - 99 mg/dL   Comment 1 Notify RN   Glucose, capillary     Status: Abnormal   Collection Time: 11/06/15  4:08 PM  Result Value Ref Range   Glucose-Capillary 136 (H) 65 - 99 mg/dL   Comment 1 Notify RN   Glucose, capillary     Status: Abnormal   Collection Time: 11/06/15  7:39 PM  Result Value Ref Range   Glucose-Capillary 116 (H) 65 - 99 mg/dL   Comment 1 Capillary Specimen    Comment 2 Notify RN    Comment 3 Document in Chart   Glucose, capillary     Status: Abnormal   Collection Time: 11/06/15 11:51 PM  Result Value Ref Range   Glucose-Capillary 131 (H) 65 - 99 mg/dL   Comment 1 Capillary Specimen    Comment 2 Notify RN    Comment 3 Document in Chart   CBC     Status: Abnormal   Collection Time: 11/07/15  3:25 AM  Result Value Ref Range   WBC 4.6 4.0 - 10.5 K/uL   RBC 3.19 (L) 4.22 - 5.81 MIL/uL   Hemoglobin 9.2 (L) 13.0 - 17.0 g/dL   HCT 29.9 (L) 39.0 - 52.0 %   MCV 93.7 78.0 - 100.0 fL   MCH 28.8 26.0 - 34.0 pg   MCHC 30.8 30.0 - 36.0 g/dL   RDW 15.1 11.5 - 15.5 %   Platelets 256 150 - 400 K/uL  Comprehensive metabolic panel     Status: Abnormal   Collection Time: 11/07/15  3:25 AM  Result Value Ref Range   Sodium 142 135 - 145 mmol/L   Potassium 3.9 3.5 - 5.1 mmol/L   Chloride 101 101 -  111 mmol/L   CO2 31 22 - 32 mmol/L   Glucose, Bld 118 (H) 65 - 99 mg/dL   BUN 29 (H)  6 - 20 mg/dL   Creatinine, Ser 1.28 (H) 0.61 - 1.24 mg/dL   Calcium 8.8 (L) 8.9 - 10.3 mg/dL   Total Protein 6.4 (L) 6.5 - 8.1 g/dL   Albumin 2.8 (L) 3.5 - 5.0 g/dL   AST 31 15 - 41 U/L   ALT 65 (H) 17 - 63 U/L   Alkaline Phosphatase 106 38 - 126 U/L   Total Bilirubin 0.6 0.3 - 1.2 mg/dL   GFR calc non Af Amer 55 (L) >60 mL/min   GFR calc Af Amer >60 >60 mL/min   Anion gap 10 5 - 15  Glucose, capillary     Status: Abnormal   Collection Time: 11/07/15  4:24 AM  Result Value Ref Range   Glucose-Capillary 102 (H) 65 - 99 mg/dL   Comment 1 Capillary Specimen    Comment 2 Notify RN    Comment 3 Document in Chart    Dg Chest Port 1 View  11/07/2015  CLINICAL DATA:  Redo CABG EXAM: PORTABLE CHEST 1 VIEW FINDINGS: Tubular device is stable. Cardiomegaly stable. Left basilar opacity likely a combination of volume loss and pleural fluid is stable. Volume loss at the right base has improved. IMPRESSION: Improved volume loss at the right base. Stable left basilar opacity is described. Electronically Signed   By: Marybelle Killings M.D.   On: 11/07/2015 07:51    Assessment/Plan: Diagnosis: Debility in setting of multiple medical issues Labs and images independently reviewed.  Records reviewed and summated above. S/p CABG. Watch for chest pain, signs of infection or sternal instability. Sternal precautions. No bearing of wt simultaneously on B UE; no lifting UE overhead; no lifting, pushing or pulling >10lbs for 4-6 wks until cleared by Surgeon. Graded exercise program. Continue incentive spirometry to prevent atelectasis.  Wound care:.Incision left open to air; watch for signs of op site or drain site infection. Fall precautions. No getting up from the chair or bed without assistance to avoid falls and injuries  1. Does the need for close, 24 hr/day medical supervision in concert with the patient's rehab needs make it unreasonable for this patient to be served in a less intensive setting? Yes    Co-Morbidities requiring supervision/potential complications: HTN (monitor and provide prns in accordance with increased physical exertion and pain), T2DM (Monitor in accordance with exercise and adjust meds as necessary), depression/anxiety (cont meds, ensure mood does not limit functional progress), obesity Body mass index is 34.16 kg/(m^2)., diet and exercise education, encourage weight loss to increase endurance and promote overall health), recent left shoulder injury, chronic back pain (Biofeedback training with therapies to help reduce reliance on opiate pain medications, monitor pain control during therapies, and sedation at rest and titrate to maximum efficacy to ensure participation and gains in therapies), CAD 2001 (cont meds), OSA (non compliant, encourage use), PNA (cont abx), ileus (monitor for reoccurrence), dysphagia (advance as tolerated, cont SLP), Tachycardia (monitor in accordance with pain and increasing activity), tachypnea (monitor RR and O2 Sats with increased physical exertion), ABLA (transfuse if necessary to ensure appropriate perfusion for increased activity tolerance), AKI (avoid nephrotoxic meds) 2. Due to bladder management, bowel management, safety, skin/wound care, disease management and patient education, does the patient require 24 hr/day rehab nursing? Yes 3. Does the patient require coordinated care of a physician, rehab nurse, PT (1-2 hrs/day, 5 days/week), OT (  1-2 hrs/day, 5 days/week) and SLP (1-2 hrs/day, 5 days/week) to address physical and functional deficits in the context of the above medical diagnosis(es)? Yes Addressing deficits in the following areas: balance, endurance, locomotion, strength, transferring, bowel/bladder control, bathing, dressing, toileting, swallowing and psychosocial support 4. Can the patient actively participate in an intensive therapy program of at least 3 hrs of therapy per day at least 5 days per week? Yes 5. The potential for patient to  make measurable gains while on inpatient rehab is excellent 6. Anticipated functional outcomes upon discharge from inpatient rehab are modified independent and supervision  with PT, supervision and min assist with OT, independent and modified independent with SLP. 7. Estimated rehab length of stay to reach the above functional goals is: 14-17 days. 8. Does the patient have adequate social supports and living environment to accommodate these discharge functional goals? Yes 9. Anticipated D/C setting: Home 10. Anticipated post D/C treatments: HH therapy and Home excercise program 11. Overall Rehab/Functional Prognosis: excellent and good  RECOMMENDATIONS: This patient's condition is appropriate for continued rehabilitative care in the following setting: CIR once medically appropriate Patient has agreed to participate in recommended program. Yes Note that insurance prior authorization may be required for reimbursement for recommended care.  Comment: Rehab Admissions Coordinator to follow up.  Delice Lesch, MD 11/07/2015

## 2015-11-08 ENCOUNTER — Inpatient Hospital Stay (HOSPITAL_COMMUNITY): Payer: Medicare Other

## 2015-11-08 LAB — CBC
HCT: 30.3 % — ABNORMAL LOW (ref 39.0–52.0)
Hemoglobin: 9.2 g/dL — ABNORMAL LOW (ref 13.0–17.0)
MCH: 28.5 pg (ref 26.0–34.0)
MCHC: 30.4 g/dL (ref 30.0–36.0)
MCV: 93.8 fL (ref 78.0–100.0)
Platelets: 278 10*3/uL (ref 150–400)
RBC: 3.23 MIL/uL — ABNORMAL LOW (ref 4.22–5.81)
RDW: 15.3 % (ref 11.5–15.5)
WBC: 4.1 10*3/uL (ref 4.0–10.5)

## 2015-11-08 LAB — GLUCOSE, CAPILLARY
Glucose-Capillary: 128 mg/dL — ABNORMAL HIGH (ref 65–99)
Glucose-Capillary: 144 mg/dL — ABNORMAL HIGH (ref 65–99)
Glucose-Capillary: 153 mg/dL — ABNORMAL HIGH (ref 65–99)
Glucose-Capillary: 167 mg/dL — ABNORMAL HIGH (ref 65–99)
Glucose-Capillary: 183 mg/dL — ABNORMAL HIGH (ref 65–99)
Glucose-Capillary: 83 mg/dL (ref 65–99)
Glucose-Capillary: 99 mg/dL (ref 65–99)

## 2015-11-08 MED ORDER — FUROSEMIDE 40 MG PO TABS
40.0000 mg | ORAL_TABLET | Freq: Every day | ORAL | Status: DC
  Administered 2015-11-08 – 2015-11-14 (×7): 40 mg via ORAL
  Filled 2015-11-08 (×7): qty 1

## 2015-11-08 MED ORDER — POTASSIUM CHLORIDE 20 MEQ/15ML (10%) PO SOLN
20.0000 meq | Freq: Every day | ORAL | Status: DC
  Administered 2015-11-08 – 2015-11-13 (×6): 20 meq via ORAL
  Filled 2015-11-08 (×6): qty 15

## 2015-11-08 MED ORDER — FUROSEMIDE 10 MG/ML IJ SOLN: 20.0000 mg | Freq: Every day | INTRAMUSCULAR | Status: DC

## 2015-11-08 MED ORDER — CARVEDILOL 12.5 MG PO TABS
12.5000 mg | ORAL_TABLET | Freq: Two times a day (BID) | ORAL | Status: DC
  Administered 2015-11-08 – 2015-11-14 (×13): 12.5 mg via ORAL
  Filled 2015-11-08 (×13): qty 1

## 2015-11-08 MED ORDER — CLOPIDOGREL BISULFATE 75 MG PO TABS
75.0000 mg | ORAL_TABLET | Freq: Every day | ORAL | Status: DC
  Administered 2015-11-08 – 2015-11-14 (×7): 75 mg via ORAL
  Filled 2015-11-08 (×7): qty 1

## 2015-11-08 MED ORDER — MIRTAZAPINE 15 MG PO TABS
15.0000 mg | ORAL_TABLET | Freq: Every day | ORAL | Status: DC
  Administered 2015-11-08 – 2015-11-13 (×6): 15 mg via ORAL
  Filled 2015-11-08 (×6): qty 1

## 2015-11-08 MED ORDER — POTASSIUM CHLORIDE CRYS ER 20 MEQ PO TBCR: 20.0000 meq | EXTENDED_RELEASE_TABLET | Freq: Every day | ORAL | Status: DC

## 2015-11-08 NOTE — Progress Notes (Signed)
Pulled pacing wires out. No resistance met, end tips intact with no blood or tissue noted. Pt is on bedrest for an hour. Will continue to monitor closely.

## 2015-11-08 NOTE — Progress Notes (Signed)
Speech Language Pathology Treatment: Dysphagia  Patient Details Name: Tyler Le MRN: [id number] DOB: 08-02-1945 Today's Date: 11/08/2015 Time: [id number] SLP Time Calculation (min) (ACUTE ONLY): 11 min  Assessment / Plan / Recommendation Clinical Impression  Pt seen with PMV in place for PO trials following MBS on previous date. Min cues provided for recall of recommended strategies, without further cueing needed during consumption. No overt s/s of aspiration noted, and vocal quality remained clear. He does a great job of taking his time and going slowly. Recommend to continue with current diet.   HPI HPI: pt presents with Cardiac Arrest and Redo of CABG x3. pt intubated 4/24 and with Trach on 5/5. pt with hx of CABG, Anxiety, HTN, DM, CAD, Renal Insufficiency, and Back Surgeries.       SLP Plan  Continue with current plan of care     Recommendations  Diet recommendations: Dysphagia 2 (fine chop);Nectar-thick liquid Liquids provided via: Cup;No straw Medication Administration: Crushed with puree Supervision: Patient able to self feed;Full supervision/cueing for compensatory strategies Compensations: Slow rate;Small sips/bites;Multiple dry swallows after each bite/sip Postural Changes and/or Swallow Maneuvers: Seated upright 90 degrees;Upright 30-60 min after meal      Patient may use Passy-Muir Speech Valve: During all waking hours (remove during sleep) PMSV Supervision: Intermittent      Oral Care Recommendations: Oral care BID Follow up Recommendations: Inpatient Rehab Plan: Continue with current plan of care     GO               Maxcine HamLaura Paiewonsky, M.A. CCC-SLP [phone]  Maxcine Hamaiewonsky, Khiyan Crace 11/08/2015, 4:03 PM

## 2015-11-08 NOTE — Progress Notes (Signed)
Inpatient Rehabilitation  Met with patient to discuss team's recommendation for IP Rehab.  Patient agreeable to look over booklets and discuss program with his wife.  Plan to follow along for timing of medical readiness, insurance authorization, bed availability.  Please call with questions.   Carmelia Roller., CCC/SLP Admission Coordinator  [facility]  Cell [phone]

## 2015-11-08 NOTE — Progress Notes (Signed)
19 Days Post-Op Procedure(s) (LRB): REDO CORONARY ARTERY BYPASS GRAFTING (CABG), TIMES THREE, USING LEFT GREATER SAPHENOUS VEIN HARVESTED ENDOSCOPICALLY AND CRYO VEIN, WITH CORONARY ENDARTERECTOMY (N/A) TRANSESOPHAGEAL ECHOCARDIOGRAM (TEE) (N/A) ENDARTERECTOMY CAROTID (Right) Subjective: Redo CABG x3 with coronary endarterectomy, combined right carotid endarterectomy Segment of cryopreserved vein used for RCA graft Preop severe COPD with postop acute pulmonary insufficiency requiring tracheostomy for prolonged ventilator dependence Postop atrial fibrillation now resolved Patient on trach collar and lady hallway ready to go to step down He passed her swallow test and is on a dysphagia diet We'll start patient on oral Plavix that he is taking by mouth's for his cryopreserved vein graft PA and lateral chest x-ray today is clear Objective: Vital signs in last 24 hours: Temp:  [98.2 F (36.8 C)-98.9 F (37.2 C)] 98.8 F (37.1 C) (05/17 0822) Pulse Rate:  [68-112] 106 (05/17 0920) Cardiac Rhythm:  [-] Sinus tachycardia (05/17 0800) Resp:  [12-30] 23 (05/17 0920) BP: (93-169)/(53-108) 132/61 mmHg (05/17 0920) SpO2:  [92 %-100 %] 92 % (05/17 0920) FiO2 (%):  [25 %-28 %] 25 % (05/17 0800) Weight:  [276 lb 6.4 oz (125.374 kg)] 276 lb 6.4 oz (125.374 kg) (05/17 0500)  Hemodynamic parameters for last 24 hours:  sinus  Intake/Output from previous day: 05/16 0701 - 05/17 0700 In: 440 [P.O.:240; NG/GT:150; IV Piggyback:50] Out: 2400 [Urine:2400] Intake/Output this shift: Total I/O In: 10 [I.V.:10] Out: 200 [Urine:200]  Patient resting comfortably Lungs clear Abdomen soft  Lab Results:  Recent Labs  11/07/15 0325 11/08/15 0427  WBC 4.6 4.1  HGB 9.2* 9.2*  HCT 29.9* 30.3*  PLT 256 278   BMET:  Recent Labs  11/07/15 0325  NA 142  K 3.9  CL 101  CO2 31  GLUCOSE 118*  BUN 29*  CREATININE 1.28*  CALCIUM 8.8*    PT/INR: No results for input(s): LABPROT, INR in the last 72  hours. ABG    Component Value Date/Time   PHART 7.305* 10/28/2015 0527   HCO3 34.6* 10/28/2015 0527   TCO2 31 11/03/2015 1840   ACIDBASEDEF 1.0 10/20/2015 1824   O2SAT 67.0 10/31/2015 0345   CBG (last 3)   Recent Labs  11/07/15 2356 11/08/15 0356 11/08/15 0820  GLUCAP 99 83 128*    Assessment/Plan: S/P Procedure(s) (LRB): REDO CORONARY ARTERY BYPASS GRAFTING (CABG), TIMES THREE, USING LEFT GREATER SAPHENOUS VEIN HARVESTED ENDOSCOPICALLY AND CRYO VEIN, WITH CORONARY ENDARTERECTOMY (N/A) TRANSESOPHAGEAL ECHOCARDIOGRAM (TEE) (N/A) ENDARTERECTOMY CAROTID (Right) Mobilize Diuresis Plan for transfer to step-down: see transfer orders Start by mouth Plavix for cryopreserved vein graft at time of redo CABG   LOS: 23 days    Kathlee Nationseter Van Trigt III 11/08/2015

## 2015-11-08 NOTE — Progress Notes (Signed)
OT Cancellation Note  Patient Details Name: Tyler DeutscherDwight E Stemmler MRN: [id number] DOB: 09-28-1945   Cancelled Treatment:    Reason Eval/Treat Not Completed: Medical issues which prohibited therapy (Bedrest post removal of pacing wires. Will follow.)  Evern BioMayberry, Mariadel Mruk Lynn 11/08/2015, 2:24 PM

## 2015-11-09 LAB — GLUCOSE, CAPILLARY
Glucose-Capillary: 105 mg/dL — ABNORMAL HIGH (ref 65–99)
Glucose-Capillary: 87 mg/dL (ref 65–99)
Glucose-Capillary: 143 mg/dL — ABNORMAL HIGH (ref 65–99)
Glucose-Capillary: 165 mg/dL — ABNORMAL HIGH (ref 65–99)
Glucose-Capillary: 190 mg/dL — ABNORMAL HIGH (ref 65–99)
Glucose-Capillary: 160 mg/dL — ABNORMAL HIGH (ref 65–99)

## 2015-11-09 LAB — CBC
HCT: 30.3 % — ABNORMAL LOW (ref 39.0–52.0)
Hemoglobin: 9.2 g/dL — ABNORMAL LOW (ref 13.0–17.0)
MCH: 28.5 pg (ref 26.0–34.0)
MCHC: 30.4 g/dL (ref 30.0–36.0)
MCV: 93.8 fL (ref 78.0–100.0)
Platelets: 287 10*3/uL (ref 150–400)
RBC: 3.23 MIL/uL — ABNORMAL LOW (ref 4.22–5.81)
RDW: 15.3 % (ref 11.5–15.5)
WBC: 4.9 10*3/uL (ref 4.0–10.5)

## 2015-11-09 LAB — BASIC METABOLIC PANEL
Anion gap: 9 (ref 5–15)
BUN: 24 mg/dL — ABNORMAL HIGH (ref 6–20)
CO2: 31 mmol/L (ref 22–32)
Calcium: 8.8 mg/dL — ABNORMAL LOW (ref 8.9–10.3)
Chloride: 100 mmol/L — ABNORMAL LOW (ref 101–111)
Creatinine, Ser: 1.22 mg/dL (ref 0.61–1.24)
GFR calc Af Amer: >60 mL/min (ref >60)
GFR calc non Af Amer: 58 mL/min — ABNORMAL LOW (ref >60)
Glucose, Bld: 78 mg/dL (ref 65–99)
Potassium: 4 mmol/L (ref 3.5–5.1)
Sodium: 140 mmol/L (ref 135–145)

## 2015-11-09 MED ORDER — DOCUSATE SODIUM 50 MG/5ML PO LIQD
200.0000 mg | Freq: Two times a day (BID) | ORAL | Status: DC
  Administered 2015-11-09 – 2015-11-12 (×6): 200 mg via ORAL
  Administered 2015-11-13: 100 mg via ORAL
  Filled 2015-11-09 (×10): qty 20

## 2015-11-09 MED ORDER — LISINOPRIL 5 MG PO TABS
5.0000 mg | ORAL_TABLET | Freq: Every day | ORAL | Status: DC
  Administered 2015-11-09 – 2015-11-10 (×2): 5 mg via ORAL
  Filled 2015-11-09 (×3): qty 1

## 2015-11-09 NOTE — Progress Notes (Signed)
1413 Offered to walk with pt but he declined due to having loose BMs. Getting cleaned up at this time. Pt stated afraid he would not be able to control. Will follow up tomorrow. Luetta NuttingCharlene Ghazi Rumpf RN BSN 11/09/2015 2:14 PM ;

## 2015-11-09 NOTE — Progress Notes (Signed)
Name: Tyler Le MRN: [id number] DOB: [birth year]    ADMISSION DATE:  10/16/2015 CONSULTATION DATE:  10/18/15  REFERRING MD : Donata Clay, MD  CHIEF COMPLAINT:  Hypercapnia  DISCUSSION: 70 y/o male with obesity hypoventilation syndrome who underwent a redo CABG and CEA on 4/28. Remains on vent post-operatively - anxiety/encephalopathy, bradycardia/hypotension as barriers -S/p trach 5/5. ABX started on 5/6. Tolerating ATC trials.   SUBJECTIVE:  Pt alert and awake no acute distress tolerating 28% trach collar with PMV in place.  Tolerating diet no c/o shortness of breath at rest.  Ambulated around unit tolerated well with some slight dyspnea with exertion O2 sats post ambulation 97% per Physical Therapist.   VITAL SIGNS: Temp:  [97.6 F (36.4 C)-98.8 F (37.1 C)] 97.6 F (36.4 C) (05/18 0341) Pulse Rate:  [81-106] 101 (05/18 0341) Resp:  [10-23] 20 (05/18 0341) BP: (110-177)/(59-84) 138/68 mmHg (05/18 0500) SpO2:  [91 %-96 %] 93 % (05/18 0500) FiO2 (%):  [28 %] 28 % (05/18 0500) Weight:  [275 lb (124.739 kg)] 275 lb (124.739 kg) (05/18 0341)  Intake/Output Summary (Last 24 hours) at 11/09/15 0910 Last data filed at 11/08/15 2045  Gross per 24 hour  Intake    300 ml  Output    650 ml  Net   -350 ml   PHYSICAL EXAMINATION: General:  Obese male; up and about to ambulate around unit. HENT: NCAT; trach C/D/I, on 28% ATC. PULM: resps even non labored on ATC and PMV, dec BS on bibasilar lungs. CV: Sinus tach, RRR, no clear murmur. GI: BS+, soft, distended. MSK: normal bulk and tone, moves all extremities. Neuro: alert, interactive, speech ok,, follows commands.  Recent Labs Lab 11/05/15 0423 11/07/15 0325 11/09/15 0435  NA 139 142 140  K 4.3 3.9 4.0  CL 100* 101 100*  CO2 28 31 31   BUN 34* 29* 24*  CREATININE 1.08 1.28* 1.22  GLUCOSE 196* 118* 78    Recent Labs Lab 11/07/15 0325 11/08/15 0427 11/09/15 0435  HGB 9.2* 9.2* 9.2*  HCT 29.9* 30.3* 30.3*  WBC 4.6  4.1 4.9  PLT 256 278 287   Dg Chest 2 View  11/08/2015  CLINICAL DATA:  Post CABG, soreness and weakness EXAM: CHEST  2 VIEW COMPARISON:  Portable chest x-ray of 11/07/2015 FINDINGS: Aeration of the lung bases has improved. There is still a small left pleural effusion present with mild left basilar atelectasis. Tracheostomy is unchanged in position. Right central venous line remains. Heart size is stable. IMPRESSION: Slightly better aeration. Persistent left basilar atelectasis and small left pleural effusion. Electronically Signed   By: Dwyane Dee M.D.   On: 11/08/2015 08:13   Dg Swallowing Func-speech Pathology  11/07/2015  Objective Swallowing Evaluation: Type of Study: MBS-Modified Barium Swallow Study Patient Details Name: Tyler Le MRN: [id number] Date of Birth: 02/14/1946 Today's Date: 11/07/2015 Time: SLP Start Time (ACUTE ONLY): 1013-SLP Stop Time (ACUTE ONLY): 1028 SLP Time Calculation (min) (ACUTE ONLY): 15 min Past Medical History: Past Medical History Diagnosis Date . Essential hypertension  . Dyslipidemia  . Coronary artery disease    a. 2001: s/p CABG X4;  b. 08/2010 s/p PCI/BMS to VG->RI->OM;  c. 06/2014 Cath/PCI: LM nl, LAD 183m, LCX 51m, RI 100, OM1 100, OM2 80, small, RCA 100ost, VG->RCA 100, VG->Diag 100, VG->RI->OM1 70-80 ISR (4.0x24 Promus DES), LIMA->LAD nl. . Morbid obesity (HCC)  . Dysrhythmia  . History of blood transfusion 2001   "related to OHS" . Anemia  .  GERD (gastroesophageal reflux disease)  . Depression  . Anxiety  . Nephrolithiasis  . Complication of anesthesia    "they have a hard time waking him up" . Type II diabetes mellitus (HCC)  . Arthritis    "back, neck" (07/11/2014) . Chronic lower back pain  . Thrombocytopenia (HCC)    a. Borderline low platelets by labs noted in 06/2014 but also noted on prior labs as well. . Renal insufficiency    a. noted 06/2014 - baseline unclear as no data since 2012. Marland Kitchen RBBB  Past Surgical History: Past Surgical History Procedure Laterality  Date . Cardiac catheterization  2001; ~ 2010 . Coronary artery bypass graft  2001   "CABG X 5" . Appendectomy  ~ 1955 . Lumbar laminectomy  01/2011 . Back surgery   . Cholecystectomy   . Shoulder arthroscopy w/ rotator cuff repair Left 1990's . Cardiovascular stress test  07/03/2009   EF 64% . Left heart catheterization with coronary/graft angiogram N/A 05/09/2011   Procedure: LEFT HEART CATHETERIZATION WITH Isabel Caprice;  Surgeon: Kathleene Hazel, MD;  Location: [facility] CATH LAB;  Service: Cardiovascular;  Laterality: N/A; . Tonsillectomy and adenoidectomy  1951 . Left heart catheterization with coronary angiogram N/A 07/11/2014   Procedure: LEFT HEART CATHETERIZATION WITH CORONARY ANGIOGRAM;  Surgeon: Peter M Swaziland, MD;  Location: [facility] CATH LAB;  Service: Cardiovascular;  Laterality: N/A; . Coronary angioplasty with stent placement  March 2012   BMS to SVG to intermediate/OM . Coronary angioplasty with stent placement  07/11/2014   "1" . Cardiac catheterization N/A 10/16/2015   Procedure: Left Heart Cath and Cors/Grafts Angiography;  Surgeon: Corky Crafts, MD;  Location: [facility] INVASIVE CV LAB;  Service: Cardiovascular;  Laterality: N/A; . Coronary artery bypass graft N/A 10/20/2015   Procedure: REDO CORONARY ARTERY BYPASS GRAFTING (CABG), TIMES THREE, USING LEFT GREATER SAPHENOUS VEIN HARVESTED ENDOSCOPICALLY AND CRYO VEIN, WITH CORONARY ENDARTERECTOMY;  Surgeon: Kerin Perna, MD;  Location: [facility] OR;  Service: Open Heart Surgery;  Laterality: N/A;  SVG to LAD, SVG to OM, CRYOVEIN to RCA with Endarterectomy . Tee without cardioversion N/A 10/20/2015   Procedure: TRANSESOPHAGEAL ECHOCARDIOGRAM (TEE);  Surgeon: Kerin Perna, MD;  Location: [facility] OR;  Service: Open Heart Surgery;  Laterality: N/A; . Endarterectomy Right 10/20/2015   Procedure: ENDARTERECTOMY CAROTID;  Surgeon: Pryor Ochoa, MD;  Location: [facility] OR;  Service: Vascular;  Laterality: Right; HPI: pt presents with Cardiac Arrest and Redo of CABG  x3. pt intubated 4/24 and with Trach on 5/5. pt with hx of CABG, Anxiety, HTN, DM, CAD, Renal Insufficiency, and Back Surgeries.  Subjective: "I'm tired" Assessment / Plan / Recommendation CHL IP CLINICAL IMPRESSIONS 11/07/2015 Therapy Diagnosis Mild pharyngeal phase dysphagia Clinical Impression Pt has a mild pharyngeal dysphagia due to delay in swallow trigger and reduced movement of his hyolaryngeal musculature, leading to incomplete airway protection during the swallow and moderate vallecular residue. Silent penetration occurs with thin liquids, requiring cues for multiple throat clears and coughs to clear. Nectar thick liquids reach the pyriform sinuses prior to swallwo triggger, also putting them at risk for airway invasion, although they are better contained during single cup sips. He appears to have good sensation of residue in his valleculae, as he spontaneously brings the residue back into his oral cavity and reswallows until it is better cleared. Recommend Dys 2 diet to reduce the amount of pharyngeal residue and nectar thick liquids by cup. Pt should have PMV in place during all PO intake. Will continue  to follow and progress as able. Impact on safety and function Mild aspiration risk   CHL IP TREATMENT RECOMMENDATION 11/07/2015 Treatment Recommendations Therapy as outlined in treatment plan below   Prognosis 11/07/2015 Prognosis for Safe Diet Advancement Good Barriers to Reach Goals -- Barriers/Prognosis Comment -- CHL IP DIET RECOMMENDATION 11/07/2015 SLP Diet Recommendations Dysphagia 2 (Fine chop) solids;Nectar thick liquid Liquid Administration via Cup;No straw Medication Administration Crushed with puree Compensations Slow rate;Small sips/bites;Multiple dry swallows after each bite/sip Postural Changes Seated upright at 90 degrees   CHL IP OTHER RECOMMENDATIONS 11/07/2015 Recommended Consults -- Oral Care Recommendations Oral care BID Other Recommendations Order thickener from pharmacy;Prohibited  food (jello, ice cream, thin soups);Remove water pitcher   CHL IP FOLLOW UP RECOMMENDATIONS 11/07/2015 Follow up Recommendations Inpatient Rehab   CHL IP FREQUENCY AND DURATION 11/07/2015 Speech Therapy Frequency (ACUTE ONLY) min 2x/week Treatment Duration 2 weeks      CHL IP ORAL PHASE 11/07/2015 Oral Phase WFL Oral - Pudding Teaspoon -- Oral - Pudding Cup -- Oral - Honey Teaspoon -- Oral - Honey Cup -- Oral - Nectar Teaspoon -- Oral - Nectar Cup -- Oral - Nectar Straw -- Oral - Thin Teaspoon -- Oral - Thin Cup -- Oral - Thin Straw -- Oral - Puree -- Oral - Mech Soft -- Oral - Regular -- Oral - Multi-Consistency -- Oral - Pill -- Oral Phase - Comment --  CHL IP PHARYNGEAL PHASE 11/07/2015 Pharyngeal Phase Impaired Pharyngeal- Pudding Teaspoon -- Pharyngeal -- Pharyngeal- Pudding Cup -- Pharyngeal -- Pharyngeal- Honey Teaspoon -- Pharyngeal -- Pharyngeal- Honey Cup -- Pharyngeal -- Pharyngeal- Nectar Teaspoon -- Pharyngeal -- Pharyngeal- Nectar Cup Delayed swallow initiation-vallecula;Reduced epiglottic inversion;Reduced anterior laryngeal mobility;Reduced laryngeal elevation;Reduced airway/laryngeal closure;Reduced tongue base retraction;Pharyngeal residue - valleculae Pharyngeal -- Pharyngeal- Nectar Straw Reduced epiglottic inversion;Reduced anterior laryngeal mobility;Reduced laryngeal elevation;Reduced airway/laryngeal closure;Reduced tongue base retraction;Pharyngeal residue - valleculae;Delayed swallow initiation-pyriform sinuses Pharyngeal -- Pharyngeal- Thin Teaspoon -- Pharyngeal -- Pharyngeal- Thin Cup Reduced epiglottic inversion;Reduced anterior laryngeal mobility;Reduced laryngeal elevation;Reduced airway/laryngeal closure;Reduced tongue base retraction;Pharyngeal residue - valleculae;Delayed swallow initiation-pyriform sinuses;Penetration/Aspiration before swallow Pharyngeal Material enters airway, remains ABOVE vocal cords and not ejected out Pharyngeal- Thin Straw -- Pharyngeal -- Pharyngeal- Puree  Delayed swallow initiation-vallecula;Reduced epiglottic inversion;Reduced anterior laryngeal mobility;Reduced laryngeal elevation;Reduced airway/laryngeal closure;Reduced tongue base retraction;Pharyngeal residue - valleculae Pharyngeal -- Pharyngeal- Mechanical Soft Delayed swallow initiation-vallecula;Reduced epiglottic inversion;Reduced anterior laryngeal mobility;Reduced laryngeal elevation;Reduced airway/laryngeal closure;Reduced tongue base retraction;Pharyngeal residue - valleculae Pharyngeal -- Pharyngeal- Regular -- Pharyngeal -- Pharyngeal- Multi-consistency -- Pharyngeal -- Pharyngeal- Pill -- Pharyngeal -- Pharyngeal Comment --  CHL IP CERVICAL ESOPHAGEAL PHASE 11/07/2015 Cervical Esophageal Phase WFL Pudding Teaspoon -- Pudding Cup -- Honey Teaspoon -- Honey Cup -- Nectar Teaspoon -- Nectar Cup -- Nectar Straw -- Thin Teaspoon -- Thin Cup -- Thin Straw -- Puree -- Mechanical Soft -- Regular -- Multi-consistency -- Pill -- Cervical Esophageal Comment -- No flowsheet data found. Maxcine HamLaura Paiewonsky, M.A. CCC-SLP [phone] Maxcine Hamaiewonsky, Laura 11/07/2015, 1:20 PM               STUDIES:  PFT 4/25 >> restrictive lung dz, low DLCO diffusion LE Doppler 5/2 >>  negative Echo 4/25>>EF 55%-60%  CULTURES: Sputum 5/5 >>few yeast consistent with candida species Sputum 5/6>>>serratia>>> RES cefazolin, otherwise sens  BC 5/6>>>negative 5/11 Urine 5/6>>>GNR>>>Serratia>>>RES cefazolin and nitrofurantoin  MRSA PCR 4/27>>negative  ANTIBIOTICS: Cefuroxime 4/28 >> 4/30 vanc 5/6>>off  Zosyn 5/6>>>5/11 Ceftriaxone 5/11>>>5/18  SIGNIFICANT EVENTS: 4/28 CABG and CEA 5/01 Bradycardia / hypotension with any weaning of  sedation 5/02 Episodes of desaturation, increased O2 needs 5/03 Weaned off dopamine, neo, sedation reduced 5/5 trach.  5/6 fevers, still on fio2 60%/peep 8. ABX started; lasix cut back to daily  5/7 Creatinine up. Lasix stopped. Weaning on PS 10/peep 8, fent gtt stopped and changed to  PRN 5/16 Passed swallow eval and cleared for dysphagia diet  LINES/TUBES: 4/28  midine R arm >>5/1  4/28  L subclavian introducer >  4/28  R neck drain >> 4/30 4/28  mediastinal chest tubes >> 4/30 5/01  PICC >> Trach 5/5 (JY)>>>     ASSESSMENT / PLAN:  PULMONARY A:  Chronic hypoxemic respiratory failure with hypercapnea > improving. On on trach collar since ~5/11 and progressing well. Obesity hypoventilation syndrome. OSA, non compliant. 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 Serratia PNA - s/p abx. P:   TC as tolerated - attempt to wean to room air. No capping of trach at night given OSA history  Mobilize as able - PT involved. No plans for decannulation at this point given that he is non-compliant with CPAP. We will keep present #6 cuffless.  NEUROLOGIC A:   Acute encephalopathy > improved. Question if fever +/- some level of infection was contributing factor. Baseline mental illness, poorly understood, multiple medications at home. Chronic pain. P:   PRN fentanyl. Continue cymbalta, buspar, seroquel. Mobilize.  CARDIOVASCULAR A:  S/p CABG and (R) CEA. P:  Per vascular and TCTS. Monitor hemodynamics.  RENAL A:   AKI. P:   Monitor BMET and UOP. Replace electrolytes as needed.  GASTROINTESTINAL A:   Morbid Obesity. Nutrition - cleared for dysphagia diet per SLP. P:   PPI. Continue diet  HEMATOLOGIC A:   Anemia, no bleeding. Thrombocytopenia - resolved. P:  Transfusion per usual ICU guidelines. Lovenox. CBC in AM.  INFECTIOUS A:   Serratia UTI - on ceftriaxone, stop date 5/17 for 7 days therapy. Serratia PNA - s/p abx. P:   Discontinue antibiotics Trend WBC's and monitor fever curve  ENDOCRINE A:   Hyperglycemia P:   Continue home liraglutide, SSI, levemir.  FAMILY  - Updates: pt updated at bedside   Sonda Rumble, AGNP  Pulmonary/Critical Care   Attending note: I have seen and examined the patient with nurse  practitioner/resident and agree with the note. History, labs and imaging reviewed.  Chronic hypoxic resp failure, OSA/OHS-non compliant. Continuing on PMV during waking hrs and remove during sleep.  Plan to keep the trach in place given h/o non compliance with CPAP.  PCCM will follow intermittently for trach care.  Chilton Greathouse MD [facility] Pager [phone] If no answer or after 3pm call: [phone] 11/09/2015, 4:03 PM

## 2015-11-09 NOTE — Progress Notes (Signed)
Occupational Therapy Treatment Patient Details Name: Tyler Le MRN: [id number] DOB: 01/02/1946 Today's Date: 11/09/2015    History of present illness pt presents with Cardiac Arrest and Redo of CABG x3.  pt intubated 4/24 and with Trach on 5/5.  pt with hx of CABG, Anxiety, HTN, DM, CAD, Renal Insufficiency, and Back Surgeries.     OT comments  Pt continues to progress steadily. Focus of session on ADL training with pt requiring supervision and set up for seated grooming and min to max assist for sponge bathing and dressing. Pt able to state "no pushing or pulling" with his arms, but does not generalize sternal precautions. Pt cooperative and eager to get stronger so he may go home.  Follow Up Recommendations  CIR    Equipment Recommendations  None recommended by OT    Recommendations for Other Services      Precautions / Restrictions Precautions Precautions: Fall;Sternal Precaution Comments: Trach collar, Watch O2 sats Restrictions Other Position/Activity Restrictions: sternal precautions       Mobility Bed Mobility               General bed mobility comments: pt in chair  Transfers Overall transfer level: Needs assistance Equipment used: Rolling walker (2 wheeled) Transfers: Sit to/from Stand Sit to Stand: Min assist         General transfer comment: cues for UEs to knees and using momentum to come to standing, min assist to rise and steady      Balance     Sitting balance-[name] Scale: Good       Standing balance-[name] Scale: Poor (able to release walker with one hand to perform pericare)                     ADL Overall ADL's : Needs assistance/impaired Eating/Feeding: Independent;Sitting   Grooming: Wash/dry hands;Wash/dry face;Oral care;Sitting;Supervision/safety   Upper Body Bathing: Minimal assitance;Sitting Upper Body Bathing Details (indicate cue type and reason): assisted for back Lower Body Bathing: Maximal assistance;Sit  to/from stand   Upper Body Dressing : Minimal assistance;Sitting   Lower Body Dressing: Maximal assistance;Sit to/from stand       Toileting- Architect and Hygiene: Moderate assistance;Sit to/from stand         General ADL Comments: Pt recalling sternal precautions. Cues for thoroughness and sequencing with sponge bathing      Vision                     Perception     Praxis      Cognition   Behavior During Therapy: [facility] for tasks assessed/performed Overall Cognitive Status: Impaired/Different from baseline Area of Impairment: Memory;Problem solving     Memory: Decreased short-term memory;Decreased recall of precautions        Problem Solving: Slow processing;Difficulty sequencing;Requires verbal cues;Requires tactile cues      Extremity/Trunk Assessment               Exercises     Shoulder Instructions       General Comments      Pertinent Vitals/ Pain       Pain Assessment: No/denies pain  Home Living                                          Prior Functioning/Environment  Frequency Min 2X/week     Progress Toward Goals  OT Goals(current goals can now be found in the care plan section)  Progress towards OT goals: Progressing toward goals  Acute Rehab OT Goals Time For Goal Achievement: 11/17/15 Potential to Achieve Goals: Good  Plan Discharge plan remains appropriate    Co-evaluation                 End of Session Equipment Utilized During Treatment: Oxygen   Activity Tolerance Patient tolerated treatment well   Patient Left in chair;with call bell/phone within reach (with PT in room)   Nurse Communication          Time: 0454-0981 OT Time Calculation (min): 27 min  Charges: OT General Charges $OT Visit: 1 Procedure OT Treatments $Self Care/Home Management : 23-37 mins  Evern Bio 11/09/2015, 9:01 AM  [phone]

## 2015-11-09 NOTE — Care Management Important Message (Signed)
Important Message  Patient Details  Name: Tyler Le MRN: [id number] Date of Birth: [birth year]   Medicare Important Message Given:  Yes    Darrold SpanWebster, Marg Macmaster Hall, RN 11/09/2015, 10:36 AM

## 2015-11-09 NOTE — Progress Notes (Signed)
Physical Therapy Treatment Patient Details Name: Tyler Le MRN: [id number] DOB: April 22, 1946 Today's Date: 11/09/2015    History of Present Illness pt presents with Cardiac Arrest and Redo of CABG x3.  pt intubated 4/24 and with Trach on 5/5.  pt with hx of CABG, Anxiety, HTN, DM, CAD, Renal Insufficiency, and Back Surgeries.      PT Comments    Pt making good progress. Continue to feel pt can benefit from CIR prior to return home.  Follow Up Recommendations  CIR     Equipment Recommendations  Other (comment) (rollator)    Recommendations for Other Services       Precautions / Restrictions Precautions Precautions: Fall;Sternal Precaution Comments: Trach collar, Watch O2 sats Restrictions Other Position/Activity Restrictions: sternal precautions    Mobility  Bed Mobility               General bed mobility comments: pt in chair  Transfers Overall transfer level: Needs assistance Equipment used: Rolling walker (2 wheeled) Transfers: Sit to/from Stand Sit to Stand: Min assist         General transfer comment: cues for UEs to knees and using momentum to come to standing, min assist to rise and steady    Ambulation/Gait Ambulation/Gait assistance: Min assist Ambulation Distance (Feet): 85 Feet (x 2) Assistive device: 4-wheeled walker Gait Pattern/deviations: Step-through pattern;Decreased stride length;Trunk flexed Gait velocity: decr Gait velocity interpretation: Below normal speed for age/gender General Gait Details: Assist for balance and support. 1 sitting rest break on rollator. Pt amb on 35% trach collar (pt on 28% in room but adapter for that level not present).   Stairs            Wheelchair Mobility    Modified Rankin (Stroke Patients Only)       Balance Overall balance assessment: Needs assistance Sitting-balance support: No upper extremity supported Sitting balance-[name] Scale: Good     Standing balance support: Bilateral upper  extremity supported Standing balance-[name] Scale: Poor Standing balance comment: support of rollator and supervision for static standing                    Cognition Arousal/Alertness: Awake/alert Behavior During Therapy: WFL for tasks assessed/performed Overall Cognitive Status: Impaired/Different from baseline Area of Impairment: Memory;Problem solving     Memory: Decreased short-term memory;Decreased recall of precautions       Problem Solving: Slow processing;Difficulty sequencing;Requires verbal cues;Requires tactile cues      Exercises      General Comments        Pertinent Vitals/Pain Pain Assessment: No/denies pain    Home Living                      Prior Function            PT Goals (current goals can now be found in the care plan section) Acute Rehab PT Goals PT Goal Formulation: With patient Time For Goal Achievement: 11/16/15 Potential to Achieve Goals: Good Progress towards PT goals: Goals met and updated - see care plan    Frequency  Min 3X/week    PT Plan Current plan remains appropriate;Frequency needs to be updated    Co-evaluation             End of Session Equipment Utilized During Treatment: Gait belt;Oxygen Activity Tolerance: Patient tolerated treatment well Patient left: in chair;with call bell/phone within reach     Time: 4098-1191 PT Time Calculation (min) (ACUTE ONLY): 16 min  Charges:  $Gait Training: 8-22 mins                    G Codes:      Grantley Savage 2015-11-14, 9:24 AM Skip Mayer PT [phone]

## 2015-11-09 NOTE — Progress Notes (Signed)
      [address]       [place] [id number]             [phone]      20 Days Post-Op Procedure(s) (LRB): REDO CORONARY ARTERY BYPASS GRAFTING (CABG), TIMES THREE, USING LEFT GREATER SAPHENOUS VEIN HARVESTED ENDOSCOPICALLY AND CRYO VEIN, WITH CORONARY ENDARTERECTOMY (N/A) TRANSESOPHAGEAL ECHOCARDIOGRAM (TEE) (N/A) ENDARTERECTOMY CAROTID (Right)   Subjective:  [name] has no new complaints.  He does question when his left sided carotid would be done.  Getting ready to work with PT  Objective: Vital signs in last 24 hours: Temp:  [97.6 F (36.4 C)-98.8 F (37.1 C)] 97.6 F (36.4 C) (05/18 0341) Pulse Rate:  [81-106] 101 (05/18 0341) Cardiac Rhythm:  [-] Normal sinus rhythm (05/18 0700) Resp:  [10-23] 20 (05/18 0341) BP: (110-177)/(59-84) 138/68 mmHg (05/18 0500) SpO2:  [91 %-96 %] 93 % (05/18 0500) FiO2 (%):  [28 %] 28 % (05/18 0500) Weight:  [275 lb (124.739 kg)] 275 lb (124.739 kg) (05/18 0341)  Intake/Output from previous day: 05/17 0701 - 05/18 0700 In: 300 [P.O.:240; I.V.:10; IV Piggyback:50] Out: 650 [Urine:650]  General appearance: alert, cooperative and no distress Heart: regular rate and rhythm Lungs: clear to auscultation bilaterally Abdomen: soft, non-tender; bowel sounds normal; no masses,  no organomegaly Extremities: edema trace Wound: clean and dry, staples remain in place along right neck  Lab Results:  Recent Labs  11/08/15 0427 11/09/15 0435  WBC 4.1 4.9  HGB 9.2* 9.2*  HCT 30.3* 30.3*  PLT 278 287   BMET:  Recent Labs  11/07/15 0325 11/09/15 0435  NA 142 140  K 3.9 4.0  CL 101 100*  CO2 31 31  GLUCOSE 118* 78  BUN 29* 24*  CREATININE 1.28* 1.22  CALCIUM 8.8* 8.8*    PT/INR: No results for input(s): LABPROT, INR in the last 72 hours. ABG    Component Value Date/Time   PHART 7.305* 10/28/2015 0527   HCO3 34.6* 10/28/2015 0527   TCO2 31 11/03/2015 1840   ACIDBASEDEF 1.0 10/20/2015 1824   O2SAT 67.0  10/31/2015 0345   CBG (last 3)   Recent Labs  11/08/15 2118 11/09/15 0109 11/09/15 0446  GLUCAP 144* 105* 87    Assessment/Plan: S/P Procedure(s) (LRB): REDO CORONARY ARTERY BYPASS GRAFTING (CABG), TIMES THREE, USING LEFT GREATER SAPHENOUS VEIN HARVESTED ENDOSCOPICALLY AND CRYO VEIN, WITH CORONARY ENDARTERECTOMY (N/A) TRANSESOPHAGEAL ECHOCARDIOGRAM (TEE) (N/A) ENDARTERECTOMY CAROTID (Right)  1. CV- remains hemodynamically stable, HTN at times- continue Coreg, will resume home Lisinopril at 5mg  daily, Plavix for cryovein... Watch creatinine 2. Pulm- on trach, down to 28% support, weaning as tolerated 3. Renal- creatinine stable at 1.22, weight is at baseline, continue Lasix for now 4. Dysphagia- SLP following, continue current diet recommendations 5. Deconditioning- needs SNF vs. CIR at discharge 6. Dispo- patient stable, restart home ACE for HTN, continue trach wean, continue current caer   LOS: 24 days    BARRETT, ERIN 11/09/2015

## 2015-11-09 NOTE — Progress Notes (Addendum)
Nutrition Follow-up  DOCUMENTATION CODES:   Obesity unspecified  INTERVENTION:    Mighty Shake II TID with meals, each supplement provides 480-500 kcals and 20-23 gm protein  NUTRITION DIAGNOSIS:   Inadequate oral intake now related to dysphagia as evidenced by meal completion < 50%, ongoing  GOAL:   Patient will meet greater than or equal to 90% of their needs, progressing  MONITOR:   PO intake, Supplement acceptance, Labs, Weight trends, I & O's  ASSESSMENT:   Tyler Le is a 70 y.o. male, who presents for evaluation of bilateral internal carotid artery stenosis. He is scheduled for a redo CABG on Friday with Dr. Donata ClayVan Trigt for severe recurrent three vessel CAD. He previously had a CABG in 2001. He underwent cardiac cath on 10/16/15 due to unstable angina and was admitted afterwards.   Patient s/p MBSS 5/16. Diet advanced to Dys 2, nectar thick liquids. CORTRAK feeding tube & TPN discontinued. PO intake variable at 30-100% per flowsheet records. Would benefit from addition of oral nutrition supplements >> RD to order.  Diet Order:  DIET DYS 2 Room service appropriate?: Yes; Fluid consistency:: Nectar Thick  Skin:  Reviewed, no issues  Last BM:  5/16  Height:   Ht Readings from Last 1 Encounters:  10/28/15 6\' 4"  (1.93 m)    Weight:   Wt Readings from Last 1 Encounters:  11/09/15 275 lb (124.739 kg)    Ideal Body Weight:  91.8 kg  BMI:  Body mass index is 33.49 kg/(m^2).  Estimated Nutritional Needs:   Kcal:  2300-2500  Protein:  140-150 gm  Fluid:  per MD  EDUCATION NEEDS:   No education needs identified at this time  Maureen ChattersKatie Kaitlinn Iversen, RD, LDN Pager #: [phone] After-Hours Pager #: [phone]

## 2015-11-09 NOTE — Progress Notes (Signed)
Inpatient Rehabilitation  [facility] an insurance [facility].  Notified patient, spouse, and RN CM.  Will sign off at this time.  Please call with questions.   Charlane FerrettiMelissa Orvin Netter, M.A., CCC/SLP Admission Coordinator  [facility]  Cell [phone]

## 2015-11-10 LAB — CBC WITH DIFFERENTIAL/PLATELET
Basophils Absolute: 0 10*3/uL (ref 0.0–0.1)
Basophils Relative: 0 %
Eosinophils Absolute: 0.2 10*3/uL (ref 0.0–0.7)
Eosinophils Relative: 3 %
HCT: 32 % — ABNORMAL LOW (ref 39.0–52.0)
HEMOGLOBIN: 9.6 g/dL — AB (ref 13.0–17.0)
LYMPHS ABS: 1.3 10*3/uL (ref 0.7–4.0)
LYMPHS PCT: 22 %
MCH: 28 pg (ref 26.0–34.0)
MCHC: 30 g/dL (ref 30.0–36.0)
MCV: 93.3 fL (ref 78.0–100.0)
Monocytes Absolute: 0.9 10*3/uL (ref 0.1–1.0)
Monocytes Relative: 15 %
NEUTROS ABS: 3.6 10*3/uL (ref 1.7–7.7)
NEUTROS PCT: 60 %
PLATELETS: 278 10*3/uL (ref 150–400)
RBC: 3.43 MIL/uL — AB (ref 4.22–5.81)
RDW: 15.6 % — ABNORMAL HIGH (ref 11.5–15.5)
WBC: 6 10*3/uL (ref 4.0–10.5)

## 2015-11-10 LAB — BASIC METABOLIC PANEL
ANION GAP: 10 (ref 5–15)
BUN: 24 mg/dL — AB (ref 6–20)
CALCIUM: 9 mg/dL (ref 8.9–10.3)
CO2: 31 mmol/L (ref 22–32)
Chloride: 102 mmol/L (ref 101–111)
Creatinine, Ser: 1.33 mg/dL — ABNORMAL HIGH (ref 0.61–1.24)
GFR calc Af Amer: >60 mL/min (ref >60)
GFR, EST NON AFRICAN AMERICAN: 52 mL/min — AB (ref >60)
GLUCOSE: 65 mg/dL (ref 65–99)
POTASSIUM: 3.8 mmol/L (ref 3.5–5.1)
SODIUM: 143 mmol/L (ref 135–145)

## 2015-11-10 LAB — GLUCOSE, CAPILLARY
Glucose-Capillary: 104 mg/dL — ABNORMAL HIGH (ref 65–99)
Glucose-Capillary: 122 mg/dL — ABNORMAL HIGH (ref 65–99)
Glucose-Capillary: 137 mg/dL — ABNORMAL HIGH (ref 65–99)
Glucose-Capillary: 154 mg/dL — ABNORMAL HIGH (ref 65–99)
Glucose-Capillary: 59 mg/dL — ABNORMAL LOW (ref 65–99)
Glucose-Capillary: 86 mg/dL (ref 65–99)

## 2015-11-10 NOTE — Clinical Social Work Note (Signed)
Clinical Social Work Assessment  Patient Details  Name: Tyler Le MRN: [id number] Date of Birth: [birth year]  Date of referral:  11/10/15               Reason for consult:  Discharge Planning                Permission sought to share information with:  Facility Medical sales representativeContact Representative, Family Supports Permission granted to share information::  Yes, Verbal Permission Granted  Name::     Brewing technologistVickie Edwards  Agency::  SNFs  Relationship::  Wife  Contact Information:     Housing/Transportation Living arrangements for the past 2 months:  Single Family Home Source of Information:  Patient, Spouse Patient Interpreter Needed:  None Criminal Activity/Legal Involvement Pertinent to Current Situation/Hospitalization:  No - Comment as needed Significant Relationships:  Spouse Lives with:  Spouse Do you feel safe going back to the place where you live?  Yes Need for family participation in patient care:  No (Coment)  Care giving concerns: The patient insurance denied inpatient rehab stay. Patient's wife would like the patient to rebuild  his strength to return home.   Social Worker assessment / plan:  CSW called patient's wife.  CSW explained PT recommendation for CIR placement and that CIR placement was denied.   CSW explained SNF search and placement process to the patient's wife and answered her questions.  CSW will follow up with bed offers.  Employment status:  Retired Database administratornsurance information:  Managed Medicare PT Recommendations:  Inpatient Rehab Consult Information / Referral to community resources:  Skilled Nursing Facility  Patient/Family's Response to care:  The patient's wife is happy with the care the patient has received.   Patient/Family's Understanding of and Emotional Response to Diagnosis, Current Treatment, and Prognosis: The patient's wife has a good understanding of why the patient was admitted.  She understands the care plan and what the patient will need post  discharge. Emotional Assessment Appearance:  Other (Comment Required (Unable to Assess;) Attitude/Demeanor/Rapport:  Unable to Assess Affect (typically observed):  Unable to Assess Orientation:  Oriented to Self, Oriented to Place, Oriented to  Time, Oriented to Situation Alcohol / Substance use:  Not Applicable Psych involvement (Current and /or in the community):  No (Comment)  Discharge Needs  Concerns to be addressed:  Discharge Planning Concerns Readmission within the last 30 days:  No Current discharge risk:  Physical Impairment Barriers to Discharge:  Continued Medical Work up   Electronic Data SystemsLaShonda A Hurley Blevins, LCSW 11/10/2015, 12:33 PM

## 2015-11-10 NOTE — Progress Notes (Signed)
CARDIAC REHAB PHASE I   PRE:  Rate/Rhythm: 94SR  BP:  Supine:   Sitting: 122/64  Standing:    SaO2: 95% 28% trach  MODE:  Ambulation: 150 ft   POST:  Rate/Rhythm: 103  BP:  Supine:   Sitting: 150/74  Standing:    SaO2: 93% 28% trach 1025-1055 Second walk today. Pt walked 150 ft on 28% trach collar, rollator and asst x 2. Sat twice to rest due to leg and back pain. Needs reminding to lock rollator prior to sitting. Can be asst x1 with rollator. Very motivated to walk. Wife in room.   Tyler Nuttingharlene Neizan Debruhl, RN BSN  11/10/2015 10:52 AM

## 2015-11-10 NOTE — Progress Notes (Signed)
PT Cancellation Note  Patient Details Name: Tyler Le MRN: [id number] DOB: [birth year]   Cancelled Treatment:    Reason Eval/Treat Not Completed: Other (comment) ("I walked this am twice.  I am not walking again until 4-5pm).  Spoke with nursing who will provide walk to pt after 5pm.  Thanks.    [name], [name] 11/10/2015, 2:23 PM [name],PT Acute Rehabilitation [phone] (pager)

## 2015-11-10 NOTE — Progress Notes (Signed)
Inpatient Diabetes Program Recommendations  AACE/ADA: New Consensus Statement on Inpatient Glycemic Control (2015)  Target Ranges:  Prepandial:   less than 140 mg/dL      Peak postprandial:   less than 180 mg/dL (1-2 hours)      Critically ill patients:  140 - 180 mg/dL   Review of Glycemic Control:  Results for Tyler Le, Tyler Le (MRN [id number]) as of 11/10/2015 13:53  Ref. Range 11/09/2015 20:07 11/10/2015 00:15 11/10/2015 04:18 11/10/2015 07:58 11/10/2015 11:09  Glucose-Capillary Latest Ref Range: 65-99 mg/dL 914160 (H) 86 59 (L) 782104 (H) 122 (H)   Diabetes history: Type 2 diabetes Outpatient Diabetes medications:  Lantus 64 units q HS, Novolog 50 units q AM and 40 units q PM Current orders for Inpatient glycemic control:  Novolog resistant q 4 hours, Levemir 35 units bid  Inpatient Diabetes Program Recommendations:    Please consider reducing Lantus to 28 units bid and change Novolog correction to tid with meals and HS (instead of q 4 hours).  Thanks, Beryl MeagerJenny Feliz Lincoln, RN, BC-ADM Inpatient Diabetes Coordinator Pager [phone] (8a-5p)

## 2015-11-10 NOTE — Progress Notes (Addendum)
      [address]       [place] [id number]             [phone]      21 Days Post-Op Procedure(s) (LRB): REDO CORONARY ARTERY BYPASS GRAFTING (CABG), TIMES THREE, USING LEFT GREATER SAPHENOUS VEIN HARVESTED ENDOSCOPICALLY AND CRYO VEIN, WITH CORONARY ENDARTERECTOMY (N/A) TRANSESOPHAGEAL ECHOCARDIOGRAM (TEE) (N/A) ENDARTERECTOMY CAROTID (Right)   Subjective:  [name] has no complaints this morning.  He states he wants to get up and walk.  He had diarrhea yesterday which has resolved.  Objective: Vital signs in last 24 hours: Temp:  [97.8 F (36.6 C)-98.2 F (36.8 C)] 98.2 F (36.8 C) (05/19 0512) Pulse Rate:  [87-102] 100 (05/19 0801) Cardiac Rhythm:  [-] Normal sinus rhythm;Bundle branch block (05/18 1900) Resp:  [16-20] 18 (05/19 0512) BP: (125-146)/(65-71) 138/69 mmHg (05/19 0512) SpO2:  [92 %-95 %] 92 % (05/19 0512) FiO2 (%):  [28 %] 28 % (05/19 0512) Weight:  [277 lb (125.646 kg)] 277 lb (125.646 kg) (05/19 0512)  Intake/Output from previous day: 05/18 0701 - 05/19 0700 In: 750 [P.O.:720; I.V.:30] Out: 100 [Urine:100]  General appearance: alert, cooperative and no distress Heart: regular rate and rhythm Lungs: clear to auscultation bilaterally Abdomen: soft, non-tender; bowel sounds normal; no masses,  no organomegaly Extremities: edema trace Wound: clean and dry  Lab Results:  Recent Labs  11/09/15 0435 11/10/15 0445  WBC 4.9 6.0  HGB 9.2* 9.6*  HCT 30.3* 32.0*  PLT 287 278   BMET:  Recent Labs  11/09/15 0435 11/10/15 0445  NA 140 143  K 4.0 3.8  CL 100* 102  CO2 31 31  GLUCOSE 78 65  BUN 24* 24*  CREATININE 1.22 1.33*  CALCIUM 8.8* 9.0    PT/INR: No results for input(s): LABPROT, INR in the last 72 hours. ABG    Component Value Date/Time   PHART 7.305* 10/28/2015 0527   HCO3 34.6* 10/28/2015 0527   TCO2 31 11/03/2015 1840   ACIDBASEDEF 1.0 10/20/2015 1824   O2SAT 67.0 10/31/2015 0345   CBG (last 3)   Recent  Labs  11/10/15 0015 11/10/15 0418 11/10/15 0758  GLUCAP 86 59* 104*    Assessment/Plan: S/P Procedure(s) (LRB): REDO CORONARY ARTERY BYPASS GRAFTING (CABG), TIMES THREE, USING LEFT GREATER SAPHENOUS VEIN HARVESTED ENDOSCOPICALLY AND CRYO VEIN, WITH CORONARY ENDARTERECTOMY (N/A) TRANSESOPHAGEAL ECHOCARDIOGRAM (TEE) (N/A) ENDARTERECTOMY CAROTID (Right)  1. CV- remains hemodynamically stable- continue Coreg, Plavix, 2. Pulm- on trach, weaning as tolerated, remains on 28% support 3. Renal- creatinine at 1.30, increased from yesterday, will continue diuretics, recheck BMET in AM 4.  Dysphagia- SLP following, continue current diet 5. Deconditioning- consult placed to CSW, CIR not an option due to insurance 6. Dispo- patient stable, wean trach collar as tolerated, repeat BMET, continue current care  LOS: 25 days    BARRETT, ERIN 11/10/2015  Plan transfer to skilled nursing facility that except tracheostomy on Monday patient examined and medical record reviewed,agree with above note. Kathlee Nationseter Van Trigt III 11/10/2015

## 2015-11-10 NOTE — NC FL2 (Signed)
[place] MEDICAID FL2 LEVEL OF CARE SCREENING TOOL     IDENTIFICATION  Patient Name: Tyler Le Birthdate: 11/18/1945 Sex: male Admission Date (Current Location): 10/16/2015  [facility] and [place] Number:  Producer, television/film/video and Address:  The . [facility], [address], [place], [place] [address]      Provider Number: [id number]  Attending Physician Name and Address:  Kerin Perna, MD  Relative Name and Phone Number:       Current Level of Care: Hospital Recommended Level of Care: Skilled Nursing Facility Prior Approval Number:    Date Approved/Denied:   PASRR Number:    Discharge Plan: SNF    Current Diagnoses: Patient Active Problem List   Diagnosis Date Noted  . Ileus (HCC)   . S/P CABG x 4   . Surgery, elective   . Tracheostomy in place [facility])   . Debility   . Benign essential HTN   . Adjustment disorder with mixed anxiety and depressed mood   . Obesity   . Chronic low back pain   . OSA (obstructive sleep apnea)   . Dysphagia   . Tachycardia   . Tachypnea   . Acute blood loss anemia   . Left lower lobe pneumonia   . Tracheostomy care (HCC)   . Atelectasis   . Feeding tube dysfunction   . Acute respiratory failure with hypoxemia (HCC)   . S/P CABG x 3 10/20/2015  . Altered mental status   . Coronary artery disease involving coronary bypass graft of native heart with unspecified angina pectoris   . Coronary artery disease involving native coronary artery of native heart without angina pectoris   . CKD (chronic kidney disease), stage III 10/17/2015  . Unstable angina (HCC) 10/16/2015  . Accelerating angina (HCC)   . Acute kidney injury (HCC) 08/02/2014  . AKI (acute kidney injury) (HCC) 08/01/2014  . Renal failure 08/01/2014  . Gastroenteritis, acute 08/01/2014  . Diabetes mellitus type 2 in obese (HCC) 08/01/2014  . Abnormal nuclear stress test 07/11/2014  . Hypertension   . Essential hypertension   . Depression  05/08/2011  . CAD (coronary artery disease) 01/15/2011  . S/P CABG (coronary artery bypass graft) 10/03/2010  . Angina pectoris 10/03/2010  . Type II diabetes mellitus (HCC) 10/03/2010  . Dyslipidemia 10/03/2010  . Low back pain 10/03/2010  . Anxiety 10/03/2010  . Benign hypertensive heart disease without heart failure 10/03/2010    Orientation RESPIRATION BLADDER Height & Weight     Self, Time, Situation, Place  Tracheostomy (uncuffed Shiley 78mm 28%) No Suctioning. Patient has not needed suctioning  Continent Weight: 277 lb (125.646 kg) Height:  6\' 4"  (193 cm)  BEHAVIORAL SYMPTOMS/MOOD NEUROLOGICAL BOWEL NUTRITION STATUS   (None)  (None) Continent Diet (DYS2)  AMBULATORY STATUS COMMUNICATION OF NEEDS Skin   Extensive Assist Verbally Surgical wounds (Incision Closed: LT Leg Lateral Mid, Sternum Anterior Mid, RT Neck)                       Personal Care Assistance Level of Assistance  Bathing, Feeding, Dressing Bathing Assistance: Limited assistance Feeding assistance: Independent Dressing Assistance: Limited assistance     Functional Limitations Info  Sight, Hearing, Speech Sight Info: Impaired Hearing Info: Adequate Speech Info: Adequate    SPECIAL CARE FACTORS FREQUENCY                       Contractures Contractures Info: Not present  Additional Factors Info  Code Status, Allergies, Psychotropic, Insulin Sliding Scale Code Status Info: FULL Allergies Info: Metformin And Related, Pravachol, Ramipril, Wellbutrin Bupropion Hcl Psychotropic Info: Buspar 15 mg daily Insulin Sliding Scale Info: Novolog 0-20 units every 4 hours       Current Medications (11/10/2015):  This is the current hospital active medication list Current Facility-Administered Medications  Medication Dose Route Frequency Provider Last Rate Last Dose  . 0.45 % sodium chloride infusion   Intravenous Continuous Raylene Miyamoto, MD 10 mL/hr at 10/24/15 1900    . 0.9 %  sodium  chloride infusion  250 mL Intravenous Continuous Erin R Barrett, PA-C 1 mL/hr at 11/06/15 1900 250 mL at 11/06/15 1900  . 0.9 %  sodium chloride infusion   Intravenous Continuous Erin R Barrett, PA-C 10 mL/hr at 11/04/15 0513    . antiseptic oral rinse solution (CORINZ)  7 mL Mouth Rinse QID Ivin Poot, MD   7 mL at 11/10/15 0424  . aspirin EC tablet 325 mg  325 mg Oral Daily Erin R Barrett, PA-C   325 mg at 11/09/15 1006   Or  . aspirin chewable tablet 324 mg  324 mg Per Tube Daily Erin R Barrett, PA-C   324 mg at 11/08/15 0915  . bisacodyl (DULCOLAX) EC tablet 10 mg  10 mg Oral Daily Erin R Barrett, PA-C   10 mg at 11/09/15 1002   Or  . bisacodyl (DULCOLAX) suppository 10 mg  10 mg Rectal Daily Erin R Barrett, PA-C   10 mg at 10/25/15 1024  . busPIRone (BUSPAR) tablet 15 mg  15 mg Oral Daily Jettie Booze, MD   15 mg at 11/09/15 1002  . carvedilol (COREG) tablet 12.5 mg  12.5 mg Oral BID WC Ivin Poot, MD   12.5 mg at 11/10/15 0801  . chlorhexidine gluconate (SAGE KIT) (PERIDEX) 0.12 % solution 15 mL  15 mL Mouth Rinse BID Ivin Poot, MD   15 mL at 11/10/15 0800  . clopidogrel (PLAVIX) tablet 75 mg  75 mg Oral Daily Ivin Poot, MD   75 mg at 11/09/15 1002  . docusate (COLACE) 50 MG/5ML liquid 200 mg  200 mg Oral BID Lauren D Bajbus, RPH   200 mg at 11/09/15 1017  . DULoxetine (CYMBALTA) DR capsule 60 mg  60 mg Oral Daily Jettie Booze, MD   60 mg at 11/09/15 1003  . enoxaparin (LOVENOX) injection 40 mg  40 mg Subcutaneous Q24H Ivin Poot, MD   40 mg at 11/09/15 1004  . ezetimibe (ZETIA) tablet 10 mg  10 mg Oral Daily Rogelia Mire, NP   10 mg at 11/09/15 1002  . feeding supplement (VITAL HIGH PROTEIN) liquid 1,000 mL  1,000 mL Per Tube Continuous Ivin Poot, MD   Stopped at 11/07/15 0900  . fentaNYL (SUBLIMAZE) injection 25 mcg  25 mcg Intravenous Q2H PRN Marijean Heath, NP   25 mcg at 11/07/15 2951  . furosemide (LASIX) tablet 40 mg  40 mg  Oral Daily Ivin Poot, MD   40 mg at 11/09/15 1003  . Gerhardt's butt cream   Topical PRN Ivin Poot, MD      . hydrALAZINE (APRESOLINE) injection 10 mg  10 mg Intravenous Q4H PRN Gaye Pollack, MD   10 mg at 11/03/15 0325  . HYDROcodone-acetaminophen (NORCO) 10-325 MG per tablet 1 tablet  1 tablet Oral Q6H PRN Marijean Heath, NP  1 tablet at 11/09/15 1509  . insulin aspart (novoLOG) injection 0-20 Units  0-20 Units Subcutaneous Q4H Melrose Nakayama, MD   4 Units at 11/09/15 2025  . insulin detemir (LEVEMIR) injection 35 Units  35 Units Subcutaneous BID Ivin Poot, MD   35 Units at 11/09/15 2134  . lactated ringers infusion   Intravenous Continuous Erin R Barrett, PA-C      . lactated ringers infusion   Intravenous Continuous Erin R Barrett, PA-C 10 mL/hr at 10/25/15 0700    . levalbuterol (XOPENEX) nebulizer solution 0.63 mg  0.63 mg Nebulization Q6H PRN Ivin Poot, MD      . linaclotide [facility]) capsule 145 mcg  145 mcg Oral Daily Jettie Booze, MD   145 mcg at 11/09/15 1003  . Liraglutide SOPN 1.2 mg  1.2 mg Subcutaneous Daily Jettie Booze, MD   1.2 mg at 11/09/15 1146  . lisinopril (PRINIVIL,ZESTRIL) tablet 5 mg  5 mg Oral Daily Erin R Barrett, PA-C   5 mg at 11/09/15 1002  . mirtazapine (REMERON) tablet 15 mg  15 mg Oral QHS Ivin Poot, MD   15 mg at 11/09/15 2133  . ondansetron (ZOFRAN) injection 4 mg  4 mg Intravenous Q6H PRN Jettie Booze, MD      . ondansetron [facility]) injection 4 mg  4 mg Intravenous Q6H PRN Erin R Barrett, PA-C   4 mg at 10/31/15 0859  . oxymetazoline (AFRIN) 0.05 % nasal spray 2 spray  2 spray Each Nare BID Ivin Poot, MD   2 spray at 11/09/15 2133  . pantoprazole sodium (PROTONIX) 40 mg/20 mL oral suspension 40 mg  40 mg Per Tube Q1200 Ivin Poot, MD   40 mg at 11/09/15 1208  . potassium chloride 10 mEq in 50 mL *CENTRAL LINE* IVPB  10 mEq Intravenous Q1H PRN Ivin Poot, MD      . potassium  chloride 20 MEQ/15ML (10%) solution 20 mEq  20 mEq Oral Daily Ivin Poot, MD   20 mEq at 11/09/15 1017  . QUEtiapine (SEROQUEL) tablet 150 mg  150 mg Oral QHS Ivin Poot, MD   150 mg at 11/09/15 2132  . RESOURCE THICKENUP CLEAR   Oral PRN Ivin Poot, MD      . sodium chloride flush (NS) 0.9 % injection 10-40 mL  10-40 mL Intracatheter PRN Jettie Booze, MD   30 mL at 11/09/15 1758  . sodium chloride flush (NS) 0.9 % injection 10-40 mL  10-40 mL Intracatheter Q12H Mal Misty, MD   10 mL at 11/08/15 0916  . sodium chloride flush (NS) 0.9 % injection 3 mL  3 mL Intravenous Q12H Erin R Barrett, PA-C   3 mL at 11/08/15 2106  . traMADol (ULTRAM) tablet 50-100 mg  50-100 mg Oral Q4H PRN Erin R Barrett, PA-C   100 mg at 11/09/15 2005     Discharge Medications: Please see discharge summary for a list of discharge medications.  Relevant Imaging Results:  Relevant Lab Results:   Additional Information RFV:[id number]  Samule Dry, LCSW

## 2015-11-11 LAB — BASIC METABOLIC PANEL WITH GFR
Anion gap: 11 (ref 5–15)
BUN: 25 mg/dL — ABNORMAL HIGH (ref 6–20)
CO2: 30 mmol/L (ref 22–32)
Calcium: 9 mg/dL (ref 8.9–10.3)
Chloride: 100 mmol/L — ABNORMAL LOW (ref 101–111)
Creatinine, Ser: 1.38 mg/dL — ABNORMAL HIGH (ref 0.61–1.24)
GFR calc Af Amer: 57 mL/min — ABNORMAL LOW
GFR calc non Af Amer: 50 mL/min — ABNORMAL LOW
Glucose, Bld: 114 mg/dL — ABNORMAL HIGH (ref 65–99)
Potassium: 3.9 mmol/L (ref 3.5–5.1)
Sodium: 141 mmol/L (ref 135–145)

## 2015-11-11 LAB — GLUCOSE, CAPILLARY
Glucose-Capillary: 110 mg/dL — ABNORMAL HIGH (ref 65–99)
Glucose-Capillary: 110 mg/dL — ABNORMAL HIGH (ref 65–99)
Glucose-Capillary: 122 mg/dL — ABNORMAL HIGH (ref 65–99)
Glucose-Capillary: 135 mg/dL — ABNORMAL HIGH (ref 65–99)
Glucose-Capillary: 146 mg/dL — ABNORMAL HIGH (ref 65–99)
Glucose-Capillary: 147 mg/dL — ABNORMAL HIGH (ref 65–99)
Glucose-Capillary: 157 mg/dL — ABNORMAL HIGH (ref 65–99)

## 2015-11-11 MED ORDER — AMLODIPINE BESYLATE 5 MG PO TABS
5.0000 mg | ORAL_TABLET | Freq: Every day | ORAL | Status: DC
  Administered 2015-11-11 – 2015-11-14 (×4): 5 mg via ORAL
  Filled 2015-11-11 (×4): qty 1

## 2015-11-11 NOTE — Progress Notes (Signed)
Pt trach noted to be loose in neck. RN attempted to reinsert trach, met resistance and stopped. Called RT, per RT this was noted in the report she received, but she agreed to reassess. Spoke with cardiac rehab nurse regarding her concerns. Spoke with Jadene Pierini PA and Dr. Cyndia Bent - they agreed to assess. Pt in no respiratory distress. Will continue to monitor.   Fritz Pickerel, RN

## 2015-11-11 NOTE — Progress Notes (Signed)
Tyler Le out.  And pt is off O2.  Sats 92

## 2015-11-11 NOTE — Progress Notes (Signed)
CARDIAC REHAB PHASE I   PRE:  Rate/Rhythm: 100 SR  BP:  Sitting: 132/60      SaO2: 94 28% TC  MODE:  Ambulation: 300 ft   POST:  Rate/Rhythm: 106 ST  BP:  Sitting: 185/86     SaO2: 94 28% TC 1030-1130 Patient sitting up in chair asleep. Willing to walk. Trache suction and gown changed prior to walk. It is noted that trache appears to be partially out. Attempted to gently advance and tighten trache ties. Unable to advance. Vitals stable. Patient able to clearly speak without passy valve. RT notified and in to assess trache position. States trache is OK as long as patient is OK. It is also noted that patient is eating and drinking without passy valve. Per speech note, patient to wear passy valve while awake, eating, and drinking. Primary RN made aware. Patient stated he has been eating and drinking without it. Wife unaware of any "rules". LemayWayne, [place] notified and in to see trache. Patient did ambulate in hallway x 1 assist with Rollator. Steady gait noted. 4 sitting rest breaks for generalized weakness. Patient needs reminding to lock breaks on rollator before sitting. Post ambulation patient back to chair with call bell and phone in reach. Wife at bedside.  Vardaan Depascale English PayneRN, BSN 11/11/2015 11:52 AM

## 2015-11-11 NOTE — Progress Notes (Addendum)
    [address]       [place] [address]             [phone]      22 Days Post-Op Procedure(s) (LRB): REDO CORONARY ARTERY BYPASS GRAFTING (CABG), TIMES THREE, USING LEFT GREATER SAPHENOUS VEIN HARVESTED ENDOSCOPICALLY AND CRYO VEIN, WITH CORONARY ENDARTERECTOMY (N/A) TRANSESOPHAGEAL ECHOCARDIOGRAM (TEE) (N/A) ENDARTERECTOMY CAROTID (Right) Subjective: Feels ok, no new c/o  Objective: Vital signs in last 24 hours: Temp:  [98 F (36.7 C)-98.7 F (37.1 C)] 98 F (36.7 C) (05/20 0441) Pulse Rate:  [94-99] 96 (05/20 0441) Cardiac Rhythm:  [-] Normal sinus rhythm (05/20 0700) Resp:  [14-18] 16 (05/20 0441) BP: (122-156)/(62-77) 123/62 mmHg (05/20 0441) SpO2:  [94 %-97 %] 94 % (05/20 0752) FiO2 (%):  [28 %] 28 % (05/20 0752)  Hemodynamic parameters for last 24 hours:    Intake/Output from previous day: 05/19 0701 - 05/20 0700 In: 480 [P.O.:480] Out: 625 [Urine:625] Intake/Output this shift:    General appearance: alert, cooperative and no distress Heart: regular rate and rhythm Lungs: some upper airway rinchi- improves with cough Abdomen: soft, nontender Extremities: + le edema Wound: incis healing well  Lab Results:  Recent Labs  11/09/15 0435 11/10/15 0445  WBC 4.9 6.0  HGB 9.2* 9.6*  HCT 30.3* 32.0*  PLT 287 278   BMET:  Recent Labs  11/10/15 0445 11/11/15 0400  NA 143 141  K 3.8 3.9  CL 102 100*  CO2 31 30  GLUCOSE 65 114*  BUN 24* 25*  CREATININE 1.33* 1.38*  CALCIUM 9.0 9.0    PT/INR: No results for input(s): LABPROT, INR in the last 72 hours. ABG    Component Value Date/Time   PHART 7.305* 10/28/2015 0527   HCO3 34.6* 10/28/2015 0527   TCO2 31 11/03/2015 1840   ACIDBASEDEF 1.0 10/20/2015 1824   O2SAT 67.0 10/31/2015 0345   CBG (last 3)   Recent Labs  11/10/15 2103 11/11/15 0030 11/11/15 0439  GLUCAP 154* 157* 110*    Meds Scheduled Meds: . antiseptic oral rinse  7 mL Mouth Rinse QID  . aspirin EC   325 mg Oral Daily   Or  . aspirin  324 mg Per Tube Daily  . bisacodyl  10 mg Oral Daily   Or  . bisacodyl  10 mg Rectal Daily  . busPIRone  15 mg Oral Daily  . carvedilol  12.5 mg Oral BID WC  . chlorhexidine gluconate (SAGE KIT)  15 mL Mouth Rinse BID  . clopidogrel  75 mg Oral Daily  . docusate  200 mg Oral BID  . DULoxetine  60 mg Oral Daily  . enoxaparin (LOVENOX) injection  40 mg Subcutaneous Q24H  . ezetimibe  10 mg Oral Daily  . furosemide  40 mg Oral Daily  . insulin aspart  0-20 Units Subcutaneous Q4H  . insulin detemir  35 Units Subcutaneous BID  . linaclotide  145 mcg Oral Daily  . Liraglutide  1.2 mg Subcutaneous Daily  . lisinopril  5 mg Oral Daily  . mirtazapine  15 mg Oral QHS  . oxymetazoline  2 spray Each Nare BID  . pantoprazole sodium  40 mg Per Tube Q1200  . potassium chloride  20 mEq Oral Daily  . QUEtiapine  150 mg Oral QHS  . sodium chloride flush  10-40 mL Intracatheter Q12H  . sodium chloride flush  3 mL Intravenous Q12H   Continuous Infusions: . sodium chloride 10 mL/hr   at 10/24/15 1900  . sodium chloride 250 mL (11/06/15 1900)  . sodium chloride 10 mL/hr at 11/04/15 0513  . feeding supplement (VITAL HIGH PROTEIN) Stopped (11/07/15 0900)  . lactated ringers    . lactated ringers 10 mL/hr at 10/25/15 0700   PRN Meds:.fentaNYL (SUBLIMAZE) injection, Gerhardt's butt cream, hydrALAZINE, HYDROcodone-acetaminophen, levalbuterol, ondansetron (ZOFRAN) IV, ondansetron (ZOFRAN) IV, potassium chloride, RESOURCE THICKENUP CLEAR, sodium chloride flush, traMADol  Xrays No results found.  Assessment/Plan: S/P Procedure(s) (LRB): REDO CORONARY ARTERY BYPASS GRAFTING (CABG), TIMES THREE, USING LEFT GREATER SAPHENOUS VEIN HARVESTED ENDOSCOPICALLY AND CRYO VEIN, WITH CORONARY ENDARTERECTOMY (N/A) TRANSESOPHAGEAL ECHOCARDIOGRAM (TEE) (N/A) ENDARTERECTOMY CAROTID (Right)  1 creat slowly rising, will d/c ace inhib and add norvasc for HTN, cont gentle diuresis for  volume overload 2 SW is assisting with placement-  3 rehab as able 4 pulm toilet.trach - cont current rx 5 sugars adeq controlled   LOS: 26 days    GOLD,WAYNE E 11/11/2015   Chart reviewed, patient examined, agree with above. Tracheostomy tube was out of trachea and in the subcutaneous tissue this am. It could not be reinserted and he has been doing fine so I removed it. Vaseline gauze dressing placed over site. He will need reevaluation of swallowing early next week to see if he can go to thin liquids. 

## 2015-11-11 NOTE — Care Management Important Message (Signed)
Important Message  Patient Details  Name: Tyler Le MRN: [id number] Date of Birth: 1945-12-15   Medicare Important Message Given:  Yes    [name], [name] [name] 11/11/2015, 10:59 AM

## 2015-11-11 NOTE — Progress Notes (Signed)
Speech Language Pathology Treatment: Dysphagia  Patient Details Name: Tyler Le MRN: [id number] DOB: 09-01-1945 Today's Date: 11/11/2015 Time: [id number] SLP Time Calculation (min) (ACUTE ONLY): 15 min  Assessment / Plan / Recommendation Clinical Impression  Pt was extubated and MD requested pt be checked for diet upgrade. Pt given cracker and thin liquid while pt in chair to evaluate his swallow. Pt had no s/s of aspiration with either consistency. Reviewed compensatory strategies with patient and wife. Recommend pt diet to be upgraded to Regular diet with thin liquids and meds whole with liquids.    HPI HPI: pt presents with Cardiac Arrest and Redo of CABG x3. pt intubated 4/24 and with Trach on 5/5. pt with hx of CABG, Anxiety, HTN, DM, CAD, Renal Insufficiency, and Back Surgeries.       SLP Plan  Continue with current plan of care     Recommendations  Diet recommendations: Regular;Thin liquid Liquids provided via: Cup;Straw Medication Administration: Whole meds with liquid Supervision: Patient able to self feed Compensations: Slow rate Postural Changes and/or Swallow Maneuvers: Seated upright 90 degrees;Upright 30-60 min after meal      Patient may use Passy-Muir Speech Valve:  (Pt is now decannulated.)      Oral Care Recommendations: Oral care BID Follow up Recommendations: Inpatient Rehab Plan: Continue with current plan of care     GO               Lindalou HoseSarah J. Junnie Loschiavo, MA, CCC-SLP 11/11/2015 2:35 PM

## 2015-11-11 NOTE — Progress Notes (Signed)
Tracheostomy removed by Dr. Laneta SimmersBartle. Vaseline gauze, 2x2, and tape applied. Pt oxygen saturation 94% on room air. Will continue to monitor.   Leonidas Rombergaitlin S Bumbledare, RN

## 2015-11-12 LAB — GLUCOSE, CAPILLARY
Glucose-Capillary: 132 mg/dL — ABNORMAL HIGH (ref 65–99)
Glucose-Capillary: 163 mg/dL — ABNORMAL HIGH (ref 65–99)
Glucose-Capillary: 160 mg/dL — ABNORMAL HIGH (ref 65–99)
Glucose-Capillary: 114 mg/dL — ABNORMAL HIGH (ref 65–99)
Glucose-Capillary: 129 mg/dL — ABNORMAL HIGH (ref 65–99)

## 2015-11-12 LAB — BASIC METABOLIC PANEL
ANION GAP: 8 (ref 5–15)
BUN: 24 mg/dL — ABNORMAL HIGH (ref 6–20)
CALCIUM: 8.8 mg/dL — AB (ref 8.9–10.3)
CO2: 29 mmol/L (ref 22–32)
Chloride: 101 mmol/L (ref 101–111)
Creatinine, Ser: 1.34 mg/dL — ABNORMAL HIGH (ref 0.61–1.24)
GFR calc non Af Amer: 51 mL/min — ABNORMAL LOW (ref >60)
GFR, EST AFRICAN AMERICAN: 59 mL/min — AB (ref >60)
GLUCOSE: 126 mg/dL — AB (ref 65–99)
POTASSIUM: 3.9 mmol/L (ref 3.5–5.1)
Sodium: 138 mmol/L (ref 135–145)

## 2015-11-12 MED ORDER — WITCH HAZEL-GLYCERIN EX PADS
MEDICATED_PAD | CUTANEOUS | Status: DC | PRN
Start: 2015-11-12 — End: 2015-11-14
  Filled 2015-11-12 (×2): qty 100

## 2015-11-12 MED ORDER — INSULIN ASPART 100 UNIT/ML ~~LOC~~ SOLN
0.0000 [IU] | Freq: Three times a day (TID) | SUBCUTANEOUS | Status: DC
  Administered 2015-11-13 (×2): 3 [IU] via SUBCUTANEOUS
  Administered 2015-11-14: 4 [IU] via SUBCUTANEOUS
  Administered 2015-11-14: 3 [IU] via SUBCUTANEOUS

## 2015-11-12 MED ORDER — INSULIN ASPART 100 UNIT/ML ~~LOC~~ SOLN: 0.0000 [IU] | Freq: Every day | SUBCUTANEOUS | Status: DC

## 2015-11-12 NOTE — Progress Notes (Addendum)
[address]       [place] [address]             [phone]      23 Days Post-Op Procedure(s) (LRB): REDO CORONARY ARTERY BYPASS GRAFTING (CABG), TIMES THREE, USING LEFT GREATER SAPHENOUS VEIN HARVESTED ENDOSCOPICALLY AND CRYO VEIN, WITH CORONARY ENDARTERECTOMY (N/A) TRANSESOPHAGEAL ECHOCARDIOGRAM (TEE) (N/A) ENDARTERECTOMY CAROTID (Right) Subjective: conts to feel better, tolerating diet without cough,dysphagia  Objective: Vital signs in last 24 hours: Temp:  [98.1 F (36.7 C)-98.6 F (37 C)] 98.6 F (37 C) (05/21 0422) Pulse Rate:  [90-94] 94 (05/21 0422) Cardiac Rhythm:  [-] Normal sinus rhythm;Bundle branch block (05/21 0700) Resp:  [18] 18 (05/21 0422) BP: (119-127)/(54-64) 123/54 mmHg (05/21 0422) SpO2:  [91 %-94 %] 91 % (05/21 0422) FiO2 (%):  [28 %] 28 % (05/20 1102)  Hemodynamic parameters for last 24 hours:    Intake/Output from previous day: 05/20 0701 - 05/21 0700 In: 960 [P.O.:960] Out: 1150 [Urine:1150] Intake/Output this shift:    General appearance: alert, cooperative and no distress Heart: regular rate and rhythm Lungs: dim in bases Abdomen: mod dist, + BS, nontender Extremities: + min LE edema Wound: incis healing well  Lab Results:  Recent Labs  11/10/15 0445  WBC 6.0  HGB 9.6*  HCT 32.0*  PLT 278   BMET:  Recent Labs  11/11/15 0400 11/12/15 0525  NA 141 138  K 3.9 3.9  CL 100* 101  CO2 30 29  GLUCOSE 114* 126*  BUN 25* 24*  CREATININE 1.38* 1.34*  CALCIUM 9.0 8.8*    PT/INR: No results for input(s): LABPROT, INR in the last 72 hours. ABG    Component Value Date/Time   PHART 7.305* 10/28/2015 0527   HCO3 34.6* 10/28/2015 0527   TCO2 31 11/03/2015 1840   ACIDBASEDEF 1.0 10/20/2015 1824   O2SAT 67.0 10/31/2015 0345   CBG (last 3)   Recent Labs  11/11/15 2355 11/12/15 0415 11/12/15 0747  GLUCAP 147* 132* 163*    Meds Scheduled Meds: . amLODipine  5 mg Oral Daily  . antiseptic oral  rinse  7 mL Mouth Rinse QID  . aspirin EC  325 mg Oral Daily   Or  . aspirin  324 mg Per Tube Daily  . bisacodyl  10 mg Oral Daily   Or  . bisacodyl  10 mg Rectal Daily  . busPIRone  15 mg Oral Daily  . carvedilol  12.5 mg Oral BID WC  . chlorhexidine gluconate (SAGE KIT)  15 mL Mouth Rinse BID  . clopidogrel  75 mg Oral Daily  . docusate  200 mg Oral BID  . DULoxetine  60 mg Oral Daily  . enoxaparin (LOVENOX) injection  40 mg Subcutaneous Q24H  . ezetimibe  10 mg Oral Daily  . furosemide  40 mg Oral Daily  . insulin aspart  0-20 Units Subcutaneous Q4H  . insulin detemir  35 Units Subcutaneous BID  . linaclotide  145 mcg Oral Daily  . Liraglutide  1.2 mg Subcutaneous Daily  . mirtazapine  15 mg Oral QHS  . oxymetazoline  2 spray Each Nare BID  . pantoprazole sodium  40 mg Per Tube Q1200  . potassium chloride  20 mEq Oral Daily  . QUEtiapine  150 mg Oral QHS  . sodium chloride flush  10-40 mL Intracatheter Q12H  . sodium chloride flush  3 mL Intravenous Q12H   Continuous Infusions: . sodium chloride 10 mL/hr at  10/24/15 1900  . sodium chloride 250 mL (11/06/15 1900)  . sodium chloride 10 mL/hr at 11/04/15 0513  . feeding supplement (VITAL HIGH PROTEIN) Stopped (11/07/15 0900)  . lactated ringers    . lactated ringers 10 mL/hr at 10/25/15 0700   PRN Meds:.fentaNYL (SUBLIMAZE) injection, Gerhardt's butt cream, hydrALAZINE, HYDROcodone-acetaminophen, levalbuterol, ondansetron (ZOFRAN) IV, ondansetron (ZOFRAN) IV, potassium chloride, RESOURCE THICKENUP CLEAR, sodium chloride flush, traMADol  Xrays No results found.  Assessment/Plan: S/P Procedure(s) (LRB): REDO CORONARY ARTERY BYPASS GRAFTING (CABG), TIMES THREE, USING LEFT GREATER SAPHENOUS VEIN HARVESTED ENDOSCOPICALLY AND CRYO VEIN, WITH CORONARY ENDARTERECTOMY (N/A) TRANSESOPHAGEAL ECHOCARDIOGRAM (TEE) (N/A) ENDARTERECTOMY CAROTID (Right)  1 BP ok on norvasc, reat trending lower off ACEI 2 tolerating trach out 3  placement issues per SW- will be easier without trach 4 push nutrition/rehab/pulm toilet as able 5 sugars ok, change cbg to ac/hs 6 cont diuresis  LOS: 27 days    GOLD,WAYNE E 11/12/2015   Chart reviewed, patient examined, agree with above. He looks good overall. Taking regular diet and thin liquids now per ST.

## 2015-11-13 LAB — GLUCOSE, CAPILLARY
Glucose-Capillary: 92 mg/dL (ref 65–99)
Glucose-Capillary: 146 mg/dL — ABNORMAL HIGH (ref 65–99)
Glucose-Capillary: 125 mg/dL — ABNORMAL HIGH (ref 65–99)
Glucose-Capillary: 177 mg/dL — ABNORMAL HIGH (ref 65–99)

## 2015-11-13 MED ORDER — FUROSEMIDE 40 MG PO TABS: 40.0000 mg | ORAL_TABLET | Freq: Every day | ORAL | Status: DC

## 2015-11-13 MED ORDER — POTASSIUM CHLORIDE CRYS ER 20 MEQ PO TBCR
20.0000 meq | EXTENDED_RELEASE_TABLET | Freq: Every day | ORAL | Status: DC
Start: 2015-11-14 — End: 2015-11-14
  Administered 2015-11-14: 20 meq via ORAL
  Filled 2015-11-13: qty 1

## 2015-11-13 MED ORDER — CLOPIDOGREL BISULFATE 75 MG PO TABS: 75.0000 mg | ORAL_TABLET | Freq: Every day | ORAL | Status: DC

## 2015-11-13 MED ORDER — EZETIMIBE 10 MG PO TABS: 10.0000 mg | ORAL_TABLET | Freq: Every day | ORAL | Status: DC

## 2015-11-13 MED ORDER — ASPIRIN 325 MG PO TBEC: 325.0000 mg | DELAYED_RELEASE_TABLET | Freq: Every day | ORAL | Status: DC

## 2015-11-13 MED ORDER — HYDROCODONE-ACETAMINOPHEN 10-325 MG PO TABS: 1.0000 | ORAL_TABLET | Freq: Four times a day (QID) | ORAL | Status: AC | PRN

## 2015-11-13 MED ORDER — PANTOPRAZOLE SODIUM 40 MG PO TBEC
40.0000 mg | DELAYED_RELEASE_TABLET | Freq: Every day | ORAL | Status: DC
  Administered 2015-11-13 – 2015-11-14 (×2): 40 mg via ORAL
  Filled 2015-11-13 (×2): qty 1

## 2015-11-13 MED ORDER — METOLAZONE 5 MG PO TABS
5.0000 mg | ORAL_TABLET | Freq: Once | ORAL | Status: AC
  Administered 2015-11-13: 5 mg via ORAL
  Filled 2015-11-13: qty 1

## 2015-11-13 MED ORDER — POTASSIUM CHLORIDE 20 MEQ/15ML (10%) PO SOLN: 20.0000 meq | Freq: Every day | ORAL | Status: DC

## 2015-11-13 NOTE — Discharge Instructions (Signed)
Coronary Artery Bypass Grafting, Care After °Refer to this sheet in the next few weeks. These instructions provide you with information on caring for yourself after your procedure. Your health care provider may also give you more specific instructions. Your treatment has been planned according to current medical practices, but problems sometimes occur. Call your health care provider if you have any problems or questions after your procedure. °WHAT TO EXPECT AFTER THE PROCEDURE °Recovery from surgery will be different for everyone. Some people feel well after 3 or 4 weeks, while for others it takes longer. After your procedure, it is typical to have the following: °· Nausea and a lack of appetite.   °· Constipation. °· Weakness and fatigue.   °· Depression or irritability.   °· Pain or discomfort at your incision site. °HOME CARE INSTRUCTIONS °· Take medicines only as directed by your health care provider. Do not stop taking medicines or start any new medicines without first checking with your health care provider. °· Take your pulse as directed by your health care provider. °· Perform deep breathing as directed by your health care provider. If you were given a device called an incentive spirometer, use it to practice deep breathing several times a day. Support your chest with a pillow or your arms when you take deep breaths or cough. °· Keep incision areas clean, dry, and protected. Remove or change any bandages (dressings) only as directed by your health care provider. You may have skin adhesive strips over the incision areas. Do not take the strips off. They will fall off on their own. °· Check incision areas daily for any swelling, redness, or drainage. °· If incisions were made in your legs, do the following: °¨ Avoid crossing your legs.   °¨ Avoid sitting for long periods of time. Change positions every 30 minutes.   °¨ Elevate your legs when you are sitting. °· Wear compression stockings as directed by your  health care provider. These stockings help keep blood clots from forming in your legs. °· Take showers once your health care provider approves. Until then, only take sponge baths. Pat incisions dry. Do not rub incisions with a washcloth or towel. Do not take baths, swim, or use a hot tub until your health care provider approves. °· Eat foods that are high in fiber, such as raw fruits and vegetables, whole grains, beans, and nuts. Meats should be lean cut. Avoid canned, processed, and fried foods. °· Drink enough fluid to keep your urine clear or pale yellow. °· Weigh yourself every day. This helps identify if you are retaining fluid that may make your heart and lungs work harder. °· Rest and limit activity as directed by your health care provider. You may be instructed to: °¨ Stop any activity at once if you have chest pain, shortness of breath, irregular heartbeats, or dizziness. Get help right away if you have any of these symptoms. °¨ Move around frequently for short periods or take short walks as directed by your health care provider. Increase your activities gradually. You may need physical therapy or cardiac rehabilitation to help strengthen your muscles and build your endurance. °¨ Avoid lifting, pushing, or pulling anything heavier than 10 lb (4.5 kg) for at least 6 weeks after surgery. °· Do not drive until your health care provider approves.  °· Ask your health care provider when you may return to work. °· Ask your health care provider when you may resume sexual activity. °· Keep all follow-up visits as directed by your health care   provider. This is important. °SEEK MEDICAL CARE IF: °· You have swelling, redness, increasing pain, or drainage at the site of an incision. °· You have a fever. °· You have swelling in your ankles or legs. °· You have pain in your legs.   °· You gain 2 or more pounds (0.9 kg) a day. °· You are nauseous or vomit. °· You have diarrhea.  °SEEK IMMEDIATE MEDICAL CARE IF: °· You have  chest pain that goes to your jaw or arms. °· You have shortness of breath.   °· You have a fast or irregular heartbeat.   °· You notice a "clicking" in your breastbone (sternum) when you move.   °· You have numbness or weakness in your arms or legs. °· You feel dizzy or light-headed.   °MAKE SURE YOU: °· Understand these instructions. °· Will watch your condition. °· Will get help right away if you are not doing well or get worse. °  °This information is not intended to replace advice given to you by your health care provider. Make sure you discuss any questions you have with your health care provider. °  °Document Released: 12/28/2004 Document Revised: 07/01/2014 Document Reviewed: 11/17/2012 °Elsevier Interactive Patient Education ©2016 [employer] ° °Endoscopic Saphenous Vein Harvesting, Care After °Refer to this sheet in the next few weeks. These instructions provide you with information on caring for yourself after your procedure. Your health care provider may also give you more specific instructions. Your treatment has been planned according to current medical practices, but problems sometimes occur. Call your health care provider if you have any problems or questions after your procedure. °HOME CARE INSTRUCTIONS °Medicine °· Take whatever pain medicine your surgeon prescribes. Follow the directions carefully. Do not take over-the-counter pain medicine unless your surgeon says it is okay. Some pain medicine can cause bleeding problems for several weeks after surgery. °· Follow your surgeon's instructions about driving. You will probably not be permitted to drive after heart surgery. °· Take any medicines your surgeon prescribes. Any medicines you took before your heart surgery should be checked with your health care provider before you start taking them again. °Wound care °· If your surgeon has prescribed an elastic bandage or stocking, ask how long you should wear it. °· Check the area around your surgical  cuts (incisions) whenever your bandages (dressings) are changed. Look for any redness or swelling. °· You will need to return to have the stitches (sutures) or staples taken out. Ask your surgeon when to do that. °· Ask your surgeon when you can shower or bathe. °Activity °· Try to keep your legs raised when you are sitting. °· Do any exercises your health care providers have given you. These may include deep breathing exercises, coughing, walking, or other exercises. °SEEK MEDICAL CARE IF: °· You have any questions about your medicines. °· You have more leg pain, especially if your pain medicine stops working. °· New or growing bruises develop on your leg. °· Your leg swells, feels tight, or becomes red. °· You have numbness in your leg. °SEEK IMMEDIATE MEDICAL CARE IF: °· Your pain gets much worse. °· Blood or fluid leaks from any of the incisions. °· Your incisions become warm, swollen, or red. °· You have chest pain. °· You have trouble breathing. °· You have a fever. °· You have more pain near your leg incision. °MAKE SURE YOU: °· Understand these instructions. °· Will watch your condition. °· Will get help right away if you are not doing well or   get worse. °  °This information is not intended to replace advice given to you by your health care provider. Make sure you discuss any questions you have with your health care provider. °  °Document Released: 02/20/2011 Document Revised: 07/01/2014 Document Reviewed: 02/20/2011 °Elsevier Interactive Patient Education ©2016 Elsevier Inc. ° ° °

## 2015-11-13 NOTE — Progress Notes (Signed)
PT Cancellation Note  Patient Details Name: Tyler Le MRN: [id number] DOB: July 11, 1945   Cancelled Treatment:    Reason Eval/Treat Not Completed: Fatigue/lethargy limiting ability to participate;Other (comment) (Just walked with his wife despite PT having just come to see) him.  Will try later as time and pt allow.   Ivar DrapeStout, Erhard Senske E 11/13/2015, 11:54 AM    Tyler Le, PT MS Acute Rehab Dept. [facility]: [facility] [id number] and [facility] [phone]

## 2015-11-13 NOTE — Clinical Social Work Note (Signed)
LCSW met with patient and patient's wife at bedside to present bed offers. Patient's wife expressed interest in [facility]. LCSW contacted facility liaison who stated patient was initially denied due to medical condition. LCSW provided updated clinicals as patient is no longer on trach.  Unit LCSW to follow-up with [facility] admissions liaison on 11/14/2015.  Barrier to discharge: Awaiting PASARR confirmation. Appropriate documentation has been faxed to [facility], awaiting PASARR confirmation. Patient is not able to admit to SNF without PASARR.  Lubertha Sayres, [facility]: [phone] Surgical: [phone]

## 2015-11-13 NOTE — Progress Notes (Signed)
Speech Language Pathology Treatment: Dysphagia  Patient Details Name: REDELL NAZIR MRN: [id number] DOB: May 07, 1946 Today's Date: 11/13/2015 Time: [id number] SLP Time Calculation (min) (ACUTE ONLY): 8 min  Assessment / Plan / Recommendation Clinical Impression  F/u for dysphagia.  Pt has been decannulated and diet was upgraded to regular, thin liquids two days ago.  Lungs are clear; phonation is strong.  Pt is tolerating current diet with no s/s of aspiration.  Dysphagia has resolved.  No SLP f/u needed at SNF.  Continue current diet - our services will sign off.    HPI HPI: pt presents with Cardiac Arrest and Redo of CABG x3. pt intubated 4/24 and with Trach on 5/5. pt with hx of CABG, Anxiety, HTN, DM, CAD, Renal Insufficiency, and Back Surgeries.       SLP Plan  All goals met     Recommendations  Diet recommendations: Regular;Thin liquid Liquids provided via: Cup;Straw Medication Administration: Whole meds with liquid Supervision: Patient able to self feed             Oral Care Recommendations: Oral care BID Follow up Recommendations: None Plan: All goals met     GO                Juan Quam Laurice 11/13/2015, 2:25 PM

## 2015-11-13 NOTE — Progress Notes (Signed)
Occupational Therapy Treatment Patient Details Name: Tyler Le MRN: [id number] DOB: [birth year] Today's Date: 11/13/2015    History of present illness pt presents with Cardiac Arrest and Redo of CABG x3.  pt intubated 4/24 and with Trach on 5/5.  pt with hx of CABG, Anxiety, HTN, DM, CAD, Renal Insufficiency, and Back Surgeries.  Trach out 5/21.   OT comments  Pt able to stand and perform oral care. Continues to demonstrate poor standing balance and difficulty generalizing sternal precautions. Plan is now for SNF.  Follow Up Recommendations  SNF;Supervision/Assistance - 24 hour    Equipment Recommendations  None recommended by OT    Recommendations for Other Services      Precautions / Restrictions Precautions Precautions: Fall;Sternal Restrictions Weight Bearing Restrictions: Yes Other Position/Activity Restrictions: sternal precautions       Mobility Bed Mobility               General bed mobility comments: pt in chair  Transfers Overall transfer level: Needs assistance Equipment used: Rolling walker (2 wheeled) Transfers: Sit to/from Stand Sit to Stand: Min assist         General transfer comment: cues for UEs to knees and using momentum to come to standing, min assist and steady      Balance             Standing balance-[name] Scale: Poor Standing balance comment: required one hand on sink                   ADL Overall ADL's : Needs assistance/impaired     Grooming: Oral care;Standing;Minimal assistance Grooming Details (indicate cue type and reason): cues for sequencing, only tolerated one task             Lower Body Dressing: Maximal assistance;Sit to/from stand   Toilet Transfer: Minimal assistance;Ambulation;RW           Functional mobility during ADLs: Min guard;Rolling walker General ADL Comments: Pt able to recall 2 sternal precautions, but still not generalize. Cues for completing oral care task.      Vision                      Perception     Praxis      Cognition   Behavior During Therapy: [facility] for tasks assessed/performed Overall Cognitive Status: Impaired/Different from baseline Area of Impairment: Memory;Problem solving     Memory: Decreased short-term memory;Decreased recall of precautions        Problem Solving: Difficulty sequencing;Requires verbal cues      Extremity/Trunk Assessment               Exercises     Shoulder Instructions       General Comments      Pertinent Vitals/ Pain       Pain Assessment: Faces Faces Pain Scale: Hurts little more Pain Location: back Pain Descriptors / Indicators: Aching;Grimacing;Guarding Pain Intervention(s): Monitored during session;Repositioned  Home Living                                          Prior Functioning/Environment              Frequency Min 2X/week     Progress Toward Goals  OT Goals(current goals can now be found in the care plan section)  Progress towards OT goals: Progressing toward goals  Acute Rehab OT Goals Patient Stated Goal: Per wife to recover and come home. Time For Goal Achievement: 11/17/15 Potential to Achieve Goals: Good  Plan Discharge plan needs to be updated    Co-evaluation                 End of Session Equipment Utilized During Treatment: Rolling walker   Activity Tolerance Patient tolerated treatment well   Patient Left in chair;with call bell/phone within reach;with family/visitor present   Nurse Communication          Time: [phone] OT Time Calculation (min): 19 min  Charges: OT General Charges $OT Visit: 1 Procedure OT Treatments $Self Care/Home Management : 8-22 mins  Evern Bio 11/13/2015, 10:06 AM  [phone]

## 2015-11-13 NOTE — Progress Notes (Signed)
CARDIAC REHAB PHASE I   Ed completed with pt and wife. Voiced understanding. He did not have IS since 2S therefore got him a new one. He continues to need reminders of not using arms and rocking to stand. [facility] Interested in [facility] and will send referral to [place] [facility] (has done program before). Pt would like rollator once for d/c from SNF. Encouraged tighter DM control. [phone]  Harriet MassonRandi Kristan Dontez Hauss CES, ACSM 11/13/2015 10:33 AM

## 2015-11-13 NOTE — Progress Notes (Addendum)
      [address]       [place] [id number]             [phone]      24 Days Post-Op Procedure(s) (LRB): REDO CORONARY ARTERY BYPASS GRAFTING (CABG), TIMES THREE, USING LEFT GREATER SAPHENOUS VEIN HARVESTED ENDOSCOPICALLY AND CRYO VEIN, WITH CORONARY ENDARTERECTOMY (N/A) TRANSESOPHAGEAL ECHOCARDIOGRAM (TEE) (N/A) ENDARTERECTOMY CAROTID (Right)    Subjective:  [name] states he is doing well.  His trach was removed yesterday and he remains on room air without difficulty.    Objective: Vital signs in last 24 hours: Temp:  [98.2 F (36.8 C)-98.5 F (36.9 C)] 98.5 F (36.9 C) (05/22 0556) Pulse Rate:  [90-96] 96 (05/22 0556) Cardiac Rhythm:  [-] Normal sinus rhythm (05/22 0700) Resp:  [18] 18 (05/22 0556) BP: (106-129)/(57-64) 106/57 mmHg (05/22 0556) SpO2:  [92 %-98 %] 92 % (05/22 0556) Weight:  [280 lb 14.4 oz (127.415 kg)] 280 lb 14.4 oz (127.415 kg) (05/22 0556)  Intake/Output from previous day: 05/21 0701 - 05/22 0700 In: 480 [P.O.:480] Out: 1000 [Urine:1000]  General appearance: alert, cooperative and no distress Heart: regular rate and rhythm Lungs: clear to auscultation bilaterally Abdomen: soft, non-tender; bowel sounds normal; no masses,  no organomegaly Extremities: edema 1+ Wound: clean and dry  Lab Results: No results for input(s): WBC, HGB, HCT, PLT in the last 72 hours. BMET:  Recent Labs  11/11/15 0400 11/12/15 0525  NA 141 138  K 3.9 3.9  CL 100* 101  CO2 30 29  GLUCOSE 114* 126*  BUN 25* 24*  CREATININE 1.38* 1.34*  CALCIUM 9.0 8.8*    PT/INR: No results for input(s): LABPROT, INR in the last 72 hours. ABG    Component Value Date/Time   PHART 7.305* 10/28/2015 0527   HCO3 34.6* 10/28/2015 0527   TCO2 31 11/03/2015 1840   ACIDBASEDEF 1.0 10/20/2015 1824   O2SAT 67.0 10/31/2015 0345   CBG (last 3)   Recent Labs  11/12/15 1633 11/12/15 2116 11/13/15 0604  GLUCAP 114* 129* 92    Assessment/Plan: S/P  Procedure(s) (LRB): REDO CORONARY ARTERY BYPASS GRAFTING (CABG), TIMES THREE, USING LEFT GREATER SAPHENOUS VEIN HARVESTED ENDOSCOPICALLY AND CRYO VEIN, WITH CORONARY ENDARTERECTOMY (N/A) TRANSESOPHAGEAL ECHOCARDIOGRAM (TEE) (N/A) ENDARTERECTOMY CAROTID (Right)  1. CV- remains hemodynamically stable, ACE d/c'd over the weekend for elevated creatinine, tolerating Norvasc 2. Pulm- no acute issues, trach removed yesterday, continue IS 3. Renal- creatinine has been stable around 1.30, worsening LE edema, on Lasix will give 1 dose of Zaroxolyn today, apply TED hose 4. Deconditioning- SNF when bed available, however wife waited in room Friday and no one came to speak with her 5. Dispo- patient stable, off trach, add TED, for SNF once bed available   LOS: 28 days    Tyler Le, Tyler Le 11/13/2015 Recheck Bmet in am patient examined and medical record reviewed,agree with above note. Tyler Nationseter Tyler Le 11/13/2015

## 2015-11-14 LAB — BASIC METABOLIC PANEL
ANION GAP: 7 (ref 5–15)
BUN: 19 mg/dL (ref 6–20)
CO2: 32 mmol/L (ref 22–32)
Calcium: 8.8 mg/dL — ABNORMAL LOW (ref 8.9–10.3)
Chloride: 97 mmol/L — ABNORMAL LOW (ref 101–111)
Creatinine, Ser: 1.4 mg/dL — ABNORMAL HIGH (ref 0.61–1.24)
GFR, EST AFRICAN AMERICAN: 56 mL/min — AB (ref >60)
GFR, EST NON AFRICAN AMERICAN: 49 mL/min — AB (ref >60)
Glucose, Bld: 134 mg/dL — ABNORMAL HIGH (ref 65–99)
Potassium: 3.4 mmol/L — ABNORMAL LOW (ref 3.5–5.1)
Sodium: 136 mmol/L (ref 135–145)

## 2015-11-14 LAB — GLUCOSE, CAPILLARY
Glucose-Capillary: 133 mg/dL — ABNORMAL HIGH (ref 65–99)
Glucose-Capillary: 194 mg/dL — ABNORMAL HIGH (ref 65–99)

## 2015-11-14 MED ORDER — POTASSIUM CHLORIDE CRYS ER 20 MEQ PO TBCR: 20.0000 meq | EXTENDED_RELEASE_TABLET | Freq: Every day | ORAL | Status: DC

## 2015-11-14 MED ORDER — PANTOPRAZOLE SODIUM 40 MG PO TBEC: 40.0000 mg | DELAYED_RELEASE_TABLET | Freq: Every day | ORAL | Status: AC

## 2015-11-14 MED ORDER — DOCUSATE SODIUM 100 MG PO CAPS: 200.0000 mg | ORAL_CAPSULE | Freq: Two times a day (BID) | ORAL | Status: DC

## 2015-11-14 NOTE — Progress Notes (Signed)
Order received to DC chest tube sutures.  Sutures removed and intact.  Pt tol well.  Sites covered with steristrips.

## 2015-11-14 NOTE — Clinical Social Work Placement (Signed)
CLINICAL SOCIAL WORK PLACEMENT  NOTE  Date:  11/14/2015  Patient Details  Name: Tyler Le MRN: [id number] Date of Birth: 24-Jun-1946  Clinical Social Work is seeking post-discharge placement for this patient at the Skilled  Nursing Facility level of care (*CSW will initial, date and re-position this form in  chart as items are completed):  Yes   Patient/family provided with [place] Clinical Social Work Department's list of facilities offering this level of care within the geographic area requested by the patient (or if unable, by the patient's family).  Yes   Patient/family informed of their freedom to choose among providers that offer the needed level of care, that participate in Medicare, Medicaid or managed care program needed by the patient, have an available bed and are willing to accept the patient.  Yes   Patient/family informed of [facility]'s ownership interest in [facility] and [facility], as well as of the fact that they are under no obligation to receive care at these facilities.  PASRR submitted to EDS on 11/09/15     PASRR number received on 11/14/15     Existing PASRR number confirmed on       FL2 transmitted to all facilities in geographic area requested by pt/family on 11/14/15     FL2 transmitted to all facilities within larger geographic area on       Patient informed that his/her managed care company has contracts with or will negotiate with certain facilities, including the following:        Yes   Patient/family informed of bed offers received.  Patient chooses bed at [facility] [place]     Physician recommends and patient chooses bed at      Patient to be transferred to [facility] [place] on 11/14/15.  Patient to be transferred to facility by Ambulance     Patient family notified on 11/14/15 of transfer.  Name of family member notified:  [name] (patient's wife)     PHYSICIAN Please prepare priority discharge summary,  including medications, Please sign FL2, Please prepare prescriptions     Additional Comment:  Per MD patient is ready to discharge to [facility] [place].  RN, patient, patient's family, and facility notified of discharge. RN given phone number for report and transport packet is on patient's chart. Ambulance transport requested. CSW signing off.   _______________________________________________ Reggy Eye, LCSW 11/14/2015, 12:22 PM

## 2015-11-14 NOTE — Progress Notes (Addendum)
      [address]       [place] [id number]             [phone]      25 Days Post-Op Procedure(s) (LRB): REDO CORONARY ARTERY BYPASS GRAFTING (CABG), TIMES THREE, USING LEFT GREATER SAPHENOUS VEIN HARVESTED ENDOSCOPICALLY AND CRYO VEIN, WITH CORONARY ENDARTERECTOMY (N/A) TRANSESOPHAGEAL ECHOCARDIOGRAM (TEE) (N/A) ENDARTERECTOMY CAROTID (Right)   Subjective:  [name] has no new complaints.  He is anxious to be discharged to SNF.  Objective: Vital signs in last 24 hours: Temp:  [98.2 F (36.8 C)-98.6 F (37 C)] 98.6 F (37 C) (05/23 0414) Pulse Rate:  [95-102] 102 (05/23 0414) Cardiac Rhythm:  [-] Sinus tachycardia;Bundle branch block (05/22 1940) Resp:  [18] 18 (05/23 0414) BP: (127-139)/(63-70) 134/64 mmHg (05/23 0414) SpO2:  [89 %-98 %] 89 % (05/23 0414) Weight:  [280 lb 3.3 oz (127.1 kg)] 280 lb 3.3 oz (127.1 kg) (05/23 0412)  Intake/Output from previous day: 05/22 0701 - 05/23 0700 In: 240 [P.O.:240] Out: 1425 [Urine:1425]  General appearance: alert, cooperative and no distress Heart: regular rate and rhythm Lungs: clear to auscultation bilaterally Abdomen: soft, non-tender; bowel sounds normal; no masses,  no organomegaly Extremities: edema trace Wound: clean and dry  Lab Results: No results for input(s): WBC, HGB, HCT, PLT in the last 72 hours. BMET:  Recent Labs  11/12/15 0525 11/14/15 0535  NA 138 136  K 3.9 3.4*  CL 101 97*  CO2 29 32  GLUCOSE 126* 134*  BUN 24* 19  CREATININE 1.34* 1.40*  CALCIUM 8.8* 8.8*    PT/INR: No results for input(s): LABPROT, INR in the last 72 hours. ABG    Component Value Date/Time   PHART 7.305* 10/28/2015 0527   HCO3 34.6* 10/28/2015 0527   TCO2 31 11/03/2015 1840   ACIDBASEDEF 1.0 10/20/2015 1824   O2SAT 67.0 10/31/2015 0345   CBG (last 3)   Recent Labs  11/13/15 1615 11/13/15 2124 11/14/15 0554  GLUCAP 125* 177* 133*    Assessment/Plan: S/P Procedure(s) (LRB): REDO CORONARY  ARTERY BYPASS GRAFTING (CABG), TIMES THREE, USING LEFT GREATER SAPHENOUS VEIN HARVESTED ENDOSCOPICALLY AND CRYO VEIN, WITH CORONARY ENDARTERECTOMY (N/A) TRANSESOPHAGEAL ECHOCARDIOGRAM (TEE) (N/A) ENDARTERECTOMY CAROTID (Right)  1. CV- remains hemodynamically stable 2. Pulm- no acute issues, continue IS 3. Renal- creatinine remains relatively stable, remains hypervolemic, on lasix 4. Dysphagia- resolved, pass SLP evaluation on heart healthy/diabetic diet 5. Deconditioning- SNF when bed available 6. DM- sugars controlled, continue current regimen 7. Dispo- patient stable, ready for discharge one bed becomes available   LOS: 29 days    BARRETT, ERIN 11/14/2015  patient examined and medical record reviewed,agree with above note. Kathlee Nationseter Van Trigt III 11/14/2015

## 2015-11-14 NOTE — NC FL2 (Signed)
[place] MEDICAID FL2 LEVEL OF CARE SCREENING TOOL     IDENTIFICATION  Patient Name: Tyler Le Birthdate: 07/01/1945 Sex: male Admission Date (Current Location): 10/16/2015  [facility] and [place] Number:  Herbalist and Address:  [facility] [place] [place] [address] [place], [place] [address]      Provider Number: [id number]  Attending Physician Name and Address:  Ivin Poot, MD  Relative Name and Phone Number:       Current Level of Care: Hospital Recommended Level of Care: [place] Prior Approval Number:    Date Approved/Denied:   PASRR Number: [id number]  Discharge Plan: SNF    Current Diagnoses: Patient Active Problem List   Diagnosis Date Noted  . Ileus ([place])   . S/P CABG x 4   . Surgery, elective   . Tracheostomy in place [facility])   . Debility   . Benign essential HTN   . Adjustment disorder with mixed anxiety and depressed mood   . Obesity   . Chronic low back pain   . OSA (obstructive sleep apnea)   . Dysphagia   . Tachycardia   . Tachypnea   . Acute blood loss anemia   . Left lower lobe pneumonia   . Tracheostomy care ([place])   . Atelectasis   . Feeding tube dysfunction   . Acute respiratory failure with hypoxemia ([place])   . S/P CABG x 3 10/20/2015  . Altered mental status   . Coronary artery disease involving coronary bypass graft of native heart with unspecified angina pectoris   . Coronary artery disease involving native coronary artery of native heart without angina pectoris   . CKD (chronic kidney disease), stage III 10/17/2015  . Unstable angina ([place]) 10/16/2015  . Accelerating angina ([place])   . Acute kidney injury ([place]) 08/02/2014  . AKI (acute kidney injury) ([place]) 08/01/2014  . Renal failure 08/01/2014  . Gastroenteritis, acute 08/01/2014  . Diabetes mellitus type 2 in obese ([place]) 08/01/2014  . Abnormal nuclear stress test 07/11/2014  . Hypertension   . Essential hypertension   .  Depression 05/08/2011  . CAD (coronary artery disease) 01/15/2011  . S/P CABG (coronary artery bypass graft) 10/03/2010  . Angina pectoris 10/03/2010  . Type II diabetes mellitus ([place]) 10/03/2010  . Dyslipidemia 10/03/2010  . Low back pain 10/03/2010  . Anxiety 10/03/2010  . Benign hypertensive heart disease without heart failure 10/03/2010    [place] RESPIRATION BLADDER Height & Weight     Self, Time, Situation, Place   Continent Weight: 280 lb 3.3 oz (127.1 kg) Height:  '6\' 4"'  (193 cm)  BEHAVIORAL SYMPTOMS/MOOD NEUROLOGICAL BOWEL NUTRITION STATUS   (None)  (None) Continent Diet (Heart)  AMBULATORY STATUS COMMUNICATION OF NEEDS Skin   Extensive Assist Verbally Surgical wounds (Incision Closed: LT leg lateral mid, Sternum anterio mid, RT neck )                       Personal Care Assistance Level of Assistance  Bathing, Feeding, Dressing Bathing Assistance: Limited assistance Feeding assistance: Independent Dressing Assistance: Limited assistance     Functional Limitations Info  Sight, Hearing, Speech Sight Info: Impaired Hearing Info: Adequate Speech Info: Adequate    SPECIAL CARE FACTORS FREQUENCY  PT (By licensed PT), OT (By licensed OT)     PT Frequency: 5/ week OT Frequency: 5/ week            Contractures Contractures Info: Not present  Additional Factors Info  Code Status, Allergies, Psychotropic, Insulin Sliding Scale Code Status Info: Full Allergies Info: Metformin And Related, Pravachol, Ramipril, Wellbutrin Bupropion Hcl Psychotropic Info: Buspar 15 mg daily Insulin Sliding Scale Info:         Current Medications (11/14/2015):  This is the current hospital active medication list Current Facility-Administered Medications  Medication Dose Route Frequency Provider Last Rate Last Dose  . 0.45 % sodium chloride infusion   Intravenous Continuous Raylene Miyamoto, MD 10 mL/hr at 10/24/15 1900    . 0.9 %  sodium chloride infusion  250 mL  Intravenous Continuous Erin R Barrett, PA-C 1 mL/hr at 11/06/15 1900 250 mL at 11/06/15 1900  . 0.9 %  sodium chloride infusion   Intravenous Continuous Erin R Barrett, PA-C 10 mL/hr at 11/04/15 0513    . amLODipine (NORVASC) tablet 5 mg  5 mg Oral Daily Wayne E Gold, PA-C   5 mg at 11/14/15 1004  . antiseptic oral rinse solution (CORINZ)  7 mL Mouth Rinse QID Ivin Poot, MD   7 mL at 11/13/15 1200  . aspirin EC tablet 325 mg  325 mg Oral Daily Erin R Barrett, PA-C   325 mg at 11/14/15 1003   Or  . aspirin chewable tablet 324 mg  324 mg Per Tube Daily Erin R Barrett, PA-C   324 mg at 11/11/15 0925  . bisacodyl (DULCOLAX) EC tablet 10 mg  10 mg Oral Daily Erin R Barrett, PA-C   10 mg at 11/14/15 1002   Or  . bisacodyl (DULCOLAX) suppository 10 mg  10 mg Rectal Daily Erin R Barrett, PA-C   10 mg at 10/25/15 1024  . busPIRone (BUSPAR) tablet 15 mg  15 mg Oral Daily Jettie Booze, MD   15 mg at 11/14/15 1003  . carvedilol (COREG) tablet 12.5 mg  12.5 mg Oral BID WC Ivin Poot, MD   12.5 mg at 11/14/15 0830  . chlorhexidine gluconate (SAGE KIT) (PERIDEX) 0.12 % solution 15 mL  15 mL Mouth Rinse BID Ivin Poot, MD   15 mL at 11/14/15 0800  . clopidogrel (PLAVIX) tablet 75 mg  75 mg Oral Daily Ivin Poot, MD   75 mg at 11/14/15 1002  . docusate sodium (COLACE) capsule 200 mg  200 mg Oral BID Jake Church Masters, RPH   200 mg at 11/14/15 1000  . DULoxetine (CYMBALTA) DR capsule 60 mg  60 mg Oral Daily Jettie Booze, MD   60 mg at 11/14/15 1004  . enoxaparin (LOVENOX) injection 40 mg  40 mg Subcutaneous Q24H Ivin Poot, MD   40 mg at 11/14/15 0959  . ezetimibe (ZETIA) tablet 10 mg  10 mg Oral Daily Rogelia Mire, NP   10 mg at 11/14/15 1002  . feeding supplement (VITAL HIGH PROTEIN) liquid 1,000 mL  1,000 mL Per Tube Continuous Ivin Poot, MD   Stopped at 11/07/15 0900  . fentaNYL (SUBLIMAZE) injection 25 mcg  25 mcg Intravenous Q2H PRN Marijean Heath, NP    25 mcg at 11/07/15 7209  . furosemide (LASIX) tablet 40 mg  40 mg Oral Daily Ivin Poot, MD   40 mg at 11/14/15 1004  . Gerhardt's butt cream   Topical PRN Ivin Poot, MD      . hydrALAZINE (APRESOLINE) injection 10 mg  10 mg Intravenous Q4H PRN Gaye Pollack, MD   10 mg at 11/03/15 0325  . HYDROcodone-acetaminophen ([place])  10-325 MG per tablet 1 tablet  1 tablet Oral Q6H PRN Marijean Heath, NP   1 tablet at 11/13/15 2240  . insulin aspart (novoLOG) injection 0-20 Units  0-20 Units Subcutaneous TID WC Ivin Poot, MD   3 Units at 11/14/15 [phone]  . insulin aspart (novoLOG) injection 0-5 Units  0-5 Units Subcutaneous QHS Ivin Poot, MD   0 Units at 11/12/15 2126  . insulin detemir (LEVEMIR) injection 35 Units  35 Units Subcutaneous BID Ivin Poot, MD   35 Units at 11/14/15 [phone]  . lactated ringers infusion   Intravenous Continuous Erin R Barrett, PA-C      . lactated ringers infusion   Intravenous Continuous Erin R Barrett, PA-C 10 mL/hr at 10/25/15 0700    . levalbuterol (XOPENEX) nebulizer solution 0.63 mg  0.63 mg Nebulization Q6H PRN Ivin Poot, MD      . linaclotide [facility]) capsule 145 mcg  145 mcg Oral Daily Jettie Booze, MD   145 mcg at 11/12/15 1004  . Liraglutide SOPN 1.2 mg  1.2 mg Subcutaneous Daily Jettie Booze, MD   1.2 mg at 11/14/15 0959  . mirtazapine (REMERON) tablet 15 mg  15 mg Oral QHS Ivin Poot, MD   15 mg at 11/13/15 2232  . ondansetron (ZOFRAN) injection 4 mg  4 mg Intravenous Q6H PRN Jettie Booze, MD      . ondansetron [facility]) injection 4 mg  4 mg Intravenous Q6H PRN Erin R Barrett, PA-C   4 mg at 10/31/15 0859  . oxymetazoline (AFRIN) 0.05 % nasal spray 2 spray  2 spray Each Nare BID Ivin Poot, MD   2 spray at 11/14/15 1000  . pantoprazole (PROTONIX) EC tablet 40 mg  40 mg Oral Daily Jake Church Masters, RPH   40 mg at 11/14/15 1003  . potassium chloride 10 mEq in 50 mL *CENTRAL LINE* IVPB  10 mEq  Intravenous Q1H PRN Ivin Poot, MD      . potassium chloride SA (K-DUR,KLOR-CON) CR tablet 20 mEq  20 mEq Oral Daily Jake Church Masters, RPH   20 mEq at 11/14/15 1004  . QUEtiapine (SEROQUEL) tablet 150 mg  150 mg Oral QHS Ivin Poot, MD   150 mg at 11/13/15 2228  . RESOURCE THICKENUP CLEAR   Oral PRN Ivin Poot, MD      . sodium chloride flush (NS) 0.9 % injection 10-40 mL  10-40 mL Intracatheter PRN Jettie Booze, MD   30 mL at 11/13/15 0549  . sodium chloride flush (NS) 0.9 % injection 10-40 mL  10-40 mL Intracatheter Q12H Mal Misty, MD   30 mL at 11/10/15 1000  . sodium chloride flush (NS) 0.9 % injection 3 mL  3 mL Intravenous Q12H Erin R Barrett, PA-C   3 mL at 11/08/15 2106  . traMADol (ULTRAM) tablet 50-100 mg  50-100 mg Oral Q4H PRN Erin R Barrett, PA-C   100 mg at 11/13/15 1946  . witch hazel-glycerin (TUCKS) pad   Topical PRN Ivin Poot, MD         Discharge Medications: Please see discharge summary for a list of discharge medications.  Relevant Imaging Results:  Relevant Lab Results:   Additional Information [id number]  Samule Dry, LCSW

## 2015-11-14 NOTE — Care Management Note (Signed)
Case Management Note Previous CM note initiated by Elenor Quinones RN, CM  Patient Details  Name: Tyler Le MRN: [id number] Date of Birth: May 06, 1946  Subjective/Objective:  Pt admitted for Unstable Angina. Plan for CABG on 10-20-15. Pt is from home with wife. Pt has no DME needs at this time.                 Action/Plan: 11/14/2015 Pt is from home with wife.  Pt remains ventilated on sedation and pressors.  CM will continue to monitor for disposition needs  CM will continue to monitor for disposition needs.   Expected Discharge Date:    11/14/15              Expected Discharge Plan:  Long Term Acute Care (LTAC)  In-House Referral:  NA, Clinical Social Work  Discharge planning Services  CM Consult  Post Acute Care Choice:    Choice offered to:     DME Arranged:    DME Agency:     HH Arranged:    [place] Agency:     Status of Service:  Completed, signed off  Medicare Important Message Given:  Yes Date Medicare IM Given:    Medicare IM give by:    Date Additional Medicare IM Given:    Additional Medicare Important Message give by:     If discussed at [place] of Stay Meetings, dates discussed:  11/13/15  Additional Comments:   11/14/2015 -Marvetta Gibbons RN, BSN  -- pt for d/c today- CSW following for passar- and SNF bed placement - pt and wife wanting [facility]-  CSW to follow for d/c needs   11/10/15- Marvetta Gibbons RN, BSN- per Genie with CIR- insurance has denied CIR for rehab- pt will need STSNF for discharge- CSW aware and working on placement- pt will need passar prior to discharge.   [employer] denied peer to peer.  CIR recommended - CSW consulted as back up plan  11/03/15 [employer] denied LTACH coverage.  Attending agreed to perform peer to peer with [facility].  Pt is on trach collar 60-80%.  TPN, IV antibiotics.  CM will continue to follow for disposition needs   10/31/15 CM met with wife and son and explained the recommended discharge plan of LTACH.  Both wife and son are in  agreement with [facility] as discharge disposition.  [facility] liaison met with family and [facility] has been chosen- insurance auth is pending  10/30/15 CM received LTACH referral.  CM gave referral to both [facility] and [facility].  CM contact pts wife [name] via phone to inform of discharge recommendation and provided geographical information regarding the two options in the local area.  Pts wife will contact son and follow back up with CM.   10/27/15 Pt trached today 10/27/15.  CM had very detailed discussion with wife regarding discharge options should pt not be able to wean off vent/trach prior to discharge including;  Home with Home Health (requirements explained in detail), LTAC, and SNF.  CM explained that discharge planning begins early so that barriers can be eliminated prior to possible delays in progression of care.  CM will continue to follow pt and family and assist with discharge needs.  CSW consulted on tentative consult.     Dahlia Client [place], RN 11/14/2015, 12:34 PM [phone]

## 2015-11-14 NOTE — Care Management Important Message (Signed)
Important Message  Patient Details  Name: Tyler Le MRN: [id number] Date of Birth: 1946-06-17   Medicare Important Message Given:  Yes    [name], Kullen Tomasetti [name] 11/14/2015, 10:26 AM

## 2015-11-14 NOTE — Progress Notes (Signed)
Report called to [facility].  Rechecked Pt 02, Pt satting 96% on RA.  Pt denies chest pain or sob.  Pt stable at discharge.  [facility]

## 2015-11-14 NOTE — Progress Notes (Signed)
Order received to discharge.  Call placed to PA and verbal order received to DC PICC line d/t no therapy needed.  Telemetry removed and CCMD notified.  Discharge education given to Pt.  All questions answered.  Will alert SW that Pt ready to discharge.  Will call report to facility.  Cont to monitor.

## 2015-11-14 NOTE — Progress Notes (Signed)
Physical Therapy Treatment Patient Details Name: Tyler Le MRN: [id number] DOB: Feb 07, 1946 Today's Date: 11/14/2015    History of Present Illness pt presents with Cardiac Arrest and Redo of CABG x3.  pt intubated 4/24 and with Trach on 5/5.  pt with hx of CABG, Anxiety, HTN, DM, CAD, Renal Insufficiency, and Back Surgeries.      PT Comments    Pt continues to make good progress with mobility. [facility].  Follow Up Recommendations  SNF     Equipment Recommendations  Other (comment) (rollator)    Recommendations for Other Services       Precautions / Restrictions Precautions Precautions: Fall;Sternal Restrictions Weight Bearing Restrictions: Yes Other Position/Activity Restrictions: sternal precautions    Mobility  Bed Mobility               General bed mobility comments: pt in chair  Transfers Overall transfer level: Needs assistance Equipment used: 4-wheeled walker Transfers: Sit to/from Stand Sit to Stand: Min guard         General transfer comment: Verbal cues to place hands on knees but no physical assist required.  Ambulation/Gait Ambulation/Gait assistance: Min guard Ambulation Distance (Feet): 170 Feet Assistive device: 4-wheeled walker Gait Pattern/deviations: Step-through pattern;Decreased stride length;Trunk flexed     General Gait Details: Assist for balance and safety. SpO2 92% on RA after amb. No rest breaks required.   Stairs            Wheelchair Mobility    Modified Rankin (Stroke Patients Only)       Balance Overall balance assessment: Needs assistance Sitting-balance support: No upper extremity supported Sitting balance-[name] Scale: Good     Standing balance support: No upper extremity supported Standing balance-[name] Scale: Fair                      Cognition Arousal/Alertness: Awake/alert Behavior During Therapy: WFL for tasks assessed/performed Overall Cognitive Status:  Impaired/Different from baseline Area of Impairment: Memory;Problem solving     Memory: Decreased short-term memory;Decreased recall of precautions       Problem Solving: Difficulty sequencing;Requires verbal cues      Exercises      General Comments        Pertinent Vitals/Pain Pain Assessment: Faces Faces Pain Scale: Hurts little more Pain Location: back Pain Descriptors / Indicators: Aching Pain Intervention(s): Limited activity within patient's tolerance;Monitored during session;Repositioned    Home Living                      Prior Function            PT Goals (current goals can now be found in the care plan section) Progress towards PT goals: Progressing toward goals    Frequency  Min 2X/week    PT Plan Current plan remains appropriate;Frequency needs to be updated    Co-evaluation             End of Session   Activity Tolerance: Patient tolerated treatment well Patient left: in chair;with call bell/phone within reach     Time: [id number] PT Time Calculation (min) (ACUTE ONLY): 12 min  Charges:  $Gait Training: 8-22 mins                    G Codes:      Faven Watterson 11/14/2015, 9:55 AM [facility] [phone]

## 2015-11-15 ENCOUNTER — Encounter: Payer: Self-pay | Admitting: Vascular Surgery

## 2015-11-21 ENCOUNTER — Ambulatory Visit: Payer: Medicare Other | Admitting: Vascular Surgery

## 2015-11-29 ENCOUNTER — Telehealth: Payer: Self-pay | Admitting: Cardiovascular Disease

## 2015-11-29 NOTE — Telephone Encounter (Signed)
New message   Enrique SackKendra, Need provider to sign off for pt to receive home health care  [facility]

## 2015-11-29 NOTE — Telephone Encounter (Signed)
Spoke with Enrique SackKendra and notified her that Dr. Elease HashimotoNahser was not involved in patient's recent hospitalization and did not order home health.  She thanked me for the call.

## 2015-11-30 ENCOUNTER — Encounter: Payer: Self-pay | Admitting: Vascular Surgery

## 2015-12-02 ENCOUNTER — Encounter (HOSPITAL_COMMUNITY): Payer: Self-pay | Admitting: Emergency Medicine

## 2015-12-02 ENCOUNTER — Emergency Department (HOSPITAL_COMMUNITY): Payer: Medicare Other

## 2015-12-02 ENCOUNTER — Inpatient Hospital Stay (HOSPITAL_COMMUNITY)
Admission: EM | Admit: 2015-12-02 | Discharge: 2015-12-06 | DRG: 175 | Disposition: A | Discharge status: Home / Self Care | Payer: Medicare Other | Attending: Internal Medicine | Admitting: Internal Medicine

## 2015-12-02 DIAGNOSIS — E669 Obesity, unspecified: Secondary | ICD-10-CM

## 2015-12-02 DIAGNOSIS — Z955 Presence of coronary angioplasty implant and graft: Secondary | ICD-10-CM

## 2015-12-02 DIAGNOSIS — R0682 Tachypnea, not elsewhere classified: Secondary | ICD-10-CM

## 2015-12-02 DIAGNOSIS — Z87891 Personal history of nicotine dependence: Secondary | ICD-10-CM | POA: Diagnosis not present

## 2015-12-02 DIAGNOSIS — J189 Pneumonia, unspecified organism: Secondary | ICD-10-CM

## 2015-12-02 DIAGNOSIS — I739 Peripheral vascular disease, unspecified: Secondary | ICD-10-CM | POA: Diagnosis not present

## 2015-12-02 DIAGNOSIS — I10 Essential (primary) hypertension: Secondary | ICD-10-CM

## 2015-12-02 DIAGNOSIS — I25709 Atherosclerosis of coronary artery bypass graft(s), unspecified, with unspecified angina pectoris: Secondary | ICD-10-CM

## 2015-12-02 DIAGNOSIS — I82411 Acute embolism and thrombosis of right femoral vein: Secondary | ICD-10-CM | POA: Diagnosis present

## 2015-12-02 DIAGNOSIS — Z6835 Body mass index (BMI) 35.0-35.9, adult: Secondary | ICD-10-CM | POA: Diagnosis not present

## 2015-12-02 DIAGNOSIS — E1122 Type 2 diabetes mellitus with diabetic chronic kidney disease: Secondary | ICD-10-CM | POA: Diagnosis present

## 2015-12-02 DIAGNOSIS — I2699 Other pulmonary embolism without acute cor pulmonale: Secondary | ICD-10-CM | POA: Diagnosis not present

## 2015-12-02 DIAGNOSIS — I130 Hypertensive heart and chronic kidney disease with heart failure and stage 1 through stage 4 chronic kidney disease, or unspecified chronic kidney disease: Secondary | ICD-10-CM | POA: Diagnosis present

## 2015-12-02 DIAGNOSIS — R7989 Other specified abnormal findings of blood chemistry: Secondary | ICD-10-CM

## 2015-12-02 DIAGNOSIS — Z7902 Long term (current) use of antithrombotics/antiplatelets: Secondary | ICD-10-CM

## 2015-12-02 DIAGNOSIS — K529 Noninfective gastroenteritis and colitis, unspecified: Secondary | ICD-10-CM

## 2015-12-02 DIAGNOSIS — N179 Acute kidney failure, unspecified: Secondary | ICD-10-CM

## 2015-12-02 DIAGNOSIS — I272 Other secondary pulmonary hypertension: Secondary | ICD-10-CM | POA: Diagnosis present

## 2015-12-02 DIAGNOSIS — N183 Chronic kidney disease, stage 3 unspecified: Secondary | ICD-10-CM

## 2015-12-02 DIAGNOSIS — E1151 Type 2 diabetes mellitus with diabetic peripheral angiopathy without gangrene: Secondary | ICD-10-CM | POA: Diagnosis present

## 2015-12-02 DIAGNOSIS — Z7982 Long term (current) use of aspirin: Secondary | ICD-10-CM

## 2015-12-02 DIAGNOSIS — J9811 Atelectasis: Secondary | ICD-10-CM

## 2015-12-02 DIAGNOSIS — F4323 Adjustment disorder with mixed anxiety and depressed mood: Secondary | ICD-10-CM

## 2015-12-02 DIAGNOSIS — Z79899 Other long term (current) drug therapy: Secondary | ICD-10-CM | POA: Diagnosis not present

## 2015-12-02 DIAGNOSIS — I2692 Saddle embolus of pulmonary artery without acute cor pulmonale: Secondary | ICD-10-CM | POA: Diagnosis present

## 2015-12-02 DIAGNOSIS — I4510 Unspecified right bundle-branch block: Secondary | ICD-10-CM | POA: Diagnosis present

## 2015-12-02 DIAGNOSIS — I5032 Chronic diastolic (congestive) heart failure: Secondary | ICD-10-CM | POA: Diagnosis present

## 2015-12-02 DIAGNOSIS — G8929 Other chronic pain: Secondary | ICD-10-CM

## 2015-12-02 DIAGNOSIS — G4733 Obstructive sleep apnea (adult) (pediatric): Secondary | ICD-10-CM

## 2015-12-02 DIAGNOSIS — D62 Acute posthemorrhagic anemia: Secondary | ICD-10-CM

## 2015-12-02 DIAGNOSIS — Z9049 Acquired absence of other specified parts of digestive tract: Secondary | ICD-10-CM

## 2015-12-02 DIAGNOSIS — F418 Other specified anxiety disorders: Secondary | ICD-10-CM | POA: Diagnosis present

## 2015-12-02 DIAGNOSIS — K567 Ileus, unspecified: Secondary | ICD-10-CM

## 2015-12-02 DIAGNOSIS — I2510 Atherosclerotic heart disease of native coronary artery without angina pectoris: Secondary | ICD-10-CM

## 2015-12-02 DIAGNOSIS — J9601 Acute respiratory failure with hypoxia: Secondary | ICD-10-CM

## 2015-12-02 DIAGNOSIS — N19 Unspecified kidney failure: Secondary | ICD-10-CM

## 2015-12-02 DIAGNOSIS — F329 Major depressive disorder, single episode, unspecified: Secondary | ICD-10-CM

## 2015-12-02 DIAGNOSIS — Z930 Tracheostomy status: Secondary | ICD-10-CM

## 2015-12-02 DIAGNOSIS — K219 Gastro-esophageal reflux disease without esophagitis: Secondary | ICD-10-CM | POA: Diagnosis present

## 2015-12-02 DIAGNOSIS — E1129 Type 2 diabetes mellitus with other diabetic kidney complication: Secondary | ICD-10-CM | POA: Diagnosis not present

## 2015-12-02 DIAGNOSIS — Z419 Encounter for procedure for purposes other than remedying health state, unspecified: Secondary | ICD-10-CM

## 2015-12-02 DIAGNOSIS — J181 Lobar pneumonia, unspecified organism: Secondary | ICD-10-CM

## 2015-12-02 DIAGNOSIS — E119 Type 2 diabetes mellitus without complications: Secondary | ICD-10-CM

## 2015-12-02 DIAGNOSIS — Z951 Presence of aortocoronary bypass graft: Secondary | ICD-10-CM

## 2015-12-02 DIAGNOSIS — E1169 Type 2 diabetes mellitus with other specified complication: Secondary | ICD-10-CM

## 2015-12-02 DIAGNOSIS — R778 Other specified abnormalities of plasma proteins: Secondary | ICD-10-CM

## 2015-12-02 DIAGNOSIS — Z794 Long term (current) use of insulin: Secondary | ICD-10-CM | POA: Diagnosis not present

## 2015-12-02 DIAGNOSIS — R0902 Hypoxemia: Secondary | ICD-10-CM | POA: Diagnosis not present

## 2015-12-02 DIAGNOSIS — F419 Anxiety disorder, unspecified: Secondary | ICD-10-CM

## 2015-12-02 DIAGNOSIS — E785 Hyperlipidemia, unspecified: Secondary | ICD-10-CM

## 2015-12-02 DIAGNOSIS — R400 Somnolence: Secondary | ICD-10-CM

## 2015-12-02 DIAGNOSIS — M545 Low back pain: Secondary | ICD-10-CM

## 2015-12-02 DIAGNOSIS — J9600 Acute respiratory failure, unspecified whether with hypoxia or hypercapnia: Secondary | ICD-10-CM | POA: Diagnosis not present

## 2015-12-02 DIAGNOSIS — R5381 Other malaise: Secondary | ICD-10-CM

## 2015-12-02 DIAGNOSIS — F32A Depression, unspecified: Secondary | ICD-10-CM

## 2015-12-02 DIAGNOSIS — I313 Pericardial effusion (noninflammatory): Secondary | ICD-10-CM | POA: Diagnosis not present

## 2015-12-02 DIAGNOSIS — I25118 Atherosclerotic heart disease of native coronary artery with other forms of angina pectoris: Secondary | ICD-10-CM

## 2015-12-02 DIAGNOSIS — R9439 Abnormal result of other cardiovascular function study: Secondary | ICD-10-CM

## 2015-12-02 DIAGNOSIS — R0602 Shortness of breath: Secondary | ICD-10-CM | POA: Diagnosis present

## 2015-12-02 DIAGNOSIS — Z430 Encounter for attention to tracheostomy: Secondary | ICD-10-CM

## 2015-12-02 DIAGNOSIS — I82403 Acute embolism and thrombosis of unspecified deep veins of lower extremity, bilateral: Secondary | ICD-10-CM | POA: Diagnosis not present

## 2015-12-02 DIAGNOSIS — I200 Unstable angina: Secondary | ICD-10-CM

## 2015-12-02 DIAGNOSIS — R000 Tachycardia, unspecified: Secondary | ICD-10-CM

## 2015-12-02 DIAGNOSIS — R1310 Dysphagia, unspecified: Secondary | ICD-10-CM

## 2015-12-02 DIAGNOSIS — I119 Hypertensive heart disease without heart failure: Secondary | ICD-10-CM

## 2015-12-02 DIAGNOSIS — E118 Type 2 diabetes mellitus with unspecified complications: Secondary | ICD-10-CM

## 2015-12-02 LAB — TROPONIN I: Troponin I: 0.04 ng/mL — ABNORMAL HIGH (ref <0.031)

## 2015-12-02 LAB — BASIC METABOLIC PANEL
ANION GAP: 7 (ref 5–15)
BUN: 9 mg/dL (ref 6–20)
CHLORIDE: 105 mmol/L (ref 101–111)
CO2: 26 mmol/L (ref 22–32)
Calcium: 9 mg/dL (ref 8.9–10.3)
Creatinine, Ser: 1.21 mg/dL (ref 0.61–1.24)
GFR, EST NON AFRICAN AMERICAN: 58 mL/min — AB (ref >60)
Glucose, Bld: 178 mg/dL — ABNORMAL HIGH (ref 65–99)
POTASSIUM: 4.5 mmol/L (ref 3.5–5.1)
SODIUM: 138 mmol/L (ref 135–145)

## 2015-12-02 LAB — GLUCOSE, CAPILLARY: Glucose-Capillary: 172 mg/dL — ABNORMAL HIGH (ref 65–99)

## 2015-12-02 LAB — I-STAT ARTERIAL BLOOD GAS, ED
BICARBONATE: 25.3 meq/L — AB (ref 20.0–24.0)
O2 SAT: 100 %
PH ART: 7.363 (ref 7.350–7.450)
TCO2: 27 mmol/L (ref 0–100)
pCO2 arterial: 44.5 mmHg (ref 35.0–45.0)
pO2, Arterial: 174 mmHg — ABNORMAL HIGH (ref 80.0–100.0)

## 2015-12-02 LAB — CBC
HCT: 37 % — ABNORMAL LOW (ref 39.0–52.0)
HEMOGLOBIN: 11.1 g/dL — AB (ref 13.0–17.0)
MCH: 27.7 pg (ref 26.0–34.0)
MCHC: 30 g/dL (ref 30.0–36.0)
MCV: 92.3 fL (ref 78.0–100.0)
PLATELETS: 162 10*3/uL (ref 150–400)
RBC: 4.01 MIL/uL — AB (ref 4.22–5.81)
RDW: 15.8 % — ABNORMAL HIGH (ref 11.5–15.5)
WBC: 6.2 10*3/uL (ref 4.0–10.5)

## 2015-12-02 LAB — PROTIME-INR
INR: 1.09 (ref 0.00–1.49)
Prothrombin Time: 14.3 seconds (ref 11.6–15.2)

## 2015-12-02 LAB — BRAIN NATRIURETIC PEPTIDE: B Natriuretic Peptide: 64.3 pg/mL (ref 0.0–100.0)

## 2015-12-02 MED ORDER — ALBUTEROL SULFATE (2.5 MG/3ML) 0.083% IN NEBU: 2.5000 mg | INHALATION_SOLUTION | Freq: Four times a day (QID) | RESPIRATORY_TRACT | Status: DC

## 2015-12-02 MED ORDER — CLOPIDOGREL BISULFATE 75 MG PO TABS
75.0000 mg | ORAL_TABLET | Freq: Every day | ORAL | Status: DC
  Administered 2015-12-03 – 2015-12-05 (×3): 75 mg via ORAL
  Filled 2015-12-02 (×3): qty 1

## 2015-12-02 MED ORDER — INSULIN ASPART 100 UNIT/ML ~~LOC~~ SOLN
24.0000 [IU] | Freq: Two times a day (BID) | SUBCUTANEOUS | Status: DC
  Administered 2015-12-03 – 2015-12-06 (×7): 24 [IU] via SUBCUTANEOUS

## 2015-12-02 MED ORDER — ASPIRIN EC 325 MG PO TBEC
325.0000 mg | DELAYED_RELEASE_TABLET | Freq: Every day | ORAL | Status: DC
  Administered 2015-12-03: 325 mg via ORAL
  Filled 2015-12-02: qty 1

## 2015-12-02 MED ORDER — SODIUM CHLORIDE 0.9% FLUSH: 3.0000 mL | INTRAVENOUS | Status: DC | PRN

## 2015-12-02 MED ORDER — FUROSEMIDE 40 MG PO TABS
60.0000 mg | ORAL_TABLET | Freq: Every day | ORAL | Status: DC
  Administered 2015-12-03 – 2015-12-04 (×2): 60 mg via ORAL
  Filled 2015-12-02 (×2): qty 1

## 2015-12-02 MED ORDER — LINACLOTIDE 145 MCG PO CAPS
145.0000 ug | ORAL_CAPSULE | ORAL | Status: DC
  Administered 2015-12-04: 145 ug via ORAL
  Filled 2015-12-02: qty 1

## 2015-12-02 MED ORDER — ACETAMINOPHEN 650 MG RE SUPP: 650.0000 mg | Freq: Four times a day (QID) | RECTAL | Status: DC | PRN

## 2015-12-02 MED ORDER — CARVEDILOL 12.5 MG PO TABS
12.5000 mg | ORAL_TABLET | Freq: Two times a day (BID) | ORAL | Status: DC
  Administered 2015-12-03 – 2015-12-06 (×7): 12.5 mg via ORAL
  Filled 2015-12-02 (×8): qty 1

## 2015-12-02 MED ORDER — HEPARIN BOLUS VIA INFUSION
4000.0000 [IU] | Freq: Once | INTRAVENOUS | Status: AC
  Administered 2015-12-02: 4000 [IU] via INTRAVENOUS
  Filled 2015-12-02: qty 4000

## 2015-12-02 MED ORDER — ONDANSETRON HCL 4 MG/2ML IJ SOLN: 4.0000 mg | Freq: Four times a day (QID) | INTRAMUSCULAR | Status: DC | PRN

## 2015-12-02 MED ORDER — SODIUM CHLORIDE 0.9% FLUSH
3.0000 mL | Freq: Two times a day (BID) | INTRAVENOUS | Status: DC
  Administered 2015-12-03 – 2015-12-05 (×2): 3 mL via INTRAVENOUS

## 2015-12-02 MED ORDER — HEPARIN (PORCINE) IN NACL 100-0.45 UNIT/ML-% IJ SOLN
1700.0000 [IU]/h | INTRAMUSCULAR | Status: DC
  Administered 2015-12-02: 1300 [IU]/h via INTRAVENOUS
  Administered 2015-12-03: 1600 [IU]/h via INTRAVENOUS
  Filled 2015-12-02 (×4): qty 250

## 2015-12-02 MED ORDER — IOPAMIDOL (ISOVUE-370) INJECTION 76%
100.0000 mL | Freq: Once | INTRAVENOUS | Status: AC | PRN
  Administered 2015-12-02: 100 mL via INTRAVENOUS

## 2015-12-02 MED ORDER — VITAMIN D 1000 UNITS PO TABS
4000.0000 [IU] | ORAL_TABLET | Freq: Every day | ORAL | Status: DC
  Administered 2015-12-03 – 2015-12-05 (×4): 4000 [IU] via ORAL
  Filled 2015-12-02 (×4): qty 4

## 2015-12-02 MED ORDER — INSULIN GLARGINE 100 UNIT/ML ~~LOC~~ SOLN
64.0000 [IU] | Freq: Every day | SUBCUTANEOUS | Status: DC
  Administered 2015-12-03 – 2015-12-05 (×4): 64 [IU] via SUBCUTANEOUS
  Filled 2015-12-02 (×5): qty 0.64

## 2015-12-02 MED ORDER — ISOSORBIDE MONONITRATE ER 60 MG PO TB24
60.0000 mg | ORAL_TABLET | Freq: Every day | ORAL | Status: DC
  Administered 2015-12-03 – 2015-12-05 (×4): 60 mg via ORAL
  Filled 2015-12-02 (×4): qty 1

## 2015-12-02 MED ORDER — SODIUM CHLORIDE 0.9 % IV SOLN: 250.0000 mL | INTRAVENOUS | Status: DC | PRN

## 2015-12-02 MED ORDER — MIRTAZAPINE 7.5 MG PO TABS
15.0000 mg | ORAL_TABLET | Freq: Every day | ORAL | Status: DC
  Administered 2015-12-03 – 2015-12-05 (×4): 15 mg via ORAL
  Filled 2015-12-02 (×4): qty 2

## 2015-12-02 MED ORDER — BUSPIRONE HCL 5 MG PO TABS
15.0000 mg | ORAL_TABLET | Freq: Every day | ORAL | Status: DC
  Administered 2015-12-03 – 2015-12-06 (×4): 15 mg via ORAL
  Filled 2015-12-02 (×4): qty 1

## 2015-12-02 MED ORDER — ALBUTEROL SULFATE (2.5 MG/3ML) 0.083% IN NEBU: 2.5000 mg | INHALATION_SOLUTION | Freq: Four times a day (QID) | RESPIRATORY_TRACT | Status: DC | PRN

## 2015-12-02 MED ORDER — PANTOPRAZOLE SODIUM 40 MG PO TBEC
40.0000 mg | DELAYED_RELEASE_TABLET | Freq: Every day | ORAL | Status: DC
  Administered 2015-12-03 – 2015-12-06 (×4): 40 mg via ORAL
  Filled 2015-12-02 (×4): qty 1

## 2015-12-02 MED ORDER — ZOLPIDEM TARTRATE 5 MG PO TABS: 5.0000 mg | ORAL_TABLET | Freq: Every evening | ORAL | Status: DC | PRN

## 2015-12-02 MED ORDER — INSULIN ASPART 100 UNIT/ML ~~LOC~~ SOLN: 0.0000 [IU] | Freq: Every day | SUBCUTANEOUS | Status: DC

## 2015-12-02 MED ORDER — FUROSEMIDE 10 MG/ML IJ SOLN
40.0000 mg | Freq: Once | INTRAMUSCULAR | Status: AC
  Administered 2015-12-02: 40 mg via INTRAVENOUS
  Filled 2015-12-02: qty 4

## 2015-12-02 MED ORDER — INSULIN ASPART 100 UNIT/ML ~~LOC~~ SOLN
0.0000 [IU] | Freq: Three times a day (TID) | SUBCUTANEOUS | Status: DC
  Administered 2015-12-03: 3 [IU] via SUBCUTANEOUS
  Administered 2015-12-03 – 2015-12-04 (×3): 2 [IU] via SUBCUTANEOUS
  Administered 2015-12-04 (×2): 3 [IU] via SUBCUTANEOUS
  Administered 2015-12-05: 2 [IU] via SUBCUTANEOUS
  Administered 2015-12-05: 5 [IU] via SUBCUTANEOUS
  Administered 2015-12-05 – 2015-12-06 (×2): 2 [IU] via SUBCUTANEOUS

## 2015-12-02 MED ORDER — EZETIMIBE 10 MG PO TABS
10.0000 mg | ORAL_TABLET | Freq: Every day | ORAL | Status: DC
  Administered 2015-12-03 – 2015-12-06 (×4): 10 mg via ORAL
  Filled 2015-12-02 (×4): qty 1

## 2015-12-02 MED ORDER — AMLODIPINE BESYLATE 5 MG PO TABS
5.0000 mg | ORAL_TABLET | Freq: Every day | ORAL | Status: DC
  Administered 2015-12-03 – 2015-12-05 (×4): 5 mg via ORAL
  Filled 2015-12-02 (×4): qty 1

## 2015-12-02 MED ORDER — ONDANSETRON HCL 4 MG PO TABS: 4.0000 mg | ORAL_TABLET | Freq: Four times a day (QID) | ORAL | Status: DC | PRN

## 2015-12-02 MED ORDER — ACETAMINOPHEN 325 MG PO TABS: 650.0000 mg | ORAL_TABLET | Freq: Four times a day (QID) | ORAL | Status: DC | PRN

## 2015-12-02 MED ORDER — NITROGLYCERIN 0.4 MG SL SUBL: 0.4000 mg | SUBLINGUAL_TABLET | SUBLINGUAL | Status: DC | PRN

## 2015-12-02 MED ORDER — SODIUM CHLORIDE 0.9% FLUSH
3.0000 mL | Freq: Two times a day (BID) | INTRAVENOUS | Status: DC
  Administered 2015-12-06: 3 mL via INTRAVENOUS

## 2015-12-02 MED ORDER — POTASSIUM CHLORIDE CRYS ER 20 MEQ PO TBCR
20.0000 meq | EXTENDED_RELEASE_TABLET | Freq: Every day | ORAL | Status: DC
  Administered 2015-12-03 – 2015-12-06 (×4): 20 meq via ORAL
  Filled 2015-12-02 (×4): qty 1

## 2015-12-02 MED ORDER — MORPHINE SULFATE (PF) 2 MG/ML IV SOLN: 2.0000 mg | INTRAVENOUS | Status: DC | PRN

## 2015-12-02 MED ORDER — QUETIAPINE FUMARATE 400 MG PO TABS
400.0000 mg | ORAL_TABLET | Freq: Every day | ORAL | Status: DC
  Administered 2015-12-03: 200 mg via ORAL
  Administered 2015-12-03: 400 mg via ORAL
  Administered 2015-12-04 – 2015-12-05 (×2): 200 mg via ORAL
  Filled 2015-12-02 (×2): qty 8
  Filled 2015-12-02: qty 1
  Filled 2015-12-02: qty 8

## 2015-12-02 MED ORDER — HYDROCODONE-ACETAMINOPHEN 10-325 MG PO TABS
1.0000 | ORAL_TABLET | Freq: Four times a day (QID) | ORAL | Status: DC | PRN
  Administered 2015-12-03 – 2015-12-05 (×4): 1 via ORAL
  Filled 2015-12-02 (×4): qty 1

## 2015-12-02 MED ORDER — DULOXETINE HCL 60 MG PO CPEP
60.0000 mg | ORAL_CAPSULE | Freq: Every day | ORAL | Status: DC
  Administered 2015-12-03 – 2015-12-05 (×4): 60 mg via ORAL
  Filled 2015-12-02 (×3): qty 1

## 2015-12-02 NOTE — ED Notes (Signed)
Monitor read 82% O2.  Upon entering the room, RN found pt had just sat back down after urinating.  He had the non-rebreather on his face but it was not hooked to the wall.  MD was bedside.  Pt was placed back on 5 L of O2 .  O2 levels increased to 94%.

## 2015-12-02 NOTE — ED Provider Notes (Signed)
CSN: [id number]     Arrival date & time 12/02/15  1649 History   First MD Initiated Contact with Patient 12/02/15 1658     Chief Complaint  Patient presents with  . Shortness of Breath     (Consider location/radiation/quality/duration/timing/severity/associated sxs/prior Treatment) HPI Comments: Tyler Le is a 70 y.o. male with history of T2DM, dyslipidemia, CAD s/p CABG 10/23/2015, HTN, CKD III, obesity presents to ED via EMS with complaint of shortness of breath. Shortness of breath started today when he got up to get his glasses in the bedroom. At home pulse ox recorded O2 sats at 71%. He was placed on NRB by EMS and O2 sats climbed to 98%. He currently denies chest pain, palpitations, cough, wheezing, fever, chill, or nightsweats. No dizziness, lightheadedness, or seizures. No abdominal complaints. No urinary complaints.   Patient is a 70 y.o. male presenting with shortness of breath. The history is provided by the patient and medical records.  Shortness of Breath Associated symptoms: sore throat     Past Medical History  Diagnosis Date  . Essential hypertension   . Dyslipidemia   . Coronary artery disease     a. 2001: s/p CABG X4;  b. 08/2010 s/p PCI/BMS to VG->RI->OM;  c. 06/2014 Cath/PCI: LM nl, LAD 115m, LCX 31m, RI 100, OM1 100, OM2 80, small, RCA 100ost, VG->RCA 100, VG->Diag 100, VG->RI->OM1 70-80 ISR (4.0x24 Promus DES), LIMA->LAD nl.  . Morbid obesity (HCC)   . Dysrhythmia   . History of blood transfusion 2001    "related to OHS"  . Anemia   . GERD (gastroesophageal reflux disease)   . Depression   . Anxiety   . Nephrolithiasis   . Complication of anesthesia     "they have a hard time waking him up"  . Type II diabetes mellitus (HCC)   . Arthritis     "back, neck" (07/11/2014)  . Chronic lower back pain   . Thrombocytopenia (HCC)     a. Borderline low platelets by labs noted in 06/2014 but also noted on prior labs as well.  . Renal insufficiency     a. noted  06/2014 - baseline unclear as no data since 2012.  Marland Kitchen RBBB    Past Surgical History  Procedure Laterality Date  . Cardiac catheterization  2001; ~ 2010  . Coronary artery bypass graft  2001    "CABG X 5"  . Appendectomy  ~ 1955  . Lumbar laminectomy  01/2011  . Back surgery    . Cholecystectomy    . Shoulder arthroscopy w/ rotator cuff repair Left 1990's  . Cardiovascular stress test  07/03/2009    EF 64%  . Left heart catheterization with coronary/graft angiogram N/A 05/09/2011    Procedure: LEFT HEART CATHETERIZATION WITH Isabel Caprice;  Surgeon: Kathleene Hazel, MD;  Location: [facility] CATH LAB;  Service: Cardiovascular;  Laterality: N/A;  . Tonsillectomy and adenoidectomy  1951  . Left heart catheterization with coronary angiogram N/A 07/11/2014    Procedure: LEFT HEART CATHETERIZATION WITH CORONARY ANGIOGRAM;  Surgeon: Peter M Swaziland, MD;  Location: [facility] CATH LAB;  Service: Cardiovascular;  Laterality: N/A;  . Coronary angioplasty with stent placement  March 2012    BMS to SVG to intermediate/OM  . Coronary angioplasty with stent placement  07/11/2014    "1"  . Cardiac catheterization N/A 10/16/2015    Procedure: Left Heart Cath and Cors/Grafts Angiography;  Surgeon: Corky Crafts, MD;  Location: [facility] INVASIVE CV LAB;  Service: Cardiovascular;  Laterality: N/A;  . Coronary artery bypass graft N/A 10/20/2015    Procedure: REDO CORONARY ARTERY BYPASS GRAFTING (CABG), TIMES THREE, USING LEFT GREATER SAPHENOUS VEIN HARVESTED ENDOSCOPICALLY AND CRYO VEIN, WITH CORONARY ENDARTERECTOMY;  Surgeon: Kerin Perna, MD;  Location: [facility] OR;  Service: Open Heart Surgery;  Laterality: N/A;  SVG to LAD, SVG to OM, CRYOVEIN to RCA with Endarterectomy  . Tee without cardioversion N/A 10/20/2015    Procedure: TRANSESOPHAGEAL ECHOCARDIOGRAM (TEE);  Surgeon: Kerin Perna, MD;  Location: [facility] OR;  Service: Open Heart Surgery;  Laterality: N/A;  . Endarterectomy Right 10/20/2015    Procedure:  ENDARTERECTOMY CAROTID;  Surgeon: Pryor Ochoa, MD;  Location: [facility] OR;  Service: Vascular;  Laterality: Right;   Family History  Problem Relation Age of Onset  . Heart disease Mother   . Heart disease Father   . Heart attack Father   . Heart disease Brother    Social History  Substance Use Topics  . Smoking status: Former Smoker -- 4.00 packs/day for 25 years    Types: Cigarettes  . Smokeless tobacco: Former Neurosurgeon    Types: Chew     Comment: "quit smoking ~ 1985; chewed off and on for a few years; quit chewing in ~ 1985 too"  . Alcohol Use: Yes     Comment: 07/11/2014 "might take a drink 5X/yr"    Review of Systems  HENT: Positive for sore throat.   Respiratory: Positive for shortness of breath.   Cardiovascular: Positive for leg swelling ( b/l).  Musculoskeletal: Positive for arthralgias ( generalized).  All other systems reviewed and are negative.     Allergies  Metformin and related; Pravachol; Ramipril; and Wellbutrin  Home Medications   Prior to Admission medications   Medication Sig Start Date End Date Taking? Authorizing Provider  amLODipine (NORVASC) 5 MG tablet Take 1 tablet (5 mg total) by mouth daily. Patient taking differently: Take 5 mg by mouth at bedtime.  01/17/15  Yes Cassell Clement, MD  Artificial Tear Ointment (DRY EYES OP) Place 1 drop into both eyes daily as needed (for dry eyes).   Yes Historical Provider, MD  aspirin EC 325 MG EC tablet Take 1 tablet (325 mg total) by mouth daily. Patient taking differently: Take 325 mg by mouth at bedtime.  11/13/15  Yes Erin R Barrett, PA-C  busPIRone (BUSPAR) 15 MG tablet Take 15 mg by mouth daily. 08/22/15  Yes Historical Provider, MD  carvedilol (COREG) 25 MG tablet Take 0.5 tablets (12.5 mg total) by mouth 2 (two) times daily with a meal. Patient taking differently: Take 25 mg by mouth at bedtime.  07/12/14  Yes Dayna N Dunn, PA-C  Cholecalciferol (VITAMIN D3) 2000 UNITS TABS Take 4,000 Units by mouth at bedtime.     Yes Historical Provider, MD  clopidogrel (PLAVIX) 75 MG tablet Take 1 tablet (75 mg total) by mouth daily. Patient taking differently: Take 75 mg by mouth every morning.  11/13/15  Yes Erin R Barrett, PA-C  CYMBALTA 60 MG capsule Take 60 mg by mouth at bedtime.  03/30/12  Yes Historical Provider, MD  ezetimibe (ZETIA) 10 MG tablet Take 1 tablet (10 mg total) by mouth daily. Patient taking differently: Take 10 mg by mouth every morning.  11/13/15  Yes Erin R Barrett, PA-C  HYDROcodone-acetaminophen (NORCO) 10-325 MG tablet Take 1 tablet by mouth every 6 (six) hours as needed for moderate pain. Patient taking differently: Take 0.5-2 tablets by mouth every 6 (six) hours as needed  for moderate pain.  11/13/15  Yes Erin R Barrett, PA-C  insulin glargine (LANTUS) 100 UNIT/ML injection Inject 64 Units into the skin at bedtime.    Yes Historical Provider, MD  isosorbide mononitrate (IMDUR) 60 MG 24 hr tablet Take 60 mg by mouth at bedtime. 10/11/15  Yes Historical Provider, MD  LINZESS 145 MCG CAPS capsule Take 145 mcg by mouth every other day.  09/28/15  Yes Historical Provider, MD  mirtazapine (REMERON) 15 MG tablet Take 15 mg by mouth at bedtime.  03/30/12  Yes Historical Provider, MD  nitroGLYCERIN (NITROSTAT) 0.4 MG SL tablet Place 1 tablet (0.4 mg total) under the tongue every 5 (five) minutes as needed for chest pain (up to 3 doses). 06/14/14 12/02/15 Yes Cassell Clement, MD  NOVOLOG FLEXPEN 100 UNIT/ML FlexPen Inject 24 Units into the skin 2 (two) times daily.  07/01/13  Yes Historical Provider, MD  pantoprazole (PROTONIX) 40 MG tablet Take 1 tablet (40 mg total) by mouth daily. 11/14/15  Yes Erin R Barrett, PA-C  potassium chloride SA (K-DUR,KLOR-CON) 20 MEQ tablet Take 1 tablet (20 mEq total) by mouth daily. Patient taking differently: Take 20 mEq by mouth every morning.  11/14/15  Yes Erin R Barrett, PA-C  QUEtiapine (SEROQUEL) 400 MG tablet Take 400 mg by mouth at bedtime.   Yes Historical Provider, MD    furosemide (LASIX) 40 MG tablet Take 1 tablet (40 mg total) by mouth daily. 11/13/15   Erin R Barrett, PA-C   BP 127/83 mmHg  Pulse 113  Temp(Src) 98.4 F (36.9 C) (Oral)  Resp 26  SpO2 96% Physical Exam  Constitutional: He appears well-developed and well-nourished. No distress.  HENT:  Head: Normocephalic and atraumatic.  Mouth/Throat: Uvula is midline and oropharynx is clear and moist. Mucous membranes are dry. No trismus in the jaw. No oropharyngeal exudate.  Eyes: Conjunctivae and EOM are normal. Pupils are equal, round, and reactive to light. Right eye exhibits no discharge. Left eye exhibits no discharge. No scleral icterus.  Neck: Normal range of motion. Neck supple.  Cardiovascular: Regular rhythm, normal heart sounds and intact distal pulses.  Tachycardia present.   No murmur heard. 1+ pitting edema in lower extremities b/l  Pulmonary/Chest: Effort normal and breath sounds normal. No respiratory distress. He exhibits no tenderness, no bony tenderness and no crepitus.  Well healing surgical incision.   Abdominal: Soft. Bowel sounds are normal. He exhibits no pulsatile midline mass. There is no tenderness. There is no rigidity, no rebound and no guarding.  Obese abdomen.   Musculoskeletal: Normal range of motion.  Lymphadenopathy:    He has no cervical adenopathy.  Neurological: He is alert. Coordination normal.  Skin: Skin is warm and dry. He is not diaphoretic.     Psychiatric: He has a normal mood and affect.    ED Course  Procedures (including critical care time) Labs Review Labs Reviewed  CBC - Abnormal; Notable for the following:    RBC 4.01 (*)    Hemoglobin 11.1 (*)    HCT 37.0 (*)    RDW 15.8 (*)    All other components within normal limits  BASIC METABOLIC PANEL - Abnormal; Notable for the following:    Glucose, Bld 178 (*)    GFR calc non Af Amer 58 (*)    All other components within normal limits  TROPONIN I - Abnormal; Notable for the following:     Troponin I 0.04 (*)    All other components within normal limits  I-STAT ARTERIAL BLOOD GAS, ED - Abnormal; Notable for the following:    pO2, Arterial 174.0 (*)    Bicarbonate 25.3 (*)    All other components within normal limits  PROTIME-INR  BRAIN NATRIURETIC PEPTIDE  HEPARIN LEVEL (UNFRACTIONATED)  CBC    Imaging Review Dg Chest 2 View  12/02/2015  CLINICAL DATA:  Severe shortness of breath when walking at home today. Recent CABG. EXAM: CHEST  2 VIEW COMPARISON:  11/08/2015. FINDINGS: Stable enlarged cardiac silhouette and post CABG changes. The tracheostomy tube has been removed. Small amount of linear density at both lung bases. Stable small left pleural effusion. Thoracic spine degenerative changes. IMPRESSION: 1. Interval small amount of linear atelectasis or scarring at both lung bases. 2. Stable cardiomegaly. 3. Stable small left pleural effusion. Electronically Signed   By: Beckie SaltsSteven  Reid M.D.   On: 12/02/2015 19:01   Ct Angio Chest Pe W/cm &/or Wo Cm  12/02/2015  CLINICAL DATA:  70 year old male with history of recent open heart surgery presenting with shortness of breath EXAM: CT ANGIOGRAPHY CHEST WITH CONTRAST TECHNIQUE: Multidetector CT imaging of the chest was performed using the standard protocol during bolus administration of intravenous contrast. Multiplanar CT image reconstructions and MIPs were obtained to evaluate the vascular anatomy. CONTRAST:  100 cc Isovue 370 COMPARISON:  Chest radiograph dated 12/02/2015 FINDINGS: The lungs are clear. There is a small left pleural effusion. No pneumothorax. The central airways are patent. There is mild moderate atherosclerotic calcification of the thoracic aorta. There is mild dilatation of the superior aspect of the descending thoracic aorta measuring up to 3.6 cm in diameter. The origins of the great vessels of the aortic arch appear patent. There is a large saddle embolus straddling the bifurcation of the main pulmonary trunk and  extending to the central pulmonary arteries bilaterally. There is mild enlargement of the right ventricle and right atrium with flattening of the interventricular septum compatible with a degree of right cardiac straining. Correlation with clinical exam and echocardiogram recommended. There is postsurgical changes of CABG with multiple vascular clips. There is a 2.5 x 2.7 cm loculated high attenuating fluid superior to the pericardium on the left likely a focal seroma or hematoma. There is no hilar or mediastinal adenopathy. The esophagus is grossly unremarkable. There is an ill-defined 2 cm right thyroid nodule with calcified focus. There is no axillary adenopathy. Median sternotomy wires and midline anterior chest wall surgical incision. No hematoma. There is degenerative change of of the spine. No acute fracture. There is retrograde flow of contrast from the right atrium into the IVC compatible with a degree of right cardiac dysfunction. Review of the MIP images confirms the above findings. IMPRESSION: Large saddle embolus with findings of right cardiac straining. Postsurgical changes of CABG. Small loculated fluid superior to the pericardium on the left may represent small hematoma or seroma. These results were called by telephone at the time of interpretation on 12/02/2015 at 9:23 pm to Dr. Glynn OctaveSTEPHEN RANCOUR , who verbally acknowledged these results. Electronically Signed   By: Elgie CollardArash  Radparvar M.D.   On: 12/02/2015 21:31   I have personally reviewed and evaluated these images and lab results as part of my medical decision-making.   EKG Interpretation   Date/Time:  Saturday December 02 2015 17:02:14 EDT Ventricular Rate:  117 PR Interval:  109 QRS Duration: 157 QT Interval:  373 QTC Calculation: 520 R Axis:   77 Text Interpretation:  Sinus tachycardia Right bundle branch block Inferior  infarct, age  indeterminate No significant change was found Confirmed by  Manus Gunning  MD, STEPHEN [phone]) on 12/02/2015  5:17:32 PM      MDM   Final diagnoses:  Hypoxemia   Patient is afebrile and non-toxic. He is tachycardic and lying in bed with a non-rebreather at 8L O2. Physical exam remarkable for tachycardia and b/l lower extremity swelling. Concern for CHF vs. ACS vs. PNA vs. PE vs. PTX. Check CBC, BMP, BNP, troponin, EKG, CXR, ABG.   BNP normal. ABG re-assuring. CBC remarkable for mildly low hgb at 11.1. BMP remarkable for elevated glucose. Troponin positive at 0.04. EKG shows sinus tachycardia with RBBB, no acute change from previous. Heart score 5. CXR shows linear atelectasis in both lung bases and small stable pleural effusion. Patient is persistently tachycardic and drops O2 sats with movement. Well's score 6. CTA ordered to r/o PE. Given elevated troponin, heart score of 5, and new onset oxygen demand will consult hospitals for admission.   Dr. Sharyon Medicus consulted. Agrees to see patient. Patient to be admitted for further evaluation and management.   9:23PM: CT phoned Dr. Manus Gunning. Patient has large saddle pulmonary embolus with right heart strain. Incidental findings include superior hematoma to pericardium question of hematoma vs. Seroma. Heparin ordered by Dr. Sharyon Medicus. Dr. Manus Gunning consulted cardiothoracic surgery.     Lona Kettle, PA-C 12/02/15 2155  Lona Kettle, PA-C 12/02/15 2205  Glynn Octave, MD 12/03/15 [phone]

## 2015-12-02 NOTE — ED Notes (Signed)
Per EMS:  Pt with a hx of recent open heart surgery in May.  Pt began to experience severe SOB with walking at home.  Pt has a hx of panic attacks, but sts this does not feel the same.  Pt has a home SpO2 monitor, and found he was 72%.  EMS placed him NRB and pt has been saturating 98%.  Pt denies any recent cough, but states his wife has been sick.  Bilateral swelling to lower extremities is new.

## 2015-12-02 NOTE — H&P (Addendum)
[facility]                                                                                    Patient Demographics  Tyler Le, is a 70 y.o. male  CSN: [id number]  MRN: [id number]  DOB - 10-Dec-1945  Admit Date - 12/02/2015  Outpatient Primary MD for the patient is Tyler Mask, MD   With History of -  Past Medical History  Diagnosis Date  . Essential hypertension   . Dyslipidemia   . Coronary artery disease     a. 2001: s/p CABG X4;  b. 08/2010 s/p PCI/BMS to VG->RI->OM;  c. 06/2014 Cath/PCI: LM nl, LAD 124m, LCX 60m, RI 100, OM1 100, OM2 80, small, RCA 100ost, VG->RCA 100, VG->Diag 100, VG->RI->OM1 70-80 ISR (4.0x24 Promus DES), LIMA->LAD nl.  . Morbid obesity (HCC)   . Dysrhythmia   . History of blood transfusion 2001    "related to OHS"  . Anemia   . GERD (gastroesophageal reflux disease)   . Depression   . Anxiety   . Nephrolithiasis   . Complication of anesthesia     "they have a hard time waking him up"  . Type II diabetes mellitus (HCC)   . Arthritis     "back, neck" (07/11/2014)  . Chronic lower back pain   . Thrombocytopenia (HCC)     a. Borderline low platelets by labs noted in 06/2014 but also noted on prior labs as well.  . Renal insufficiency     a. noted 06/2014 - baseline unclear as no data since 2012.  Marland Kitchen RBBB       Past Surgical History  Procedure Laterality Date  . Cardiac catheterization  2001; ~ 2010  . Coronary artery bypass graft  2001    "CABG X 5"  . Appendectomy  ~ 1955  . Lumbar laminectomy  01/2011  . Back surgery    . Cholecystectomy    . Shoulder arthroscopy w/ rotator cuff repair Left 1990's  . Cardiovascular stress test  07/03/2009    EF 64%  . Left heart catheterization with coronary/graft angiogram N/A 05/09/2011    Procedure: LEFT HEART CATHETERIZATION WITH Isabel Caprice;  Surgeon: Kathleene Hazel, MD;  Location: [facility] CATH LAB;  Service: Cardiovascular;  Laterality: N/A;  . Tonsillectomy and  adenoidectomy  1951  . Left heart catheterization with coronary angiogram N/A 07/11/2014    Procedure: LEFT HEART CATHETERIZATION WITH CORONARY ANGIOGRAM;  Surgeon: Peter M Swaziland, MD;  Location: [facility] CATH LAB;  Service: Cardiovascular;  Laterality: N/A;  . Coronary angioplasty with stent placement  March 2012    BMS to SVG to intermediate/OM  . Coronary angioplasty with stent placement  07/11/2014    "1"  . Cardiac catheterization N/A 10/16/2015    Procedure: Left Heart Cath and Cors/Grafts Angiography;  Surgeon: Corky Crafts, MD;  Location: [facility] INVASIVE CV LAB;  Service: Cardiovascular;  Laterality: N/A;  . Coronary artery bypass graft N/A 10/20/2015    Procedure: REDO CORONARY ARTERY BYPASS GRAFTING (CABG), TIMES THREE, USING LEFT GREATER SAPHENOUS VEIN HARVESTED ENDOSCOPICALLY AND CRYO VEIN, WITH CORONARY ENDARTERECTOMY;  Surgeon: Kerin Perna, MD;  Location: [facility]  OR;  Service: Open Heart Surgery;  Laterality: N/A;  SVG to LAD, SVG to OM, CRYOVEIN to RCA with Endarterectomy  . Tee without cardioversion N/A 10/20/2015    Procedure: TRANSESOPHAGEAL ECHOCARDIOGRAM (TEE);  Surgeon: Kerin Perna, MD;  Location: [facility] OR;  Service: Open Heart Surgery;  Laterality: N/A;  . Endarterectomy Right 10/20/2015    Procedure: ENDARTERECTOMY CAROTID;  Surgeon: Pryor Ochoa, MD;  Location: [facility] OR;  Service: Vascular;  Laterality: Right;    in for   Chief Complaint  Patient presents with  . Shortness of Breath     HPI  Tyler Le  is a 70 y.o. male, With past medical history significant for coronary artery disease status post CABG in 2001 and redo 1 2017, peripheral vascular disease status post endarterectomy on April 2017 who was recently discharged home after a prolonged hospitalization in March and April of this year presenting with increased dyspnea on exertion and shortness of breath for the last 2 days. He noticed increased swelling in the lower extremities the last few days. Patient denies any  chest pains palpitations fevers chills nausea or vomiting. Patient received Lasix IV in the emergency room and his symptoms improved . Patient reports mild cough and he reports that people at his house home with him might have been sick . Patient doesn't want to stay for a long time in the hospital. In the emergency room his chest x-ray did not show any significant abnormalities and his BNP was normal . His d-dimer's were elevated and his CT of chest with angiogram is still pending when seen. Last echo done in April 2017 showed an ejection fraction of 55-60%.    Review of Systems    In addition to the HPI above,  No Fever-chills, No Headache, No changes with Vision or hearing, No problems swallowing food or Liquids, No Chest pain,  No Abdominal pain, No Nausea or Vommitting, Bowel movements are regular, No Blood in stool or Urine, No dysuria, No new skin rashes or bruises, No new joints pains-aches,  No new weakness, tingling, numbness in any extremity, No recent weight gain or loss, No polyuria, polydypsia or polyphagia, No significant Mental Stressors.  A full 10 point Review of Systems was done, except as stated above, all other Review of Systems were negative.   Social History Social History  Substance Use Topics  . Smoking status: Former Smoker -- 4.00 packs/day for 25 years    Types: Cigarettes  . Smokeless tobacco: Former Neurosurgeon    Types: Chew     Comment: "quit smoking ~ 1985; chewed off and on for a few years; quit chewing in ~ 1985 too"  . Alcohol Use: Yes     Comment: 07/11/2014 "might take a drink 5X/yr"     Family History Family History  Problem Relation Age of Onset  . Heart disease Mother   . Heart disease Father   . Heart attack Father   . Heart disease Brother      Prior to Admission medications   Medication Sig Start Date End Date Taking? Authorizing Provider  amLODipine (NORVASC) 5 MG tablet Take 1 tablet (5 mg total) by mouth daily. Patient taking  differently: Take 5 mg by mouth at bedtime.  01/17/15  Yes Cassell Clement, MD  Artificial Tear Ointment (DRY EYES OP) Place 1 drop into both eyes daily as needed (for dry eyes).   Yes Historical Provider, MD  aspirin EC 325 MG EC tablet Take 1 tablet (325 mg  total) by mouth daily. Patient taking differently: Take 325 mg by mouth at bedtime.  11/13/15  Yes Erin R Barrett, PA-C  busPIRone (BUSPAR) 15 MG tablet Take 15 mg by mouth daily. 08/22/15  Yes Historical Provider, MD  carvedilol (COREG) 25 MG tablet Take 0.5 tablets (12.5 mg total) by mouth 2 (two) times daily with a meal. Patient taking differently: Take 25 mg by mouth at bedtime.  07/12/14  Yes Dayna N Dunn, PA-C  Cholecalciferol (VITAMIN D3) 2000 UNITS TABS Take 4,000 Units by mouth at bedtime.    Yes Historical Provider, MD  clopidogrel (PLAVIX) 75 MG tablet Take 1 tablet (75 mg total) by mouth daily. Patient taking differently: Take 75 mg by mouth every morning.  11/13/15  Yes Erin R Barrett, PA-C  CYMBALTA 60 MG capsule Take 60 mg by mouth at bedtime.  03/30/12  Yes Historical Provider, MD  ezetimibe (ZETIA) 10 MG tablet Take 1 tablet (10 mg total) by mouth daily. Patient taking differently: Take 10 mg by mouth every morning.  11/13/15  Yes Erin R Barrett, PA-C  HYDROcodone-acetaminophen (NORCO) 10-325 MG tablet Take 1 tablet by mouth every 6 (six) hours as needed for moderate pain. Patient taking differently: Take 0.5-2 tablets by mouth every 6 (six) hours as needed for moderate pain.  11/13/15  Yes Erin R Barrett, PA-C  insulin glargine (LANTUS) 100 UNIT/ML injection Inject 64 Units into the skin at bedtime.    Yes Historical Provider, MD  isosorbide mononitrate (IMDUR) 60 MG 24 hr tablet Take 60 mg by mouth at bedtime. 10/11/15  Yes Historical Provider, MD  LINZESS 145 MCG CAPS capsule Take 145 mcg by mouth every other day.  09/28/15  Yes Historical Provider, MD  mirtazapine (REMERON) 15 MG tablet Take 15 mg by mouth at bedtime.  03/30/12  Yes  Historical Provider, MD  nitroGLYCERIN (NITROSTAT) 0.4 MG SL tablet Place 1 tablet (0.4 mg total) under the tongue every 5 (five) minutes as needed for chest pain (up to 3 doses). 06/14/14 12/02/15 Yes Cassell Clement, MD  NOVOLOG FLEXPEN 100 UNIT/ML FlexPen Inject 24 Units into the skin 2 (two) times daily.  07/01/13  Yes Historical Provider, MD  pantoprazole (PROTONIX) 40 MG tablet Take 1 tablet (40 mg total) by mouth daily. 11/14/15  Yes Erin R Barrett, PA-C  potassium chloride SA (K-DUR,KLOR-CON) 20 MEQ tablet Take 1 tablet (20 mEq total) by mouth daily. Patient taking differently: Take 20 mEq by mouth every morning.  11/14/15  Yes Erin R Barrett, PA-C  QUEtiapine (SEROQUEL) 400 MG tablet Take 400 mg by mouth at bedtime.   Yes Historical Provider, MD  furosemide (LASIX) 40 MG tablet Take 1 tablet (40 mg total) by mouth daily. 11/13/15   Erin R Barrett, PA-C    Allergies  Allergen Reactions  . Metformin And Related Nausea And Vomiting  . Pravachol Nausea And Vomiting  . Ramipril Nausea Only  . Wellbutrin [Bupropion Hcl] Nausea And Vomiting    Physical Exam  Vitals  Blood pressure 127/83, pulse 113, temperature 98.4 F (36.9 C), temperature source Oral, resp. rate 26, SpO2 96 %.   1. General Obese, tired gentleman in moderate respiratory distress. Looks tired.  2. Normal affect and insight, Not Suicidal or Homicidal, Awake Alert, Oriented X 3.  3. No F.N deficits, grossly.  4. Ears and Eyes appear Normal, Conjunctivae clear, PERRLA. Moist Oral Mucosa.  5. Supple Neck, No JVD, No cervical lymphadenopathy appriciated, No Carotid Bruits.  6. Symmetrical Chest wall movement, Good  air movement bilaterally, decreased breath sounds at the bases. Scars of old surgery were well-healed  7. RRR, No Gallops, Rubs or Murmurs, No Parasternal Heave.  8. Positive Bowel Sounds, Abdomen Soft, Non tender, No organomegaly appriciated,No rebound -guarding or rigidity.  9.  No Cyanosis, Normal Skin  Turgor, trace lower extremity edema noted.  10. Good muscle tone,  joints appear normal , no effusions, Normal ROM.    Data Review  CBC  Recent Labs Lab 12/02/15 1719  WBC 6.2  HGB 11.1*  HCT 37.0*  PLT 162  MCV 92.3  MCH 27.7  MCHC 30.0  RDW 15.8*   ------------------------------------------------------------------------------------------------------------------  Chemistries   Recent Labs Lab 12/02/15 1719  NA 138  K 4.5  CL 105  CO2 26  GLUCOSE 178*  BUN 9  CREATININE 1.21  CALCIUM 9.0   ------------------------------------------------------------------------------------------------------------------ CrCl cannot be calculated (Unknown ideal weight.). ------------------------------------------------------------------------------------------------------------------ No results for input(s): TSH, T4TOTAL, T3FREE, THYROIDAB in the last 72 hours.  Invalid input(s): FREET3   Coagulation profile  Recent Labs Lab 12/02/15 1719  INR 1.09   ------------------------------------------------------------------------------------------------------------------- No results for input(s): DDIMER in the last 72 hours. -------------------------------------------------------------------------------------------------------------------  Cardiac Enzymes  Recent Labs Lab 12/02/15 1719  TROPONINI 0.04*   ------------------------------------------------------------------------------------------------------------------ Invalid input(s): POCBNP   ---------------------------------------------------------------------------------------------------------------  Urinalysis    Component Value Date/Time   COLORURINE AMBER* 10/29/2015 1310   APPEARANCEUR CLEAR 10/29/2015 1310   LABSPEC 1.025 10/29/2015 1310   PHURINE 6.0 10/29/2015 1310   GLUCOSEU NEGATIVE 10/29/2015 1310   HGBUR TRACE* 10/29/2015 1310   BILIRUBINUR SMALL* 10/29/2015 1310   KETONESUR NEGATIVE 10/29/2015 1310    PROTEINUR 30* 10/29/2015 1310   UROBILINOGEN 1.0 08/01/2014 1417   NITRITE NEGATIVE 10/29/2015 1310   LEUKOCYTESUR NEGATIVE 10/29/2015 1310    ----------------------------------------------------------------------------------------------------------------   Imaging results:   Dg Chest 2 View  12/02/2015  CLINICAL DATA:  Severe shortness of breath when walking at home today. Recent CABG. EXAM: CHEST  2 VIEW COMPARISON:  11/08/2015. FINDINGS: Stable enlarged cardiac silhouette and post CABG changes. The tracheostomy tube has been removed. Small amount of linear density at both lung bases. Stable small left pleural effusion. Thoracic spine degenerative changes. IMPRESSION: 1. Interval small amount of linear atelectasis or scarring at both lung bases. 2. Stable cardiomegaly. 3. Stable small left pleural effusion. Electronically Signed   By: Beckie Salts M.D.   On: 12/02/2015 19:01   Dg Chest 2 View  11/08/2015  CLINICAL DATA:  Post CABG, soreness and weakness EXAM: CHEST  2 VIEW COMPARISON:  Portable chest x-ray of 11/07/2015 FINDINGS: Aeration of the lung bases has improved. There is still a small left pleural effusion present with mild left basilar atelectasis. Tracheostomy is unchanged in position. Right central venous line remains. Heart size is stable. IMPRESSION: Slightly better aeration. Persistent left basilar atelectasis and small left pleural effusion. Electronically Signed   By: Dwyane Dee M.D.   On: 11/08/2015 08:13   Dg Chest Port 1 View  11/07/2015  CLINICAL DATA:  Redo CABG EXAM: PORTABLE CHEST 1 VIEW FINDINGS: Tubular device is stable. Cardiomegaly stable. Left basilar opacity likely a combination of volume loss and pleural fluid is stable. Volume loss at the right base has improved. IMPRESSION: Improved volume loss at the right base. Stable left basilar opacity is described. Electronically Signed   By: Jolaine Click M.D.   On: 11/07/2015 07:51   Dg Chest Port 1 View  11/05/2015   CLINICAL DATA:  Post CABG EXAM: PORTABLE CHEST 1 VIEW COMPARISON:  11/04/2015 FINDINGS: Cardiomegaly again noted. Status post CABG. Stable tracheostomy tube position. Central vascular congestion without pulmonary edema. Stable small left pleural effusion and left basilar atelectasis or infiltrate. There is no pneumothorax. Stable NG tube position. IMPRESSION: Status post CABG. No pulmonary edema. Persistent small left pleural effusion left basilar atelectasis or infiltrate. Electronically Signed   By: Natasha Mead M.D.   On: 11/05/2015 10:04   Dg Chest Port 1 View  11/04/2015  CLINICAL DATA:  Ileus, status post CABG 10/20/2015 EXAM: PORTABLE CHEST 1 VIEW COMPARISON:  11/03/2015 FINDINGS: Cardiomegaly again noted. Status post CABG. Stable tracheostomy tube position. There is small left pleural effusion with left basilar atelectasis or infiltrate. No pulmonary edema. Stable right arm PICC line position with tip in SVC right atrium junction. NG tube in place. IMPRESSION: Stable tracheostomy tube position. There is small left pleural effusion with left basilar atelectasis or infiltrate. No pulmonary edema. Stable right arm PICC line position with tip in SVC right atrium junction. NG tube in place. Electronically Signed   By: Natasha Mead M.D.   On: 11/04/2015 11:31   Dg Chest Port 1 View  11/03/2015  CLINICAL DATA:  Pulmonary vascular congestion EXAM: PORTABLE CHEST 1 VIEW COMPARISON:  Nov 02, 2015 FINDINGS: Tracheostomy catheter tip is 4.0 cm above carina. Central catheter tip is in the superior vena cava. No pneumothorax. Airspace consolidation in the bases remains stable. No new opacity. Heart is enlarged. The pulmonary vascularity is normal. IMPRESSION: Stable cardiac prominence. Airspace consolidation is noted in the lower lobe regions, more severe on the left than on the right, likely a combination of atelectasis and pneumonia. Tube and catheter positions as described without pneumothorax. Electronically Signed    By: Bretta Bang III M.D.   On: 11/03/2015 08:13   Dg Abd Portable 1v  11/04/2015  CLINICAL DATA:  70 year old male with history of ileus. EXAM: PORTABLE ABDOMEN - 1 VIEW COMPARISON:  11/03/2015. FINDINGS: Tip of feeding tube appears to be in the distal second portion of the duodenum. Gas and stool are seen scattered throughout the colon extending to the level of the distal rectum. No pathologic distension of small bowel is noted. No gross evidence of pneumoperitoneum. Rectal thermister in place. Epicardial pacing wires projecting over the lower thorax and upper abdomen. IMPRESSION: 1. Support apparatus and postoperative changes, as above. 2. Nonobstructive bowel gas pattern. 3. No pneumoperitoneum. Electronically Signed   By: Trudie Reed M.D.   On: 11/04/2015 11:56   Dg Abd Portable 1v  11/03/2015  CLINICAL DATA:  Encounter for feeding tube placement EXAM: PORTABLE ABDOMEN - 1 VIEW COMPARISON:  Portable exam 0902 hours compared to 11/03/2015 at 0650 hours FINDINGS: New feeding tube with tip projecting over duodenal bulb. Question pacing wires. Nonobstructive bowel gas pattern. Bones unremarkable. IMPRESSION: Tip of feeding tube projects over duodenal bulb. Electronically Signed   By: Ulyses Southward M.D.   On: 11/03/2015 09:15   Dg Abd Portable 1v  11/03/2015  CLINICAL DATA:  Bowel ileus EXAM: PORTABLE ABDOMEN - 1 VIEW COMPARISON:  Nov 02, 2015 FINDINGS: There is mild colonic dilatation. There is no appreciable small bowel dilatation. No air-fluid levels are noted. No free air is seen on this supine examination. Previous nasogastric tube is not appreciable currently. IMPRESSION: Evidence a degree of colonic ileus. No demonstrable small bowel obstruction or free air. Electronically Signed   By: Bretta Bang III M.D.   On: 11/03/2015 08:14   Dg Swallowing Func-speech Pathology  11/07/2015  Objective  Swallowing Evaluation: Type of Study: MBS-Modified Barium Swallow Study Patient Details Name:  WITTEN CERTAIN MRN: [id number] Date of Birth: 26-Nov-1945 Today's Date: 11/07/2015 Time: SLP Start Time (ACUTE ONLY): 1013-SLP Stop Time (ACUTE ONLY): 1028 SLP Time Calculation (min) (ACUTE ONLY): 15 min Past Medical History: Past Medical History Diagnosis Date . Essential hypertension  . Dyslipidemia  . Coronary artery disease    a. 2001: s/p CABG X4;  b. 08/2010 s/p PCI/BMS to VG->RI->OM;  c. 06/2014 Cath/PCI: LM nl, LAD 143m, LCX 54m, RI 100, OM1 100, OM2 80, small, RCA 100ost, VG->RCA 100, VG->Diag 100, VG->RI->OM1 70-80 ISR (4.0x24 Promus DES), LIMA->LAD nl. . Morbid obesity (HCC)  . Dysrhythmia  . History of blood transfusion 2001   "related to OHS" . Anemia  . GERD (gastroesophageal reflux disease)  . Depression  . Anxiety  . Nephrolithiasis  . Complication of anesthesia    "they have a hard time waking him up" . Type II diabetes mellitus (HCC)  . Arthritis    "back, neck" (07/11/2014) . Chronic lower back pain  . Thrombocytopenia (HCC)    a. Borderline low platelets by labs noted in 06/2014 but also noted on prior labs as well. . Renal insufficiency    a. noted 06/2014 - baseline unclear as no data since 2012. Marland Kitchen RBBB  Past Surgical History: Past Surgical History Procedure Laterality Date . Cardiac catheterization  2001; ~ 2010 . Coronary artery bypass graft  2001   "CABG X 5" . Appendectomy  ~ 1955 . Lumbar laminectomy  01/2011 . Back surgery   . Cholecystectomy   . Shoulder arthroscopy w/ rotator cuff repair Left 1990's . Cardiovascular stress test  07/03/2009   EF 64% . Left heart catheterization with coronary/graft angiogram N/A 05/09/2011   Procedure: LEFT HEART CATHETERIZATION WITH Isabel Caprice;  Surgeon: Kathleene Hazel, MD;  Location: [facility] CATH LAB;  Service: Cardiovascular;  Laterality: N/A; . Tonsillectomy and adenoidectomy  1951 . Left heart catheterization with coronary angiogram N/A 07/11/2014   Procedure: LEFT HEART CATHETERIZATION WITH CORONARY ANGIOGRAM;  Surgeon: Peter M Swaziland, MD;   Location: [facility] CATH LAB;  Service: Cardiovascular;  Laterality: N/A; . Coronary angioplasty with stent placement  March 2012   BMS to SVG to intermediate/OM . Coronary angioplasty with stent placement  07/11/2014   "1" . Cardiac catheterization N/A 10/16/2015   Procedure: Left Heart Cath and Cors/Grafts Angiography;  Surgeon: Corky Crafts, MD;  Location: [facility] INVASIVE CV LAB;  Service: Cardiovascular;  Laterality: N/A; . Coronary artery bypass graft N/A 10/20/2015   Procedure: REDO CORONARY ARTERY BYPASS GRAFTING (CABG), TIMES THREE, USING LEFT GREATER SAPHENOUS VEIN HARVESTED ENDOSCOPICALLY AND CRYO VEIN, WITH CORONARY ENDARTERECTOMY;  Surgeon: Kerin Perna, MD;  Location: [facility] OR;  Service: Open Heart Surgery;  Laterality: N/A;  SVG to LAD, SVG to OM, CRYOVEIN to RCA with Endarterectomy . Tee without cardioversion N/A 10/20/2015   Procedure: TRANSESOPHAGEAL ECHOCARDIOGRAM (TEE);  Surgeon: Kerin Perna, MD;  Location: [facility] OR;  Service: Open Heart Surgery;  Laterality: N/A; . Endarterectomy Right 10/20/2015   Procedure: ENDARTERECTOMY CAROTID;  Surgeon: Pryor Ochoa, MD;  Location: [facility] OR;  Service: Vascular;  Laterality: Right; HPI: pt presents with Cardiac Arrest and Redo of CABG x3. pt intubated 4/24 and with Trach on 5/5. pt with hx of CABG, Anxiety, HTN, DM, CAD, Renal Insufficiency, and Back Surgeries.  Subjective: "I'm tired" Assessment / Plan / Recommendation CHL IP CLINICAL IMPRESSIONS 11/07/2015 Therapy Diagnosis Mild pharyngeal phase dysphagia Clinical Impression Pt  has a mild pharyngeal dysphagia due to delay in swallow trigger and reduced movement of his hyolaryngeal musculature, leading to incomplete airway protection during the swallow and moderate vallecular residue. Silent penetration occurs with thin liquids, requiring cues for multiple throat clears and coughs to clear. Nectar thick liquids reach the pyriform sinuses prior to swallwo triggger, also putting them at risk for airway invasion,  although they are better contained during single cup sips. He appears to have good sensation of residue in his valleculae, as he spontaneously brings the residue back into his oral cavity and reswallows until it is better cleared. Recommend Dys 2 diet to reduce the amount of pharyngeal residue and nectar thick liquids by cup. Pt should have PMV in place during all PO intake. Will continue to follow and progress as able. Impact on safety and function Mild aspiration risk   CHL IP TREATMENT RECOMMENDATION 11/07/2015 Treatment Recommendations Therapy as outlined in treatment plan below   Prognosis 11/07/2015 Prognosis for Safe Diet Advancement Good Barriers to Reach Goals -- Barriers/Prognosis Comment -- CHL IP DIET RECOMMENDATION 11/07/2015 SLP Diet Recommendations Dysphagia 2 (Fine chop) solids;Nectar thick liquid Liquid Administration via Cup;No straw Medication Administration Crushed with puree Compensations Slow rate;Small sips/bites;Multiple dry swallows after each bite/sip Postural Changes Seated upright at 90 degrees   CHL IP OTHER RECOMMENDATIONS 11/07/2015 Recommended Consults -- Oral Care Recommendations Oral care BID Other Recommendations Order thickener from pharmacy;Prohibited food (jello, ice cream, thin soups);Remove water pitcher   CHL IP FOLLOW UP RECOMMENDATIONS 11/07/2015 Follow up Recommendations Inpatient Rehab   CHL IP FREQUENCY AND DURATION 11/07/2015 Speech Therapy Frequency (ACUTE ONLY) min 2x/week Treatment Duration 2 weeks      CHL IP ORAL PHASE 11/07/2015 Oral Phase WFL Oral - Pudding Teaspoon -- Oral - Pudding Cup -- Oral - Honey Teaspoon -- Oral - Honey Cup -- Oral - Nectar Teaspoon -- Oral - Nectar Cup -- Oral - Nectar Straw -- Oral - Thin Teaspoon -- Oral - Thin Cup -- Oral - Thin Straw -- Oral - Puree -- Oral - Mech Soft -- Oral - Regular -- Oral - Multi-Consistency -- Oral - Pill -- Oral Phase - Comment --  CHL IP PHARYNGEAL PHASE 11/07/2015 Pharyngeal Phase Impaired Pharyngeal- Pudding  Teaspoon -- Pharyngeal -- Pharyngeal- Pudding Cup -- Pharyngeal -- Pharyngeal- Honey Teaspoon -- Pharyngeal -- Pharyngeal- Honey Cup -- Pharyngeal -- Pharyngeal- Nectar Teaspoon -- Pharyngeal -- Pharyngeal- Nectar Cup Delayed swallow initiation-vallecula;Reduced epiglottic inversion;Reduced anterior laryngeal mobility;Reduced laryngeal elevation;Reduced airway/laryngeal closure;Reduced tongue base retraction;Pharyngeal residue - valleculae Pharyngeal -- Pharyngeal- Nectar Straw Reduced epiglottic inversion;Reduced anterior laryngeal mobility;Reduced laryngeal elevation;Reduced airway/laryngeal closure;Reduced tongue base retraction;Pharyngeal residue - valleculae;Delayed swallow initiation-pyriform sinuses Pharyngeal -- Pharyngeal- Thin Teaspoon -- Pharyngeal -- Pharyngeal- Thin Cup Reduced epiglottic inversion;Reduced anterior laryngeal mobility;Reduced laryngeal elevation;Reduced airway/laryngeal closure;Reduced tongue base retraction;Pharyngeal residue - valleculae;Delayed swallow initiation-pyriform sinuses;Penetration/Aspiration before swallow Pharyngeal Material enters airway, remains ABOVE vocal cords and not ejected out Pharyngeal- Thin Straw -- Pharyngeal -- Pharyngeal- Puree Delayed swallow initiation-vallecula;Reduced epiglottic inversion;Reduced anterior laryngeal mobility;Reduced laryngeal elevation;Reduced airway/laryngeal closure;Reduced tongue base retraction;Pharyngeal residue - valleculae Pharyngeal -- Pharyngeal- Mechanical Soft Delayed swallow initiation-vallecula;Reduced epiglottic inversion;Reduced anterior laryngeal mobility;Reduced laryngeal elevation;Reduced airway/laryngeal closure;Reduced tongue base retraction;Pharyngeal residue - valleculae Pharyngeal -- Pharyngeal- Regular -- Pharyngeal -- Pharyngeal- Multi-consistency -- Pharyngeal -- Pharyngeal- Pill -- Pharyngeal -- Pharyngeal Comment --  CHL IP CERVICAL ESOPHAGEAL PHASE 11/07/2015 Cervical Esophageal Phase WFL Pudding Teaspoon --  Pudding Cup -- Honey Teaspoon -- Honey Cup -- Nectar Teaspoon -- Nectar Cup --  Nectar Straw -- Thin Teaspoon -- Thin Cup -- Thin Straw -- Puree -- Mechanical Soft -- Regular -- Multi-consistency -- Pill -- Cervical Esophageal Comment -- No flowsheet data found. Maxcine HamLaura Paiewonsky, M.A. CCC-SLP [phone] Maxcine Hamaiewonsky, Laura 11/07/2015, 1:20 PM               My personal review of EKG: Regular rate and rhythm at the rate of 1 17 bpm with right bundle branch block, chronic  Assessment & Plan  1. Acute hypoxemia with new oxygen requirement, patient now is on 4 L/m by nasal cannula    Rule out MI    Awaiting CT/angiogram    No acute findings by chest x-ray    BNP is normal    Lasix increased to 60 mg by mouth daily    Fluid restriction    Continue with aspirin and Plavix 2. History of coronary artery disease     Status post CABG 2 3. History of peripheral vascular disease status post endarterectomy on April 2017 4. Renal failure; monitor after CT angiogram 5. Diabetes mellitus type 2     Continue with Lantus/insulin sliding scale   DVT Prophylaxis Lovenox  AM Labs Ordered, also please review Full Orders  Code Status full  Disposition Plan: Home  Time spent in minutes : 42  Condition GUARDED  Addendum: Patient's CT angiogram came with a positive saddle pulmonary embolus.PCCM consulted and patient will be started on IV heparin. TPA might be dangerous at this time, may have to discuss with cardiothoracic surgery, discussed with ER attending.  @SIGNATURE @

## 2015-12-02 NOTE — ED Notes (Signed)
RN came into the room and found the pt standing attempting to urinate w/o his O2 on b/c " I could not stand w/ it on... It would not reach"  Pt reported he could not lay in thge bed anymore b/c "by back is killing me."  RN found pt a recliner chair and assisted pt to it.  Once RN re-attached monitors and found him at 79%.  O2 would not reach above 85% on 5 L [place].  RN placed pt on non-rebreather and his O2 reach 100% quickly.  RN placed pt back on 4L O2 [place].

## 2015-12-02 NOTE — ED Notes (Signed)
RN went in to give meds and found urine in the urinal.  Pt denied standing up to urinate stating "you told me to wait for you but I have to now."  After MD informed pt of PE and RN explained the importance of not moving around too much he admitted to me that he did stand up.  Instructed pt to please wait for assistance.  He stated he would.

## 2015-12-02 NOTE — Progress Notes (Signed)
ANTICOAGULATION CONSULT NOTE - Initial Consult  Pharmacy Consult for Heparin Indication: PE rule out  Allergies  Allergen Reactions  . Metformin And Related Nausea And Vomiting  . Pravachol Nausea And Vomiting  . Ramipril Nausea Only  . Wellbutrin [Bupropion Hcl] Nausea And Vomiting    Patient Measurements: Heparin Dosing Weight: 88 kg Weight = 127 kg (approx)  Vital Signs: Temp: 98.4 F (36.9 C) (06/10 1705) Temp Source: Oral (06/10 1705) BP: 127/83 mmHg (06/10 1745) Pulse Rate: 113 (06/10 2000)  Labs:  Recent Labs  12/02/15 1719  HGB 11.1*  HCT 37.0*  PLT 162  LABPROT 14.3  INR 1.09  CREATININE 1.21  TROPONINI 0.04*    CrCl cannot be calculated (Unknown ideal weight.).   Medical History: Past Medical History  Diagnosis Date  . Essential hypertension   . Dyslipidemia   . Coronary artery disease     a. 2001: s/p CABG X4;  b. 08/2010 s/p PCI/BMS to VG->RI->OM;  c. 06/2014 Cath/PCI: LM nl, LAD 15857m, LCX 3837m, RI 100, OM1 100, OM2 80, small, RCA 100ost, VG->RCA 100, VG->Diag 100, VG->RI->OM1 70-80 ISR (4.0x24 Promus DES), LIMA->LAD nl.  . Morbid obesity (HCC)   . Dysrhythmia   . History of blood transfusion 2001    "related to OHS"  . Anemia   . GERD (gastroesophageal reflux disease)   . Depression   . Anxiety   . Nephrolithiasis   . Complication of anesthesia     "they have a hard time waking him up"  . Type II diabetes mellitus (HCC)   . Arthritis     "back, neck" (07/11/2014)  . Chronic lower back pain   . Thrombocytopenia (HCC)     a. Borderline low platelets by labs noted in 06/2014 but also noted on prior labs as well.  . Renal insufficiency     a. noted 06/2014 - baseline unclear as no data since 2012.  Marland Kitchen. RBBB      Assessment: 70 year old male s/p recent discharge(5/23) post CABG redo, now readmitted for rule out PE  Goal of Therapy:  Heparin level 0.3-0.7 units/ml Monitor platelets by anticoagulation protocol: Yes   Plan:  Heparin 4000  units iv bolus x 1 Heparin drip at 1300 units / hr Heparin level and CBC daily  Thank you Tyler Le, PharmD [phone]  Tyler Le, Tyler Le 12/02/2015,9:28 PM

## 2015-12-02 NOTE — ED Notes (Signed)
Patient transported to CT 

## 2015-12-03 ENCOUNTER — Inpatient Hospital Stay (HOSPITAL_COMMUNITY): Payer: Medicare Other

## 2015-12-03 DIAGNOSIS — Z951 Presence of aortocoronary bypass graft: Secondary | ICD-10-CM

## 2015-12-03 DIAGNOSIS — I2699 Other pulmonary embolism without acute cor pulmonale: Secondary | ICD-10-CM

## 2015-12-03 DIAGNOSIS — I313 Pericardial effusion (noninflammatory): Secondary | ICD-10-CM

## 2015-12-03 DIAGNOSIS — R079 Chest pain, unspecified: Secondary | ICD-10-CM | POA: Insufficient documentation

## 2015-12-03 DIAGNOSIS — I10 Essential (primary) hypertension: Secondary | ICD-10-CM

## 2015-12-03 LAB — GLUCOSE, CAPILLARY
GLUCOSE-CAPILLARY: 149 mg/dL — AB (ref 65–99)
GLUCOSE-CAPILLARY: 209 mg/dL — AB (ref 65–99)
Glucose-Capillary: 131 mg/dL — ABNORMAL HIGH (ref 65–99)
Glucose-Capillary: 157 mg/dL — ABNORMAL HIGH (ref 65–99)

## 2015-12-03 LAB — CBC
HCT: 34.1 % — ABNORMAL LOW (ref 39.0–52.0)
HCT: 37.4 % — ABNORMAL LOW (ref 39.0–52.0)
HEMOGLOBIN: 11.4 g/dL — AB (ref 13.0–17.0)
Hemoglobin: 10.3 g/dL — ABNORMAL LOW (ref 13.0–17.0)
MCH: 27.5 pg (ref 26.0–34.0)
MCH: 27.9 pg (ref 26.0–34.0)
MCHC: 30.2 g/dL (ref 30.0–36.0)
MCHC: 30.5 g/dL (ref 30.0–36.0)
MCV: 90.9 fL (ref 78.0–100.0)
MCV: 91.7 fL (ref 78.0–100.0)
PLATELETS: 167 10*3/uL (ref 150–400)
PLATELETS: 183 10*3/uL (ref 150–400)
RBC: 3.75 MIL/uL — AB (ref 4.22–5.81)
RBC: 4.08 MIL/uL — AB (ref 4.22–5.81)
RDW: 15.9 % — AB (ref 11.5–15.5)
RDW: 15.9 % — ABNORMAL HIGH (ref 11.5–15.5)
WBC: 5.3 10*3/uL (ref 4.0–10.5)
WBC: 6.4 10*3/uL (ref 4.0–10.5)

## 2015-12-03 LAB — BASIC METABOLIC PANEL
Anion gap: 9 (ref 5–15)
BUN: 11 mg/dL (ref 6–20)
CALCIUM: 9 mg/dL (ref 8.9–10.3)
CO2: 28 mmol/L (ref 22–32)
CREATININE: 1.34 mg/dL — AB (ref 0.61–1.24)
Chloride: 102 mmol/L (ref 101–111)
GFR calc non Af Amer: 51 mL/min — ABNORMAL LOW (ref >60)
GFR, EST AFRICAN AMERICAN: 59 mL/min — AB (ref >60)
Glucose, Bld: 158 mg/dL — ABNORMAL HIGH (ref 65–99)
Potassium: 3.8 mmol/L (ref 3.5–5.1)
SODIUM: 139 mmol/L (ref 135–145)

## 2015-12-03 LAB — MRSA PCR SCREENING: MRSA BY PCR: NEGATIVE

## 2015-12-03 LAB — TROPONIN I
TROPONIN I: 0.06 ng/mL — AB (ref <0.031)
TROPONIN I: 0.06 ng/mL — AB (ref <0.031)
Troponin I: 0.06 ng/mL — ABNORMAL HIGH (ref <0.031)

## 2015-12-03 LAB — HEPARIN LEVEL (UNFRACTIONATED)
HEPARIN UNFRACTIONATED: 0.22 [IU]/mL — AB (ref 0.30–0.70)
Heparin Unfractionated: 0.43 IU/mL (ref 0.30–0.70)

## 2015-12-03 MED ORDER — HEPARIN BOLUS VIA INFUSION
2000.0000 [IU] | Freq: Once | INTRAVENOUS | Status: AC
  Administered 2015-12-03: 2000 [IU] via INTRAVENOUS
  Filled 2015-12-03: qty 2000

## 2015-12-03 NOTE — Progress Notes (Addendum)
  Subjective: Patient examined and CTA of chest personally reviewed Oxygen saturation and shortness of breath significantly improved with IV heparin Patient should continue Plavix 75 mg day for redo CABG with cryopreserved vein - no aspirin Okay to start  NOAC / Coumadin when felt appropriate by internal medicine  Objective: Vital signs in last 24 hours: Temp:  [97.6 F (36.4 C)-98.4 F (36.9 C)] 98 F (36.7 C) (06/11 0759) Pulse Rate:  [104-117] 110 (06/11 1102) Cardiac Rhythm:  [-] Sinus tachycardia (06/11 1100) Resp:  [18-28] 19 (06/11 1102) BP: (97-144)/(62-91) 97/62 mmHg (06/11 1102) SpO2:  [87 %-100 %] 96 % (06/11 1102) Weight:  [281 lb 3.2 oz (127.551 kg)] 281 lb 3.2 oz (127.551 kg) (06/10 2311)  Hemodynamic parameters for last 24 hours:  sinus rhythm stable blood pressure  Intake/Output from previous day: 06/10 0701 - 06/11 0700 In: 331.4 [P.O.:240; I.V.:91.4] Out: 1650 [Urine:1650] Intake/Output this shift: Total I/O In: 480 [P.O.:480] Out: 400 [Urine:400]  Patient breathing comfortably in bed Breath sounds clear Heart rhythm regular without murmur Sternal incision and leg incisions both healing well Minimal pedal edema Neuro intact  Lab Results:  Recent Labs  12/02/15 2349 12/03/15 0413  WBC 6.4 5.3  HGB 11.4* 10.3*  HCT 37.4* 34.1*  PLT 183 167   BMET:  Recent Labs  12/02/15 1719 12/03/15 0413  NA 138 139  K 4.5 3.8  CL 105 102  CO2 26 28  GLUCOSE 178* 158*  BUN 9 11  CREATININE 1.21 1.34*  CALCIUM 9.0 9.0    PT/INR:  Recent Labs  12/02/15 1719  LABPROT 14.3  INR 1.09   ABG    Component Value Date/Time   PHART 7.363 12/02/2015 1805   HCO3 25.3* 12/02/2015 1805   TCO2 27 12/02/2015 1805   ACIDBASEDEF 1.0 10/20/2015 1824   O2SAT 100.0 12/02/2015 1805   CBG (last 3)   Recent Labs  12/02/15 2327 12/03/15 0723 12/03/15 1127  GLUCAP 172* 131* 149*    Assessment/Plan: S/P   Severe respiratory distress from pulmonary  embolus, improved on heparin Will follow  LOS: 1 day    Tyler Le 12/03/2015

## 2015-12-03 NOTE — Progress Notes (Addendum)
VASCULAR LAB PRELIMINARY  PRELIMINARY  PRELIMINARY  PRELIMINARY  Bilateral lower extremity venous duplex completed.    Preliminary report:  There is acute, non occlusive DVT noted in the right common femoral vein and SVT noted at the saphenofemoral junction.    Gave report to Beazer HomesChrissy, Charity fundraiserN.  Stayce Delancy, RVT 12/03/2015, 7:03 PM

## 2015-12-03 NOTE — Progress Notes (Signed)
I notified Provider on call about DVT results. No new orders. Pt on IV heparin

## 2015-12-03 NOTE — Progress Notes (Signed)
Utilization review completed.  

## 2015-12-03 NOTE — Consult Note (Signed)
PULMONARY / CRITICAL CARE MEDICINE Consult Note   Name: Tyler Le MRN: [id number] DOB: [birth year]    ADMISSION DATE:  12/02/2015 CONSULTATION DATE:  12/02/15  REFERRING MD:  Tyler CurieAli Hijazi, MD  CHIEF COMPLAINT:  Dyspnea  HISTORY OF PRESENT ILLNESS:   [name] is a 70 y/o man with a hx of recent redo CABG (4/28) with prolonged hospitalization and prolonged vent dependence requiring percuntanous  tracheostomy placement (10/27/15) and was decannulated 5/21. He was discharged to a SNF, and eventually went home. He presented to the ED with dyspnea and was found to have a saddle PE. He had a positive troponin (0.04) and some radiographic evidence of right heart strain. RV/LV ratio was not reported. Given these concerns, PCCM was consulted regarding advanced therapies for PE.  PAST MEDICAL HISTORY :  He  has a past medical history of Essential hypertension; Dyslipidemia; Coronary artery disease; Morbid obesity (HCC); Dysrhythmia; History of blood transfusion (2001); Anemia; GERD (gastroesophageal reflux disease); Depression; Anxiety; Nephrolithiasis; Complication of anesthesia; Type II diabetes mellitus (HCC); Arthritis; Chronic lower back pain; Thrombocytopenia (HCC); Renal insufficiency; and RBBB.  PAST SURGICAL HISTORY: He  has past surgical history that includes Cardiac catheterization (2001; ~ 2010); Coronary artery bypass graft (2001); Appendectomy (~ 1955); Lumbar laminectomy (01/2011); Back surgery; Cholecystectomy; Shoulder arthroscopy w/ rotator cuff repair (Left, 1990's); Cardiovascular stress test (07/03/2009); left heart catheterization with coronary/graft angiogram (N/A, 05/09/2011); Tonsillectomy and adenoidectomy (1951); left heart catheterization with coronary angiogram (N/A, 07/11/2014); Coronary angioplasty with stent (March 2012); Coronary angioplasty with stent (07/11/2014); Cardiac catheterization (N/A, 10/16/2015); Coronary artery bypass graft (N/A, 10/20/2015); TEE without cardioversion  (N/A, 10/20/2015); and Endarterectomy (Right, 10/20/2015).  Allergies  Allergen Reactions  . Metformin And Related Nausea And Vomiting  . Pravachol Nausea And Vomiting  . Ramipril Nausea Only  . Wellbutrin [Bupropion Hcl] Nausea And Vomiting    No current facility-administered medications on file prior to encounter.   Current Outpatient Prescriptions on File Prior to Encounter  Medication Sig  . amLODipine (NORVASC) 5 MG tablet Take 1 tablet (5 mg total) by mouth daily. (Patient taking differently: Take 5 mg by mouth at bedtime. )  . Artificial Tear Ointment (DRY EYES OP) Place 1 drop into both eyes daily as needed (for dry eyes).  Marland Kitchen. aspirin EC 325 MG EC tablet Take 1 tablet (325 mg total) by mouth daily. (Patient taking differently: Take 325 mg by mouth at bedtime. )  . busPIRone (BUSPAR) 15 MG tablet Take 15 mg by mouth daily.  . carvedilol (COREG) 25 MG tablet Take 0.5 tablets (12.5 mg total) by mouth 2 (two) times daily with a meal. (Patient taking differently: Take 25 mg by mouth at bedtime. )  . Cholecalciferol (VITAMIN D3) 2000 UNITS TABS Take 4,000 Units by mouth at bedtime.   . clopidogrel (PLAVIX) 75 MG tablet Take 1 tablet (75 mg total) by mouth daily. (Patient taking differently: Take 75 mg by mouth every morning. )  . CYMBALTA 60 MG capsule Take 60 mg by mouth at bedtime.   Marland Kitchen. ezetimibe (ZETIA) 10 MG tablet Take 1 tablet (10 mg total) by mouth daily. (Patient taking differently: Take 10 mg by mouth every morning. )  . HYDROcodone-acetaminophen (NORCO) 10-325 MG tablet Take 1 tablet by mouth every 6 (six) hours as needed for moderate pain. (Patient taking differently: Take 0.5-2 tablets by mouth every 6 (six) hours as needed for moderate pain. )  . insulin glargine (LANTUS) 100 UNIT/ML injection Inject 64 Units into the skin at  bedtime.   Marland Kitchen LINZESS 145 MCG CAPS capsule Take 145 mcg by mouth every other day.   . mirtazapine (REMERON) 15 MG tablet Take 15 mg by mouth at bedtime.   .  nitroGLYCERIN (NITROSTAT) 0.4 MG SL tablet Place 1 tablet (0.4 mg total) under the tongue every 5 (five) minutes as needed for chest pain (up to 3 doses).  . NOVOLOG FLEXPEN 100 UNIT/ML FlexPen Inject 24 Units into the skin 2 (two) times daily.   . pantoprazole (PROTONIX) 40 MG tablet Take 1 tablet (40 mg total) by mouth daily.  . potassium chloride SA (K-DUR,KLOR-CON) 20 MEQ tablet Take 1 tablet (20 mEq total) by mouth daily. (Patient taking differently: Take 20 mEq by mouth every morning. )  . QUEtiapine (SEROQUEL) 400 MG tablet Take 400 mg by mouth at bedtime.  . furosemide (LASIX) 40 MG tablet Take 1 tablet (40 mg total) by mouth daily.    FAMILY HISTORY:  His indicated that his mother is deceased. He indicated that his father is deceased. He indicated that his brother is alive. He indicated that his maternal grandmother is deceased. He indicated that his maternal grandfather is deceased. He indicated that his paternal grandmother is deceased. He indicated that his paternal grandfather is deceased.   SOCIAL HISTORY: He  reports that he has quit smoking. His smoking use included Cigarettes. He has a 100 pack-year smoking history. He has quit using smokeless tobacco. His smokeless tobacco use included Chew. He reports that he drinks alcohol. He reports that he does not use illicit drugs.  VITAL SIGNS: BP 144/87 mmHg  Pulse 115  Temp(Src) 97.6 F (36.4 C) (Oral)  Resp 28  Ht 6\' 3"  (1.905 m)  Wt 281 lb 3.2 oz (127.551 kg)  BMI 35.15 kg/m2  SpO2 99%  PHYSICAL EXAMINATION: General:  Obese man, no acute distress Neuro:  Awake, alert, oriented HEENT: moist mucus membranes Cardiovascular:  Elevated HR, Sternotomy scar seen Lungs:  CTAB Abdomen:  obese, nontender Musculoskeletal:  Deferred  Skin:  No rashes on visible skin  LABS:  BMET  Recent Labs Lab 12/02/15 1719  NA 138  K 4.5  CL 105  CO2 26  BUN 9  CREATININE 1.21  GLUCOSE 178*    Electrolytes  Recent Labs Lab  12/02/15 1719  CALCIUM 9.0    CBC  Recent Labs Lab 12/02/15 1719 12/02/15 2349  WBC 6.2 6.4  HGB 11.1* 11.4*  HCT 37.0* 37.4*  PLT 162 183    Coag's  Recent Labs Lab 12/02/15 1719  INR 1.09    Sepsis Markers No results for input(s): LATICACIDVEN, PROCALCITON, O2SATVEN in the last 168 hours.  ABG  Recent Labs Lab 12/02/15 1805  PHART 7.363  PCO2ART 44.5  PO2ART 174.0*    Liver Enzymes No results for input(s): AST, ALT, ALKPHOS, BILITOT, ALBUMIN in the last 168 hours.  Cardiac Enzymes  Recent Labs Lab 12/02/15 1719  TROPONINI 0.04*    Glucose  Recent Labs Lab 12/02/15 2327  GLUCAP 172*    Imaging Dg Chest 2 View  12/02/2015  CLINICAL DATA:  Severe shortness of breath when walking at home today. Recent CABG. EXAM: CHEST  2 VIEW COMPARISON:  11/08/2015. FINDINGS: Stable enlarged cardiac silhouette and post CABG changes. The tracheostomy tube has been removed. Small amount of linear density at both lung bases. Stable small left pleural effusion. Thoracic spine degenerative changes. IMPRESSION: 1. Interval small amount of linear atelectasis or scarring at both lung bases. 2. Stable cardiomegaly. 3. Stable  small left pleural effusion. Electronically Signed   By: Beckie Salts M.D.   On: 12/02/2015 19:01   Ct Angio Chest Pe W/cm &/or Wo Cm  12/02/2015  CLINICAL DATA:  70 year old male with history of recent open heart surgery presenting with shortness of breath EXAM: CT ANGIOGRAPHY CHEST WITH CONTRAST TECHNIQUE: Multidetector CT imaging of the chest was performed using the standard protocol during bolus administration of intravenous contrast. Multiplanar CT image reconstructions and MIPs were obtained to evaluate the vascular anatomy. CONTRAST:  100 cc Isovue 370 COMPARISON:  Chest radiograph dated 12/02/2015 FINDINGS: The lungs are clear. There is a small left pleural effusion. No pneumothorax. The central airways are patent. There is mild moderate  atherosclerotic calcification of the thoracic aorta. There is mild dilatation of the superior aspect of the descending thoracic aorta measuring up to 3.6 cm in diameter. The origins of the great vessels of the aortic arch appear patent. There is a large saddle embolus straddling the bifurcation of the main pulmonary trunk and extending to the central pulmonary arteries bilaterally. There is mild enlargement of the right ventricle and right atrium with flattening of the interventricular septum compatible with a degree of right cardiac straining. Correlation with clinical exam and echocardiogram recommended. There is postsurgical changes of CABG with multiple vascular clips. There is a 2.5 x 2.7 cm loculated high attenuating fluid superior to the pericardium on the left likely a focal seroma or hematoma. There is no hilar or mediastinal adenopathy. The esophagus is grossly unremarkable. There is an ill-defined 2 cm right thyroid nodule with calcified focus. There is no axillary adenopathy. Median sternotomy wires and midline anterior chest wall surgical incision. No hematoma. There is degenerative change of of the spine. No acute fracture. There is retrograde flow of contrast from the right atrium into the IVC compatible with a degree of right cardiac dysfunction. Review of the MIP images confirms the above findings. IMPRESSION: Large saddle embolus with findings of right cardiac straining. Postsurgical changes of CABG. Small loculated fluid superior to the pericardium on the left may represent small hematoma or seroma. These results were called by telephone at the time of interpretation on 12/02/2015 at 9:23 pm to Dr. Glynn Octave , who verbally acknowledged these results. Electronically Signed   By: Elgie Collard M.D.   On: 12/02/2015 21:31    ASSESSMENT / PLAN:  PULMONARY A: Acute submassive PE, provoked P:   Agree with treatment with heparin infusion. No hemodynamic instability. Given recent surgery  to his chest (CABG) 6weeks and 1 day ago as well as neck surgery (perc trach) within the last six weeks, high concern for major bleeding risks with tPA, even in reduced doses (EKOS).  Jamie Kato, MD Pulmonary and Critical Care Medicine [facility] Pager: [phone]  12/03/2015, 12:21 AM

## 2015-12-03 NOTE — Progress Notes (Signed)
ANTICOAGULATION CONSULT NOTE - Follow Up Consult  Pharmacy Consult for Heparin  Indication: Saddle PE  Allergies  Allergen Reactions  . Metformin And Related Nausea And Vomiting  . Pravachol Nausea And Vomiting  . Ramipril Nausea Only  . Wellbutrin [Bupropion Hcl] Nausea And Vomiting    Patient Measurements: Height: 6\' 3"  (190.5 cm) Weight: 281 lb 3.2 oz (127.551 kg) IBW/kg (Calculated) : 84.5  Vital Signs: Temp: 97.6 F (36.4 C) (06/10 2311) BP: 115/72 mmHg (06/11 0523) Pulse Rate: 111 (06/11 0523)  Labs:  Recent Labs  12/02/15 1719 12/02/15 2349 12/03/15 0413  HGB 11.1* 11.4* 10.3*  HCT 37.0* 37.4* 34.1*  PLT 162 183 167  LABPROT 14.3  --   --   INR 1.09  --   --   HEPARINUNFRC  --   --  0.22*  CREATININE 1.21  --   --   TROPONINI 0.04* 0.06*  --     Estimated Creatinine Clearance: 79.4 mL/min (by C-G formula based on Cr of 1.21).  Assessment: Heparin for new saddle PE, pt had redo CABG at end of April 2017, HL is low this AM, no issues per RN.   Goal of Therapy:  Heparin level 0.3-0.7 units/ml Monitor platelets by anticoagulation protocol: Yes   Plan:  -Heparin 2000 units BOLUS -Increase heparin drip to 1600 units/hr -1400 HL  Ameilia Rattan 12/03/2015,5:37 AM

## 2015-12-03 NOTE — Progress Notes (Signed)
ANTICOAGULATION CONSULT NOTE  Pharmacy Consult for Heparin Indication: PE   Allergies  Allergen Reactions  . Metformin And Related Nausea And Vomiting  . Pravachol Nausea And Vomiting  . Ramipril Nausea Only  . Wellbutrin [Bupropion Hcl] Nausea And Vomiting    Patient Measurements: Heparin Dosing Weight: 88 kg Weight = 127 kg (approx)  Vital Signs: Temp: 97.7 F (36.5 C) (06/11 1235) Temp Source: Oral (06/11 1235) BP: 106/65 mmHg (06/11 1235) Pulse Rate: 108 (06/11 1235)  Labs:  Recent Labs  12/02/15 1719 12/02/15 2349 12/03/15 0413 12/03/15 1148 12/03/15 1516  HGB 11.1* 11.4* 10.3*  --   --   HCT 37.0* 37.4* 34.1*  --   --   PLT 162 183 167  --   --   LABPROT 14.3  --   --   --   --   INR 1.09  --   --   --   --   HEPARINUNFRC  --   --  0.22*  --  0.43  CREATININE 1.21  --  1.34*  --   --   TROPONINI 0.04* 0.06* 0.06* 0.06*  --     Estimated Creatinine Clearance: 71.7 mL/min (by C-G formula based on Cr of 1.34).   Assessment: 70 year old male continues on heparin for PE Heparin level now therapeutic at 0.43  Goal of Therapy:  Heparin level 0.3-0.7 units/ml Monitor platelets by anticoagulation protocol: Yes   Plan:  Heparin to 1650 units / hr Follow up AM labs  Monitor for conversion to po  Thank you Okey RegalLisa Erynn Vaca, PharmD [phone]  12/03/2015,3:38 PM

## 2015-12-03 NOTE — Progress Notes (Signed)
[facility]                                                                              Patient Demographics  Tyler Le, is a 70 y.o. male, DOB - [birth year], ZOX:[id number]  Admit date - 12/02/2015   Admitting Physician Tyler Curie, MD  Outpatient Primary MD for the patient is Tyler Mask, MD  Outpatient specialists:   LOS - 1  days    Chief Complaint  Patient presents with  . Shortness of Breath       Brief summary  Patient is a 70 year old male with CAD status post CABG in 4/ 2017, peripheral vascular disease status post endarterectomy 4/17 who was recently discharged home after a prolonged hospitalization in April presented with increased dyspnea on exertion and shortness of breath for the last 2 days. He also noticed swelling in the lower extremities in the last few days. CT angiogram of the chest showed large saddle pulmonary embolism. Patient was admitted for further workup.    Assessment & Plan    Principal Problem:   Acute hypoxic respiratory failure with saddle Pulmonary embolism (HCC) - Continue IV heparin drip - 2-D echo, Doppler ultrasound of the lower extremities - Pulmonology following, high risk for major bleeding with TPA given recent surgeries - Discussed in detail with the patient and wife at the bedside, prefers NOAC over warfarin/lovenox or heparin. Will defer to pulmonology when it is feasible to convert to NOAC's. Will continue IV heparin for now   Active Problems:   S/P CABG (coronary artery bypass graft), CAD - Currently stable, continue amlodipine, aspirin, Coreg, Plavix, Zetia, Lasix - Mild elevation in the troponins likely due to large saddle emboli, follow 2-D echo    Type II diabetes mellitus (HCC) - Continue sliding scale and Lantus    Essential hypertension - Currently stable, continue Coreg, Lasix  CKD stage III - Follow creatinine function closely   Code Status: Full CODE STATUS DVT Prophylaxis:  Heparin  drip Family Communication: Discussed in detail with the patient, all imaging results, lab results explained to the patient and wife   Disposition Plan:   Time Spent in minutes   *25 minutes  Procedures:  CT angiogram of the chest  Consultants:   Pulmonology  Antimicrobials:      Medications  Scheduled Meds: . amLODipine  5 mg Oral QHS  . aspirin EC  325 mg Oral QHS  . busPIRone  15 mg Oral Daily  . carvedilol  12.5 mg Oral BID WC  . cholecalciferol  4,000 Units Oral QHS  . clopidogrel  75 mg Oral Daily  . DULoxetine  60 mg Oral QHS  . ezetimibe  10 mg Oral Daily  . furosemide  60 mg Oral Daily  . insulin aspart  0-15 Units Subcutaneous TID WC  . insulin aspart  0-5 Units Subcutaneous QHS  . insulin aspart  24 Units Subcutaneous BID WC  . insulin glargine  64 Units Subcutaneous QHS  . isosorbide mononitrate  60 mg Oral QHS  . [START ON 12/04/2015] linaclotide  145 mcg Oral QODAY  . mirtazapine  15 mg Oral QHS  .  pantoprazole  40 mg Oral Daily  . potassium chloride SA  20 mEq Oral Daily  . QUEtiapine  400 mg Oral QHS  . sodium chloride flush  3 mL Intravenous Q12H  . sodium chloride flush  3 mL Intravenous Q12H   Continuous Infusions: . heparin 1,600 Units/hr (12/03/15 1102)   PRN Meds:.sodium chloride, acetaminophen **OR** acetaminophen, albuterol, HYDROcodone-acetaminophen, morphine injection, nitroGLYCERIN, ondansetron **OR** ondansetron (ZOFRAN) IV, sodium chloride flush, zolpidem   Antibiotics   Anti-infectives    None        Subjective:   Tyler Le was seen and examined today. Still having some shortness of breath with minimal exertion, no chest pain.  Patient denies dizziness, abdominal pain, N/V/D/C, new weakness, numbess, tingling. No acute events overnight.    Objective:   Filed Vitals:   12/03/15 0523 12/03/15 0759 12/03/15 0856 12/03/15 1102  BP: 115/72 123/82 107/65 97/62  Pulse: 111 104 107 110  Temp: 97.8 F (36.6 C) 98 F (36.7 C)     TempSrc:  Oral    Resp: 27 18 25 19   Height:      Weight:      SpO2: 98% 99% 94% 96%    Intake/Output Summary (Last 24 hours) at 12/03/15 1123 Last data filed at 12/03/15 0924  Gross per 24 hour  Intake 811.43 ml  Output   2050 ml  Net -1238.57 ml     Wt Readings from Last 3 Encounters:  12/02/15 127.551 kg (281 lb 3.2 oz)  11/14/15 127.1 kg (280 lb 3.3 oz)  10/11/15 131.906 kg (290 lb 12.8 oz)     Exam  General: Alert and oriented x 3, NAD  HEENT:  PERRLA, EOMI, Anicteric Sclera, mucous membranes moist.   Neck: Supple, no JVD, no masses  Cardiovascular: S1 S2 auscultated, no rubs, murmurs or gallops. Regular rate and rhythm.Surgical sternotomy scar well healing  Respiratory: Decreased breath sounds at the bases  Gastrointestinal: Obese Soft, nontender, nondistended, + bowel sounds  Ext: no cyanosis clubbing, 1+ edema  Neuro: AAOx3, Cr N's II- XII. Strength 5/5 upper and lower extremities bilaterally  Skin: No rashes  Psych: Normal affect and demeanor, alert and oriented x3    Data Reviewed:  I have personally reviewed following labs and imaging studies  Micro Results Recent Results (from the past 240 hour(s))  MRSA PCR Screening     Status: None   Collection Time: 12/02/15 11:24 PM  Result Value Ref Range Status   MRSA by PCR NEGATIVE NEGATIVE Final    Comment:        The GeneXpert MRSA Assay (FDA approved for NASAL specimens only), is one component of a comprehensive MRSA colonization surveillance program. It is not intended to diagnose MRSA infection nor to guide or monitor treatment for MRSA infections.     Radiology Reports Dg Chest 2 View  12/02/2015  CLINICAL DATA:  Severe shortness of breath when walking at home today. Recent CABG. EXAM: CHEST  2 VIEW COMPARISON:  11/08/2015. FINDINGS: Stable enlarged cardiac silhouette and post CABG changes. The tracheostomy tube has been removed. Small amount of linear density at both lung bases.  Stable small left pleural effusion. Thoracic spine degenerative changes. IMPRESSION: 1. Interval small amount of linear atelectasis or scarring at both lung bases. 2. Stable cardiomegaly. 3. Stable small left pleural effusion. Electronically Signed   By: Beckie Salts M.D.   On: 12/02/2015 19:01   Dg Chest 2 View  11/08/2015  CLINICAL DATA:  Post CABG, soreness and  weakness EXAM: CHEST  2 VIEW COMPARISON:  Portable chest x-ray of 11/07/2015 FINDINGS: Aeration of the lung bases has improved. There is still a small left pleural effusion present with mild left basilar atelectasis. Tracheostomy is unchanged in position. Right central venous line remains. Heart size is stable. IMPRESSION: Slightly better aeration. Persistent left basilar atelectasis and small left pleural effusion. Electronically Signed   By: Dwyane Dee M.D.   On: 11/08/2015 08:13   Ct Angio Chest Pe W/cm &/or Wo Cm  12/02/2015  CLINICAL DATA:  70 year old male with history of recent open heart surgery presenting with shortness of breath EXAM: CT ANGIOGRAPHY CHEST WITH CONTRAST TECHNIQUE: Multidetector CT imaging of the chest was performed using the standard protocol during bolus administration of intravenous contrast. Multiplanar CT image reconstructions and MIPs were obtained to evaluate the vascular anatomy. CONTRAST:  100 cc Isovue 370 COMPARISON:  Chest radiograph dated 12/02/2015 FINDINGS: The lungs are clear. There is a small left pleural effusion. No pneumothorax. The central airways are patent. There is mild moderate atherosclerotic calcification of the thoracic aorta. There is mild dilatation of the superior aspect of the descending thoracic aorta measuring up to 3.6 cm in diameter. The origins of the great vessels of the aortic arch appear patent. There is a large saddle embolus straddling the bifurcation of the main pulmonary trunk and extending to the central pulmonary arteries bilaterally. There is mild enlargement of the right  ventricle and right atrium with flattening of the interventricular septum compatible with a degree of right cardiac straining. Correlation with clinical exam and echocardiogram recommended. There is postsurgical changes of CABG with multiple vascular clips. There is a 2.5 x 2.7 cm loculated high attenuating fluid superior to the pericardium on the left likely a focal seroma or hematoma. There is no hilar or mediastinal adenopathy. The esophagus is grossly unremarkable. There is an ill-defined 2 cm right thyroid nodule with calcified focus. There is no axillary adenopathy. Median sternotomy wires and midline anterior chest wall surgical incision. No hematoma. There is degenerative change of of the spine. No acute fracture. There is retrograde flow of contrast from the right atrium into the IVC compatible with a degree of right cardiac dysfunction. Review of the MIP images confirms the above findings. IMPRESSION: Large saddle embolus with findings of right cardiac straining. Postsurgical changes of CABG. Small loculated fluid superior to the pericardium on the left may represent small hematoma or seroma. These results were called by telephone at the time of interpretation on 12/02/2015 at 9:23 pm to Dr. Glynn Octave , who verbally acknowledged these results. Electronically Signed   By: Elgie Collard M.D.   On: 12/02/2015 21:31   Dg Chest Port 1 View  11/07/2015  CLINICAL DATA:  Redo CABG EXAM: PORTABLE CHEST 1 VIEW FINDINGS: Tubular device is stable. Cardiomegaly stable. Left basilar opacity likely a combination of volume loss and pleural fluid is stable. Volume loss at the right base has improved. IMPRESSION: Improved volume loss at the right base. Stable left basilar opacity is described. Electronically Signed   By: Jolaine Click M.D.   On: 11/07/2015 07:51   Dg Chest Port 1 View  11/05/2015  CLINICAL DATA:  Post CABG EXAM: PORTABLE CHEST 1 VIEW COMPARISON:  11/04/2015 FINDINGS: Cardiomegaly again noted.  Status post CABG. Stable tracheostomy tube position. Central vascular congestion without pulmonary edema. Stable small left pleural effusion and left basilar atelectasis or infiltrate. There is no pneumothorax. Stable NG tube position. IMPRESSION: Status post CABG. No  pulmonary edema. Persistent small left pleural effusion left basilar atelectasis or infiltrate. Electronically Signed   By: Natasha Mead M.D.   On: 11/05/2015 10:04   Dg Chest Port 1 View  11/04/2015  CLINICAL DATA:  Ileus, status post CABG 10/20/2015 EXAM: PORTABLE CHEST 1 VIEW COMPARISON:  11/03/2015 FINDINGS: Cardiomegaly again noted. Status post CABG. Stable tracheostomy tube position. There is small left pleural effusion with left basilar atelectasis or infiltrate. No pulmonary edema. Stable right arm PICC line position with tip in SVC right atrium junction. NG tube in place. IMPRESSION: Stable tracheostomy tube position. There is small left pleural effusion with left basilar atelectasis or infiltrate. No pulmonary edema. Stable right arm PICC line position with tip in SVC right atrium junction. NG tube in place. Electronically Signed   By: Natasha Mead M.D.   On: 11/04/2015 11:31   Dg Abd Portable 1v  11/04/2015  CLINICAL DATA:  70 year old male with history of ileus. EXAM: PORTABLE ABDOMEN - 1 VIEW COMPARISON:  11/03/2015. FINDINGS: Tip of feeding tube appears to be in the distal second portion of the duodenum. Gas and stool are seen scattered throughout the colon extending to the level of the distal rectum. No pathologic distension of small bowel is noted. No gross evidence of pneumoperitoneum. Rectal thermister in place. Epicardial pacing wires projecting over the lower thorax and upper abdomen. IMPRESSION: 1. Support apparatus and postoperative changes, as above. 2. Nonobstructive bowel gas pattern. 3. No pneumoperitoneum. Electronically Signed   By: Trudie Reed M.D.   On: 11/04/2015 11:56   Dg Swallowing Func-speech  Pathology  11/07/2015  Objective Swallowing Evaluation: Type of Study: MBS-Modified Barium Swallow Study Patient Details Name: ELIUS ETHEREDGE MRN: [id number] Date of Birth: 08/07/1945 Today's Date: 11/07/2015 Time: SLP Start Time (ACUTE ONLY): 1013-SLP Stop Time (ACUTE ONLY): 1028 SLP Time Calculation (min) (ACUTE ONLY): 15 min Past Medical History: Past Medical History Diagnosis Date . Essential hypertension  . Dyslipidemia  . Coronary artery disease    a. 2001: s/p CABG X4;  b. 08/2010 s/p PCI/BMS to VG->RI->OM;  c. 06/2014 Cath/PCI: LM nl, LAD 126m, LCX 82m, RI 100, OM1 100, OM2 80, small, RCA 100ost, VG->RCA 100, VG->Diag 100, VG->RI->OM1 70-80 ISR (4.0x24 Promus DES), LIMA->LAD nl. . Morbid obesity (HCC)  . Dysrhythmia  . History of blood transfusion 2001   "related to OHS" . Anemia  . GERD (gastroesophageal reflux disease)  . Depression  . Anxiety  . Nephrolithiasis  . Complication of anesthesia    "they have a hard time waking him up" . Type II diabetes mellitus (HCC)  . Arthritis    "back, neck" (07/11/2014) . Chronic lower back pain  . Thrombocytopenia (HCC)    a. Borderline low platelets by labs noted in 06/2014 but also noted on prior labs as well. . Renal insufficiency    a. noted 06/2014 - baseline unclear as no data since 2012. Marland Kitchen RBBB  Past Surgical History: Past Surgical History Procedure Laterality Date . Cardiac catheterization  2001; ~ 2010 . Coronary artery bypass graft  2001   "CABG X 5" . Appendectomy  ~ 1955 . Lumbar laminectomy  01/2011 . Back surgery   . Cholecystectomy   . Shoulder arthroscopy w/ rotator cuff repair Left 1990's . Cardiovascular stress test  07/03/2009   EF 64% . Left heart catheterization with coronary/graft angiogram N/A 05/09/2011   Procedure: LEFT HEART CATHETERIZATION WITH Isabel Caprice;  Surgeon: Kathleene Hazel, MD;  Location: [facility] CATH LAB;  Service: Cardiovascular;  Laterality: N/A; . Tonsillectomy and adenoidectomy  1951 . Left heart catheterization with  coronary angiogram N/A 07/11/2014   Procedure: LEFT HEART CATHETERIZATION WITH CORONARY ANGIOGRAM;  Surgeon: Peter M Swaziland, MD;  Location: [facility] CATH LAB;  Service: Cardiovascular;  Laterality: N/A; . Coronary angioplasty with stent placement  March 2012   BMS to SVG to intermediate/OM . Coronary angioplasty with stent placement  07/11/2014   "1" . Cardiac catheterization N/A 10/16/2015   Procedure: Left Heart Cath and Cors/Grafts Angiography;  Surgeon: Corky Crafts, MD;  Location: [facility] INVASIVE CV LAB;  Service: Cardiovascular;  Laterality: N/A; . Coronary artery bypass graft N/A 10/20/2015   Procedure: REDO CORONARY ARTERY BYPASS GRAFTING (CABG), TIMES THREE, USING LEFT GREATER SAPHENOUS VEIN HARVESTED ENDOSCOPICALLY AND CRYO VEIN, WITH CORONARY ENDARTERECTOMY;  Surgeon: Kerin Perna, MD;  Location: [facility] OR;  Service: Open Heart Surgery;  Laterality: N/A;  SVG to LAD, SVG to OM, CRYOVEIN to RCA with Endarterectomy . Tee without cardioversion N/A 10/20/2015   Procedure: TRANSESOPHAGEAL ECHOCARDIOGRAM (TEE);  Surgeon: Kerin Perna, MD;  [facility] OR;  Service: Open Heart Surgery;  Laterality: N/A; . Endarterectomy Right 10/20/2015   Procedure: ENDARTERECTOMY CAROTID;  Surgeon: Pryor Ochoa, MD;  Location: [facility] OR;  Service: Vascular;  Laterality: Right; HPI: pt presents with Cardiac Arrest and Redo of CABG x3. pt intubated 4/24 and with Trach on 5/5. pt with hx of CABG, Anxiety, HTN, DM, CAD, Renal Insufficiency, and Back Surgeries.  Subjective: "I'm tired" Assessment / Plan / Recommendation CHL IP CLINICAL IMPRESSIONS 11/07/2015 Therapy Diagnosis Mild pharyngeal phase dysphagia Clinical Impression Pt has a mild pharyngeal dysphagia due to delay in swallow trigger and reduced movement of his hyolaryngeal musculature, leading to incomplete airway protection during the swallow and moderate vallecular residue. Silent penetration occurs with thin liquids, requiring cues for multiple throat clears and coughs to  clear. Nectar thick liquids reach the pyriform sinuses prior to swallwo triggger, also putting them at risk for airway invasion, although they are better contained during single cup sips. He appears to have good sensation of residue in his valleculae, as he spontaneously brings the residue back into his oral cavity and reswallows until it is better cleared. Recommend Dys 2 diet to reduce the amount of pharyngeal residue and nectar thick liquids by cup. Pt should have PMV in place during all PO intake. Will continue to follow and progress as able. Impact on safety and function Mild aspiration risk   CHL IP TREATMENT RECOMMENDATION 11/07/2015 Treatment Recommendations Therapy as outlined in treatment plan below   Prognosis 11/07/2015 Prognosis for Safe Diet Advancement Good Barriers to Reach Goals -- Barriers/Prognosis Comment -- CHL IP DIET RECOMMENDATION 11/07/2015 SLP Diet Recommendations Dysphagia 2 (Fine chop) solids;Nectar thick liquid Liquid Administration via Cup;No straw Medication Administration Crushed with puree Compensations Slow rate;Small sips/bites;Multiple dry swallows after each bite/sip Postural Changes Seated upright at 90 degrees   CHL IP OTHER RECOMMENDATIONS 11/07/2015 Recommended Consults -- Oral Care Recommendations Oral care BID Other Recommendations Order thickener from pharmacy;Prohibited food (jello, ice cream, thin soups);Remove water pitcher   CHL IP FOLLOW UP RECOMMENDATIONS 11/07/2015 Follow up Recommendations Inpatient Rehab   CHL IP FREQUENCY AND DURATION 11/07/2015 Speech Therapy Frequency (ACUTE ONLY) min 2x/week Treatment Duration 2 weeks      CHL IP ORAL PHASE 11/07/2015 Oral Phase WFL Oral - Pudding Teaspoon -- Oral - Pudding Cup -- Oral - Honey Teaspoon -- Oral - Honey Cup -- Oral - Nectar Teaspoon -- Oral - Nectar  Cup -- Oral - Nectar Straw -- Oral - Thin Teaspoon -- Oral - Thin Cup -- Oral - Thin Straw -- Oral - Puree -- Oral - Mech Soft -- Oral - Regular -- Oral - Multi-Consistency  -- Oral - Pill -- Oral Phase - Comment --  CHL IP PHARYNGEAL PHASE 11/07/2015 Pharyngeal Phase Impaired Pharyngeal- Pudding Teaspoon -- Pharyngeal -- Pharyngeal- Pudding Cup -- Pharyngeal -- Pharyngeal- Honey Teaspoon -- Pharyngeal -- Pharyngeal- Honey Cup -- Pharyngeal -- Pharyngeal- Nectar Teaspoon -- Pharyngeal -- Pharyngeal- Nectar Cup Delayed swallow initiation-vallecula;Reduced epiglottic inversion;Reduced anterior laryngeal mobility;Reduced laryngeal elevation;Reduced airway/laryngeal closure;Reduced tongue base retraction;Pharyngeal residue - valleculae Pharyngeal -- Pharyngeal- Nectar Straw Reduced epiglottic inversion;Reduced anterior laryngeal mobility;Reduced laryngeal elevation;Reduced airway/laryngeal closure;Reduced tongue base retraction;Pharyngeal residue - valleculae;Delayed swallow initiation-pyriform sinuses Pharyngeal -- Pharyngeal- Thin Teaspoon -- Pharyngeal -- Pharyngeal- Thin Cup Reduced epiglottic inversion;Reduced anterior laryngeal mobility;Reduced laryngeal elevation;Reduced airway/laryngeal closure;Reduced tongue base retraction;Pharyngeal residue - valleculae;Delayed swallow initiation-pyriform sinuses;Penetration/Aspiration before swallow Pharyngeal Material enters airway, remains ABOVE vocal cords and not ejected out Pharyngeal- Thin Straw -- Pharyngeal -- Pharyngeal- Puree Delayed swallow initiation-vallecula;Reduced epiglottic inversion;Reduced anterior laryngeal mobility;Reduced laryngeal elevation;Reduced airway/laryngeal closure;Reduced tongue base retraction;Pharyngeal residue - valleculae Pharyngeal -- Pharyngeal- Mechanical Soft Delayed swallow initiation-vallecula;Reduced epiglottic inversion;Reduced anterior laryngeal mobility;Reduced laryngeal elevation;Reduced airway/laryngeal closure;Reduced tongue base retraction;Pharyngeal residue - valleculae Pharyngeal -- Pharyngeal- Regular -- Pharyngeal -- Pharyngeal- Multi-consistency -- Pharyngeal -- Pharyngeal- Pill -- Pharyngeal  -- Pharyngeal Comment --  CHL IP CERVICAL ESOPHAGEAL PHASE 11/07/2015 Cervical Esophageal Phase WFL Pudding Teaspoon -- Pudding Cup -- Honey Teaspoon -- Honey Cup -- Nectar Teaspoon -- Nectar Cup -- Nectar Straw -- Thin Teaspoon -- Thin Cup -- Thin Straw -- Puree -- Mechanical Soft -- Regular -- Multi-consistency -- Pill -- Cervical Esophageal Comment -- No flowsheet data found. Maxcine HamLaura Paiewonsky, M.A. CCC-SLP [phone] Maxcine Hamaiewonsky, Laura 11/07/2015, 1:20 PM               Lab Data:  CBC:  Recent Labs Lab 12/02/15 1719 12/02/15 2349 12/03/15 0413  WBC 6.2 6.4 5.3  HGB 11.1* 11.4* 10.3*  HCT 37.0* 37.4* 34.1*  MCV 92.3 91.7 90.9  PLT 162 183 167   Basic Metabolic Panel:  Recent Labs Lab 12/02/15 1719 12/03/15 0413  NA 138 139  K 4.5 3.8  CL 105 102  CO2 26 28  GLUCOSE 178* 158*  BUN 9 11  CREATININE 1.21 1.34*  CALCIUM 9.0 9.0   GFR: Estimated Creatinine Clearance: 71.7 mL/min (by C-G formula based on Cr of 1.34). Liver Function Tests: No results for input(s): AST, ALT, ALKPHOS, BILITOT, PROT, ALBUMIN in the last 168 hours. No results for input(s): LIPASE, AMYLASE in the last 168 hours. No results for input(s): AMMONIA in the last 168 hours. Coagulation Profile:  Recent Labs Lab 12/02/15 1719  INR 1.09   Cardiac Enzymes:  Recent Labs Lab 12/02/15 1719 12/02/15 2349 12/03/15 0413  TROPONINI 0.04* 0.06* 0.06*   BNP (last 3 results) No results for input(s): PROBNP in the last 8760 hours. HbA1C: No results for input(s): HGBA1C in the last 72 hours. CBG:  Recent Labs Lab 12/02/15 2327 12/03/15 0723  GLUCAP 172* 131*   Lipid Profile: No results for input(s): CHOL, HDL, LDLCALC, TRIG, CHOLHDL, LDLDIRECT in the last 72 hours. Thyroid Function Tests: No results for input(s): TSH, T4TOTAL, FREET4, T3FREE, THYROIDAB in the last 72 hours. Anemia Panel: No results for input(s): VITAMINB12, FOLATE, FERRITIN, TIBC, IRON, RETICCTPCT in the last 72 hours. Urine  analysis:    Component  Value Date/Time   COLORURINE AMBER* 10/29/2015 1310   APPEARANCEUR CLEAR 10/29/2015 1310   LABSPEC 1.025 10/29/2015 1310   PHURINE 6.0 10/29/2015 1310   GLUCOSEU NEGATIVE 10/29/2015 1310   HGBUR TRACE* 10/29/2015 1310   BILIRUBINUR SMALL* 10/29/2015 1310   KETONESUR NEGATIVE 10/29/2015 1310   PROTEINUR 30* 10/29/2015 1310   UROBILINOGEN 1.0 08/01/2014 1417   NITRITE NEGATIVE 10/29/2015 1310   LEUKOCYTESUR NEGATIVE 10/29/2015 1310     RAI,RIPUDEEP M.D. [facility] 12/03/2015, 11:23 AM  Pager: [phone] Between 7am to 7pm - call Pager - [phone]  After 7pm go to [email or web address] - password [email or web address]  Call night coverage person covering after 7pm

## 2015-12-04 ENCOUNTER — Inpatient Hospital Stay (HOSPITAL_COMMUNITY): Payer: Medicare Other

## 2015-12-04 DIAGNOSIS — R7989 Other specified abnormal findings of blood chemistry: Secondary | ICD-10-CM

## 2015-12-04 DIAGNOSIS — J9600 Acute respiratory failure, unspecified whether with hypoxia or hypercapnia: Secondary | ICD-10-CM

## 2015-12-04 DIAGNOSIS — I82403 Acute embolism and thrombosis of unspecified deep veins of lower extremity, bilateral: Secondary | ICD-10-CM

## 2015-12-04 DIAGNOSIS — R0902 Hypoxemia: Secondary | ICD-10-CM

## 2015-12-04 DIAGNOSIS — I272 Other secondary pulmonary hypertension: Secondary | ICD-10-CM

## 2015-12-04 DIAGNOSIS — I2692 Saddle embolus of pulmonary artery without acute cor pulmonale: Principal | ICD-10-CM

## 2015-12-04 DIAGNOSIS — R778 Other specified abnormalities of plasma proteins: Secondary | ICD-10-CM | POA: Insufficient documentation

## 2015-12-04 LAB — ECHOCARDIOGRAM COMPLETE
AV Area VTI: 1.04 cm2
AV Peak grad: 27 mmHg
AV peak Index: 0.39
AV pk vel: 261 cm/s
Ao pk vel: 0.3 m/s
CHL CUP DOP CALC LVOT VTI: 16.2 cm
CHL CUP MV DEC (S): 232
CHL CUP STROKE VOLUME: 44 mL
CHL CUP TV REG PEAK VELOCITY: 305 cm/s
E decel time: 232 msec
E/e' ratio: 10.43
FS: 45 % — AB (ref 28–44)
HEIGHTINCHES: 75 in
IVS/LV PW RATIO, ED: 1.13
LA ID, A-P, ES: 32 mm
LA diam index: 1.21 cm/m2
LA vol index: 13.7 mL/m2
LA vol: 36.2 mL
LAVOLA4C: 30.6 mL
LDCA: 3.46 cm2
LEFT ATRIUM END SYS DIAM: 32 mm
LV E/e' medial: 10.43
LV SIMPSON'S DISK: 69
LV TDI E'LATERAL: 7.62
LV TDI E'MEDIAL: 3.59
LV dias vol: 64 mL (ref 62–150)
LV e' LATERAL: 7.62 cm/s
LV sys vol index: 7 mL/m2
LVDIAVOLIN: 24 mL/m2
LVEEAVG: 10.43
LVOT peak vel: 78.3 cm/s
LVOTD: 21 mm
LVOTSV: 56 mL
LVSYSVOL: 20 mL — AB (ref 21–61)
MV Peak grad: 3 mmHg
MVPKAVEL: 68.9 m/s
MVPKEVEL: 79.5 m/s
PW: 14.4 mm — AB (ref 0.6–1.1)
TAPSE: 12.4 mm
TRMAXVEL: 305 cm/s
WEIGHTICAEL: 4499.2 [oz_av]

## 2015-12-04 LAB — BASIC METABOLIC PANEL
ANION GAP: 10 (ref 5–15)
BUN: 17 mg/dL (ref 6–20)
CHLORIDE: 103 mmol/L (ref 101–111)
CO2: 26 mmol/L (ref 22–32)
Calcium: 9.1 mg/dL (ref 8.9–10.3)
Creatinine, Ser: 1.49 mg/dL — ABNORMAL HIGH (ref 0.61–1.24)
GFR calc non Af Amer: 45 mL/min — ABNORMAL LOW (ref >60)
GFR, EST AFRICAN AMERICAN: 52 mL/min — AB (ref >60)
Glucose, Bld: 223 mg/dL — ABNORMAL HIGH (ref 65–99)
Potassium: 4.3 mmol/L (ref 3.5–5.1)
Sodium: 139 mmol/L (ref 135–145)

## 2015-12-04 LAB — CBC
HCT: 33 % — ABNORMAL LOW (ref 39.0–52.0)
HEMOGLOBIN: 10 g/dL — AB (ref 13.0–17.0)
MCH: 28.1 pg (ref 26.0–34.0)
MCHC: 30.3 g/dL (ref 30.0–36.0)
MCV: 92.7 fL (ref 78.0–100.0)
Platelets: 158 10*3/uL (ref 150–400)
RBC: 3.56 MIL/uL — AB (ref 4.22–5.81)
RDW: 15.9 % — ABNORMAL HIGH (ref 11.5–15.5)
WBC: 5.1 10*3/uL (ref 4.0–10.5)

## 2015-12-04 LAB — GLUCOSE, CAPILLARY
GLUCOSE-CAPILLARY: 147 mg/dL — AB (ref 65–99)
Glucose-Capillary: 183 mg/dL — ABNORMAL HIGH (ref 65–99)
Glucose-Capillary: 165 mg/dL — ABNORMAL HIGH (ref 65–99)
Glucose-Capillary: 133 mg/dL — ABNORMAL HIGH (ref 65–99)

## 2015-12-04 LAB — HEPARIN LEVEL (UNFRACTIONATED): HEPARIN UNFRACTIONATED: 0.35 [IU]/mL (ref 0.30–0.70)

## 2015-12-04 MED ORDER — WITCH HAZEL-GLYCERIN EX PADS
MEDICATED_PAD | Freq: Three times a day (TID) | CUTANEOUS | Status: DC
  Filled 2015-12-04: qty 100

## 2015-12-04 MED ORDER — PERFLUTREN LIPID MICROSPHERE
INTRAVENOUS | Status: AC
  Filled 2015-12-04: qty 10

## 2015-12-04 MED ORDER — WITCH HAZEL-GLYCERIN EX PADS: MEDICATED_PAD | CUTANEOUS | Status: DC | PRN

## 2015-12-04 MED ORDER — PERFLUTREN LIPID MICROSPHERE
1.0000 mL | INTRAVENOUS | Status: AC | PRN
  Administered 2015-12-04: 2 mL via INTRAVENOUS
  Filled 2015-12-04: qty 10

## 2015-12-04 MED ORDER — HYDROCORTISONE ACETATE 25 MG RE SUPP
25.0000 mg | Freq: Two times a day (BID) | RECTAL | Status: DC
  Filled 2015-12-04: qty 1

## 2015-12-04 NOTE — Progress Notes (Signed)
[facility]                                                                              Patient Demographics  Tyler Le, is a 70 y.o. male, DOB - [birth year], ZOX:[id number]  Admit date - 12/02/2015   Admitting Physician Carron Curie, MD  Outpatient Primary MD for the patient is Kaleen Mask, MD  Outpatient specialists:   LOS - 2  days    Chief Complaint  Patient presents with  . Shortness of Breath       Brief summary  Patient is a 70 year old male with CAD status post CABG in 4/ 2017, peripheral vascular disease status post endarterectomy 4/17 who was recently discharged home after a prolonged hospitalization in April presented with increased dyspnea on exertion and shortness of breath for the last 2 days. He also noticed swelling in the lower extremities in the last few days. CT angiogram of the chest showed large saddle pulmonary embolism. Patient was admitted for further workup.    Assessment & Plan    Principal Problem:   Acute hypoxic respiratory failure with saddle Pulmonary embolism (HCC) - Continue IV heparin drip - 2-D echo, Doppler ultrasound of the lower extremities - Pulmonology following, high risk for major bleeding with TPA given recent surgeries - Discussed in detail with the patient and wife at the bedside, prefers NOAC over warfarin/lovenox or heparin. Will continue IV heparin for now. Will transition to NOAC in a.m. if possible with renal function. Case management consult to check insurance benefits. - wean O2 as tolerated, will need home O2 - 2-D echo showed EF of 55-60% with grade 2 diastolic dysfunction, right ventricle systolic function mildly reduced, PA pressure moderately increased  Right leg DVT -Doppler ultrasound of the lower extremities positive for Acute nonocclusive DVT in the right common femoral vein and SVT at saphenofemoral junction    S/P CABG (coronary artery bypass graft), CAD - Currently stable,  continue amlodipine, aspirin, Coreg, Plavix, Zetia, Lasix - Mild elevation in the troponins likely due to large saddle emboli - 2-D echo shows preserved EF    Type II diabetes mellitus (HCC) - Continue sliding scale and Lantus    Essential hypertension - Currently stable, continue Coreg, Lasix  CKD stage III - Follow creatinine function closely, Cr trending up   Code Status: Full CODE STATUS DVT Prophylaxis:  Heparin drip Family Communication: Discussed in detail with the patient, all imaging results, lab results explained to the patient and wife   Disposition Plan:   Time Spent in minutes   *25 minutes  Procedures:  CT angiogram of the chest 2-D echo Doppler ultrasound lower extremities  Consultants:   Pulmonology CTVS  Antimicrobials:      Medications  Scheduled Meds: . amLODipine  5 mg Oral QHS  . busPIRone  15 mg Oral Daily  . carvedilol  12.5 mg Oral BID WC  . cholecalciferol  4,000 Units Oral QHS  . clopidogrel  75 mg Oral Daily  . DULoxetine  60 mg Oral QHS  . ezetimibe  10 mg Oral Daily  . furosemide  60 mg Oral Daily  .  hydrocortisone  25 mg Rectal BID  . insulin aspart  0-15 Units Subcutaneous TID WC  . insulin aspart  0-5 Units Subcutaneous QHS  . insulin aspart  24 Units Subcutaneous BID WC  . insulin glargine  64 Units Subcutaneous QHS  . isosorbide mononitrate  60 mg Oral QHS  . linaclotide  145 mcg Oral QODAY  . mirtazapine  15 mg Oral QHS  . pantoprazole  40 mg Oral Daily  . potassium chloride SA  20 mEq Oral Daily  . QUEtiapine  400 mg Oral QHS  . sodium chloride flush  3 mL Intravenous Q12H  . sodium chloride flush  3 mL Intravenous Q12H  . witch hazel-glycerin   Topical TID   Continuous Infusions: . heparin 1,700 Units/hr (12/04/15 1103)   PRN Meds:.sodium chloride, acetaminophen **OR** acetaminophen, albuterol, HYDROcodone-acetaminophen, morphine injection, nitroGLYCERIN, ondansetron **OR** ondansetron (ZOFRAN) IV, sodium chloride  flush, zolpidem   Antibiotics   Anti-infectives    None        Subjective:   Tyler Le was seen and examined today. Still on 5 L O2 via nasal cannula, shortness of breath. Peripheral edema is improving. No chest pain.  Patient denies dizziness, abdominal pain, N/V/D/C, new weakness, numbess, tingling. No acute events overnight.    Objective:   Filed Vitals:   12/03/15 1620 12/03/15 2000 12/04/15 0500 12/04/15 1315  BP: 129/83 109/57 153/81 139/80  Pulse: 105 100 90 101  Temp:  98.6 F (37 C) 98.4 F (36.9 C) 98 F (36.7 C)  TempSrc:    Oral  Resp: 18 22 25 20   Height:      Weight:   127.551 kg (281 lb 3.2 oz)   SpO2: 94% 100% 100% 95%    Intake/Output Summary (Last 24 hours) at 12/04/15 1407 Last data filed at 12/04/15 1316  Gross per 24 hour  Intake 1298.69 ml  Output   1551 ml  Net -252.31 ml     Wt Readings from Last 3 Encounters:  12/04/15 127.551 kg (281 lb 3.2 oz)  11/14/15 127.1 kg (280 lb 3.3 oz)  10/11/15 131.906 kg (290 lb 12.8 oz)     Exam  General: Alert and oriented x 3, NAD  HEENT:    Neck:   Cardiovascular: S1 S2clear, RRR. Surgical sternotomy scar well healing  Respiratory: Decreased breath sounds at the bases  Gastrointestinal: Obese Soft, nontender, nondistended, + bowel sounds  Ext: no cyanosis clubbing, 1+ edema  Neuro: no new deficits  Skin: No rashes  Psych: Normal affect and demeanor, alert and oriented x3    Data Reviewed:  I have personally reviewed following labs and imaging studies  Micro Results Recent Results (from the past 240 hour(s))  MRSA PCR Screening     Status: None   Collection Time: 12/02/15 11:24 PM  Result Value Ref Range Status   MRSA by PCR NEGATIVE NEGATIVE Final    Comment:        The GeneXpert MRSA Assay (FDA approved for NASAL specimens only), is one component of a comprehensive MRSA colonization surveillance program. It is not intended to diagnose MRSA infection nor to guide  or monitor treatment for MRSA infections.     Radiology Reports Dg Chest 2 View  12/02/2015  CLINICAL DATA:  Severe shortness of breath when walking at home today. Recent CABG. EXAM: CHEST  2 VIEW COMPARISON:  11/08/2015. FINDINGS: Stable enlarged cardiac silhouette and post CABG changes. The tracheostomy tube has been removed. Small amount of linear density at  both lung bases. Stable small left pleural effusion. Thoracic spine degenerative changes. IMPRESSION: 1. Interval small amount of linear atelectasis or scarring at both lung bases. 2. Stable cardiomegaly. 3. Stable small left pleural effusion. Electronically Signed   By: Beckie Salts M.D.   On: 12/02/2015 19:01   Dg Chest 2 View  11/08/2015  CLINICAL DATA:  Post CABG, soreness and weakness EXAM: CHEST  2 VIEW COMPARISON:  Portable chest x-ray of 11/07/2015 FINDINGS: Aeration of the lung bases has improved. There is still a small left pleural effusion present with mild left basilar atelectasis. Tracheostomy is unchanged in position. Right central venous line remains. Heart size is stable. IMPRESSION: Slightly better aeration. Persistent left basilar atelectasis and small left pleural effusion. Electronically Signed   By: Dwyane Dee M.D.   On: 11/08/2015 08:13   Ct Angio Chest Pe W/cm &/or Wo Cm  12/02/2015  CLINICAL DATA:  70 year old male with history of recent open heart surgery presenting with shortness of breath EXAM: CT ANGIOGRAPHY CHEST WITH CONTRAST TECHNIQUE: Multidetector CT imaging of the chest was performed using the standard protocol during bolus administration of intravenous contrast. Multiplanar CT image reconstructions and MIPs were obtained to evaluate the vascular anatomy. CONTRAST:  100 cc Isovue 370 COMPARISON:  Chest radiograph dated 12/02/2015 FINDINGS: The lungs are clear. There is a small left pleural effusion. No pneumothorax. The central airways are patent. There is mild moderate atherosclerotic calcification of the  thoracic aorta. There is mild dilatation of the superior aspect of the descending thoracic aorta measuring up to 3.6 cm in diameter. The origins of the great vessels of the aortic arch appear patent. There is a large saddle embolus straddling the bifurcation of the main pulmonary trunk and extending to the central pulmonary arteries bilaterally. There is mild enlargement of the right ventricle and right atrium with flattening of the interventricular septum compatible with a degree of right cardiac straining. Correlation with clinical exam and echocardiogram recommended. There is postsurgical changes of CABG with multiple vascular clips. There is a 2.5 x 2.7 cm loculated high attenuating fluid superior to the pericardium on the left likely a focal seroma or hematoma. There is no hilar or mediastinal adenopathy. The esophagus is grossly unremarkable. There is an ill-defined 2 cm right thyroid nodule with calcified focus. There is no axillary adenopathy. Median sternotomy wires and midline anterior chest wall surgical incision. No hematoma. There is degenerative change of of the spine. No acute fracture. There is retrograde flow of contrast from the right atrium into the IVC compatible with a degree of right cardiac dysfunction. Review of the MIP images confirms the above findings. IMPRESSION: Large saddle embolus with findings of right cardiac straining. Postsurgical changes of CABG. Small loculated fluid superior to the pericardium on the left may represent small hematoma or seroma. These results were called by telephone at the time of interpretation on 12/02/2015 at 9:23 pm to Dr. Glynn Octave , who verbally acknowledged these results. Electronically Signed   By: Elgie Collard M.D.   On: 12/02/2015 21:31   Dg Chest Port 1 View  11/07/2015  CLINICAL DATA:  Redo CABG EXAM: PORTABLE CHEST 1 VIEW FINDINGS: Tubular device is stable. Cardiomegaly stable. Left basilar opacity likely a combination of volume loss and  pleural fluid is stable. Volume loss at the right base has improved. IMPRESSION: Improved volume loss at the right base. Stable left basilar opacity is described. Electronically Signed   By: Jolaine Click M.D.   On: 11/07/2015  07:51   Dg Chest Port 1 View  11/05/2015  CLINICAL DATA:  Post CABG EXAM: PORTABLE CHEST 1 VIEW COMPARISON:  11/04/2015 FINDINGS: Cardiomegaly again noted. Status post CABG. Stable tracheostomy tube position. Central vascular congestion without pulmonary edema. Stable small left pleural effusion and left basilar atelectasis or infiltrate. There is no pneumothorax. Stable NG tube position. IMPRESSION: Status post CABG. No pulmonary edema. Persistent small left pleural effusion left basilar atelectasis or infiltrate. Electronically Signed   By: Natasha Mead M.D.   On: 11/05/2015 10:04   Dg Swallowing Func-speech Pathology  11/07/2015  Objective Swallowing Evaluation: Type of Study: MBS-Modified Barium Swallow Study Patient Details Name: GWENDOLYN NISHI MRN: [id number] Date of Birth: Jan 07, 1946 Today's Date: 11/07/2015 Time: SLP Start Time (ACUTE ONLY): 1013-SLP Stop Time (ACUTE ONLY): 1028 SLP Time Calculation (min) (ACUTE ONLY): 15 min Past Medical History: Past Medical History Diagnosis Date . Essential hypertension  . Dyslipidemia  . Coronary artery disease    a. 2001: s/p CABG X4;  b. 08/2010 s/p PCI/BMS to VG->RI->OM;  c. 06/2014 Cath/PCI: LM nl, LAD 14m, LCX 80m, RI 100, OM1 100, OM2 80, small, RCA 100ost, VG->RCA 100, VG->Diag 100, VG->RI->OM1 70-80 ISR (4.0x24 Promus DES), LIMA->LAD nl. . Morbid obesity (HCC)  . Dysrhythmia  . History of blood transfusion 2001   "related to OHS" . Anemia  . GERD (gastroesophageal reflux disease)  . Depression  . Anxiety  . Nephrolithiasis  . Complication of anesthesia    "they have a hard time waking him up" . Type II diabetes mellitus (HCC)  . Arthritis    "back, neck" (07/11/2014) . Chronic lower back pain  . Thrombocytopenia (HCC)    a. Borderline low  platelets by labs noted in 06/2014 but also noted on prior labs as well. . Renal insufficiency    a. noted 06/2014 - baseline unclear as no data since 2012. Marland Kitchen RBBB  Past Surgical History: Past Surgical History Procedure Laterality Date . Cardiac catheterization  2001; ~ 2010 . Coronary artery bypass graft  2001   "CABG X 5" . Appendectomy  ~ 1955 . Lumbar laminectomy  01/2011 . Back surgery   . Cholecystectomy   . Shoulder arthroscopy w/ rotator cuff repair Left 1990's . Cardiovascular stress test  07/03/2009   EF 64% . Left heart catheterization with coronary/graft angiogram N/A 05/09/2011   Procedure: LEFT HEART CATHETERIZATION WITH Isabel Caprice;  Surgeon: Kathleene Hazel, MD;  Location[facility] CATH LAB;  Service: Cardiovascular;  Laterality: N/A; . Tonsillectomy and adenoidectomy  1951 . Left heart catheterization with coronary angiogram N/A 07/11/2014   Procedure: LEFT HEART CATHETERIZATION WITH CORONARY ANGIOGRAM;  Surgeon: Peter M Swaziland, MD;  Location: [facility] CATH LAB;  Service: Cardiovascular;  Laterality: N/A; . Coronary angioplasty with stent placement  March 2012   BMS to SVG to intermediate/OM . Coronary angioplasty with stent placement  07/11/2014   "1" . Cardiac catheterization N/A 10/16/2015   Procedure: Left Heart Cath and Cors/Grafts Angiography;  Surgeon: Corky Crafts, MD;  Location: [facility] INVASIVE CV LAB;  Service: Cardiovascular;  Laterality: N/A; . Coronary artery bypass graft N/A 10/20/2015   Procedure: REDO CORONARY ARTERY BYPASS GRAFTING (CABG), TIMES THREE, USING LEFT GREATER SAPHENOUS VEIN HARVESTED ENDOSCOPICALLY AND CRYO VEIN, WITH CORONARY ENDARTERECTOMY;  Surgeon: Kerin Perna, MD;  Location: [facility] OR;  Service: Open Heart Surgery;  Laterality: N/A;  SVG to LAD, SVG to OM, CRYOVEIN to RCA with Endarterectomy . Tee without cardioversion N/A 10/20/2015   Procedure: TRANSESOPHAGEAL ECHOCARDIOGRAM (  TEE);  Surgeon: Kerin Perna, MD;  Location: [facility] OR;  Service: Open Heart Surgery;   Laterality: N/A; . Endarterectomy Right 10/20/2015   Procedure: ENDARTERECTOMY CAROTID;  Surgeon: Pryor Ochoa, MD;  Location: [facility] OR;  Service: Vascular;  Laterality: Right; HPI: pt presents with Cardiac Arrest and Redo of CABG x3. pt intubated 4/24 and with Trach on 5/5. pt with hx of CABG, Anxiety, HTN, DM, CAD, Renal Insufficiency, and Back Surgeries.  Subjective: "I'm tired" Assessment / Plan / Recommendation CHL IP CLINICAL IMPRESSIONS 11/07/2015 Therapy Diagnosis Mild pharyngeal phase dysphagia Clinical Impression Pt has a mild pharyngeal dysphagia due to delay in swallow trigger and reduced movement of his hyolaryngeal musculature, leading to incomplete airway protection during the swallow and moderate vallecular residue. Silent penetration occurs with thin liquids, requiring cues for multiple throat clears and coughs to clear. Nectar thick liquids reach the pyriform sinuses prior to swallwo triggger, also putting them at risk for airway invasion, although they are better contained during single cup sips. He appears to have good sensation of residue in his valleculae, as he spontaneously brings the residue back into his oral cavity and reswallows until it is better cleared. Recommend Dys 2 diet to reduce the amount of pharyngeal residue and nectar thick liquids by cup. Pt should have PMV in place during all PO intake. Will continue to follow and progress as able. Impact on safety and function Mild aspiration risk   CHL IP TREATMENT RECOMMENDATION 11/07/2015 Treatment Recommendations Therapy as outlined in treatment plan below   Prognosis 11/07/2015 Prognosis for Safe Diet Advancement Good Barriers to Reach Goals -- Barriers/Prognosis Comment -- CHL IP DIET RECOMMENDATION 11/07/2015 SLP Diet Recommendations Dysphagia 2 (Fine chop) solids;Nectar thick liquid Liquid Administration via Cup;No straw Medication Administration Crushed with puree Compensations Slow rate;Small sips/bites;Multiple dry swallows after  each bite/sip Postural Changes Seated upright at 90 degrees   CHL IP OTHER RECOMMENDATIONS 11/07/2015 Recommended Consults -- Oral Care Recommendations Oral care BID Other Recommendations Order thickener from pharmacy;Prohibited food (jello, ice cream, thin soups);Remove water pitcher   CHL IP FOLLOW UP RECOMMENDATIONS 11/07/2015 Follow up Recommendations Inpatient Rehab   CHL IP FREQUENCY AND DURATION 11/07/2015 Speech Therapy Frequency (ACUTE ONLY) min 2x/week Treatment Duration 2 weeks      CHL IP ORAL PHASE 11/07/2015 Oral Phase WFL Oral - Pudding Teaspoon -- Oral - Pudding Cup -- Oral - Honey Teaspoon -- Oral - Honey Cup -- Oral - Nectar Teaspoon -- Oral - Nectar Cup -- Oral - Nectar Straw -- Oral - Thin Teaspoon -- Oral - Thin Cup -- Oral - Thin Straw -- Oral - Puree -- Oral - Mech Soft -- Oral - Regular -- Oral - Multi-Consistency -- Oral - Pill -- Oral Phase - Comment --  CHL IP PHARYNGEAL PHASE 11/07/2015 Pharyngeal Phase Impaired Pharyngeal- Pudding Teaspoon -- Pharyngeal -- Pharyngeal- Pudding Cup -- Pharyngeal -- Pharyngeal- Honey Teaspoon -- Pharyngeal -- Pharyngeal- Honey Cup -- Pharyngeal -- Pharyngeal- Nectar Teaspoon -- Pharyngeal -- Pharyngeal- Nectar Cup Delayed swallow initiation-vallecula;Reduced epiglottic inversion;Reduced anterior laryngeal mobility;Reduced laryngeal elevation;Reduced airway/laryngeal closure;Reduced tongue base retraction;Pharyngeal residue - valleculae Pharyngeal -- Pharyngeal- Nectar Straw Reduced epiglottic inversion;Reduced anterior laryngeal mobility;Reduced laryngeal elevation;Reduced airway/laryngeal closure;Reduced tongue base retraction;Pharyngeal residue - valleculae;Delayed swallow initiation-pyriform sinuses Pharyngeal -- Pharyngeal- Thin Teaspoon -- Pharyngeal -- Pharyngeal- Thin Cup Reduced epiglottic inversion;Reduced anterior laryngeal mobility;Reduced laryngeal elevation;Reduced airway/laryngeal closure;Reduced tongue base retraction;Pharyngeal residue -  valleculae;Delayed swallow initiation-pyriform sinuses;Penetration/Aspiration before swallow Pharyngeal Material enters airway, remains ABOVE vocal cords and  not ejected out Pharyngeal- Thin Straw -- Pharyngeal -- Pharyngeal- Puree Delayed swallow initiation-vallecula;Reduced epiglottic inversion;Reduced anterior laryngeal mobility;Reduced laryngeal elevation;Reduced airway/laryngeal closure;Reduced tongue base retraction;Pharyngeal residue - valleculae Pharyngeal -- Pharyngeal- Mechanical Soft Delayed swallow initiation-vallecula;Reduced epiglottic inversion;Reduced anterior laryngeal mobility;Reduced laryngeal elevation;Reduced airway/laryngeal closure;Reduced tongue base retraction;Pharyngeal residue - valleculae Pharyngeal -- Pharyngeal- Regular -- Pharyngeal -- Pharyngeal- Multi-consistency -- Pharyngeal -- Pharyngeal- Pill -- Pharyngeal -- Pharyngeal Comment --  CHL IP CERVICAL ESOPHAGEAL PHASE 11/07/2015 Cervical Esophageal Phase WFL Pudding Teaspoon -- Pudding Cup -- Honey Teaspoon -- Honey Cup -- Nectar Teaspoon -- Nectar Cup -- Nectar Straw -- Thin Teaspoon -- Thin Cup -- Thin Straw -- Puree -- Mechanical Soft -- Regular -- Multi-consistency -- Pill -- Cervical Esophageal Comment -- No flowsheet data found. Maxcine HamLaura Paiewonsky, M.A. CCC-SLP [phone] Maxcine Hamaiewonsky, Laura 11/07/2015, 1:20 PM               Lab Data:  CBC:  Recent Labs Lab 12/02/15 1719 12/02/15 2349 12/03/15 0413 12/04/15 0457  WBC 6.2 6.4 5.3 5.1  HGB 11.1* 11.4* 10.3* 10.0*  HCT 37.0* 37.4* 34.1* 33.0*  MCV 92.3 91.7 90.9 92.7  PLT 162 183 167 158   Basic Metabolic Panel:  Recent Labs Lab 12/02/15 1719 12/03/15 0413 12/04/15 0912  NA 138 139 139  K 4.5 3.8 4.3  CL 105 102 103  CO2 26 28 26   GLUCOSE 178* 158* 223*  BUN 9 11 17   CREATININE 1.21 1.34* 1.49*  CALCIUM 9.0 9.0 9.1   GFR: Estimated Creatinine Clearance: 64.5 mL/min (by C-G formula based on Cr of 1.49). Liver Function Tests: No results for  input(s): AST, ALT, ALKPHOS, BILITOT, PROT, ALBUMIN in the last 168 hours. No results for input(s): LIPASE, AMYLASE in the last 168 hours. No results for input(s): AMMONIA in the last 168 hours. Coagulation Profile:  Recent Labs Lab 12/02/15 1719  INR 1.09   Cardiac Enzymes:  Recent Labs Lab 12/02/15 1719 12/02/15 2349 12/03/15 0413 12/03/15 1148  TROPONINI 0.04* 0.06* 0.06* 0.06*   BNP (last 3 results) No results for input(s): PROBNP in the last 8760 hours. HbA1C: No results for input(s): HGBA1C in the last 72 hours. CBG:  Recent Labs Lab 12/03/15 1127 12/03/15 1608 12/03/15 2201 12/04/15 0736 12/04/15 1140  GLUCAP 149* 157* 209* 183* 165*   Lipid Profile: No results for input(s): CHOL, HDL, LDLCALC, TRIG, CHOLHDL, LDLDIRECT in the last 72 hours. Thyroid Function Tests: No results for input(s): TSH, T4TOTAL, FREET4, T3FREE, THYROIDAB in the last 72 hours. Anemia Panel: No results for input(s): VITAMINB12, FOLATE, FERRITIN, TIBC, IRON, RETICCTPCT in the last 72 hours. Urine analysis:    Component Value Date/Time   COLORURINE AMBER* 10/29/2015 1310   APPEARANCEUR CLEAR 10/29/2015 1310   LABSPEC 1.025 10/29/2015 1310   PHURINE 6.0 10/29/2015 1310   GLUCOSEU NEGATIVE 10/29/2015 1310   HGBUR TRACE* 10/29/2015 1310   BILIRUBINUR SMALL* 10/29/2015 1310   KETONESUR NEGATIVE 10/29/2015 1310   PROTEINUR 30* 10/29/2015 1310   UROBILINOGEN 1.0 08/01/2014 1417   NITRITE NEGATIVE 10/29/2015 1310   LEUKOCYTESUR NEGATIVE 10/29/2015 1310     RAI,RIPUDEEP M.D. [facility] 12/04/2015, 2:07 PM  Pager: [phone] Between 7am to 7pm - call Pager - [phone]  After 7pm go to [email or web address] - password [email or web address]  Call night coverage person covering after 7pm

## 2015-12-04 NOTE — Progress Notes (Signed)
PULMONARY / CRITICAL CARE MEDICINE Consult Note   Name: Tyler DeutscherDwight E Strutz MRN: [id number] DOB: September 12, 1945    ADMISSION DATE:  12/02/2015 CONSULTATION DATE:  12/02/15  REFERRING MD:  Cathren Harshipudeep K Rai, MD  CHIEF COMPLAINT:  Dyspnea  HISTORY OF PRESENT ILLNESS:   [name] is a 70 y/o man with a hx of recent redo CABG (4/28) with prolonged hospitalization and prolonged vent dependence requiring percuntanous  tracheostomy placement (10/27/15) and was decannulated 5/21. He was discharged to a SNF, and eventually went home. He presented to the ED with dyspnea and was found to have a saddle PE. He had a positive troponin (0.04) and some radiographic evidence of right heart strain. RV/LV ratio was not reported. Given these concerns, PCCM was consulted regarding advanced therapies for PE.  VITAL SIGNS: BP 139/80 mmHg  Pulse 101  Temp(Src) 98 F (36.7 C) (Oral)  Resp 20  Ht 6\' 3"  (1.905 m)  Wt 127.551 kg (281 lb 3.2 oz)  BMI 35.15 kg/m2  SpO2 95%  PHYSICAL EXAMINATION: General:  Obese man, no acute distress Neuro:  Awake, alert, oriented HEENT: moist mucus membranes Cardiovascular:  Elevated HR, Sternotomy scar seen Lungs:  Bibasilar crackles. Abdomen:  obese, nontender Musculoskeletal:  Deferred  Skin:  No rashes on visible skin  LABS:  BMET  Recent Labs Lab 12/02/15 1719 12/03/15 0413 12/04/15 0912  NA 138 139 139  K 4.5 3.8 4.3  CL 105 102 103  CO2 26 28 26   BUN 9 11 17   CREATININE 1.21 1.34* 1.49*  GLUCOSE 178* 158* 223*    Electrolytes  Recent Labs Lab 12/02/15 1719 12/03/15 0413 12/04/15 0912  CALCIUM 9.0 9.0 9.1    CBC  Recent Labs Lab 12/02/15 2349 12/03/15 0413 12/04/15 0457  WBC 6.4 5.3 5.1  HGB 11.4* 10.3* 10.0*  HCT 37.4* 34.1* 33.0*  PLT 183 167 158    Coag's  Recent Labs Lab 12/02/15 1719  INR 1.09    Sepsis Markers No results for input(s): LATICACIDVEN, PROCALCITON, O2SATVEN in the last 168 hours.  ABG  Recent Labs Lab  12/02/15 1805  PHART 7.363  PCO2ART 44.5  PO2ART 174.0*    Liver Enzymes No results for input(s): AST, ALT, ALKPHOS, BILITOT, ALBUMIN in the last 168 hours.  Cardiac Enzymes  Recent Labs Lab 12/02/15 2349 12/03/15 0413 12/03/15 1148  TROPONINI 0.06* 0.06* 0.06*    Glucose  Recent Labs Lab 12/03/15 0723 12/03/15 1127 12/03/15 1608 12/03/15 2201 12/04/15 0736 12/04/15 1140  GLUCAP 131* 149* 157* 209* 183* 165*    Imaging No results found.  ASSESSMENT / PLAN:  Acute submassive PE, provoked  - Continue heparin.  - No tPA.  Hypoxemia:  - Titrate O2 for sat of >92% given pulmonary HTN..  - Ambulatory desat study prior to discharge for home O2.  Pulmonary HTN  - Treat PE with heparin.  - Target O2 sat of 92% or higher given pulmonary HTN.  DVT  - Heparin.  - No need for IVC filter for now.  PCCM will sign off, please call back if needed.  Alyson ReedyWesam G. Lakiah Dhingra, M.D. [facility]. Pager: [phone]. After hours pager: [phone].  12/04/2015, 1:55 PM

## 2015-12-04 NOTE — Care Management Note (Addendum)
Case Management Note  Patient Details  Name: Tyler Le MRN: [id number] Date of Birth: 1945-07-30  Subjective/Objective:   Pt admitted for PE- On IV Heparin gtt. CM received consult for medication assistance. Benefits check sent out for Eliquis and Xarelto.                  Action/Plan:Once Benefits check completed. CM will make pt aware of cost. CM will continue to monitor for additional home needs.    Expected Discharge Date:                  Expected Discharge Plan:  Home w Home Health Services  In-House Referral:  NA  Discharge planning Services  CM Consult  Post Acute Care Choice:    Choice offered to:     DME Arranged:    DME Agency:     HH Arranged:    HH Agency:     Status of Service:  In process, will continue to follow  Medicare Important Message Given:    Date Medicare IM Given:    Medicare IM give by:    Date Additional Medicare IM Given:    Additional Medicare Important Message give by:     If discussed at Long Length of Stay Meetings, dates discussed:    Additional Comments: 1620 12-05-15 Tomi BambergerBrenda Graves-Bigelow, RN,BSN [id number] CM will provide pt with 30 day free Eliquis card. CM will continue to monitor for 02 need at home as well.   S/W SHATRREYNA @ [facility] # [phone]   ELIQUIS 2.5G BID  AND 5 MG BID  ( 30 )   COVER- YES           YES  CO-PAY- $35.00         SAME  TIER- 3 DRUG          SAME  PRIOR APPROVAL - YES # [phone]   SAME   XARELTO 20 MG PO DAILY  ( 30 )   COVER- YES  CO-PAY- $ 35.00  TIER- 3 DRUG  PRIOR APPROVAL - YES # [phone]   PHARMACY : ANY RETAIL  Gala LewandowskyGraves-Bigelow, Cerissa Zeiger Kaye, RN 12/04/2015, 2:28 PM

## 2015-12-04 NOTE — Progress Notes (Signed)
CT surgery  Patient improving after being admitted for saddle pulmonary embolus Still dependent on 4 L nasal cannula for ambulation Echocardiogram performed today personally reviewed showing normal biventricular function  Agree with plan to convert to  NOAC, preferably  Eliquis.  Surgical incisions remain clean and dry No evidence of heart failure on exam Appreciate internal medicine excellent care Zenaida NieceVan trigt M.D.

## 2015-12-04 NOTE — Progress Notes (Signed)
ANTICOAGULATION CONSULT NOTE  Pharmacy Consult for Heparin Indication: PE   Allergies  Allergen Reactions  . Metformin And Related Nausea And Vomiting  . Pravachol Nausea And Vomiting  . Ramipril Nausea Only  . Wellbutrin [Bupropion Hcl] Nausea And Vomiting    Patient Measurements: Heparin Dosing Weight: 88 kg Weight = 127 kg (approx)  Vital Signs: Temp: 98.4 F (36.9 C) (06/12 0500) BP: 153/81 mmHg (06/12 0500) Pulse Rate: 90 (06/12 0500)  Labs:  Recent Labs  12/02/15 1719 12/02/15 2349 12/03/15 0413 12/03/15 1148 12/03/15 1516 12/04/15 0457  HGB 11.1* 11.4* 10.3*  --   --  10.0*  HCT 37.0* 37.4* 34.1*  --   --  33.0*  PLT 162 183 167  --   --  158  LABPROT 14.3  --   --   --   --   --   INR 1.09  --   --   --   --   --   HEPARINUNFRC  --   --  0.22*  --  0.43 0.35  CREATININE 1.21  --  1.34*  --   --   --   TROPONINI 0.04* 0.06* 0.06* 0.06*  --   --     Estimated Creatinine Clearance: 71.7 mL/min (by C-G formula based on Cr of 1.34).   Assessment: 70 year old male continues on heparin for PE Heparin level is low-end therapeutic at 0.35 on 1650 units/hr. Hgb 10, Pltc 158, stable.   Goal of Therapy:  Heparin level 0.3-0.7 units/ml Monitor platelets by anticoagulation protocol: Yes   Plan:  Increase heparin rate slightly to 1700 units / hr to stay in therapeutic range. Follow up AM labs  Monitor for conversion to po  Thank you  Bayard HuggerMei Shronda Boeh, PharmD, BCPS  Clinical Pharmacist  Pager: [phone]  12/04/2015,8:27 AM

## 2015-12-04 NOTE — Progress Notes (Signed)
  Echocardiogram 2D Echocardiogram has been performed.  Tyler Le, Tyler Le R 12/04/2015, 11:44 AM

## 2015-12-05 ENCOUNTER — Ambulatory Visit: Payer: Medicare Other | Admitting: Vascular Surgery

## 2015-12-05 LAB — CBC
HCT: 32 % — ABNORMAL LOW (ref 39.0–52.0)
Hemoglobin: 9.7 g/dL — ABNORMAL LOW (ref 13.0–17.0)
MCH: 27.7 pg (ref 26.0–34.0)
MCHC: 30.3 g/dL (ref 30.0–36.0)
MCV: 91.4 fL (ref 78.0–100.0)
PLATELETS: 149 10*3/uL — AB (ref 150–400)
RBC: 3.5 MIL/uL — ABNORMAL LOW (ref 4.22–5.81)
RDW: 15.8 % — AB (ref 11.5–15.5)
WBC: 5 10*3/uL (ref 4.0–10.5)

## 2015-12-05 LAB — BASIC METABOLIC PANEL
Anion gap: 8 (ref 5–15)
BUN: 16 mg/dL (ref 6–20)
CALCIUM: 8.8 mg/dL — AB (ref 8.9–10.3)
CO2: 29 mmol/L (ref 22–32)
CREATININE: 1.23 mg/dL (ref 0.61–1.24)
Chloride: 102 mmol/L (ref 101–111)
GFR calc Af Amer: >60 mL/min (ref >60)
GFR, EST NON AFRICAN AMERICAN: 57 mL/min — AB (ref >60)
Glucose, Bld: 156 mg/dL — ABNORMAL HIGH (ref 65–99)
Potassium: 3.8 mmol/L (ref 3.5–5.1)
SODIUM: 139 mmol/L (ref 135–145)

## 2015-12-05 LAB — GLUCOSE, CAPILLARY
GLUCOSE-CAPILLARY: 145 mg/dL — AB (ref 65–99)
GLUCOSE-CAPILLARY: 212 mg/dL — AB (ref 65–99)
Glucose-Capillary: 150 mg/dL — ABNORMAL HIGH (ref 65–99)
Glucose-Capillary: 214 mg/dL — ABNORMAL HIGH (ref 65–99)

## 2015-12-05 LAB — HEPARIN LEVEL (UNFRACTIONATED): Heparin Unfractionated: 0.39 IU/mL (ref 0.30–0.70)

## 2015-12-05 MED ORDER — APIXABAN 5 MG PO TABS: 5.0000 mg | ORAL_TABLET | Freq: Two times a day (BID) | ORAL | Status: DC

## 2015-12-05 MED ORDER — ASPIRIN 81 MG PO CHEW
81.0000 mg | CHEWABLE_TABLET | Freq: Every day | ORAL | Status: DC
  Administered 2015-12-05: 81 mg via ORAL
  Filled 2015-12-05: qty 1

## 2015-12-05 MED ORDER — APIXABAN 5 MG PO TABS
10.0000 mg | ORAL_TABLET | Freq: Two times a day (BID) | ORAL | Status: DC
  Administered 2015-12-05 – 2015-12-06 (×3): 10 mg via ORAL
  Filled 2015-12-05 (×3): qty 2

## 2015-12-05 NOTE — Progress Notes (Addendum)
ANTICOAGULATION CONSULT NOTE  Pharmacy Consult for Heparin Indication: PE   Allergies  Allergen Reactions  . Metformin And Related Nausea And Vomiting  . Pravachol Nausea And Vomiting  . Ramipril Nausea Only  . Wellbutrin [Bupropion Hcl] Nausea And Vomiting    Patient Measurements: Heparin Dosing Weight: 88 kg Weight = 127 kg (approx)  Vital Signs: Temp: 98.8 F (37.1 C) (06/13 0800) Temp Source: Oral (06/13 0800) BP: 127/69 mmHg (06/13 0800) Pulse Rate: 107 (06/13 0800)  Labs:  Recent Labs  12/02/15 1719 12/02/15 2349  12/03/15 0413 12/03/15 1148 12/03/15 1516 12/04/15 0457 12/04/15 0912 12/05/15 0425 12/05/15 0426  HGB 11.1* 11.4*  --  10.3*  --   --  10.0*  --   --  9.7*  HCT 37.0* 37.4*  --  34.1*  --   --  33.0*  --   --  32.0*  PLT 162 183  --  167  --   --  158  --   --  149*  LABPROT 14.3  --   --   --   --   --   --   --   --   --   INR 1.09  --   --   --   --   --   --   --   --   --   HEPARINUNFRC  --   --   < > 0.22*  --  0.43 0.35  --  0.39  --   CREATININE 1.21  --   --  1.34*  --   --   --  1.49*  --  1.23  TROPONINI 0.04* 0.06*  --  0.06* 0.06*  --   --   --   --   --   < > = values in this interval not displayed.  Estimated Creatinine Clearance: 77.9 mL/min (by C-G formula based on Cr of 1.23).   Assessment: 70 year old male continues on heparin for PE Heparin level is therapeutic at 0.39 on 1700 units/hr. Hgb 9.7, Pltc 149, stable.   Goal of Therapy:  Heparin level 0.3-0.7 units/ml Monitor platelets by anticoagulation protocol: Yes   Plan:  Continue heparin 1700 units / hr. Follow up AM labs Monitor for conversion to po  Thank you  Bayard HuggerMei Draylen Lobue, PharmD, BCPS  Clinical Pharmacist  Pager: [phone]  12/05/2015,8:28 AM  Addendum: Will start eliquis for anticoagulation. Scr 1.23, trending down. Est,. crcl ~ 70 ml/min.  Plan: Eliquis 10 mg po BID x 7 days, first dose now, ok to give next dose tonight D/c heparin when first dose eliquis  is given. Eliquis education.  Bayard HuggerMei Olivette Beckmann, PharmD, BCPS  Clinical Pharmacist  Pager: [phone]

## 2015-12-05 NOTE — Progress Notes (Signed)
SATURATION QUALIFICATIONS: (This note is used to comply with regulatory documentation for home oxygen)  Patient Saturations on Room Air at Rest = 87%  Patient Saturations on Room Air while Ambulating =   Patient Saturations on  Liters of oxygen while Ambulating =   Please briefly explain why patient needs home oxygen: At rest on room air patient desat to 87% within 1 minute, patient put on 3L at rest 90%, back to 4L 95%.

## 2015-12-05 NOTE — Progress Notes (Signed)
[facility]                                                                              Patient Demographics  Tyler Le, is a 70 y.o. male, DOB - [birth year], BMW:[id number]  Admit date - 12/02/2015   Admitting Physician Tyler Curie, MD  Outpatient Primary MD for the patient is Tyler Mask, MD  Outpatient specialists:   LOS - 3  days    Chief Complaint  Patient presents with  . Shortness of Breath       Brief summary  Patient is a 70 year old male with CAD status post CABG in 4/ 2017, peripheral vascular disease status post endarterectomy 4/17 who was recently discharged home after a prolonged hospitalization in April presented with increased dyspnea on exertion and shortness of breath for the last 2 days. He also noticed swelling in the lower extremities in the last few days. CT angiogram of the chest showed large saddle pulmonary embolism. Patient was admitted for further workup.    Assessment & Plan    Principal Problem:   Acute hypoxic respiratory failure with saddle Pulmonary embolism (HCC) - She was placed on IV heparin drip, will transition to NOAC (eliquis) today - Pulmonology and CTVS following, high risk for major bleeding with TPA given recent surgeries, so no TPA -  wean O2 as tolerated, will need home O2 - 2-D echo showed EF of 55-60% with grade 2 diastolic dysfunction, right ventricle systolic function mildly reduced, PA pressure moderately increased  Right leg DVT -Doppler ultrasound of the lower extremities positive for Acute nonocclusive DVT in the right common femoral vein and SVT at saphenofemoral junction - On anticoagulation    S/P CABG (coronary artery bypass graft), CAD - Currently stable, continue amlodipine, aspirin, Coreg, Plavix, Zetia, Lasix - Mild elevation in the troponins likely due to large saddle emboli - 2-D echo shows preserved EF    Type II diabetes mellitus (HCC) - Continue sliding scale and  Lantus    Essential hypertension - Currently stable, continue Coreg, Lasix  CKD stage III - Creatinine stable   Code Status: Full CODE STATUS DVT Prophylaxis:  Heparin drip Family Communication: Discussed in detail with the patient, all imaging results, lab results explained to the patient   Disposition Plan: Plan DC in 24 hours  Time Spent in minutes   25 minutes  Procedures:  CT angiogram of the chest 2-D echo Doppler ultrasound lower extremities  Consultants:   Pulmonology CTVS  Antimicrobials:      Medications  Scheduled Meds: . amLODipine  5 mg Oral QHS  . busPIRone  15 mg Oral Daily  . carvedilol  12.5 mg Oral BID WC  . cholecalciferol  4,000 Units Oral QHS  . clopidogrel  75 mg Oral Daily  . DULoxetine  60 mg Oral QHS  . ezetimibe  10 mg Oral Daily  . hydrocortisone  25 mg Rectal BID  . insulin aspart  0-15 Units Subcutaneous TID WC  . insulin aspart  0-5 Units Subcutaneous QHS  . insulin aspart  24 Units Subcutaneous BID WC  . insulin glargine  64 Units Subcutaneous QHS  .  isosorbide mononitrate  60 mg Oral QHS  . linaclotide  145 mcg Oral QODAY  . mirtazapine  15 mg Oral QHS  . pantoprazole  40 mg Oral Daily  . potassium chloride SA  20 mEq Oral Daily  . QUEtiapine  400 mg Oral QHS  . sodium chloride flush  3 mL Intravenous Q12H  . sodium chloride flush  3 mL Intravenous Q12H  . witch hazel-glycerin   Topical TID   Continuous Infusions: . heparin 1,700 Units/hr (12/04/15 1103)   PRN Meds:.sodium chloride, acetaminophen **OR** acetaminophen, albuterol, HYDROcodone-acetaminophen, morphine injection, nitroGLYCERIN, ondansetron **OR** ondansetron (ZOFRAN) IV, sodium chloride flush, zolpidem   Antibiotics   Anti-infectives    None        Subjective:   Tyler Le was seen and examined today. Shortness of breath with minimal exertion, O2 sats 95% on 5 L. Currently no chest pain.  Patient denies dizziness, abdominal pain, N/V/D/C, new  weakness, numbess, tingling. No acute events overnight.    Objective:   Filed Vitals:   12/05/15 0352 12/05/15 0614 12/05/15 0800 12/05/15 1133  BP: 112/59  127/69 126/68  Pulse: 95  107 108  Temp: 98 F (36.7 C)  98.8 F (37.1 C) 98.8 F (37.1 C)  TempSrc: Oral  Oral Oral  Resp: 18  25 21   Height:      Weight:  127.007 kg (280 lb)    SpO2: 96%  96% 95%    Intake/Output Summary (Last 24 hours) at 12/05/15 1212 Last data filed at 12/05/15 1134  Gross per 24 hour  Intake 1414.47 ml  Output   1626 ml  Net -211.53 ml     Wt Readings from Last 3 Encounters:  12/05/15 127.007 kg (280 lb)  11/14/15 127.1 kg (280 lb 3.3 oz)  10/11/15 131.906 kg (290 lb 12.8 oz)     Exam  General: Alert and oriented x 3, NAD  HEENT:    Neck: No JVD  Cardiovascular: S1 S2clear, RRR. Surgical sternotomy scar well healing  Respiratory: Decreased breath sounds at the bases  Gastrointestinal: Obese Soft, nontender, nondistended, + bowel sounds  Ext: no cyanosis clubbing, 1+ edema  Neuro: no new deficits  Skin: No rashes  Psych: Normal affect and demeanor, alert and oriented x3    Data Reviewed:  I have personally reviewed following labs and imaging studies  Micro Results Recent Results (from the past 240 hour(s))  MRSA PCR Screening     Status: None   Collection Time: 12/02/15 11:24 PM  Result Value Ref Range Status   MRSA by PCR NEGATIVE NEGATIVE Final    Comment:        The GeneXpert MRSA Assay (FDA approved for NASAL specimens only), is one component of a comprehensive MRSA colonization surveillance program. It is not intended to diagnose MRSA infection nor to guide or monitor treatment for MRSA infections.     Radiology Reports Dg Chest 2 View  12/02/2015  CLINICAL DATA:  Severe shortness of breath when walking at home today. Recent CABG. EXAM: CHEST  2 VIEW COMPARISON:  11/08/2015. FINDINGS: Stable enlarged cardiac silhouette and post CABG changes. The  tracheostomy tube has been removed. Small amount of linear density at both lung bases. Stable small left pleural effusion. Thoracic spine degenerative changes. IMPRESSION: 1. Interval small amount of linear atelectasis or scarring at both lung bases. 2. Stable cardiomegaly. 3. Stable small left pleural effusion. Electronically Signed   By: Beckie Salts M.D.   On: 12/02/2015 19:01  Dg Chest 2 View  11/08/2015  CLINICAL DATA:  Post CABG, soreness and weakness EXAM: CHEST  2 VIEW COMPARISON:  Portable chest x-ray of 11/07/2015 FINDINGS: Aeration of the lung bases has improved. There is still a small left pleural effusion present with mild left basilar atelectasis. Tracheostomy is unchanged in position. Right central venous line remains. Heart size is stable. IMPRESSION: Slightly better aeration. Persistent left basilar atelectasis and small left pleural effusion. Electronically Signed   By: Dwyane Dee M.D.   On: 11/08/2015 08:13   Ct Angio Chest Pe W/cm &/or Wo Cm  12/02/2015  CLINICAL DATA:  70 year old male with history of recent open heart surgery presenting with shortness of breath EXAM: CT ANGIOGRAPHY CHEST WITH CONTRAST TECHNIQUE: Multidetector CT imaging of the chest was performed using the standard protocol during bolus administration of intravenous contrast. Multiplanar CT image reconstructions and MIPs were obtained to evaluate the vascular anatomy. CONTRAST:  100 cc Isovue 370 COMPARISON:  Chest radiograph dated 12/02/2015 FINDINGS: The lungs are clear. There is a small left pleural effusion. No pneumothorax. The central airways are patent. There is mild moderate atherosclerotic calcification of the thoracic aorta. There is mild dilatation of the superior aspect of the descending thoracic aorta measuring up to 3.6 cm in diameter. The origins of the great vessels of the aortic arch appear patent. There is a large saddle embolus straddling the bifurcation of the main pulmonary trunk and extending to  the central pulmonary arteries bilaterally. There is mild enlargement of the right ventricle and right atrium with flattening of the interventricular septum compatible with a degree of right cardiac straining. Correlation with clinical exam and echocardiogram recommended. There is postsurgical changes of CABG with multiple vascular clips. There is a 2.5 x 2.7 cm loculated high attenuating fluid superior to the pericardium on the left likely a focal seroma or hematoma. There is no hilar or mediastinal adenopathy. The esophagus is grossly unremarkable. There is an ill-defined 2 cm right thyroid nodule with calcified focus. There is no axillary adenopathy. Median sternotomy wires and midline anterior chest wall surgical incision. No hematoma. There is degenerative change of of the spine. No acute fracture. There is retrograde flow of contrast from the right atrium into the IVC compatible with a degree of right cardiac dysfunction. Review of the MIP images confirms the above findings. IMPRESSION: Large saddle embolus with findings of right cardiac straining. Postsurgical changes of CABG. Small loculated fluid superior to the pericardium on the left may represent small hematoma or seroma. These results were called by telephone at the time of interpretation on 12/02/2015 at 9:23 pm to Dr. Glynn Octave , who verbally acknowledged these results. Electronically Signed   By: Elgie Collard M.D.   On: 12/02/2015 21:31   Dg Chest Port 1 View  11/07/2015  CLINICAL DATA:  Redo CABG EXAM: PORTABLE CHEST 1 VIEW FINDINGS: Tubular device is stable. Cardiomegaly stable. Left basilar opacity likely a combination of volume loss and pleural fluid is stable. Volume loss at the right base has improved. IMPRESSION: Improved volume loss at the right base. Stable left basilar opacity is described. Electronically Signed   By: Jolaine Click M.D.   On: 11/07/2015 07:51   Dg Swallowing Func-speech Pathology  11/07/2015  Objective  Swallowing Evaluation: Type of Study: MBS-Modified Barium Swallow Study Patient Details Name: STEPHANOS FAN MRN: [id number] Date of Birth: February 27, 1946 Today's Date: 11/07/2015 Time: SLP Start Time (ACUTE ONLY): 1013-SLP Stop Time (ACUTE ONLY): 1028 SLP Time Calculation (min) (  ACUTE ONLY): 15 min Past Medical History: Past Medical History Diagnosis Date . Essential hypertension  . Dyslipidemia  . Coronary artery disease    a. 2001: s/p CABG X4;  b. 08/2010 s/p PCI/BMS to VG->RI->OM;  c. 06/2014 Cath/PCI: LM nl, LAD 161m, LCX 82m, RI 100, OM1 100, OM2 80, small, RCA 100ost, VG->RCA 100, VG->Diag 100, VG->RI->OM1 70-80 ISR (4.0x24 Promus DES), LIMA->LAD nl. . Morbid obesity (HCC)  . Dysrhythmia  . History of blood transfusion 2001   "related to OHS" . Anemia  . GERD (gastroesophageal reflux disease)  . Depression  . Anxiety  . Nephrolithiasis  . Complication of anesthesia    "they have a hard time waking him up" . Type II diabetes mellitus (HCC)  . Arthritis    "back, neck" (07/11/2014) . Chronic lower back pain  . Thrombocytopenia (HCC)    a. Borderline low platelets by labs noted in 06/2014 but also noted on prior labs as well. . Renal insufficiency    a. noted 06/2014 - baseline unclear as no data since 2012. Marland Kitchen RBBB  Past Surgical History: Past Surgical History Procedure Laterality Date . Cardiac catheterization  2001; ~ 2010 . Coronary artery bypass graft  2001   "CABG X 5" . Appendectomy  ~ 1955 . Lumbar laminectomy  01/2011 . Back surgery   . Cholecystectomy   . Shoulder arthroscopy w/ rotator cuff repair Left 1990's . Cardiovascular stress test  07/03/2009   EF 64% . Left heart catheterization with coronary/graft angiogram N/A 05/09/2011   Procedure: LEFT HEART CATHETERIZATION WITH Isabel Caprice;  Surgeon: Kathleene Hazel, MD;  Location: [facility] CATH LAB;  Service: Cardiovascular;  Laterality: N/A; . Tonsillectomy and adenoidectomy  1951 . Left heart catheterization with coronary angiogram N/A 07/11/2014    Procedure: LEFT HEART CATHETERIZATION WITH CORONARY ANGIOGRAM;  Surgeon: Peter M Swaziland, MD;  Location: [facility] CATH LAB;  Service: Cardiovascular;  Laterality: N/A; . Coronary angioplasty with stent placement  March 2012   BMS to SVG to intermediate/OM . Coronary angioplasty with stent placement  07/11/2014   "1" . Cardiac catheterization N/A 10/16/2015   Procedure: Left Heart Cath and Cors/Grafts Angiography;  Surgeon: Corky Crafts, MD;  Location: [facility] INVASIVE CV LAB;  Service: Cardiovascular;  Laterality: N/A; . Coronary artery bypass graft N/A 10/20/2015   Procedure: REDO CORONARY ARTERY BYPASS GRAFTING (CABG), TIMES THREE, USING LEFT GREATER SAPHENOUS VEIN HARVESTED ENDOSCOPICALLY AND CRYO VEIN, WITH CORONARY ENDARTERECTOMY;  Surgeon: Kerin Perna, MD;  Location: [facility] OR;  Service: Open Heart Surgery;  Laterality: N/A;  SVG to LAD, SVG to OM, CRYOVEIN to RCA with Endarterectomy . Tee without cardioversion N/A 10/20/2015   Procedure: TRANSESOPHAGEAL ECHOCARDIOGRAM (TEE);  Surgeon: Kerin Perna, MD;  Location: [facility] OR;  Service: Open Heart Surgery;  Laterality: N/A; . Endarterectomy Right 10/20/2015   Procedure: ENDARTERECTOMY CAROTID;  Surgeon: Pryor Ochoa, MD;  Location: [facility] OR;  Service: Vascular;  Laterality: Right; HPI: pt presents with Cardiac Arrest and Redo of CABG x3. pt intubated 4/24 and with Trach on 5/5. pt with hx of CABG, Anxiety, HTN, DM, CAD, Renal Insufficiency, and Back Surgeries.  Subjective: "I'm tired" Assessment / Plan / Recommendation CHL IP CLINICAL IMPRESSIONS 11/07/2015 Therapy Diagnosis Mild pharyngeal phase dysphagia Clinical Impression Pt has a mild pharyngeal dysphagia due to delay in swallow trigger and reduced movement of his hyolaryngeal musculature, leading to incomplete airway protection during the swallow and moderate vallecular residue. Silent penetration occurs with thin liquids, requiring cues for multiple throat  clears and coughs to clear. Nectar thick liquids reach the  pyriform sinuses prior to swallwo triggger, also putting them at risk for airway invasion, although they are better contained during single cup sips. He appears to have good sensation of residue in his valleculae, as he spontaneously brings the residue back into his oral cavity and reswallows until it is better cleared. Recommend Dys 2 diet to reduce the amount of pharyngeal residue and nectar thick liquids by cup. Pt should have PMV in place during all PO intake. Will continue to follow and progress as able. Impact on safety and function Mild aspiration risk   CHL IP TREATMENT RECOMMENDATION 11/07/2015 Treatment Recommendations Therapy as outlined in treatment plan below   Prognosis 11/07/2015 Prognosis for Safe Diet Advancement Good Barriers to Reach Goals -- Barriers/Prognosis Comment -- CHL IP DIET RECOMMENDATION 11/07/2015 SLP Diet Recommendations Dysphagia 2 (Fine chop) solids;Nectar thick liquid Liquid Administration via Cup;No straw Medication Administration Crushed with puree Compensations Slow rate;Small sips/bites;Multiple dry swallows after each bite/sip Postural Changes Seated upright at 90 degrees   CHL IP OTHER RECOMMENDATIONS 11/07/2015 Recommended Consults -- Oral Care Recommendations Oral care BID Other Recommendations Order thickener from pharmacy;Prohibited food (jello, ice cream, thin soups);Remove water pitcher   CHL IP FOLLOW UP RECOMMENDATIONS 11/07/2015 Follow up Recommendations Inpatient Rehab   CHL IP FREQUENCY AND DURATION 11/07/2015 Speech Therapy Frequency (ACUTE ONLY) min 2x/week Treatment Duration 2 weeks      CHL IP ORAL PHASE 11/07/2015 Oral Phase WFL Oral - Pudding Teaspoon -- Oral - Pudding Cup -- Oral - Honey Teaspoon -- Oral - Honey Cup -- Oral - Nectar Teaspoon -- Oral - Nectar Cup -- Oral - Nectar Straw -- Oral - Thin Teaspoon -- Oral - Thin Cup -- Oral - Thin Straw -- Oral - Puree -- Oral - Mech Soft -- Oral - Regular -- Oral - Multi-Consistency -- Oral - Pill -- Oral Phase -  Comment --  CHL IP PHARYNGEAL PHASE 11/07/2015 Pharyngeal Phase Impaired Pharyngeal- Pudding Teaspoon -- Pharyngeal -- Pharyngeal- Pudding Cup -- Pharyngeal -- Pharyngeal- Honey Teaspoon -- Pharyngeal -- Pharyngeal- Honey Cup -- Pharyngeal -- Pharyngeal- Nectar Teaspoon -- Pharyngeal -- Pharyngeal- Nectar Cup Delayed swallow initiation-vallecula;Reduced epiglottic inversion;Reduced anterior laryngeal mobility;Reduced laryngeal elevation;Reduced airway/laryngeal closure;Reduced tongue base retraction;Pharyngeal residue - valleculae Pharyngeal -- Pharyngeal- Nectar Straw Reduced epiglottic inversion;Reduced anterior laryngeal mobility;Reduced laryngeal elevation;Reduced airway/laryngeal closure;Reduced tongue base retraction;Pharyngeal residue - valleculae;Delayed swallow initiation-pyriform sinuses Pharyngeal -- Pharyngeal- Thin Teaspoon -- Pharyngeal -- Pharyngeal- Thin Cup Reduced epiglottic inversion;Reduced anterior laryngeal mobility;Reduced laryngeal elevation;Reduced airway/laryngeal closure;Reduced tongue base retraction;Pharyngeal residue - valleculae;Delayed swallow initiation-pyriform sinuses;Penetration/Aspiration before swallow Pharyngeal Material enters airway, remains ABOVE vocal cords and not ejected out Pharyngeal- Thin Straw -- Pharyngeal -- Pharyngeal- Puree Delayed swallow initiation-vallecula;Reduced epiglottic inversion;Reduced anterior laryngeal mobility;Reduced laryngeal elevation;Reduced airway/laryngeal closure;Reduced tongue base retraction;Pharyngeal residue - valleculae Pharyngeal -- Pharyngeal- Mechanical Soft Delayed swallow initiation-vallecula;Reduced epiglottic inversion;Reduced anterior laryngeal mobility;Reduced laryngeal elevation;Reduced airway/laryngeal closure;Reduced tongue base retraction;Pharyngeal residue - valleculae Pharyngeal -- Pharyngeal- Regular -- Pharyngeal -- Pharyngeal- Multi-consistency -- Pharyngeal -- Pharyngeal- Pill -- Pharyngeal -- Pharyngeal Comment --  CHL  IP CERVICAL ESOPHAGEAL PHASE 11/07/2015 Cervical Esophageal Phase WFL Pudding Teaspoon -- Pudding Cup -- Honey Teaspoon -- Honey Cup -- Nectar Teaspoon -- Nectar Cup -- Nectar Straw -- Thin Teaspoon -- Thin Cup -- Thin Straw -- Puree -- Mechanical Soft -- Regular -- Multi-consistency -- Pill -- Cervical Esophageal Comment -- No flowsheet data found. Maxcine Ham, M.A. CCC-SLP [id number] Maxcine Ham 11/07/2015, 1:20  PM               Lab Data:  CBC:  Recent Labs Lab 12/02/15 1719 12/02/15 2349 12/03/15 0413 12/04/15 0457 12/05/15 0426  WBC 6.2 6.4 5.3 5.1 5.0  HGB 11.1* 11.4* 10.3* 10.0* 9.7*  HCT 37.0* 37.4* 34.1* 33.0* 32.0*  MCV 92.3 91.7 90.9 92.7 91.4  PLT 162 183 167 158 149*   Basic Metabolic Panel:  Recent Labs Lab 12/02/15 1719 12/03/15 0413 12/04/15 0912 12/05/15 0426  NA 138 139 139 139  K 4.5 3.8 4.3 3.8  CL 105 102 103 102  CO2 26 28 26 29   GLUCOSE 178* 158* 223* 156*  BUN 9 11 17 16   CREATININE 1.21 1.34* 1.49* 1.23  CALCIUM 9.0 9.0 9.1 8.8*   GFR: Estimated Creatinine Clearance: 77.9 mL/min (by C-G formula based on Cr of 1.23). Liver Function Tests: No results for input(s): AST, ALT, ALKPHOS, BILITOT, PROT, ALBUMIN in the last 168 hours. No results for input(s): LIPASE, AMYLASE in the last 168 hours. No results for input(s): AMMONIA in the last 168 hours. Coagulation Profile:  Recent Labs Lab 12/02/15 1719  INR 1.09   Cardiac Enzymes:  Recent Labs Lab 12/02/15 1719 12/02/15 2349 12/03/15 0413 12/03/15 1148  TROPONINI 0.04* 0.06* 0.06* 0.06*   BNP (last 3 results) No results for input(s): PROBNP in the last 8760 hours. HbA1C: No results for input(s): HGBA1C in the last 72 hours. CBG:  Recent Labs Lab 12/04/15 1140 12/04/15 1619 12/04/15 2035 12/05/15 0721 12/05/15 1122  GLUCAP 165* 147* 133* 145* 150*   Lipid Profile: No results for input(s): CHOL, HDL, LDLCALC, TRIG, CHOLHDL, LDLDIRECT in the last 72 hours. Thyroid  Function Tests: No results for input(s): TSH, T4TOTAL, FREET4, T3FREE, THYROIDAB in the last 72 hours. Anemia Panel: No results for input(s): VITAMINB12, FOLATE, FERRITIN, TIBC, IRON, RETICCTPCT in the last 72 hours. Urine analysis:    Component Value Date/Time   COLORURINE AMBER* 10/29/2015 1310   APPEARANCEUR CLEAR 10/29/2015 1310   LABSPEC 1.025 10/29/2015 1310   PHURINE 6.0 10/29/2015 1310   GLUCOSEU NEGATIVE 10/29/2015 1310   HGBUR TRACE* 10/29/2015 1310   BILIRUBINUR SMALL* 10/29/2015 1310   KETONESUR NEGATIVE 10/29/2015 1310   PROTEINUR 30* 10/29/2015 1310   UROBILINOGEN 1.0 08/01/2014 1417   NITRITE NEGATIVE 10/29/2015 1310   LEUKOCYTESUR NEGATIVE 10/29/2015 1310     RAI,RIPUDEEP M.D. [facility] 12/05/2015, 12:12 PM  Pager: [phone] Between 7am to 7pm - call Pager - [phone]  After 7pm go to [email or web address] - password [email or web address]  Call night coverage person covering after 7pm

## 2015-12-05 NOTE — Care Management Important Message (Signed)
Important Message  Patient Details  Name: Tyler Le MRN: [id number] Date of Birth: 01-09-1946   Medicare Important Message Given:  Yes    Bernadette HoitShoffner, Tanazia Achee Coleman 12/05/2015, 8:44 AM

## 2015-12-05 NOTE — Discharge Instructions (Addendum)
Information on my medicine - ELIQUIS (apixaban)  This medication education was reviewed with me or my healthcare representative as part of my discharge preparation.    Why was Eliquis prescribed for you? 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  What do You need to know about Eliquis ? The starting dose is 10 mg (two 5 mg tablets) taken TWICE daily for the FIRST SEVEN (7) DAYS, then on 12/12/2015  the dose is reduced to ONE 5 mg tablet taken TWICE daily.  Eliquis may be taken with or without food.   Try to takONE 5 mg tabe the dose about the same time in the morning and in the evening. If you have difficulty swallowing the tablet whole please discuss with your pharmacist how to take the medication safely.  Take Eliquis exactly as prescribed and DO NOT stop taking Eliquis without talking to the doctor who prescribed the medication.  Stopping may increase your risk of developing a new blood clot.  Refill your prescription before you run out.  After discharge, you should have regular check-up appointments with your healthcare provider that is prescribing your Eliquis.    What do you do if you miss a dose? If a dose of ELIQUIS is not taken at the scheduled time, take it as soon as possible on the same day and twice-daily administration should be resumed. The dose should not be doubled to make up for a missed dose.  Important Safety Information A possible side effect of Eliquis is bleeding. You should call your healthcare provider right away if you experience any of the following: ? Bleeding from an injury or your nose that does not stop. ? Unusual colored urine (red or dark brown) or unusual colored stools (red or black). ? Unusual bruising for unknown reasons. ? A serious fall or if you hit your head (even if there is no  bleeding).  Some medicines may interact with Eliquis and might increase your risk of bleeding or clotting while on Eliquis. To help avoid this, consult your healthcare provider or pharmacist prior to using any new prescription or non-prescription medications, including herbals, vitamins, non-steroidal anti-inflammatory drugs (NSAIDs) and supplements.  This website has more information on Eliquis (apixaban): [email or web address]   Pulmonary Embolism A pulmonary embolism (PE) is a sudden blockage or decrease of blood flow in one lung or both lungs. Most blockages come from a blood clot that travels from the legs or the pelvis to the lungs. PE is a dangerous and potentially life-threatening condition if it is not treated right away. CAUSES A pulmonary embolism occurs most commonly when a blood clot travels from one of your veins to your lungs. Rarely, PE is caused by air, fat, amniotic fluid, or part of a tumor traveling through your veins to your lungs. RISK FACTORS A PE is more likely to develop in:  People who smoke.  People who areolder, especially over 59 years of age.  People who are overweight (obese).  People who sit or lie still for a long time, such as during long-distance travel (over 4 hours), bed rest, hospitalization, or during recovery from certain medical conditions like a stroke.  People who do not engage in much physical activity (sedentary lifestyle).  People who have chronic breathing disorders.  People whohave a personal or family history of blood clots or  blood clotting disease.  People whohave peripheral vascular disease (PVD), diabetes, or some types of cancer.  People who haveheart disease, especially if the person had a recent heart attack or has congestive heart failure.  People who have neurological diseases that affect the legs (leg paresis).  People who have had a traumatic injury, such as breaking a hip or leg.  People whohave  recently had major or lengthy surgery, especially on the hip, knee, or abdomen.  People who have hada central line placed inside a large vein.  People who takemedicines that contain the hormone estrogen. These include birth control pills and hormone replacement therapy.  Pregnancy or during childbirth or the postpartum period. SIGNS AND SYMPTOMS  The symptoms of a PE usually start suddenly and include:  Shortness of breath while active or at rest.  Coughing or coughing up blood or blood-tinged mucus.  Chest pain that is often worse with deep breaths.  Rapid or irregular heartbeat.  Feeling light-headed or dizzy.  Fainting.  Feelinganxious.  Sweating. There may also be pain and swelling in a leg if that is where the blood clot started. These symptoms may represent a serious problem that is an emergency. Do not wait to see if the symptoms will go away. Get medical help right away. Call your local emergency services (911 in the [place] Do not drive yourself to the hospital. DIAGNOSIS Your health care provider will take a medical history and perform a physical exam. You may also have other tests, including:  Blood tests to assess the clotting properties of your blood, assess oxygen levels in your blood, and find blood clots.  Imaging tests, such as CT, ultrasound, MRI, X-ray, and other tests to see if you have clots anywhere in your body.  An electrocardiogram (ECG) to look for heart strain from blood clots in the lungs. TREATMENT The main goals of PE treatment are:  To stop a blood clot from growing larger.  To stop new blood clots from forming. The type of treatment that you receive depends on many factors, such as the cause of your PE, your risk for bleeding or developing more clots, and other medical conditions that you have. Sometimes, a combination of treatments is necessary. This condition may be treated with:  Medicines, including newer oral blood thinners  (anticoagulants), warfarin, low molecular weight heparins, thrombolytics, or heparins.  Wearing compression stockings or using different types of devices.  Surgery (rare) to remove the blood clot or to place a filter in your abdomen to stop the blood clot from traveling to your lungs. Treatments for a PE are often divided into immediate treatment, long-term treatment (up to 3 months after PE), and extended treatment (more than 3 months after PE). Your treatment may continue for several months. This is called maintenance therapy, and it is used to prevent the forming of new blood clots. You can work with your health care provider to choose the treatment program that is best for you. What are anticoagulants? Anticoagulants are medicines that treat PEs. They can stop current blood clots from growing and stop new clots from forming. They cannot dissolve existing clots. Your body dissolves clots by itself over time. Anticoagulants are given by mouth, by injection, or through an IV tube. What are thrombolytics? Thrombolytics are clot-dissolving medicines that are used to dissolve a PE. They carry a high risk of bleeding, so they tend to be used only in severe cases or if you have very low blood pressure. HOME CARE INSTRUCTIONS  If you are taking a newer oral anticoagulant:  Take the medicine every single day at the same time each day.  Understand what foods and drugs interact with this medicine.  Understand that there are no regular blood tests required when using this medicine.  Understandthe side effects of this medicine, including excessive bruising or bleeding. Ask your health care provider or pharmacist about other possible side effects. If you are taking warfarin:  Understand how to take warfarin and know which foods can affect how warfarin works in [employer].  Understand that it is dangerous to taketoo much or too little warfarin. Too much warfarin increases the risk of bleeding. Too  little warfarin continues to allow the risk for blood clots.  Follow your PT and INR blood testing schedule. The PT and INR results allow your health care provider to adjust your dose of warfarin. It is very important that you have your PT and INR tested as often as told by your health care provider.  Avoid major changes in your diet, or tell your health care provider before you change your diet. Arrange a visit with a registered dietitian to answer your questions. Many foods, especially foods that are high in vitamin K, can interfere with warfarin and affect the PT and INR results. Eat a consistent amount of foods that are high in vitamin K, such as:  Spinach, kale, broccoli, cabbage, collard greens, turnip greens, Brussels sprouts, peas, cauliflower, seaweed, and parsley.  Beef liver and pork liver.  Green tea.  Soybean oil.  Tell your health care provider about any and all medicines, vitamins, and supplements that you take, including aspirin and other over-the-counter anti-inflammatory medicines. Be especially cautious with aspirin and anti-inflammatory medicines. Do not take those before you ask your health care provider if it is safe to do so. This is important because many medicines can interfere with warfarin and affect the PT and INR results.  Do not start or stop taking any over-the-counter or prescription medicine unless your health care provider or pharmacist tells you to do so. If you take warfarin, you will also need to do these things:  Hold pressure over cuts for longer than usual.  Tell your dentist and other health care providers that you are taking warfarin before you have any procedures in which bleeding may occur.  Avoid alcohol or drink very small amounts. Tell your health care provider if you change your alcohol intake.  Do not use tobacco products, including cigarettes, chewing tobacco, and e-cigarettes. If you need help quitting, ask your health care  provider.  Avoid contact sports. General Instructions  Take over-the-counter and prescription medicines only as told by your health care provider. Anticoagulant medicines can have side effects, including easy bruising and difficulty stopping bleeding. If you are prescribed an anticoagulant, you will also need to do these things:  Hold pressure over cuts for longer than usual.  Tell your dentist and other health care providers that you are taking anticoagulants before you have any procedures in which bleeding may occur.  Avoid contact sports.  Wear a medical alert bracelet or carry a medical alert card that says you have had a PE.  Ask your health care provider how soon you can go back to your normal activities. Stay active to prevent new blood clots from forming.  Make sure to exercise while traveling or when you have been sitting or standing for a long period of time. It is very important to exercise. Exercise your legs by  walking or by tightening and relaxing your leg muscles often. Take frequent walks.  Wear compression stockings as told by your health care provider to help prevent more blood clots from forming.  Do not use tobacco products, including cigarettes, chewing tobacco, and e-cigarettes. If you need help quitting, ask your health care provider.  Keep all follow-up appointments with your health care provider. This is important. PREVENTION Take these actions to decrease your risk of developing another PE:  Exercise regularly. For at least 30 minutes every day, engage in:  Activity that involves moving your arms and legs.  Activity that encourages good blood flow through your body by increasing your heart rate.  Exercise your arms and legs every hour during long-distance travel (over 4 hours). Drink plenty of water and avoid drinking alcohol while traveling.  Avoid sitting or lying in bed for long periods of time without moving your legs.  Maintain a weight that is  appropriate for your height. Ask your health care provider what weight is healthy for you.  If you are a woman who is over 65 years of age, avoid unnecessary use of medicines that contain estrogen. These include birth control pills.  Do not smoke, especially if you take estrogen medicines. If you need help quitting, ask your health care provider.  If you are at very high risk for PE, wear compression stockings.  If you recently had a PE, have regularly scheduled ultrasound testing on your legs to check for new blood clots. If you are hospitalized, prevention measures may include:  Early walking after surgery, as soon as your health care provider says that it is safe.  Receiving anticoagulants to prevent blood clots. If you cannot take anticoagulants, other options may be available, such as wearing compression stockings or using different types of devices. SEEK IMMEDIATE MEDICAL CARE IF:  You have new or increased pain, swelling, or redness in an arm or leg.  You have numbness or tingling in an arm or leg.  You have shortness of breath while active or at rest.  You have chest pain.  You have a rapid or irregular heartbeat.  You feel light-headed or dizzy.  You cough up blood.  You notice blood in your vomit, bowel movement, or urine.  You have a fever. These symptoms may represent a serious problem that is an emergency. Do not wait to see if the symptoms will go away. Get medical help right away. Call your local emergency services (911 in the [place] Do not drive yourself to the hospital.   This information is not intended to replace advice given to you by your health care provider. Make sure you discuss any questions you have with your health care provider.   Document Released: 06/07/2000 Document Revised: 03/01/2015 Document Reviewed: 10/05/2014 Elsevier Interactive Patient Education 2016 [employer]  Heart-Healthy Eating Plan Many factors influence your heart health,  including eating and exercise habits. Heart (coronary) risk increases with abnormal blood fat (lipid) levels. Heart-healthy meal planning includes limiting unhealthy fats, increasing healthy fats, and making other small dietary changes. This includes maintaining a healthy body weight to help keep lipid levels within a normal range. WHAT IS MY PLAN?  Your health care provider recommends that you:  Get no more than _________% of the total calories in your daily diet from fat.  Limit your intake of saturated fat to less than _________% of your total calories each day.  Limit the amount of cholesterol in your diet to less than _________  mg per day. WHAT TYPES OF FAT SHOULD I CHOOSE?  Choose healthy fats more often. Choose monounsaturated and polyunsaturated fats, such as olive oil and canola oil, flaxseeds, walnuts, almonds, and seeds.  Eat more omega-3 fats. Good choices include salmon, mackerel, sardines, tuna, flaxseed oil, and ground flaxseeds. Aim to eat fish at least two times each week.  Limit saturated fats. Saturated fats are primarily found in animal products, such as meats, butter, and cream. Plant sources of saturated fats include palm oil, palm kernel oil, and coconut oil.  Avoid foods with partially hydrogenated oils in them. These contain trans fats. Examples of foods that contain trans fats are stick margarine, some tub margarines, cookies, crackers, and other baked goods. WHAT GENERAL GUIDELINES DO I NEED TO FOLLOW?  Check food labels carefully to identify foods with trans fats or high amounts of saturated fat.  Fill one half of your plate with vegetables and green salads. Eat 4-5 servings of vegetables per day. A serving of vegetables equals 1 cup of raw leafy vegetables,  cup of raw or cooked cut-up vegetables, or  cup of vegetable juice.  Fill one fourth of your plate with whole grains. Look for the word "whole" as the first word in the ingredient list.  Fill one fourth of  your plate with lean protein foods.  Eat 4-5 servings of fruit per day. A serving of fruit equals one medium whole fruit,  cup of dried fruit,  cup of fresh, frozen, or canned fruit, or  cup of 100% fruit juice.  Eat more foods that contain soluble fiber. Examples of foods that contain this type of fiber are apples, broccoli, carrots, beans, peas, and barley. Aim to get 20-30 g of fiber per day.  Eat more home-cooked food and less restaurant, buffet, and fast food.  Limit or avoid alcohol.  Limit foods that are high in starch and sugar.  Avoid fried foods.  Cook foods by using methods other than frying. Baking, boiling, grilling, and broiling are all great options. Other fat-reducing suggestions include:  Removing the skin from poultry.  Removing all visible fats from meats.  Skimming the fat off of stews, soups, and gravies before serving them.  Steaming vegetables in water or broth.  Lose weight if you are overweight. Losing just 5-10% of your initial body weight can help your overall health and prevent diseases such as diabetes and heart disease.  Increase your consumption of nuts, legumes, and seeds to 4-5 servings per week. One serving of dried beans or legumes equals  cup after being cooked, one serving of nuts equals 1 ounces, and one serving of seeds equals  ounce or 1 tablespoon.  You may need to monitor your salt (sodium) intake, especially if you have high blood pressure. Talk with your health care provider or dietitian to get more information about reducing sodium. WHAT FOODS CAN I EAT? Grains Breads, including [place], white, pita, wheat, raisin, rye, oatmeal, and [place]. Tortillas that are neither fried nor made with lard or trans fat. Low-fat rolls, including hotdog and hamburger buns and English muffins. Biscuits. Muffins. Waffles. Pancakes. Light popcorn. Whole-grain cereals. Flatbread. Melba toast. Pretzels. Breadsticks. Rusks. Low-fat snacks and crackers,  including oyster, saltine, matzo, graham, animal, and rye. Rice and pasta, including brown rice and those that are made with whole wheat. Vegetables All vegetables. Fruits All fruits, but limit coconut. Meats and Other Protein Sources Lean, well-trimmed beef, veal, pork, and lamb. Chicken and [place] without skin. All fish and  shellfish. Wild duck, rabbit, pheasant, and venison. Egg whites or low-cholesterol egg substitutes. Dried beans, peas, lentils, and tofu.Seeds and most nuts. Dairy Low-fat or nonfat cheeses, including ricotta, string, and mozzarella. Skim or 1% milk that is liquid, powdered, or evaporated. Buttermilk that is made with low-fat milk. Nonfat or low-fat yogurt. Beverages Mineral water. Diet carbonated beverages. Sweets and Desserts Sherbets and fruit ices. Honey, jam, marmalade, jelly, and syrups. Meringues and gelatins. Pure sugar candy, such as hard candy, jelly beans, gumdrops, mints, marshmallows, and small amounts of dark chocolate. [employer]. Eat all sweets and desserts in moderation. Fats and Oils Nonhydrogenated (trans-free) margarines. Vegetable oils, including soybean, sesame, sunflower, olive, peanut, safflower, corn, canola, and cottonseed. Salad dressings or mayonnaise that are made with a vegetable oil. Limit added fats and oils that you use for cooking, baking, salads, and as spreads. Other Cocoa powder. Coffee and tea. All seasonings and condiments. The items listed above may not be a complete list of recommended foods or beverages. Contact your dietitian for more options. WHAT FOODS ARE NOT RECOMMENDED? Grains Breads that are made with saturated or trans fats, oils, or whole milk. Croissants. Butter rolls. Cheese breads. Sweet rolls. Donuts. Buttered popcorn. Chow mein noodles. High-fat crackers, such as cheese or butter crackers. Meats and Other Protein Sources Fatty meats, such as hotdogs, short ribs, sausage, spareribs, bacon, ribeye roast or steak,  and mutton. High-fat deli meats, such as salami and bologna. Caviar. Domestic duck and goose. Organ meats, such as kidney, liver, sweetbreads, brains, gizzard, chitterlings, and heart. Dairy Cream, sour cream, cream cheese, and creamed cottage cheese. Whole milk cheeses, including blue (bleu), [address], [place], [place], [address], [place], [address], [place], [place], and [place]. Whole or 2% milk that is liquid, evaporated, or condensed. Whole buttermilk. Cream sauce or high-fat cheese sauce. Yogurt that is made from whole milk. Beverages Regular sodas and drinks with added sugar. Sweets and Desserts Frosting. Pudding. Cookies. Cakes other than angel food cake. Candy that has milk chocolate or white chocolate, hydrogenated fat, butter, coconut, or unknown ingredients. Buttered syrups. Full-fat ice cream or ice cream drinks. Fats and Oils Gravy that has suet, meat fat, or shortening. Cocoa butter, hydrogenated oils, palm oil, coconut oil, palm kernel oil. These can often be found in baked products, candy, fried foods, nondairy creamers, and whipped toppings. Solid fats and shortenings, including bacon fat, salt pork, lard, and butter. Nondairy cream substitutes, such as coffee creamers and sour cream substitutes. Salad dressings that are made of unknown oils, cheese, or sour cream. The items listed above may not be a complete list of foods and beverages to avoid. Contact your dietitian for more information.   This information is not intended to replace advice given to you by your health care provider. Make sure you discuss any questions you have with your health care provider.   Document Released: 03/19/2008 Document Revised: 07/01/2014 Document Reviewed: 12/02/2013 Elsevier Interactive Patient Education 2016 [employer].   Diabetes Mellitus and Food It is important for you to manage your blood sugar (glucose) level. Your blood glucose level can be greatly affected by what you eat. Eating  healthier foods in the appropriate amounts throughout the day at about the same time each day will help you control your blood glucose level. It can also help slow or prevent worsening of your diabetes mellitus. Healthy eating may even help you improve the level of your blood pressure and reach or maintain a healthy weight.  General recommendations for healthful eating  and cooking habits include:  Eating meals and snacks regularly. Avoid going long periods of time without eating to lose weight.  Eating a diet that consists mainly of plant-based foods, such as fruits, vegetables, nuts, legumes, and whole grains.  Using low-heat cooking methods, such as baking, instead of high-heat cooking methods, such as deep frying. Work with your dietitian to make sure you understand how to use the Nutrition Facts information on food labels. HOW CAN FOOD AFFECT ME? Carbohydrates Carbohydrates affect your blood glucose level more than any other type of food. Your dietitian will help you determine how many carbohydrates to eat at each meal and teach you how to count carbohydrates. Counting carbohydrates is important to keep your blood glucose at a healthy level, especially if you are using insulin or taking certain medicines for diabetes mellitus. Alcohol Alcohol can cause sudden decreases in blood glucose (hypoglycemia), especially if you use insulin or take certain medicines for diabetes mellitus. Hypoglycemia can be a life-threatening condition. Symptoms of hypoglycemia (sleepiness, dizziness, and disorientation) are similar to symptoms of having too much alcohol.  If your health care provider has given you approval to drink alcohol, do so in moderation and use the following guidelines:  Women should not have more than one drink per day, and men should not have more than two drinks per day. One drink is equal to:  12 oz of beer.  5 oz of wine.  1 oz of hard liquor.  Do not drink on an empty  stomach.  Keep yourself hydrated. Have water, diet soda, or unsweetened iced tea.  Regular soda, juice, and other mixers might contain a lot of carbohydrates and should be counted. WHAT FOODS ARE NOT RECOMMENDED? As you make food choices, it is important to remember that all foods are not the same. Some foods have fewer nutrients per serving than other foods, even though they might have the same number of calories or carbohydrates. It is difficult to get your body what it needs when you eat foods with fewer nutrients. Examples of foods that you should avoid that are high in calories and carbohydrates but low in nutrients include:  Trans fats (most processed foods list trans fats on the Nutrition Facts label).  Regular soda.  Juice.  Candy.  Sweets, such as cake, pie, doughnuts, and cookies.  Fried foods. WHAT FOODS CAN I EAT? Eat nutrient-rich foods, which will nourish your body and keep you healthy. The food you should eat also will depend on several factors, including:  The calories you need.  The medicines you take.  Your weight.  Your blood glucose level.  Your blood pressure level.  Your cholesterol level. You should eat a variety of foods, including:  Protein.  Lean cuts of meat.  Proteins low in saturated fats, such as fish, egg whites, and beans. Avoid processed meats.  Fruits and vegetables.  Fruits and vegetables that may help control blood glucose levels, such as apples, mangoes, and yams.  Dairy products.  Choose fat-free or low-fat dairy products, such as milk, yogurt, and cheese.  Grains, bread, pasta, and rice.  Choose whole grain products, such as multigrain bread, whole oats, and brown rice. These foods may help control blood pressure.  Fats.  Foods containing healthful fats, such as nuts, avocado, olive oil, canola oil, and fish. DOES EVERYONE WITH DIABETES MELLITUS HAVE THE SAME MEAL PLAN? Because every person with diabetes mellitus is  different, there is not one meal plan that works for everyone. It  is very important that you meet with a dietitian who will help you create a meal plan that is just right for you.   This information is not intended to replace advice given to you by your health care provider. Make sure you discuss any questions you have with your health care provider.   Document Released: 03/07/2005 Document Revised: 07/01/2014 Document Reviewed: 05/07/2013 Elsevier Interactive Patient Education [employer]

## 2015-12-06 ENCOUNTER — Other Ambulatory Visit: Payer: Self-pay | Admitting: Cardiothoracic Surgery

## 2015-12-06 DIAGNOSIS — Z951 Presence of aortocoronary bypass graft: Secondary | ICD-10-CM

## 2015-12-06 LAB — CBC
HCT: 32.8 % — ABNORMAL LOW (ref 39.0–52.0)
Hemoglobin: 9.7 g/dL — ABNORMAL LOW (ref 13.0–17.0)
MCH: 27.1 pg (ref 26.0–34.0)
MCHC: 29.6 g/dL — ABNORMAL LOW (ref 30.0–36.0)
MCV: 91.6 fL (ref 78.0–100.0)
PLATELETS: 151 10*3/uL (ref 150–400)
RBC: 3.58 MIL/uL — AB (ref 4.22–5.81)
RDW: 15.5 % (ref 11.5–15.5)
WBC: 4.4 10*3/uL (ref 4.0–10.5)

## 2015-12-06 LAB — BASIC METABOLIC PANEL
ANION GAP: 6 (ref 5–15)
BUN: 15 mg/dL (ref 6–20)
CALCIUM: 8.8 mg/dL — AB (ref 8.9–10.3)
CO2: 29 mmol/L (ref 22–32)
Chloride: 104 mmol/L (ref 101–111)
Creatinine, Ser: 1.23 mg/dL (ref 0.61–1.24)
GFR calc Af Amer: >60 mL/min (ref >60)
GFR calc non Af Amer: 57 mL/min — ABNORMAL LOW (ref >60)
GLUCOSE: 156 mg/dL — AB (ref 65–99)
Potassium: 3.8 mmol/L (ref 3.5–5.1)
Sodium: 139 mmol/L (ref 135–145)

## 2015-12-06 LAB — GLUCOSE, CAPILLARY
GLUCOSE-CAPILLARY: 132 mg/dL — AB (ref 65–99)
Glucose-Capillary: 159 mg/dL — ABNORMAL HIGH (ref 65–99)

## 2015-12-06 MED ORDER — APIXABAN 5 MG PO TABS: 5.0000 mg | ORAL_TABLET | Freq: Two times a day (BID) | ORAL | Status: DC

## 2015-12-06 MED ORDER — CLOPIDOGREL BISULFATE 75 MG PO TABS
75.0000 mg | ORAL_TABLET | Freq: Every day | ORAL | Status: DC
  Administered 2015-12-06: 75 mg via ORAL
  Filled 2015-12-06: qty 1

## 2015-12-06 MED ORDER — FUROSEMIDE 40 MG PO TABS: 40.0000 mg | ORAL_TABLET | Freq: Every day | ORAL | Status: DC

## 2015-12-06 MED ORDER — FUROSEMIDE 40 MG PO TABS: 60.0000 mg | ORAL_TABLET | Freq: Every day | ORAL | Status: DC

## 2015-12-06 MED ORDER — APIXABAN 5 MG PO TABS
10.0000 mg | ORAL_TABLET | Freq: Two times a day (BID) | ORAL | Status: DC
Start: 2015-12-06 — End: 2015-12-06

## 2015-12-06 MED ORDER — HYDROCORTISONE ACETATE 25 MG RE SUPP: 25.0000 mg | Freq: Two times a day (BID) | RECTAL | Status: DC

## 2015-12-06 MED ORDER — WITCH HAZEL-GLYCERIN EX PADS: MEDICATED_PAD | Freq: Three times a day (TID) | CUTANEOUS | Status: DC

## 2015-12-06 MED ORDER — APIXABAN 5 MG PO TABS: ORAL_TABLET | ORAL | Status: DC

## 2015-12-06 MED ORDER — FUROSEMIDE 40 MG PO TABS
40.0000 mg | ORAL_TABLET | Freq: Every day | ORAL | Status: DC
  Administered 2015-12-06: 40 mg via ORAL
  Filled 2015-12-06: qty 1

## 2015-12-06 MED ORDER — ASPIRIN 81 MG PO CHEW: 81.0000 mg | CHEWABLE_TABLET | Freq: Every day | ORAL | Status: DC

## 2015-12-06 NOTE — Discharge Summary (Addendum)
Physician Discharge Summary   Patient ID: JMAR KOPKE MRN: [id number] DOB/AGE: 1946/04/23 70 y.o.  Admit date: 12/02/2015 Discharge date: 12/06/2015  Primary Care Physician:  Kaleen Mask, MD  Discharge Diagnoses:    . Acute hypoxic respiratory failure  . Saddle Pulmonary embolism (HCC) . Essential hypertension   Right LE DVT  CAD S/P RECENT CABG   chronic diastolic CHF  Consults:  Pulmonology [facility], Dr Donata Clay  Recommendations for Outpatient Follow-up:  1. Patient started on ELIQUIS 10 mg twice a day till 12/11/15, thereafter patient will start eliquis 5mg  BID 2. Please note I have called patient's insurance and had done the preauthorization and it is approved till 06/06/2016. Preauthorization number is [id number] 3. I have also given him separate prescriptions for Eliquis for first 30days (free card) and subsequent 5mg  BID with 5 refills 4. Per [facility]. Patient will continue Plavix 75 mg daily due to recent redo CABG and no aspirin 5. Patient qualified for home oxygen 4L, has underlying chronic diastolic CHF, recent CABG, new PE   DIET: Carb modified diet    Allergies:   Allergies  Allergen Reactions  . Metformin And Related Nausea And Vomiting  . Pravachol Nausea And Vomiting  . Ramipril Nausea Only  . Wellbutrin [Bupropion Hcl] Nausea And Vomiting     DISCHARGE MEDICATIONS: Current Discharge Medication List    START taking these medications   Details  apixaban (ELIQUIS) 5 MG TABS tablet Take 10 mg (2 TABS ) TWICE A DAY TILL 12/11/15. On 12/12/15, start 5mg  (1tab) twice a day thereafter   30day supply Qty: 49 tablet, Refills: 0    hydrocortisone (ANUSOL-HC) 25 MG suppository Place 1 suppository (25 mg total) rectally 2 (two) times daily. Qty: 60 suppository, Refills: 0    witch hazel-glycerin (TUCKS) pad Apply topically 3 (three) times daily. For hemorrhoids, available over the counter Qty: 40 each, Refills: 12      CONTINUE these medications  which have CHANGED   Details  furosemide (LASIX) 40 MG tablet Take 1 tablet (40 mg total) by mouth daily. Qty: 30 tablet, Refills: 3      CONTINUE these medications which have NOT CHANGED   Details  amLODipine (NORVASC) 5 MG tablet Take 1 tablet (5 mg total) by mouth daily. Qty: 90 tablet, Refills: 3    Artificial Tear Ointment (DRY EYES OP) Place 1 drop into both eyes daily as needed (for dry eyes).    busPIRone (BUSPAR) 15 MG tablet Take 15 mg by mouth daily.    carvedilol (COREG) 25 MG tablet Take 0.5 tablets (12.5 mg total) by mouth 2 (two) times daily with a meal. Qty: 60 tablet, Refills: 6    Cholecalciferol (VITAMIN D3) 2000 UNITS TABS Take 4,000 Units by mouth at bedtime.     clopidogrel (PLAVIX) 75 MG tablet Take 1 tablet (75 mg total) by mouth daily.    CYMBALTA 60 MG capsule Take 60 mg by mouth at bedtime.     ezetimibe (ZETIA) 10 MG tablet Take 1 tablet (10 mg total) by mouth daily.    HYDROcodone-acetaminophen (NORCO) 10-325 MG tablet Take 1 tablet by mouth every 6 (six) hours as needed for moderate pain. Qty: 30 tablet, Refills: 0    insulin glargine (LANTUS) 100 UNIT/ML injection Inject 64 Units into the skin at bedtime.     isosorbide mononitrate (IMDUR) 60 MG 24 hr tablet Take 60 mg by mouth at bedtime.    LINZESS 145 MCG CAPS capsule Take 145 mcg by mouth  every other day.     mirtazapine (REMERON) 15 MG tablet Take 15 mg by mouth at bedtime.     nitroGLYCERIN (NITROSTAT) 0.4 MG SL tablet Place 1 tablet (0.4 mg total) under the tongue every 5 (five) minutes as needed for chest pain (up to 3 doses). Qty: 100 tablet, Refills: 4   Associated Diagnoses: Chest pain, unspecified chest pain type    NOVOLOG FLEXPEN 100 UNIT/ML FlexPen Inject 24 Units into the skin 2 (two) times daily.     pantoprazole (PROTONIX) 40 MG tablet Take 1 tablet (40 mg total) by mouth daily.    potassium chloride SA (K-DUR,KLOR-CON) 20 MEQ tablet Take 1 tablet (20 mEq total) by mouth  daily.    QUEtiapine (SEROQUEL) 400 MG tablet Take 400 mg by mouth at bedtime.      STOP taking these medications     aspirin EC 325 MG EC tablet          Brief H and P: For complete details please refer to admission H and P, but in brief Patient is a 70 year old male with CAD status post CABG in 4/ 2017, peripheral vascular disease status post endarterectomy 4/17 who was recently discharged home after a prolonged hospitalization in April presented with increased dyspnea on exertion and shortness of breath for the last 2 days. He also noticed swelling in the lower extremities in the last few days. CT angiogram of the chest showed large saddle pulmonary embolism. Patient was admitted for further workup.  Hospital Course:  Acute hypoxic respiratory failure with saddle Pulmonary embolism (HCC) -Asian was placed on IV heparin drip and subsequently transitioned to NOAC (eliquis, per patient's preference and [facility] recommendation) - Pulmonology and [facility] were consulted. High risk for major bleeding with TPA given recent surgeries, so no TPA -Home O2 ventilation was done and patient will need 4 L O2, wean as tolerated - 2-D echo showed EF of 55-60% with grade 2 diastolic dysfunction, right ventricle systolic function mildly reduced, PA pressure moderately increased  Right leg DVT -Doppler ultrasound of the lower extremities positive for Acute nonocclusive DVT in the right common femoral vein and SVT at saphenofemoral junction - On anticoagulation   S/P CABG (coronary artery bypass graft), CAD, chronic diastolic CHF  - Currently stable, continue amlodipine, aspirin, Coreg, Plavix, Zetia, Lasix - Mild elevation in the troponins likely due to large saddle emboli - 2-D echo shows preserved EF 55% but grade 2 diastolic dysfunction, mildly reduced right ventricular systolic function    Type II diabetes mellitus (HCC) - Continue sliding scale and Lantus   Essential hypertension -  Currently stable, continue Coreg, Lasix  CKD stage III - Creatinine stable  Day of Discharge BP 123/69 mmHg  Pulse 99  Temp(Src) 97 F (36.1 C) (Oral)  Resp 23  Ht 6\' 3"  (1.905 m)  Wt 127.5 kg (281 lb 1.4 oz)  BMI 35.13 kg/m2  SpO2 95%  Physical Exam: General: Alert and awake oriented x3 not in any acute distress. HEENT: anicteric sclera, pupils reactive to light and accommodation CVS: S1-S2 clear no murmur rubs or gallops Chest: clear to auscultation bilaterally, no wheezing rales or rhonchi Abdomen: soft nontender, nondistended, normal bowel sounds Extremities: no cyanosis, clubbing or edema noted bilaterally Neuro: Cranial nerves II-XII intact, no focal neurological deficits   The results of significant diagnostics from this hospitalization (including imaging, microbiology, ancillary and laboratory) are listed below for reference.    LAB RESULTS: Basic Metabolic Panel:  Recent Labs Lab 12/05/15  1660 12/06/15 0448  NA 139 139  K 3.8 3.8  CL 102 104  CO2 29 29  GLUCOSE 156* 156*  BUN 16 15  CREATININE 1.23 1.23  CALCIUM 8.8* 8.8*   Liver Function Tests: No results for input(s): AST, ALT, ALKPHOS, BILITOT, PROT, ALBUMIN in the last 168 hours. No results for input(s): LIPASE, AMYLASE in the last 168 hours. No results for input(s): AMMONIA in the last 168 hours. CBC:  Recent Labs Lab 12/05/15 0426 12/06/15 0448  WBC 5.0 4.4  HGB 9.7* 9.7*  HCT 32.0* 32.8*  MCV 91.4 91.6  PLT 149* 151   Cardiac Enzymes:  Recent Labs Lab 12/03/15 0413 12/03/15 1148  TROPONINI 0.06* 0.06*   BNP: Invalid input(s): POCBNP CBG:  Recent Labs Lab 12/05/15 2050 12/06/15 0730  GLUCAP 212* 132*    Significant Diagnostic Studies:  Dg Chest 2 View  12/02/2015  CLINICAL DATA:  Severe shortness of breath when walking at home today. Recent CABG. EXAM: CHEST  2 VIEW COMPARISON:  11/08/2015. FINDINGS: Stable enlarged cardiac silhouette and post CABG changes. The  tracheostomy tube has been removed. Small amount of linear density at both lung bases. Stable small left pleural effusion. Thoracic spine degenerative changes. IMPRESSION: 1. Interval small amount of linear atelectasis or scarring at both lung bases. 2. Stable cardiomegaly. 3. Stable small left pleural effusion. Electronically Signed   By: Beckie Salts M.D.   On: 12/02/2015 19:01   Ct Angio Chest Pe W/cm &/or Wo Cm  12/02/2015  CLINICAL DATA:  70 year old male with history of recent open heart surgery presenting with shortness of breath EXAM: CT ANGIOGRAPHY CHEST WITH CONTRAST TECHNIQUE: Multidetector CT imaging of the chest was performed using the standard protocol during bolus administration of intravenous contrast. Multiplanar CT image reconstructions and MIPs were obtained to evaluate the vascular anatomy. CONTRAST:  100 cc Isovue 370 COMPARISON:  Chest radiograph dated 12/02/2015 FINDINGS: The lungs are clear. There is a small left pleural effusion. No pneumothorax. The central airways are patent. There is mild moderate atherosclerotic calcification of the thoracic aorta. There is mild dilatation of the superior aspect of the descending thoracic aorta measuring up to 3.6 cm in diameter. The origins of the great vessels of the aortic arch appear patent. There is a large saddle embolus straddling the bifurcation of the main pulmonary trunk and extending to the central pulmonary arteries bilaterally. There is mild enlargement of the right ventricle and right atrium with flattening of the interventricular septum compatible with a degree of right cardiac straining. Correlation with clinical exam and echocardiogram recommended. There is postsurgical changes of CABG with multiple vascular clips. There is a 2.5 x 2.7 cm loculated high attenuating fluid superior to the pericardium on the left likely a focal seroma or hematoma. There is no hilar or mediastinal adenopathy. The esophagus is grossly unremarkable. There  is an ill-defined 2 cm right thyroid nodule with calcified focus. There is no axillary adenopathy. Median sternotomy wires and midline anterior chest wall surgical incision. No hematoma. There is degenerative change of of the spine. No acute fracture. There is retrograde flow of contrast from the right atrium into the IVC compatible with a degree of right cardiac dysfunction. Review of the MIP images confirms the above findings. IMPRESSION: Large saddle embolus with findings of right cardiac straining. Postsurgical changes of CABG. Small loculated fluid superior to the pericardium on the left may represent small hematoma or seroma. These results were called by telephone at the time of interpretation  on 12/02/2015 at 9:23 pm to Dr. Glynn Octave , who verbally acknowledged these results. Electronically Signed   By: Elgie Collard M.D.   On: 12/02/2015 21:31    2D ECHO: Study Conclusions  - Left ventricle: The cavity size was normal. Wall thickness was  increased in a pattern of moderate LVH. Systolic function was  normal. The estimated ejection fraction was in the range of 55%  to 60%. Wall motion was normal; there were no regional wall  motion abnormalities. Features are consistent with a pseudonormal  left ventricular filling pattern, with concomitant abnormal  relaxation and increased filling pressure (grade 2 diastolic  dysfunction). - Aortic valve: Trileaflet; moderately thickened, moderately  calcified leaflets. - Aorta: Aortic root dimension: 38 mm (ED). - Ascending aorta: The ascending aorta was mildly dilated. - Right ventricle: The cavity size was mildly dilated. Wall  thickness was normal. Systolic function was mildly reduced. - Right atrium: The atrium was mildly dilated. - Tricuspid valve: There was moderate regurgitation. - Pulmonary arteries: Systolic pressure was moderately increased.  PA peak pressure: 52 mm Hg (S).   Disposition and Follow-up: Discharge  Instructions    Diet Carb Modified    Complete by:  As directed      Increase activity slowly    Complete by:  As directed             DISPOSITION:home    DISCHARGE FOLLOW-UP Follow-up Information    Follow up with Kaleen Mask, MD. Go on 12/13/2015.   Specialty:  Family Medicine   Why:  Follow up @ 10 am   Contact information:   [address] [place] [address] [phone]       Follow up with Ronny Flurry, MD. Schedule an appointment as soon as possible for a visit in 2 weeks.   Specialty:  Cardiology   Why:  for hospital follow-up      Follow up with Mikey Bussing, MD On 12/11/2015.   Specialty:  Cardiothoracic Surgery   Why:  at 2:00PM , for hospital follow-up   Contact information:   [address] [employer] [address] [place] [address] [phone]        Time spent on Discharge:   Signed:   Patrik Turnbaugh M.D. [facility] 12/06/2015, 10:13 AM Pager: [phone]

## 2015-12-06 NOTE — Evaluation (Signed)
Physical Therapy Evaluation & discharge Patient Details Name: Tyler Le MRN: [id number] DOB: 1946/02/14 Today's Date: 12/06/2015   History of Present Illness  Patient is a 70 year old male with CAD status post CABG in 4/ 2017, peripheral vascular disease status post endarterectomy 4/17 who was recently discharged home after a prolonged hospitalization in April presented with increased dyspnea on exertion and shortness of breath for the last 2 days. He also noticed swelling in the lower extremities in the last few days. CT angiogram of the chest showed large saddle pulmonary embolism.  Clinical Impression  Pt admitted with above diagnosis. Pt scheduled to d/c today.  He has all DME he needs.  o2 90% with 160' gait with RW on 4L/min.  Recommend HHPT and wife to be with him initially with ambulation which she verbalized understanding.  Pt declined stair training stating they are only 2 small steps and he had no trouble with them when he went home after rehab.  Will d/c from PT as pt has d/c orders.  If pt does not d/c, please re-order PT services.    Follow Up Recommendations Home health PT;Supervision for mobility/OOB    Equipment Recommendations  None recommended by PT    Recommendations for Other Services       Precautions / Restrictions Precautions Precautions: Fall Restrictions Weight Bearing Restrictions: No      Mobility  Bed Mobility   Bed Mobility: Rolling;Sidelying to Sit;Sit to Sidelying Rolling: Supervision Sidelying to sit: Supervision Supine to sit: Supervision     General bed mobility comments: Pt initially got up with heavy use of rail.  Had pt lay back down and was able to perform without rail or A  Transfers Overall transfer level: Needs assistance Equipment used: Rolling walker (2 wheeled) Transfers: Sit to/from Stand Sit to Stand: Min guard         General transfer comment: pulling on RW despite cues to not pull on  it  Ambulation/Gait Ambulation/Gait assistance: Min guard Ambulation Distance (Feet): 160 Feet (1 standing break) Assistive device: Rolling walker (2 wheeled) Gait Pattern/deviations: Step-through pattern;Trunk flexed Gait velocity: decreased   General Gait Details: Amb on 4 L/min and o2 91% mid gait and 90% at end of gait.  Educated on importance of wearing o2 at all times until he hears otherwise from his MD.  Stairs            Wheelchair Mobility    Modified Rankin (Stroke Patients Only)       Balance Overall balance assessment: Needs assistance Sitting-balance support: Feet supported;No upper extremity supported Sitting balance-[name] Scale: Good       Standing balance-[name] Scale: Fair Standing balance comment: Needs RW for gait                             Pertinent Vitals/Pain Pain Assessment: Faces Faces Pain Scale: Hurts a little bit Pain Location: back Pain Descriptors / Indicators: Aching Pain Intervention(s): Limited activity within patient's tolerance;Monitored during session;Repositioned    Home Living Family/patient expects to be discharged to:: Private residence Living Arrangements: Spouse/significant other Available Help at Discharge: Family;Available 24 hours/day Type of Home: House Home Access: Stairs to enter Entrance Stairs-Rails: None (post) Entrance Stairs-Number of Steps: 2 small ones Home Layout: One level Home Equipment: Walker - 2 wheels;Shower seat - built in;Bedside commode Additional Comments: going home with home o2    Prior Function Level of Independence: Needs assistance   Gait /  Transfers Assistance Needed: recently d/c from rehab and was ambulating with RW           Hand Dominance   Dominant Hand: Right    Extremity/Trunk Assessment   Upper Extremity Assessment: Generalized weakness           Lower Extremity Assessment: Generalized weakness      Cervical / Trunk Assessment: Normal   Communication   Communication: No difficulties  Cognition Arousal/Alertness: Awake/alert Behavior During Therapy: WFL for tasks assessed/performed Overall Cognitive Status: Within Functional Limits for tasks assessed Area of Impairment: Following commands       Following Commands: Follows one step commands inconsistently            General Comments General comments (skin integrity, edema, etc.): Wife present and supportive    Exercises        Assessment/Plan    PT Assessment All further PT needs can be met in the next venue of care  PT Diagnosis Difficulty walking;Generalized weakness   PT Problem List    PT Treatment Interventions     PT Goals (Current goals can be found in the Care Plan section) Acute Rehab PT Goals Patient Stated Goal: go home today PT Goal Formulation: All assessment and education complete, DC therapy    Frequency     Barriers to discharge        Co-evaluation               End of Session Equipment Utilized During Treatment: Gait belt;Oxygen Activity Tolerance: Patient tolerated treatment well Patient left: in bed;with call bell/phone within reach Nurse Communication: Mobility status (Nurse aware PT was evaluating)         Time: 1610-9604 PT Time Calculation (min) (ACUTE ONLY): 21 min   Charges:   PT Evaluation $PT Eval Low Complexity: 1 Procedure     PT G Codes:        Frederic Tones LUBECK 12/06/2015, 11:41 AM

## 2015-12-06 NOTE — Care Management Note (Addendum)
Case Management Note  Patient Details  Name: Clide DeutscherDwight E Nations MRN: [id number] Date of Birth: 10-20-1945  Subjective/Objective: Pt admitted for PE- On IV Heparin gtt. CM received consult for medication assistance. Benefits check sent out for Eliquis and Xarelto.    Action/Plan: Once Benefits check completed. CM will make pt aware of cost. CM will continue to monitor for additional home needs   Expected Discharge Date:                  Expected Discharge Plan:  Home w Home Health Services  In-House Referral:  NA  Discharge planning Services  CM Consult  Post Acute Care Choice:  Home Health Choice offered to:  Patient  DME Arranged:  Oxygen DME Agency:  [facility]  [facility]  [facility]  Status of Service:  Completed, signed off  Medicare Important Message Given:  Yes Date Medicare IM Given:    Medicare IM give by:    Date Additional Medicare IM Given:    Additional Medicare Important Message give by:     If discussed at Long Length of Stay Meetings, dates discussed:    Additional Comments: 1013 12-06-15 Tomi BambergerBrenda Graves-Bigelow, RN,BSN [phone] CM did speak with pt in regards to DME 02. [facility]. CM did make [facility] Portable tank to be delivered to room before d/c. Per wife [facility] CM will send orders for therapy to them. CM did call [facility] in ref to Eliquis and the medication is available. No further needs from CM at this time.     1620 12-05-15 Tomi BambergerBrenda Graves-Bigelow, RN,BSN [phone] CM will provide pt with 30 day free Eliquis card. CM will continue to monitor for 02 need at home as well.   S/W SHATRREYNA @ [facility] # [phone]   ELIQUIS 2.5G BID  AND 5 MG BID  ( 30 )   COVER- YES           YES  CO-PAY- $35.00         SAME  TIER- 3 DRUG          SAME   PRIOR APPROVAL - YES # [phone]   SAME   XARELTO 20 MG PO DAILY  ( 30 )   COVER- YES  CO-PAY- $ 35.00  TIER- 3 DRUG  PRIOR APPROVAL - YES # [phone] [phone]   PHARMACY : [facility]  Gala LewandowskyGraves-Bigelow, Caryl Manas Kaye, RN 12/06/2015, 10:12 AM

## 2015-12-06 NOTE — Progress Notes (Signed)
CARDIAC REHAB PHASE I   Pt in bed, states he has not walked since admission. PT to see pt prior to d/c to evaluate home health needs. Reviewed education with pt. Discussed restrictions, IS, exercise guidelines, daily weights, s/s heart failure, sodium restrictions, heart healthy diet, diabetes diet and phase 2 cardiac rehab. Pt was referred to phase 2 cardiac rehab in [place] 10/2015, pt states they called and he declined. Encouraged pt to contact cardiac rehab to see if it is possible for him to attend. Pt verbalized understanding. Pt in bed, call bell within reach.  [phone] Joylene GrapesEmily C Odis Turck, RN, BSN 12/06/2015 10:44 AM

## 2015-12-11 ENCOUNTER — Ambulatory Visit
Admission: RE | Admit: 2015-12-11 | Discharge: 2015-12-11 | Disposition: A | Discharge status: Home / Self Care | Payer: Medicare Other | Source: Ambulatory Visit | Attending: Cardiothoracic Surgery | Admitting: Cardiothoracic Surgery

## 2015-12-11 ENCOUNTER — Ambulatory Visit (INDEPENDENT_AMBULATORY_CARE_PROVIDER_SITE_OTHER): Payer: Self-pay | Admitting: Physician Assistant

## 2015-12-11 VITALS — BP 98/65 | HR 110 | Resp 16 | Ht 75.0 in | Wt 295.0 lb

## 2015-12-11 DIAGNOSIS — I2510 Atherosclerotic heart disease of native coronary artery without angina pectoris: Secondary | ICD-10-CM

## 2015-12-11 DIAGNOSIS — Z951 Presence of aortocoronary bypass graft: Secondary | ICD-10-CM

## 2015-12-11 NOTE — Progress Notes (Signed)
HPI:  Patient returns for routine postoperative follow-up having undergone R CEA, CABG x 3  on 10/19/2015 The patient's early postoperative recovery while in the hospital was notable for respiratory failure with inability to wean from vent requiring tracheostomy.  Aggressive pressor support, diuretic therapy, and deconditioning.  Since hospital discharge the patient reports he is doing okay.  He is home from SNF, but states he was recently admitted to the hospital and diagnosed with a DVT and is now taking Eliquis.  However, overall he feels is making progress. He does wish he could progress a little quicker.  He is ambulating with use of a rolling walker.  His appetite and bowel habits are back to normal.     Current Outpatient Prescriptions  Medication Sig Dispense Refill  . amLODipine (NORVASC) 5 MG tablet Take 1 tablet (5 mg total) by mouth daily. (Patient taking differently: Take 5 mg by mouth at bedtime. ) 90 tablet 3  . apixaban (ELIQUIS) 5 MG TABS tablet Take 10 mg (2 TABS ) TWICE A DAY TILL 12/11/15. On 12/12/15, start  (1tab) twice a day thereafter   30day supply 49 tablet 0  . Artificial Tear Ointment (DRY EYES OP) Place 1 drop into both eyes daily as needed (for dry eyes).    . busPIRone (BUSPAR) 15 MG tablet Take 15 mg by mouth daily.    . carvedilol (COREG) 25 MG tablet Take 0.5 tablets (12.5 mg total) by mouth 2 (two) times daily with a meal. (Patient taking differently: Take 25 mg by mouth at bedtime. ) 60 tablet 6  . Cholecalciferol (VITAMIN D3) 2000 UNITS TABS Take 4,000 Units by mouth at bedtime.     . clopidogrel (PLAVIX) 75 MG tablet Take 1 tablet (75 mg total) by mouth daily. (Patient taking differently: Take 75 mg by mouth every morning. )    . CYMBALTA 60 MG capsule Take 60 mg by mouth at bedtime.     Marland Kitchen ezetimibe (ZETIA) 10 MG tablet Take 1 tablet (10 mg total) by mouth daily. (Patient taking differently: Take 10 mg by mouth every morning. )    . furosemide (LASIX) 40  MG tablet Take 1 tablet (40 mg total) by mouth daily. 30 tablet 3  . HYDROcodone-acetaminophen (NORCO) 10-325 MG tablet Take 1 tablet by mouth every 6 (six) hours as needed for moderate pain. (Patient taking differently: Take 0.5-2 tablets by mouth every 6 (six) hours as needed for moderate pain. ) 30 tablet 0  . insulin glargine (LANTUS) 100 UNIT/ML injection Inject 64 Units into the skin at bedtime.     . isosorbide mononitrate (IMDUR) 60 MG 24 hr tablet Take 60 mg by mouth at bedtime.    Marland Kitchen LINZESS 145 MCG CAPS capsule Take 145 mcg by mouth every other day.     . mirtazapine (REMERON) 15 MG tablet Take 15 mg by mouth at bedtime.     Marland Kitchen NOVOLOG FLEXPEN 100 UNIT/ML FlexPen Inject 24 Units into the skin 2 (two) times daily.     . pantoprazole (PROTONIX) 40 MG tablet Take 1 tablet (40 mg total) by mouth daily.    . potassium chloride SA (K-DUR,KLOR-CON) 20 MEQ tablet Take 1 tablet (20 mEq total) by mouth daily. (Patient taking differently: Take 20 mEq by mouth every morning. )    . QUEtiapine (SEROQUEL) 400 MG tablet Take 400 mg by mouth at bedtime.    . nitroGLYCERIN (NITROSTAT) 0.4 MG SL tablet Place 1 tablet (0.4 mg total) under the tongue  every 5 (five) minutes as needed for chest pain (up to 3 doses). 100 tablet 4   No current facility-administered medications for this visit.    Physical Exam:  BP 98/65 mmHg  Pulse 110  Resp 16  Ht 6\' 3"  (1.905 m)  Wt 295 lb (133.811 kg)  BMI 36.87 kg/m2  SpO2 98%  Gen: no apparent distress Heart: RRR Lungs: CTA bilaterally, remains on oxygen Abd: soft non-tender, non-distended Ext: no edema present Wounds: well healed  Diagnostic Tests:  CXR: no significant pleural effusions, some atelectasis bilaterally  A/P:  1. S/P CABG, R CEA- complicated hospital stay, completed stay at SNF... Making slow progress which is expected with patient's body habitus and complicated hospital course 2. DVT- on Eliquis 3. L Carotid Stenosis- per vascular 4.  Dispo- patient making slow progress, encouraged patient to resume use of IS for atelectasis, continue ambulating as tolerated, continue home health PT... RTC in 3 months to assess progress  Lowella DandyBARRETT, Briauna Gilmartin, PA-C [facility] [phone]

## 2015-12-13 ENCOUNTER — Encounter: Payer: Self-pay | Admitting: Vascular Surgery

## 2015-12-19 ENCOUNTER — Ambulatory Visit (INDEPENDENT_AMBULATORY_CARE_PROVIDER_SITE_OTHER): Payer: Self-pay | Admitting: Vascular Surgery

## 2015-12-19 VITALS — BP 117/70 | HR 108 | Temp 97.0°F | Resp 118 | Ht 75.0 in | Wt 275.0 lb

## 2015-12-19 DIAGNOSIS — I6523 Occlusion and stenosis of bilateral carotid arteries: Secondary | ICD-10-CM

## 2015-12-19 DIAGNOSIS — I6529 Occlusion and stenosis of unspecified carotid artery: Secondary | ICD-10-CM | POA: Insufficient documentation

## 2015-12-19 NOTE — Progress Notes (Signed)
Subjective:     Patient ID: Tyler Le, male   DOB: 06/24/1946, 70 y.o.   MRN: [id number]  HPI this 70 year old male returns for initial follow-up regarding his right carotid endarterectomy in conjunction with redo coronary artery bypass grafting with Dr. Kathlee NationsPeter Van Trigt performed in April 2017. Patient had a prolonged postoperative course because of chronic tobacco abuse and required prolonged pulmonary intubation. He was discharged from rehabilitation 2 weeks ago. Patient did have to return to the hospital for a pulmonary embolus and currently is on Eliquis. Patient had right carotid endarterectomy performed which was technically quite difficult because of the high nature of his blockage but he did quite well with no neurologic complications. He has had no further neurologic symptoms since his discharge from the hospital. He denies lateralizing weakness, aphasia, amaurosis fugax, diplopia, blurred vision, syncope. He did have swallowing issues for some time after surgery which resolved and not felt to be necessarily due to the carotid dissection but more due to prolonged intubation and other issues.   Review of Systems     Objective:   Physical Exam BP 117/70 mmHg  Pulse 108  Temp(Src) 97 F (36.1 C)  Resp 118  Ht 6\' 3"  (1.905 m)  Wt 275 lb (124.739 kg)  BMI 34.37 kg/m2  SpO2 94%  Gen. elderly male no apparent distress alert and oriented 3 Right neck incision is healed nicely with 3+ carotid pulse and audible bruit Left carotid with 2+ and soft bruit audible Neurologic exam unremarkable     Assessment:     Status post redo coronary artery bypass grafting in conjunction with right carotid endarterectomy in April 2017 Recent pulmonary embolus currently onEliquis Severe contralateral left ICA stenosis-velocities 370/120 at the time of recent carotid duplex preoperatively-this is currently asymptomatic    Plan:     Patient will return in 6 months to see Dr. early with follow-up  carotid duplex exam at that time and will determine if he is candidate at that point for left carotid endarterectomy.

## 2015-12-29 ENCOUNTER — Emergency Department (HOSPITAL_COMMUNITY): Payer: Medicare Other

## 2015-12-29 ENCOUNTER — Encounter (HOSPITAL_COMMUNITY): Payer: Self-pay | Admitting: Emergency Medicine

## 2015-12-29 ENCOUNTER — Emergency Department (HOSPITAL_COMMUNITY)
Admission: EM | Admit: 2015-12-29 | Discharge: 2015-12-29 | Disposition: A | Discharge status: Home / Self Care | Payer: Medicare Other | Attending: Emergency Medicine | Admitting: Emergency Medicine

## 2015-12-29 DIAGNOSIS — Z79899 Other long term (current) drug therapy: Secondary | ICD-10-CM | POA: Insufficient documentation

## 2015-12-29 DIAGNOSIS — I951 Orthostatic hypotension: Secondary | ICD-10-CM | POA: Insufficient documentation

## 2015-12-29 DIAGNOSIS — I10 Essential (primary) hypertension: Secondary | ICD-10-CM | POA: Insufficient documentation

## 2015-12-29 DIAGNOSIS — Z951 Presence of aortocoronary bypass graft: Secondary | ICD-10-CM | POA: Diagnosis not present

## 2015-12-29 DIAGNOSIS — Z7901 Long term (current) use of anticoagulants: Secondary | ICD-10-CM | POA: Insufficient documentation

## 2015-12-29 DIAGNOSIS — I2510 Atherosclerotic heart disease of native coronary artery without angina pectoris: Secondary | ICD-10-CM | POA: Insufficient documentation

## 2015-12-29 DIAGNOSIS — Z87891 Personal history of nicotine dependence: Secondary | ICD-10-CM | POA: Diagnosis not present

## 2015-12-29 DIAGNOSIS — I959 Hypotension, unspecified: Secondary | ICD-10-CM | POA: Diagnosis present

## 2015-12-29 DIAGNOSIS — Z794 Long term (current) use of insulin: Secondary | ICD-10-CM | POA: Diagnosis not present

## 2015-12-29 DIAGNOSIS — E119 Type 2 diabetes mellitus without complications: Secondary | ICD-10-CM | POA: Diagnosis not present

## 2015-12-29 LAB — CBC
HCT: 40.6 % (ref 39.0–52.0)
HEMOGLOBIN: 12.8 g/dL — AB (ref 13.0–17.0)
MCH: 28.3 pg (ref 26.0–34.0)
MCHC: 31.5 g/dL (ref 30.0–36.0)
MCV: 89.6 fL (ref 78.0–100.0)
PLATELETS: 177 10*3/uL (ref 150–400)
RBC: 4.53 MIL/uL (ref 4.22–5.81)
RDW: 14.5 % (ref 11.5–15.5)
WBC: 5.7 10*3/uL (ref 4.0–10.5)

## 2015-12-29 LAB — URINALYSIS, ROUTINE W REFLEX MICROSCOPIC
BILIRUBIN URINE: NEGATIVE
Glucose, UA: NEGATIVE mg/dL
Hgb urine dipstick: NEGATIVE
KETONES UR: NEGATIVE mg/dL
LEUKOCYTES UA: NEGATIVE
NITRITE: NEGATIVE
PH: 5 (ref 5.0–8.0)
PROTEIN: NEGATIVE mg/dL
Specific Gravity, Urine: 1.017 (ref 1.005–1.030)

## 2015-12-29 LAB — I-STAT TROPONIN, ED: TROPONIN I, POC: 0 ng/mL (ref 0.00–0.08)

## 2015-12-29 LAB — BASIC METABOLIC PANEL
ANION GAP: 8 (ref 5–15)
BUN: 15 mg/dL (ref 6–20)
CALCIUM: 9.3 mg/dL (ref 8.9–10.3)
CO2: 26 mmol/L (ref 22–32)
CREATININE: 1.43 mg/dL — AB (ref 0.61–1.24)
Chloride: 104 mmol/L (ref 101–111)
GFR, EST AFRICAN AMERICAN: 55 mL/min — AB (ref >60)
GFR, EST NON AFRICAN AMERICAN: 47 mL/min — AB (ref >60)
Glucose, Bld: 150 mg/dL — ABNORMAL HIGH (ref 65–99)
Potassium: 4.1 mmol/L (ref 3.5–5.1)
SODIUM: 138 mmol/L (ref 135–145)

## 2015-12-29 MED ORDER — SODIUM CHLORIDE 0.9 % IV BOLUS (SEPSIS)
500.0000 mL | Freq: Once | INTRAVENOUS | Status: AC
  Administered 2015-12-29: 500 mL via INTRAVENOUS

## 2015-12-29 NOTE — ED Notes (Signed)
Pt ambulated self efficiently with no difficulty and no complaints.

## 2015-12-29 NOTE — Discharge Instructions (Signed)
Return to the ED with any concerns including chest pain, fainting, difficulty breathing, leg swelling, weakness of arms or legs, changes in vision or speech, decreased level of alertness/lethargy, or any other alarming symptoms

## 2015-12-29 NOTE — ED Notes (Signed)
Pt ambulates independently and with steady gait at time of discharge. Discharge instructions and follow up information reviewed with patient. No other questions or concerns voiced at this time.  

## 2015-12-29 NOTE — ED Provider Notes (Signed)
CSN: [id number]     Arrival date & time 12/29/15  1315 History   First MD Initiated Contact with Patient 12/29/15 1614     Chief Complaint  Patient presents with  . Weakness  . Hypotension     (Consider location/radiation/quality/duration/timing/severity/associated sxs/prior Treatment) HPI  Pt presenting with c/o generalized weakness and feeling dizzy upon standing.  He states he has had these symptoms for the past 3 days.  [facility] and had a low BP measured there- he was advised to go the ED for further evaluation.  No chest pain no shortness of breath.  No nausea or diaphoresis.  No fever or coughing.  No abdominal pain.  Does not have vertigo or focal weakness.  No changes in vision or speech. He states he feels faint only when he stands up.  He has not had any vomiting or diarrhea.  He has not had any changes in his medications- take his bp meds at night.   There are no other associated systemic symptoms, there are no other alleviating or modifying factors.   Past Medical History  Diagnosis Date  . Essential hypertension   . Dyslipidemia   . Coronary artery disease     a. 2001: s/p CABG X4;  b. 08/2010 s/p PCI/BMS to VG->RI->OM;  c. 06/2014 Cath/PCI: LM nl, LAD 11178m, LCX 7443m, RI 100, OM1 100, OM2 80, small, RCA 100ost, VG->RCA 100, VG->Diag 100, VG->RI->OM1 70-80 ISR (4.0x24 Promus DES), LIMA->LAD nl.  . Morbid obesity (HCC)   . Dysrhythmia   . History of blood transfusion 2001    "related to OHS"  . Anemia   . GERD (gastroesophageal reflux disease)   . Depression   . Anxiety   . Nephrolithiasis   . Complication of anesthesia     "they have a hard time waking him up"  . Type II diabetes mellitus (HCC)   . Arthritis     "back, neck" (07/11/2014)  . Chronic lower back pain   . Thrombocytopenia (HCC)     a. Borderline low platelets by labs noted in 06/2014 but also noted on prior labs as well.  . Renal insufficiency     a. noted  06/2014 - baseline unclear as no data since 2012.  Marland Kitchen. RBBB    Past Surgical History  Procedure Laterality Date  . Cardiac catheterization  2001; ~ 2010  . Coronary artery bypass graft  2001    "CABG X 5"  . Appendectomy  ~ 1955  . Lumbar laminectomy  01/2011  . Back surgery    . Cholecystectomy    . Shoulder arthroscopy w/ rotator cuff repair Left 1990's  . Cardiovascular stress test  07/03/2009    EF 64%  . Left heart catheterization with coronary/graft angiogram N/A 05/09/2011    Procedure: LEFT HEART CATHETERIZATION WITH Isabel CapriceORONARY/GRAFT ANGIOGRAM;  Surgeon: Kathleene Hazelhristopher D McAlhany, MD;  [facility];  Service: Cardiovascular;  Laterality: N/A;  . Tonsillectomy and adenoidectomy  1951  . Left heart catheterization with coronary angiogram N/A 07/11/2014    Procedure: LEFT HEART CATHETERIZATION WITH CORONARY ANGIOGRAM;  Surgeon: Peter M SwazilandJordan, MD;  [facility];  Service: Cardiovascular;  Laterality: N/A;  . Coronary angioplasty with stent placement  March 2012    BMS to SVG to intermediate/OM  . Coronary angioplasty with stent placement  07/11/2014    "1"  . Cardiac catheterization N/A 10/16/2015    Procedure: Left Heart  Cath and Cors/Grafts Angiography;  Surgeon: Corky Crafts, MD;  [facility] INVASIVE CV LAB;  Service: Cardiovascular;  Laterality: N/A;  . Coronary artery bypass graft N/A 10/20/2015    Procedure: REDO CORONARY ARTERY BYPASS GRAFTING (CABG), TIMES THREE, USING LEFT GREATER SAPHENOUS VEIN HARVESTED ENDOSCOPICALLY AND CRYO VEIN, WITH CORONARY ENDARTERECTOMY;  Surgeon: Kerin Perna, MD;  Location: [facility] OR;  Service: Open Heart Surgery;  Laterality: N/A;  SVG to LAD, SVG to OM, CRYOVEIN to RCA with Endarterectomy  . Tee without cardioversion N/A 10/20/2015    Procedure: TRANSESOPHAGEAL ECHOCARDIOGRAM (TEE);  Surgeon: Kerin Perna, MD;  Location: [facility] OR;  Service: Open Heart Surgery;  Laterality: N/A;  . Endarterectomy Right 10/20/2015    Procedure:  ENDARTERECTOMY CAROTID;  Surgeon: Pryor Ochoa, MD;  Location: [facility] OR;  Service: Vascular;  Laterality: Right;   Family History  Problem Relation Age of Onset  . Heart disease Mother   . Heart disease Father   . Heart attack Father   . Heart disease Brother    Social History  Substance Use Topics  . Smoking status: Former Smoker -- 4.00 packs/day for 25 years    Types: Cigarettes  . Smokeless tobacco: Former Neurosurgeon    Types: Chew     Comment: "quit smoking ~ 1985; chewed off and on for a few years; quit chewing in ~ 1985 too"  . Alcohol Use: Yes     Comment: 07/11/2014 "might take a drink 5X/yr"    Review of Systems  ROS reviewed and all otherwise negative except for mentioned in HPI    Allergies  Metformin and related; Pravachol; Ramipril; and Wellbutrin  Home Medications   Prior to Admission medications   Medication Sig Start Date End Date Taking? Authorizing Provider  amLODipine (NORVASC) 5 MG tablet Take 1 tablet (5 mg total) by mouth daily. Patient taking differently: Take 5 mg by mouth at bedtime.  01/17/15  Yes Cassell Clement, MD  apixaban (ELIQUIS) 5 MG TABS tablet Take 10 mg (2 TABS ) TWICE A DAY TILL 12/11/15. On 12/12/15, start  (1tab) twice a day thereafter   30day supply 12/06/15  Yes Ripudeep K Rai, MD  Artificial Tear Ointment (DRY EYES OP) Place 1 drop into both eyes daily as needed (for dry eyes).   Yes Historical Provider, MD  busPIRone (BUSPAR) 15 MG tablet Take 15 mg by mouth daily. 08/22/15  Yes Historical Provider, MD  carvedilol (COREG) 25 MG tablet Take 0.5 tablets (12.5 mg total) by mouth 2 (two) times daily with a meal. Patient taking differently: Take 25 mg by mouth at bedtime.  07/12/14  Yes Dayna N Dunn, PA-C  Cholecalciferol (VITAMIN D3) 2000 UNITS TABS Take 4,000 Units by mouth at bedtime.    Yes Historical Provider, MD  clopidogrel (PLAVIX) 75 MG tablet Take 1 tablet (75 mg total) by mouth daily. Patient taking differently: Take 75 mg by mouth  every morning.  11/13/15  Yes Erin R Barrett, PA-C  CYMBALTA 60 MG capsule Take 60 mg by mouth at bedtime.  03/30/12  Yes Historical Provider, MD  ezetimibe (ZETIA) 10 MG tablet Take 1 tablet (10 mg total) by mouth daily. Patient taking differently: Take 10 mg by mouth every morning.  11/13/15  Yes Erin R Barrett, PA-C  furosemide (LASIX) 40 MG tablet Take 1 tablet (40 mg total) by mouth daily. 12/06/15  Yes Ripudeep Jenna Luo, MD  HYDROcodone-acetaminophen (NORCO) 10-325 MG tablet Take 1 tablet by mouth every 6 (six) hours  as needed for moderate pain. Patient taking differently: Take 0.5-2 tablets by mouth every 6 (six) hours as needed for moderate pain.  11/13/15  Yes Erin R Barrett, PA-C  insulin glargine (LANTUS) 100 UNIT/ML injection Inject 64 Units into the skin at bedtime.    Yes Historical Provider, MD  isosorbide mononitrate (IMDUR) 60 MG 24 hr tablet Take 60 mg by mouth at bedtime. 10/11/15  Yes Historical Provider, MD  LINZESS 145 MCG CAPS capsule Take 145 mcg by mouth every other day.  09/28/15  Yes Historical Provider, MD  mirtazapine (REMERON) 15 MG tablet Take 15 mg by mouth at bedtime.  03/30/12  Yes Historical Provider, MD  nitroGLYCERIN (NITROSTAT) 0.4 MG SL tablet Place 1 tablet (0.4 mg total) under the tongue every 5 (five) minutes as needed for chest pain (up to 3 doses). 06/14/14 12/29/15 Yes Cassell Clement, MD  NOVOLOG FLEXPEN 100 UNIT/ML FlexPen Inject 24 Units into the skin 2 (two) times daily.  07/01/13  Yes Historical Provider, MD  pantoprazole (PROTONIX) 40 MG tablet Take 1 tablet (40 mg total) by mouth daily. 11/14/15  Yes Erin R Barrett, PA-C  potassium chloride SA (K-DUR,KLOR-CON) 20 MEQ tablet Take 1 tablet (20 mEq total) by mouth daily. Patient taking differently: Take 20 mEq by mouth every morning.  11/14/15  Yes Erin R Barrett, PA-C  QUEtiapine (SEROQUEL) 400 MG tablet Take 200 mg by mouth at bedtime.    Yes Historical Provider, MD   BP 168/87 mmHg  Pulse 101  Temp(Src) 98.1 F  (36.7 C) (Oral)  Resp 18  Ht 6\' 3"  (1.905 m)  Wt 125.193 kg  BMI 34.50 kg/m2  SpO2 96%  Vitals reviewed Physical Exam  Physical Examination: General appearance - alert, well appearing, and in no distress Mental status - alert, oriented to person, place, and time Eyes - pupils equal and reactive, extraocular eye movements intact Mouth - mucous membranes moist, pharynx normal without lesions Chest - clear to auscultation, no wheezes, rales or rhonchi, symmetric air entry Heart - normal rate, regular rhythm, normal S1, S2, no murmurs, rubs, clicks or gallops Abdomen - soft, nontender, nondistended, no masses or organomegaly Neurological - alert, oriented x 3, cranial nerves intact, strength 5/5 in extremities x 4, sensation intact Extremities - peripheral pulses normal, no pedal edema, no clubbing or cyanosis Skin - normal coloration and turgor, no rashes  ED Course  Procedures (including critical care time) Labs Review Labs Reviewed  BASIC METABOLIC PANEL - Abnormal; Notable for the following:    Glucose, Bld 150 (*)    Creatinine, Ser 1.43 (*)    GFR calc non Af Amer 47 (*)    GFR calc Af Amer 55 (*)    All other components within normal limits  CBC - Abnormal; Notable for the following:    Hemoglobin 12.8 (*)    All other components within normal limits  URINALYSIS, ROUTINE W REFLEX MICROSCOPIC (NOT AT [facility])  Rosezena Sensor, ED    Imaging Review Dg Chest 2 View  12/29/2015  CLINICAL DATA:  Hypotension EXAM: CHEST  2 VIEW COMPARISON:  12/11/2015 FINDINGS: Cardiac shadow remains enlarged. Postsurgical changes are again seen. The lungs are clear bilaterally. No acute bony abnormality is seen. IMPRESSION: No active cardiopulmonary disease. Electronically Signed   By: Alcide Clever M.D.   On: 12/29/2015 14:38   I have personally reviewed and evaluated these images and lab results as part of my medical decision-making.   EKG Interpretation   Date/Time:  Friday  December 29 2015  13:26:53 EDT Ventricular Rate:  104 PR Interval:  172 QRS Duration: 154 QT Interval:  390 QTC Calculation: 512 R Axis:   73 Text Interpretation:  Sinus tachycardia Right bundle branch block Cannot  rule out Inferior infarct , age undetermined Abnormal ECG No significant  change since last tracing Confirmed by [facility]  MD, Bethania Schlotzhauer [phone]([id number]) on  12/29/2015 4:14:42 PM      MDM   Final diagnoses:  Orthostatic hypotension    Pt presenting with c/o lightheadedness upon standing.  Pt was hypotensive at his PMD this morning.  Pt has no chest pain or shortness of breath.  No fever to suggest sepsis.  EKG and troponin are reassuring as well as other labs.  Urinalysis obtained and negative.  After IV fluids in the ED pt feels significantly improved.  He is able to ambulate around in the ED without recurrence of symptoms.  Discharged with strict return precautions.  Pt agreeable with plan.    Jerelyn ScottMartha Linker, MD 12/29/15 2133

## 2015-12-29 NOTE — ED Notes (Signed)
Pt sts hypotension and generalized weakness with dizziness upon standing x 3 days; pt sts CABG the end of May with prolonged hospital stay; pt denies pain or SOB

## 2016-01-10 ENCOUNTER — Encounter: Payer: Self-pay | Admitting: Cardiovascular Disease

## 2016-01-10 ENCOUNTER — Ambulatory Visit (INDEPENDENT_AMBULATORY_CARE_PROVIDER_SITE_OTHER): Payer: Medicare Other | Admitting: Cardiovascular Disease

## 2016-01-10 ENCOUNTER — Encounter (INDEPENDENT_AMBULATORY_CARE_PROVIDER_SITE_OTHER): Payer: Self-pay

## 2016-01-10 VITALS — BP 160/90 | HR 112 | Ht 75.0 in | Wt 279.4 lb

## 2016-01-10 DIAGNOSIS — I25118 Atherosclerotic heart disease of native coronary artery with other forms of angina pectoris: Secondary | ICD-10-CM

## 2016-01-10 DIAGNOSIS — I2510 Atherosclerotic heart disease of native coronary artery without angina pectoris: Secondary | ICD-10-CM

## 2016-01-10 MED ORDER — APIXABAN 5 MG PO TABS: 5.0000 mg | ORAL_TABLET | Freq: Two times a day (BID) | ORAL | Status: AC

## 2016-01-10 MED ORDER — EZETIMIBE 10 MG PO TABS: 10.0000 mg | ORAL_TABLET | Freq: Every day | ORAL | Status: AC

## 2016-01-10 MED ORDER — POTASSIUM CHLORIDE CRYS ER 20 MEQ PO TBCR: 20.0000 meq | EXTENDED_RELEASE_TABLET | Freq: Every day | ORAL | Status: DC

## 2016-01-10 MED ORDER — CARVEDILOL 25 MG PO TABS: 12.5000 mg | ORAL_TABLET | Freq: Two times a day (BID) | ORAL | Status: DC

## 2016-01-10 MED ORDER — CLOPIDOGREL BISULFATE 75 MG PO TABS: 75.0000 mg | ORAL_TABLET | Freq: Every day | ORAL | Status: DC

## 2016-01-10 MED ORDER — AMLODIPINE BESYLATE 5 MG PO TABS: 2.5000 mg | ORAL_TABLET | Freq: Every day | ORAL | Status: DC

## 2016-01-10 MED ORDER — FUROSEMIDE 40 MG PO TABS: 40.0000 mg | ORAL_TABLET | Freq: Every day | ORAL | Status: DC

## 2016-01-10 NOTE — Patient Instructions (Signed)
Medication Instructions:  Your physician recommends that you continue on your current medications as directed. Please refer to the Current Medication list given to you today. Refills of your medications have been sent to your pharmacy   Labwork: Your physician recommends that you return for lab work in: 3 weeks for basic metabolic panel   Testing/Procedures: None Ordered   Follow-Up: Your physician recommends that you schedule a follow-up appointment in: 2 months with Dr. Elease HashimotoNahser.    If you need a refill on your cardiac medications before your next appointment, please call your pharmacy.   Thank you for choosing [facility]! Eligha BridegroomMichelle Eleanore Junio, RN [phone]

## 2016-01-10 NOTE — Progress Notes (Signed)
Cardiology Office Note   Date:  01/10/2016   ID:  Tyler Le, DOB [birth year], MRN [id number]  PCP:  Kaleen Mask, MD  Cardiologist: Cassell Clement MD, now Mayukha Symmonds  Chief Complaint  Patient presents with  . Follow-up    cad, HTN   Problem list 1. Coronary artery disease-status post coronary artery bypass grafting x 2  2. Hypertension 3. Diabetes mellitus 4.  Dyslipidemia 5. Pulmonary embolus    History of Present Illness: Tyler Le is a 70 y.o. male who presents for  Scheduled six-month follow-up visit  Tyler Le is a 70 y.o. male who presents for follow-up office visit He has a history of known ischemic heart disease. He underwent coronary artery bypass graft surgery in 2001. In March 2012 he underwent bare-metal stent placement to the saphenous vein graft to the intermediate and obtuse marginal.  The patient was seen in December 2015 complaining of more frequent chest pain The patient had a Lexiscan Myoview stress test on 06/29/14 which showed Intermediate risk stress nuclear study demonstrating a mostly reversible defect along the mid to distal anterior wall/apical wall distribution. 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 Cardiac catheterization was recommended. On 07/11/14 patient underwent cardiac catheterization by Dr. Peter Swaziland with the following findings: 1. Severe 3 vessel obstructive CAD 2. Patent LIMA to the LAD. 3. Restenosis in the stent in SVG to ramus/OM 4. Occluded SVG to diagonal 5. Occluded SVG to RCA 6. Successful stenting of the SVG to ramus/OM ostium with a DES.   Recommendations:  Continue DAPT indefinitely if possible. Needs to take at least one year.  Since last visit the patient has been doing reasonably well. His blood pressure has been running low yesterday and today. He has been feeling more fatigued. He has not had any syncope. He has occasional chest pain and takes sublingual  nitroglycerin with improvement. He continues to have problems with anxiety attacks, particularly at night when he lies down to go to sleep.   the patient comes in today for a scheduled follow-up visit. He states that he is not taking aspirin. And he is only taking the Brilinta once a day. He has been having some shortness of breath. He fell last week and injured his left shoulder.  Since then he is also been having some left shoulder discomfort. He has been taking occasional sublingual nitroglycerin for chest pain. He has not been careful with his diet and his weight is up. He has been eating salty food. He has edema.  October 11, 2015:  Here to discuss stopping the Brilinta in order to get a thyroid bx.   Has been having exertional intrascapular pain with any exertion .  Also has severe DOE. NTG relieves the CP  Tries to eat a low salt diet.  Went to the ER on 1 occasion.   Work up in the ER was negative and he was sent home.  Has severe exertional CP - across the chest  And into the intra scapular region . Relieved to SL NGT.  January 10, 2016:  Tyler Le was hospitalized in May for angina . Had redo CABG with carotid surgery  Complicated by respiratory failure . ultimagely needed a trach  Tyler Le was hospitalized with a saddle pulmonary embolus  A month later  (June, 2017):  Still short of breath.  [facility]  Was at the [place] 3-4 weeks ago with hypotension  -  coreg was stopped at that point    Past Medical History  Diagnosis Date  . Essential hypertension   . Dyslipidemia   . Coronary artery disease     a. 2001: s/p CABG X4;  b. 08/2010 s/p PCI/BMS to VG->RI->OM;  c. 06/2014 Cath/PCI: LM nl, LAD 197m, LCX 83m, RI 100, OM1 100, OM2 80, small, RCA 100ost, VG->RCA 100, VG->Diag 100, VG->RI->OM1 70-80 ISR (4.0x24 Promus DES), LIMA->LAD nl.  . Morbid obesity (HCC)   . Dysrhythmia   . History of blood transfusion 2001    "related to OHS"  . Anemia   .  GERD (gastroesophageal reflux disease)   . Depression   . Anxiety   . Nephrolithiasis   . Complication of anesthesia     "they have a hard time waking him up"  . Type II diabetes mellitus (HCC)   . Arthritis     "back, neck" (07/11/2014)  . Chronic lower back pain   . Thrombocytopenia (HCC)     a. Borderline low platelets by labs noted in 06/2014 but also noted on prior labs as well.  . Renal insufficiency     a. noted 06/2014 - baseline unclear as no data since 2012.  Marland Kitchen RBBB     Past Surgical History  Procedure Laterality Date  . Cardiac catheterization  2001; ~ 2010  . Coronary artery bypass graft  2001    "CABG X 5"  . Appendectomy  ~ 1955  . Lumbar laminectomy  01/2011  . Back surgery    . Cholecystectomy    . Shoulder arthroscopy w/ rotator cuff repair Left 1990's  . Cardiovascular stress test  07/03/2009    EF 64%  . Left heart catheterization with coronary/graft angiogram N/A 05/09/2011    Procedure: LEFT HEART CATHETERIZATION WITH Isabel Caprice;  Surgeon: Kathleene Hazel, MD;  Location: [facility] C[facility] LAB;  Service: Cardiovascular;  Laterality: N/A;  . Tonsillectomy and adenoidectomy  1951  . Left heart catheterization with coronary angiogram N/A 07/11/2014    Procedure: LEFT HEART CATHETERIZATION WITH CORONARY ANGIOGRAM;  Surgeon: Peter M Swaziland, MD;  Location: [facility] CATH LAB;  Service: Cardiovascular;  Laterality: N/A;  . Coronary angioplasty with stent placement  March 2012    BMS to SVG to intermediate/OM  . Coronary angioplasty with stent placement  07/11/2014    "1"  . Cardiac catheterization N/A 10/16/2015    Procedure: Left Heart Cath and Cors/Grafts Angiography;  Surgeon: Corky Crafts, MD;  Location: [facility] INVASIVE CV LAB;  Service: Cardiovascular;  Laterality: N/A;  . Coronary artery bypass graft N/A 10/20/2015    Procedure: REDO CORONARY ARTERY BYPASS GRAFTING (CABG), TIMES THREE, USING LEFT GREATER SAPHENOUS VEIN HARVESTED ENDOSCOPICALLY AND CRYO VEIN,  WITH CORONARY ENDARTERECTOMY;  Surgeon: Kerin Perna, MD;  Location: [facility] OR;  Service: Open Heart Surgery;  Laterality: N/A;  SVG to LAD, SVG to OM, CRYOVEIN to RCA with Endarterectomy  . Tee without cardioversion N/A 10/20/2015    Procedure: TRANSESOPHAGEAL ECHOCARDIOGRAM (TEE);  Surgeon: Kerin Perna, MD;  Location: [facility] OR;  Service: Open Heart Surgery;  Laterality: N/A;  . Endarterectomy Right 10/20/2015    Procedure: ENDARTERECTOMY CAROTID;  Surgeon: Pryor Ochoa, MD;  Location: [facility] OR;  Service: Vascular;  Laterality: Right;     Current Outpatient Prescriptions  Medication Sig Dispense Refill  . amLODipine (NORVASC) 5 MG tablet Take 1 tablet (5 mg total) by mouth daily. (Patient taking differently: Take 5 mg by mouth at bedtime. ) 90 tablet  3  . apixaban (ELIQUIS) 5 MG TABS tablet Take 10 mg (2 TABS ) TWICE A DAY TILL 12/11/15. On 12/12/15, start 5mg  (1tab) twice a day thereafter   30day supply 49 tablet 0  . Artificial Tear Ointment (DRY EYES OP) Place 1 drop into both eyes daily as needed (for dry eyes).    . busPIRone (BUSPAR) 15 MG tablet Take 15 mg by mouth daily.    . carvedilol (COREG) 25 MG tablet Take 0.5 tablets (12.5 mg total) by mouth 2 (two) times daily with a meal. (Patient taking differently: Take 25 mg by mouth at bedtime. ) 60 tablet 6  . Cholecalciferol (VITAMIN D3) 2000 UNITS TABS Take 4,000 Units by mouth at bedtime.     . clopidogrel (PLAVIX) 75 MG tablet Take 1 tablet (75 mg total) by mouth daily. (Patient taking differently: Take 75 mg by mouth every morning. )    . CYMBALTA 60 MG capsule Take 60 mg by mouth at bedtime.     Marland Kitchen ezetimibe (ZETIA) 10 MG tablet Take 1 tablet (10 mg total) by mouth daily. (Patient taking differently: Take 10 mg by mouth every morning. )    . furosemide (LASIX) 40 MG tablet Take 1 tablet (40 mg total) by mouth daily. 30 tablet 3  . HYDROcodone-acetaminophen (NORCO) 10-325 MG tablet Take 1 tablet by mouth every 6 (six) hours as needed for  moderate pain. (Patient taking differently: Take 0.5-2 tablets by mouth every 6 (six) hours as needed for moderate pain. ) 30 tablet 0  . insulin glargine (LANTUS) 100 UNIT/ML injection Inject 64 Units into the skin at bedtime.     . isosorbide mononitrate (IMDUR) 60 MG 24 hr tablet Take 60 mg by mouth at bedtime.    Marland Kitchen LINZESS 145 MCG CAPS capsule Take 145 mcg by mouth every other day.     . mirtazapine (REMERON) 15 MG tablet Take 15 mg by mouth at bedtime.     . nitroGLYCERIN (NITROSTAT) 0.4 MG SL tablet Place 1 tablet (0.4 mg total) under the tongue every 5 (five) minutes as needed for chest pain (up to 3 doses). 100 tablet 4  . NOVOLOG FLEXPEN 100 UNIT/ML FlexPen Inject 24 Units into the skin 2 (two) times daily.     . pantoprazole (PROTONIX) 40 MG tablet Take 1 tablet (40 mg total) by mouth daily.    . potassium chloride SA (K-DUR,KLOR-CON) 20 MEQ tablet Take 1 tablet (20 mEq total) by mouth daily. (Patient taking differently: Take 20 mEq by mouth every morning. )    . QUEtiapine (SEROQUEL) 400 MG tablet Take 200 mg by mouth at bedtime.      No current facility-administered medications for this visit.    Allergies:   Metformin and related; Pravachol; Ramipril; and Wellbutrin    Social History:  The patient  reports that he has quit smoking. His smoking use included Cigarettes. He has a 100 pack-year smoking history. He has quit using smokeless tobacco. His smokeless tobacco use included Chew. He reports that he drinks alcohol. He reports that he does not use illicit drugs.   Family History:  The patient's family history includes Heart attack in his father; Heart disease in his brother, father, and mother.    ROS:  Please see the history of present illness.   Otherwise, review of systems are positive for none.   All other systems are reviewed and negative.    PHYSICAL EXAM: VS:  BP 160/90 mmHg  Pulse 112  Ht  6\' 3"  (1.905 m)  Wt 279 lb 6.4 oz (126.735 kg)  BMI 34.92 kg/m2 , BMI Body  mass index is 34.92 kg/(m^2). GEN: Well nourished, well developed, in no acute distress HEENT: has a small piece of suture coming from his trach incision  Neck: no JVD, carotid bruits, or masses Cardiac: RRR; no murmurs, rubs, or gallops,  Trace edema   Respiratory:  clear to auscultation bilaterally, normal work of breathing GI: soft, nontender, nondistended, + BS MS: no deformity or atrophy Skin: warm and dry, no rash Neuro:  Strength and sensation are intact Psych: euthymic mood, full affect   EKG:  EKG is not ordered today.    Recent Labs: 11/04/2015: TSH 2.645 11/05/2015: Magnesium 2.2 11/07/2015: ALT 65* 12/02/2015: B Natriuretic Peptide 64.3 12/29/2015: BUN 15; Creatinine, Ser 1.43*; Hemoglobin 12.8*; Platelets 177; Potassium 4.1; Sodium 138    Lipid Panel    Component Value Date/Time   CHOL 130 05/09/2011 0505   TRIG 130 11/01/2015 1030   HDL 27* 05/09/2011 0505   CHOLHDL 4.8 05/09/2011 0505   VLDL 72* 05/09/2011 0505   LDLCALC 31 05/09/2011 0505      Wt Readings from Last 3 Encounters:  01/10/16 279 lb 6.4 oz (126.735 kg)  12/29/15 276 lb (125.193 kg)  12/19/15 275 lb (124.739 kg)        ASSESSMENT AND PLAN: 1. Ischemic heart disease.   S/p re-do CABG Refill lasix, zetia, coreg  Seems to be making slow progress.    2. Pulmonary embolus - continue Eliquis 5 BID  He may need lifelong anticoagulation   3. S/p trach  - we removed a suture from his trach scar today . Cleaned with betadine , Dressed with bandage   4. Depression 5. Dyslipidemia - refill zetia   6. Diabetes mellitus type 2 7. Chronic low back pain 8. Mild renal insufficiency     Current medicines are reviewed at length with the patient today.  The patient does not have concerns regarding medicines.    Labs/ tests ordered today include:  No orders of the defined types were placed in this encounter.      Kristeen Miss, MD  01/10/2016 11:12 AM    [facility] [address] [place],  [address] [place], [place]  [address] Pager [phone] Phone: [phone]; Fax: [phone]

## 2016-01-31 ENCOUNTER — Other Ambulatory Visit (INDEPENDENT_AMBULATORY_CARE_PROVIDER_SITE_OTHER): Payer: Medicare Other

## 2016-01-31 DIAGNOSIS — I2510 Atherosclerotic heart disease of native coronary artery without angina pectoris: Secondary | ICD-10-CM | POA: Diagnosis not present

## 2016-01-31 LAB — BASIC METABOLIC PANEL
BUN: 15 mg/dL (ref 7–25)
CHLORIDE: 104 mmol/L (ref 98–110)
CO2: 22 mmol/L (ref 20–31)
Calcium: 9.4 mg/dL (ref 8.6–10.3)
Creat: 1.16 mg/dL (ref 0.70–1.18)
GLUCOSE: 146 mg/dL — AB (ref 65–99)
POTASSIUM: 3.9 mmol/L (ref 3.5–5.3)
SODIUM: 138 mmol/L (ref 135–146)

## 2016-02-23 ENCOUNTER — Encounter: Payer: Self-pay | Admitting: Cardiovascular Disease

## 2016-03-11 ENCOUNTER — Ambulatory Visit: Payer: Medicare Other | Admitting: Cardiovascular Disease

## 2016-03-13 ENCOUNTER — Encounter: Payer: Medicare Other | Admitting: Cardiothoracic Surgery

## 2016-03-19 ENCOUNTER — Other Ambulatory Visit: Payer: Self-pay | Admitting: Cardiothoracic Surgery

## 2016-03-19 DIAGNOSIS — Z951 Presence of aortocoronary bypass graft: Secondary | ICD-10-CM

## 2016-03-20 ENCOUNTER — Encounter: Payer: Self-pay | Admitting: Cardiothoracic Surgery

## 2016-03-20 ENCOUNTER — Ambulatory Visit
Admission: RE | Admit: 2016-03-20 | Discharge: 2016-03-20 | Disposition: A | Discharge status: Home / Self Care | Payer: Medicare Other | Source: Ambulatory Visit | Attending: Cardiothoracic Surgery | Admitting: Cardiothoracic Surgery

## 2016-03-20 ENCOUNTER — Ambulatory Visit (INDEPENDENT_AMBULATORY_CARE_PROVIDER_SITE_OTHER): Payer: Medicare Other | Admitting: Cardiothoracic Surgery

## 2016-03-20 VITALS — BP 110/63 | HR 100 | Resp 20 | Ht 75.0 in | Wt 280.0 lb

## 2016-03-20 DIAGNOSIS — I2510 Atherosclerotic heart disease of native coronary artery without angina pectoris: Secondary | ICD-10-CM

## 2016-03-20 DIAGNOSIS — Z951 Presence of aortocoronary bypass graft: Secondary | ICD-10-CM

## 2016-03-20 NOTE — Progress Notes (Signed)
PCP is Kaleen Mask, MD Referring Provider is Nahser, Deloris Ping, MD  Chief Complaint  Patient presents with  . Routine Post Op    3 month f/u with CXR    HPI: Follow-up after redo CABG earlier this year. He had a combined right carotid endarterectomy at that time. Due to lack of conduit one of his bypass grafts was performed using cryopreserved vein. For that reason the patient was started on Plavix. Proximally 6 weeks after redo CABG the patient was admitted with a saddle pulmonary embolus. He was transitioned from IV heparin to Elliquuis was. The patient will need to be on eliquis for life and I believe that taking both Plavix and the NOAC puts the patient at increased risk for bleeding. He will stop the Plavix and revert to aspirin 81 mg.   Otherwise the patient is doing well without recurrent angina. He is remaining fairly sedentary. He had some dizziness and was seen in the emergency department in the summer but was not admitted.  The patient has a known residual left carotid artery stenosis which will be repaired by VVS in the future.  Past Medical History:  Diagnosis Date  . Anemia   . Anxiety   . Arthritis    "back, neck" (07/11/2014)  . Chronic lower back pain   . Complication of anesthesia    "they have a hard time waking him up"  . Coronary artery disease    a. 2001: s/p CABG X4;  b. 08/2010 s/p PCI/BMS to VG->RI->OM;  c. 06/2014 Cath/PCI: LM nl, LAD 152m, LCX 88m, RI 100, OM1 100, OM2 80, small, RCA 100ost, VG->RCA 100, VG->Diag 100, VG->RI->OM1 70-80 ISR (4.0x24 Promus DES), LIMA->LAD nl.  . Depression   . Dyslipidemia   . Dysrhythmia   . Essential hypertension   . GERD (gastroesophageal reflux disease)   . History of blood transfusion 2001   "related to OHS"  . Morbid obesity (HCC)   . Nephrolithiasis   . RBBB   . Renal insufficiency    a. noted 06/2014 - baseline unclear as no data since 2012.  . Thrombocytopenia (HCC)    a. Borderline low platelets by labs  noted in 06/2014 but also noted on prior labs as well.  . Type II diabetes mellitus (HCC)     Past Surgical History:  Procedure Laterality Date  . APPENDECTOMY  ~ 1955  . BACK SURGERY    . CARDIAC CATHETERIZATION  2001; ~ 2010  . CARDIAC CATHETERIZATION N/A 10/16/2015   Procedure: Left Heart Cath and Cors/Grafts Angiography;  Surgeon: Corky Crafts, MD;  Location: [facility] INVASIVE CV LAB;  Service: Cardiovascular;  Laterality: N/A;  . CARDIOVASCULAR STRESS TEST  07/03/2009   EF 64%  . CHOLECYSTECTOMY    . CORONARY ANGIOPLASTY WITH STENT PLACEMENT  March 2012   BMS to SVG to intermediate/OM  . CORONARY ANGIOPLASTY WITH STENT PLACEMENT  07/11/2014   "1"  . CORONARY ARTERY BYPASS GRAFT  2001   "CABG X 5"  . CORONARY ARTERY BYPASS GRAFT N/A 10/20/2015   Procedure: REDO CORONARY ARTERY BYPASS GRAFTING (CABG), TIMES THREE, USING LEFT GREATER SAPHENOUS VEIN HARVESTED ENDOSCOPICALLY AND CRYO VEIN, WITH CORONARY ENDARTERECTOMY;  Surgeon: Kerin Perna, MD;  Location: [facility] OR;  Service: Open Heart Surgery;  Laterality: N/A;  SVG to LAD, SVG to OM, CRYOVEIN to RCA with Endarterectomy  . ENDARTERECTOMY Right 10/20/2015   Procedure: ENDARTERECTOMY CAROTID;  Surgeon: Pryor Ochoa, MD;  Location: [facility] OR;  Service: Vascular;  Laterality: Right;  . LEFT HEART CATHETERIZATION WITH CORONARY ANGIOGRAM N/A 07/11/2014   Procedure: LEFT HEART CATHETERIZATION WITH CORONARY ANGIOGRAM;  Surgeon: Joeli Fenner M SwazilandJordan, MD;  [facility];  Service: Cardiovascular;  Laterality: N/A;  . LEFT HEART CATHETERIZATION WITH CORONARY/GRAFT ANGIOGRAM N/A 05/09/2011   Procedure: LEFT HEART CATHETERIZATION WITH Isabel CapriceORONARY/GRAFT ANGIOGRAM;  Surgeon: Kathleene Hazelhristopher D McAlhany, MD;  [facility];  Service: Cardiovascular;  Laterality: N/A;  . LUMBAR LAMINECTOMY  01/2011  . SHOULDER ARTHROSCOPY W/ ROTATOR CUFF REPAIR Left 1990's  . TEE WITHOUT CARDIOVERSION N/A 10/20/2015   Procedure: TRANSESOPHAGEAL ECHOCARDIOGRAM (TEE);   Surgeon: Kerin PernaPeter Van Trigt, MD;  [facility];  Service: Open Heart Surgery;  Laterality: N/A;  . TONSILLECTOMY AND ADENOIDECTOMY  1951    Family History  Problem Relation Age of Onset  . Heart disease Mother   . Heart disease Father   . Heart attack Father   . Heart disease Brother     Social History Social History  Substance Use Topics  . Smoking status: Former Smoker    Packs/day: 4.00    Years: 25.00    Types: Cigarettes  . Smokeless tobacco: Former NeurosurgeonUser    Types: Chew     Comment: "quit smoking ~ 1985; chewed off and on for a few years; quit chewing in ~ 1985 too"  . Alcohol use Yes     Comment: 07/11/2014 "might take a drink 5X/yr"    Current Outpatient Prescriptions  Medication Sig Dispense Refill  . amLODipine (NORVASC) 5 MG tablet Take 0.5 tablets (2.5 mg total) by mouth daily. 45 tablet 3  . apixaban (ELIQUIS) 5 MG TABS tablet Take 1 tablet (5 mg total) by mouth 2 (two) times daily. 60 tablet 11  . Artificial Tear Ointment (DRY EYES OP) Place 1 drop into both eyes daily as needed (for dry eyes).    . busPIRone (BUSPAR) 15 MG tablet Take 15 mg by mouth daily.    . carvedilol (COREG) 25 MG tablet Take 0.5 tablets (12.5 mg total) by mouth 2 (two) times daily with a meal. 60 tablet 11  . Cholecalciferol (VITAMIN D3) 2000 UNITS TABS Take 4,000 Units by mouth at bedtime.     . clopidogrel (PLAVIX) 75 MG tablet Take 1 tablet (75 mg total) by mouth daily. 30 tablet 11  . CYMBALTA 60 MG capsule Take 60 mg by mouth at bedtime.     Marland Kitchen. ezetimibe (ZETIA) 10 MG tablet Take 1 tablet (10 mg total) by mouth daily. 30 tablet 11  . furosemide (LASIX) 40 MG tablet Take 1 tablet (40 mg total) by mouth daily. 30 tablet 11  . HYDROcodone-acetaminophen (NORCO) 10-325 MG tablet Take 1 tablet by mouth every 6 (six) hours as needed for moderate pain. (Patient taking differently: Take 0.5-2 tablets by mouth every 6 (six) hours as needed for moderate pain. ) 30 tablet 0  . insulin glargine  (LANTUS) 100 UNIT/ML injection Inject 64 Units into the skin at bedtime.     . isosorbide mononitrate (IMDUR) 60 MG 24 hr tablet Take 60 mg by mouth at bedtime.    Marland Kitchen. LINZESS 145 MCG CAPS capsule Take 145 mcg by mouth every other day.     . mirtazapine (REMERON) 15 MG tablet Take 15 mg by mouth at bedtime.     Marland Kitchen. NOVOLOG FLEXPEN 100 UNIT/ML FlexPen Inject 24 Units into the skin 2 (two) times daily.     . pantoprazole (PROTONIX) 40 MG tablet Take 1 tablet (40 mg  total) by mouth daily.    . potassium chloride SA (K-DUR,KLOR-CON) 20 MEQ tablet Take 1 tablet (20 mEq total) by mouth daily. 30 tablet 11  . QUEtiapine (SEROQUEL) 400 MG tablet Take 200 mg by mouth at bedtime.     . nitroGLYCERIN (NITROSTAT) 0.4 MG SL tablet Place 1 tablet (0.4 mg total) under the tongue every 5 (five) minutes as needed for chest pain (up to 3 doses). 100 tablet 4   No current facility-administered medications for this visit.     Allergies  Allergen Reactions  . Metformin And Related Nausea And Vomiting  . Pravachol Nausea And Vomiting  . Ramipril Nausea Only  . Wellbutrin [Bupropion Hcl] Nausea And Vomiting    Review of Systems  No chest pain   baseline dyspnea on exertion No edema Chest and neck incisions well-healed  BP 110/63 (BP Location: Left Arm, Patient Position: Sitting, Cuff Size: Normal)   Pulse 100   Resp 20   Ht 6\' 3"  (1.905 m)   Wt 280 lb (127 kg)   SpO2 92% Comment: RA  BMI 35.00 kg/m  Physical Exam     General- alert and comfortable   Lungs- clear without rales, wheezes   Cor- regular rate and rhythm, no murmur , gallop   Abdomen- soft, non-tender   Extremities - warm, non-tender, minimal edema   Neuro- oriented, appropriate, no focal weakness   Diagnostic Tests:  chest x-ray clear   Impression:  doing well now after redo CABG.   he will transition from Plavix to aspirin since to be on eliquis for an extended period of time for the saddle pulmonary embolus  Plan: return as  needed    Mikey Bussing, MD [facility] [phone]

## 2016-03-29 ENCOUNTER — Encounter: Payer: Self-pay | Admitting: Cardiovascular Disease

## 2016-03-29 ENCOUNTER — Ambulatory Visit (INDEPENDENT_AMBULATORY_CARE_PROVIDER_SITE_OTHER): Payer: Medicare Other | Admitting: Cardiovascular Disease

## 2016-03-29 VITALS — BP 138/80 | HR 108 | Ht 75.0 in | Wt 287.2 lb

## 2016-03-29 DIAGNOSIS — I2510 Atherosclerotic heart disease of native coronary artery without angina pectoris: Secondary | ICD-10-CM

## 2016-03-29 NOTE — Patient Instructions (Signed)

## 2016-03-29 NOTE — Progress Notes (Signed)
Cardiology Office Note   Date:  03/29/2016   ID:  Tyler Le, DOB 1946/06/03, MRN [id number]  PCP:  Kaleen Mask, MD  Cardiologist: Cassell Clement MD, now Mckoy Bhakta  Chief Complaint  Patient presents with  . Coronary Artery Disease   Problem list 1. Coronary artery disease-status post coronary artery bypass grafting x 2  2. Hypertension 3. Diabetes mellitus 4.  Dyslipidemia 5. Pulmonary embolus     Tyler Le is a 70 y.o. male who presents for  Scheduled six-month follow-up visit  Tyler Le is a 70 y.o. male who presents for follow-up office visit He has a history of known ischemic heart disease. He underwent coronary artery bypass graft surgery in 2001. In March 2012 he underwent bare-metal stent placement to the saphenous vein graft to the intermediate and obtuse marginal.  The patient was seen in December 2015 complaining of more frequent chest pain The patient had a Lexiscan Myoview stress test on 06/29/14 which showed Intermediate risk stress nuclear study demonstrating a mostly reversible defect along the mid to distal anterior wall/apical wall distribution. 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 Cardiac catheterization was recommended. On 07/11/14 patient underwent cardiac catheterization by Dr. Peter Swaziland with the following findings: 1. Severe 3 vessel obstructive CAD 2. Patent LIMA to the LAD. 3. Restenosis in the stent in SVG to ramus/OM 4. Occluded SVG to diagonal 5. Occluded SVG to RCA 6. Successful stenting of the SVG to ramus/OM ostium with a DES.   Recommendations:  Continue DAPT indefinitely if possible. Needs to take at least one year.  Since last visit the patient has been doing reasonably well. His blood pressure has been running low yesterday and today. He has been feeling more fatigued. He has not had any syncope. He has occasional chest pain and takes sublingual nitroglycerin with  improvement. He continues to have problems with anxiety attacks, particularly at night when he lies down to go to sleep.   the patient comes in today for a scheduled follow-up visit. He states that he is not taking aspirin. And he is only taking the Brilinta once a day. He has been having some shortness of breath. He fell last week and injured his left shoulder.  Since then he is also been having some left shoulder discomfort. He has been taking occasional sublingual nitroglycerin for chest pain. He has not been careful with his diet and his weight is up. He has been eating salty food. He has edema.  October 11, 2015:  Here to discuss stopping the Brilinta in order to get a thyroid bx.   Has been having exertional intrascapular pain with any exertion .  Also has severe DOE. NTG relieves the CP  Tries to eat a low salt diet.  Went to the ER on 1 occasion.   Work up in the ER was negative and he was sent home.  Has severe exertional CP - across the chest  And into the intra scapular region . Relieved to SL NGT.  January 10, 2016:  Tyler Le was hospitalized in May for angina . Had redo CABG with carotid surgery  Complicated by respiratory failure . ultimagely needed a trach  Tyler Le was hospitalized with a saddle pulmonary embolus  A month later  (June, 2017):  Still short of breath.  [facility]  Was at the [place] 3-4 weeks ago with hypotension  - coreg was stopped at  that point   Oct. 6 2017:  Tyler Le is seen back today for follow up .  Hx of CAD, pulmonary embolus.  Back to normal Not much exercise,   Walks around the driveway on occasion   Past Medical History:  Diagnosis Date  . Anemia   . Anxiety   . Arthritis    "back, neck" (07/11/2014)  . Chronic lower back pain   . Complication of anesthesia    "they have a hard time waking him up"  . Coronary artery disease    a. 2001: s/p CABG X4;  b. 08/2010 s/p PCI/BMS to VG->RI->OM;  c. 06/2014  Cath/PCI: LM nl, LAD 19625m, LCX 355m, RI 100, OM1 100, OM2 80, small, RCA 100ost, VG->RCA 100, VG->Diag 100, VG->RI->OM1 70-80 ISR (4.0x24 Promus DES), LIMA->LAD nl.  . Depression   . Dyslipidemia   . Dysrhythmia   . Essential hypertension   . GERD (gastroesophageal reflux disease)   . History of blood transfusion 2001   "related to OHS"  . Morbid obesity (HCC)   . Nephrolithiasis   . RBBB   . Renal insufficiency    a. noted 06/2014 - baseline unclear as no data since 2012.  . Thrombocytopenia (HCC)    a. Borderline low platelets by labs noted in 06/2014 but also noted on prior labs as well.  . Type II diabetes mellitus (HCC)     Past Surgical History:  Procedure Laterality Date  . APPENDECTOMY  ~ 1955  . BACK SURGERY    . CARDIAC CATHETERIZATION  2001; ~ 2010  . CARDIAC CATHETERIZATION N/A 10/16/2015   Procedure: Left Heart Cath and Cors/Grafts Angiography;  Surgeon: Corky CraftsJayadeep S Varanasi, MD;  [facility];  Service: Cardiovascular;  Laterality: N/A;  . CARDIOVASCULAR STRESS TEST  07/03/2009   EF 64%  . CHOLECYSTECTOMY    . CORONARY ANGIOPLASTY WITH STENT PLACEMENT  March 2012   BMS to SVG to intermediate/OM  . CORONARY ANGIOPLASTY WITH STENT PLACEMENT  07/11/2014   "1"  . CORONARY ARTERY BYPASS GRAFT  2001   "CABG X 5"  . CORONARY ARTERY BYPASS GRAFT N/A 10/20/2015   Procedure: REDO CORONARY ARTERY BYPASS GRAFTING (CABG), TIMES THREE, USING LEFT GREATER SAPHENOUS VEIN HARVESTED ENDOSCOPICALLY AND CRYO VEIN, WITH CORONARY ENDARTERECTOMY;  Surgeon: Kerin PernaPeter Van Trigt, MD;  Location: [facility] OR;  Service: Open Heart Surgery;  Laterality: N/A;  SVG to LAD, SVG to OM, CRYOVEIN to RCA with Endarterectomy  . ENDARTERECTOMY Right 10/20/2015   Procedure: ENDARTERECTOMY CAROTID;  Surgeon: Pryor OchoaJames D Lawson, MD;  [facility];  Service: Vascular;  Laterality: Right;  . LEFT HEART CATHETERIZATION WITH CORONARY ANGIOGRAM N/A 07/11/2014   Procedure: LEFT HEART CATHETERIZATION WITH CORONARY  ANGIOGRAM;  Surgeon: Peter M SwazilandJordan, MD;  [facility];  Service: Cardiovascular;  Laterality: N/A;  . LEFT HEART CATHETERIZATION WITH CORONARY/GRAFT ANGIOGRAM N/A 05/09/2011   Procedure: LEFT HEART CATHETERIZATION WITH Isabel CapriceORONARY/GRAFT ANGIOGRAM;  Surgeon: Kathleene Hazelhristopher D McAlhany, MD;  [facility];  Service: Cardiovascular;  Laterality: N/A;  . LUMBAR LAMINECTOMY  01/2011  . SHOULDER ARTHROSCOPY W/ ROTATOR CUFF REPAIR Left 1990's  . TEE WITHOUT CARDIOVERSION N/A 10/20/2015   Procedure: TRANSESOPHAGEAL ECHOCARDIOGRAM (TEE);  Surgeon: Kerin PernaPeter Van Trigt, MD;  [facility];  Service: Open Heart Surgery;  Laterality: N/A;  . TONSILLECTOMY AND ADENOIDECTOMY  1951     Current Outpatient Prescriptions  Medication Sig Dispense Refill  . amLODipine (NORVASC) 5 MG tablet Take 0.5 tablets (2.5 mg total) by  mouth daily. 45 tablet 3  . apixaban (ELIQUIS) 5 MG TABS tablet Take 1 tablet (5 mg total) by mouth 2 (two) times daily. 60 tablet 11  . Artificial Tear Ointment (DRY EYES OP) Place 1 drop into both eyes daily as needed (for dry eyes).    . busPIRone (BUSPAR) 15 MG tablet Take 15 mg by mouth daily.    . carvedilol (COREG) 25 MG tablet Take 0.5 tablets (12.5 mg total) by mouth 2 (two) times daily with a meal. 60 tablet 11  . Cholecalciferol (VITAMIN D3) 2000 UNITS TABS Take 4,000 Units by mouth at bedtime.     . CYMBALTA 60 MG capsule Take 60 mg by mouth at bedtime.     Marland Kitchen ezetimibe (ZETIA) 10 MG tablet Take 1 tablet (10 mg total) by mouth daily. 30 tablet 11  . furosemide (LASIX) 40 MG tablet Take 1 tablet (40 mg total) by mouth daily. 30 tablet 11  . HYDROcodone-acetaminophen (NORCO) 10-325 MG tablet Take 1 tablet by mouth every 6 (six) hours as needed for moderate pain. (Patient taking differently: Take 0.5-2 tablets by mouth every 6 (six) hours as needed for moderate pain. ) 30 tablet 0  . insulin glargine (LANTUS) 100 UNIT/ML injection Inject 64 Units into the skin at bedtime.     .  isosorbide mononitrate (IMDUR) 60 MG 24 hr tablet Take 60 mg by mouth at bedtime.    Marland Kitchen LINZESS 145 MCG CAPS capsule Take 145 mcg by mouth every other day.     . mirtazapine (REMERON) 15 MG tablet Take 15 mg by mouth at bedtime.     Marland Kitchen NOVOLOG FLEXPEN 100 UNIT/ML FlexPen Inject 24 Units into the skin 2 (two) times daily.     . pantoprazole (PROTONIX) 40 MG tablet Take 1 tablet (40 mg total) by mouth daily.    . potassium chloride SA (K-DUR,KLOR-CON) 20 MEQ tablet Take 1 tablet (20 mEq total) by mouth daily. 30 tablet 11  . QUEtiapine (SEROQUEL) 400 MG tablet Take 200 mg by mouth at bedtime.     . nitroGLYCERIN (NITROSTAT) 0.4 MG SL tablet Place 1 tablet (0.4 mg total) under the tongue every 5 (five) minutes as needed for chest pain (up to 3 doses). 100 tablet 4   No current facility-administered medications for this visit.     Allergies:   Metformin and related; Pravachol; Ramipril; and Wellbutrin [bupropion hcl]    Social History:  The patient  reports that he has quit smoking. His smoking use included Cigarettes. He has a 100.00 pack-year smoking history. He has quit using smokeless tobacco. His smokeless tobacco use included Chew. He reports that he drinks alcohol. He reports that he does not use drugs.   Family History:  The patient's family history includes Heart attack in his father; Heart disease in his brother, father, and mother.    ROS:  Please see the history of present illness.   Otherwise, review of systems are positive for none.   All other systems are reviewed and negative.    PHYSICAL EXAM: VS:  BP 138/80 (BP Location: Left Arm, Patient Position: Sitting, Cuff Size: Large)   Pulse (!) 108   Ht 6\' 3"  (1.905 m)   Wt 287 lb 3.2 oz (130.3 kg)   BMI 35.90 kg/m  , BMI Body mass index is 35.9 kg/m. GEN: Well nourished, well developed, in no acute distress  HEENT: has a small piece of suture coming from his trach incision  Neck: no JVD, carotid  bruits, or masses Cardiac: RRR;  no murmurs, rubs, or gallops,  Trace edema   Respiratory:  clear to auscultation bilaterally, normal work of breathing GI: soft, nontender, nondistended, + BS MS: no deformity or atrophy  Skin: warm and dry, no rash Neuro:  Strength and sensation are intact Psych: euthymic mood, full affect   EKG:  EKG is not ordered today.    Recent Labs: 11/04/2015: TSH 2.645 11/05/2015: Magnesium 2.2 11/07/2015: ALT 65 12/02/2015: B Natriuretic Peptide 64.3 12/29/2015: Hemoglobin 12.8; Platelets 177 01/31/2016: BUN 15; Creat 1.16; Potassium 3.9; Sodium 138    Lipid Panel    Component Value Date/Time   CHOL 130 05/09/2011 0505   TRIG 130 11/01/2015 1030   HDL 27 (L) 05/09/2011 0505   CHOLHDL 4.8 05/09/2011 0505   VLDL 72 (H) 05/09/2011 0505   LDLCALC 31 05/09/2011 0505      Wt Readings from Last 3 Encounters:  03/29/16 287 lb 3.2 oz (130.3 kg)  03/20/16 280 lb (127 kg)  01/10/16 279 lb 6.4 oz (126.7 kg)        ASSESSMENT AND PLAN: 1. Ischemic heart disease.   S/p re-do CABG Refill lasix, zetia, coreg  Seems to be making slow progress.    2. Pulmonary embolus - continue Eliquis 5 BID  He may need lifelong anticoagulation   3. Carotid artery disease Still has a left carotid stenosis  Will have him follow up with [facility].    4. Depression 5. Dyslipidemia - refill zetia   6. Diabetes mellitus type 2 7. Chronic low back pain 8. Mild renal insufficiency  9. S/p trach  -    Current medicines are reviewed at length with the patient today.  The patient does not have concerns regarding medicines.    Labs/ tests ordered today include:  No orders of the defined types were placed in this encounter.     Kristeen Miss, MD  03/29/2016 4:34 PM    [facility] [address] [place],  [address] [place], [place]  [address] Pager [phone] Phone: [phone]; Fax: [phone]

## 2016-04-05 ENCOUNTER — Other Ambulatory Visit: Payer: Self-pay | Admitting: Vascular Surgery

## 2016-04-05 DIAGNOSIS — I6529 Occlusion and stenosis of unspecified carotid artery: Secondary | ICD-10-CM

## 2016-06-11 ENCOUNTER — Encounter (HOSPITAL_COMMUNITY): Payer: Medicare Other

## 2016-06-11 ENCOUNTER — Ambulatory Visit: Payer: Medicare Other | Admitting: Vascular Surgery

## 2016-09-12 ENCOUNTER — Encounter (HOSPITAL_COMMUNITY): Payer: Self-pay

## 2016-09-12 ENCOUNTER — Telehealth: Payer: Self-pay | Admitting: Cardiovascular Disease

## 2016-09-12 ENCOUNTER — Inpatient Hospital Stay (HOSPITAL_COMMUNITY)
Admission: EM | Admit: 2016-09-12 | Discharge: 2016-09-18 | DRG: 280 | Disposition: A | Discharge status: Home / Self Care | Payer: Medicare Other | Attending: Cardiovascular Disease | Admitting: Cardiovascular Disease

## 2016-09-12 ENCOUNTER — Emergency Department (HOSPITAL_COMMUNITY): Payer: Medicare Other

## 2016-09-12 DIAGNOSIS — F419 Anxiety disorder, unspecified: Secondary | ICD-10-CM | POA: Diagnosis present

## 2016-09-12 DIAGNOSIS — Z79899 Other long term (current) drug therapy: Secondary | ICD-10-CM

## 2016-09-12 DIAGNOSIS — Z86718 Personal history of other venous thrombosis and embolism: Secondary | ICD-10-CM | POA: Diagnosis not present

## 2016-09-12 DIAGNOSIS — I25700 Atherosclerosis of coronary artery bypass graft(s), unspecified, with unstable angina pectoris: Secondary | ICD-10-CM | POA: Diagnosis present

## 2016-09-12 DIAGNOSIS — Z955 Presence of coronary angioplasty implant and graft: Secondary | ICD-10-CM

## 2016-09-12 DIAGNOSIS — Z794 Long term (current) use of insulin: Secondary | ICD-10-CM | POA: Diagnosis not present

## 2016-09-12 DIAGNOSIS — N179 Acute kidney failure, unspecified: Secondary | ICD-10-CM

## 2016-09-12 DIAGNOSIS — I25710 Atherosclerosis of autologous vein coronary artery bypass graft(s) with unstable angina pectoris: Secondary | ICD-10-CM | POA: Diagnosis not present

## 2016-09-12 DIAGNOSIS — Z7901 Long term (current) use of anticoagulants: Secondary | ICD-10-CM

## 2016-09-12 DIAGNOSIS — I4510 Unspecified right bundle-branch block: Secondary | ICD-10-CM | POA: Diagnosis present

## 2016-09-12 DIAGNOSIS — R079 Chest pain, unspecified: Secondary | ICD-10-CM | POA: Diagnosis present

## 2016-09-12 DIAGNOSIS — E1122 Type 2 diabetes mellitus with diabetic chronic kidney disease: Secondary | ICD-10-CM | POA: Diagnosis present

## 2016-09-12 DIAGNOSIS — F329 Major depressive disorder, single episode, unspecified: Secondary | ICD-10-CM | POA: Diagnosis present

## 2016-09-12 DIAGNOSIS — Z6836 Body mass index (BMI) 36.0-36.9, adult: Secondary | ICD-10-CM | POA: Diagnosis not present

## 2016-09-12 DIAGNOSIS — N183 Chronic kidney disease, stage 3 unspecified: Secondary | ICD-10-CM

## 2016-09-12 DIAGNOSIS — I255 Ischemic cardiomyopathy: Secondary | ICD-10-CM | POA: Diagnosis present

## 2016-09-12 DIAGNOSIS — I5031 Acute diastolic (congestive) heart failure: Secondary | ICD-10-CM | POA: Diagnosis present

## 2016-09-12 DIAGNOSIS — Z87891 Personal history of nicotine dependence: Secondary | ICD-10-CM

## 2016-09-12 DIAGNOSIS — I214 Non-ST elevation (NSTEMI) myocardial infarction: Principal | ICD-10-CM | POA: Diagnosis present

## 2016-09-12 DIAGNOSIS — N182 Chronic kidney disease, stage 2 (mild): Secondary | ICD-10-CM

## 2016-09-12 DIAGNOSIS — G8929 Other chronic pain: Secondary | ICD-10-CM | POA: Diagnosis present

## 2016-09-12 DIAGNOSIS — I25110 Atherosclerotic heart disease of native coronary artery with unstable angina pectoris: Secondary | ICD-10-CM | POA: Diagnosis present

## 2016-09-12 DIAGNOSIS — I25709 Atherosclerosis of coronary artery bypass graft(s), unspecified, with unspecified angina pectoris: Secondary | ICD-10-CM | POA: Diagnosis present

## 2016-09-12 DIAGNOSIS — E785 Hyperlipidemia, unspecified: Secondary | ICD-10-CM | POA: Diagnosis present

## 2016-09-12 DIAGNOSIS — I130 Hypertensive heart and chronic kidney disease with heart failure and stage 1 through stage 4 chronic kidney disease, or unspecified chronic kidney disease: Secondary | ICD-10-CM | POA: Diagnosis present

## 2016-09-12 DIAGNOSIS — K219 Gastro-esophageal reflux disease without esophagitis: Secondary | ICD-10-CM | POA: Diagnosis present

## 2016-09-12 DIAGNOSIS — E669 Obesity, unspecified: Secondary | ICD-10-CM | POA: Diagnosis present

## 2016-09-12 DIAGNOSIS — R739 Hyperglycemia, unspecified: Secondary | ICD-10-CM

## 2016-09-12 DIAGNOSIS — E1169 Type 2 diabetes mellitus with other specified complication: Secondary | ICD-10-CM | POA: Diagnosis present

## 2016-09-12 DIAGNOSIS — G4733 Obstructive sleep apnea (adult) (pediatric): Secondary | ICD-10-CM | POA: Diagnosis present

## 2016-09-12 DIAGNOSIS — I2510 Atherosclerotic heart disease of native coronary artery without angina pectoris: Secondary | ICD-10-CM | POA: Diagnosis present

## 2016-09-12 DIAGNOSIS — E0859 Diabetes mellitus due to underlying condition with other circulatory complications: Secondary | ICD-10-CM

## 2016-09-12 DIAGNOSIS — I10 Essential (primary) hypertension: Secondary | ICD-10-CM | POA: Diagnosis present

## 2016-09-12 DIAGNOSIS — Z86711 Personal history of pulmonary embolism: Secondary | ICD-10-CM

## 2016-09-12 HISTORY — DX: Acute diastolic (congestive) heart failure: I50.31

## 2016-09-12 HISTORY — DX: Atherosclerotic heart disease of native coronary artery without angina pectoris: I25.10

## 2016-09-12 LAB — CBC
HCT: 45.2 % (ref 39.0–52.0)
Hemoglobin: 14.8 g/dL (ref 13.0–17.0)
MCH: 31.3 pg (ref 26.0–34.0)
MCHC: 32.7 g/dL (ref 30.0–36.0)
MCV: 95.6 fL (ref 78.0–100.0)
PLATELETS: 104 10*3/uL — AB (ref 150–400)
RBC: 4.73 MIL/uL (ref 4.22–5.81)
RDW: 14 % (ref 11.5–15.5)
WBC: 6.3 10*3/uL (ref 4.0–10.5)

## 2016-09-12 LAB — BASIC METABOLIC PANEL
Anion gap: 11 (ref 5–15)
BUN: 16 mg/dL (ref 6–20)
CALCIUM: 8.7 mg/dL — AB (ref 8.9–10.3)
CO2: 29 mmol/L (ref 22–32)
CREATININE: 1.56 mg/dL — AB (ref 0.61–1.24)
Chloride: 100 mmol/L — ABNORMAL LOW (ref 101–111)
GFR calc non Af Amer: 43 mL/min — ABNORMAL LOW (ref >60)
GFR, EST AFRICAN AMERICAN: 49 mL/min — AB (ref >60)
Glucose, Bld: 212 mg/dL — ABNORMAL HIGH (ref 65–99)
Potassium: 4.8 mmol/L (ref 3.5–5.1)
SODIUM: 140 mmol/L (ref 135–145)

## 2016-09-12 LAB — I-STAT TROPONIN, ED: TROPONIN I, POC: 0.65 ng/mL — AB (ref 0.00–0.08)

## 2016-09-12 LAB — PROTIME-INR
INR: 1.01
Prothrombin Time: 13.3 seconds (ref 11.4–15.2)

## 2016-09-12 LAB — GLUCOSE, CAPILLARY: Glucose-Capillary: 232 mg/dL — ABNORMAL HIGH (ref 65–99)

## 2016-09-12 LAB — TROPONIN I: Troponin I: 1.54 ng/mL (ref <0.03)

## 2016-09-12 LAB — BRAIN NATRIURETIC PEPTIDE: B Natriuretic Peptide: 124.3 pg/mL — ABNORMAL HIGH (ref 0.0–100.0)

## 2016-09-12 MED ORDER — ASPIRIN EC 81 MG PO TBEC
81.0000 mg | DELAYED_RELEASE_TABLET | Freq: Every day | ORAL | Status: DC
  Administered 2016-09-14 – 2016-09-15 (×2): 81 mg via ORAL
  Filled 2016-09-12 (×2): qty 1

## 2016-09-12 MED ORDER — CLOPIDOGREL BISULFATE 75 MG PO TABS
75.0000 mg | ORAL_TABLET | ORAL | Status: DC
  Administered 2016-09-12 – 2016-09-17 (×6): 75 mg via ORAL
  Filled 2016-09-12 (×6): qty 1

## 2016-09-12 MED ORDER — INSULIN ASPART 100 UNIT/ML ~~LOC~~ SOLN
0.0000 [IU] | Freq: Three times a day (TID) | SUBCUTANEOUS | Status: DC
  Administered 2016-09-13: 4 [IU] via SUBCUTANEOUS
  Administered 2016-09-13: 3 [IU] via SUBCUTANEOUS
  Administered 2016-09-14 (×2): 11 [IU] via SUBCUTANEOUS
  Administered 2016-09-15 (×2): 4 [IU] via SUBCUTANEOUS
  Administered 2016-09-15: 11 [IU] via SUBCUTANEOUS
  Administered 2016-09-16 (×2): 7 [IU] via SUBCUTANEOUS
  Administered 2016-09-16 – 2016-09-17 (×2): 4 [IU] via SUBCUTANEOUS
  Administered 2016-09-17: 7 [IU] via SUBCUTANEOUS
  Administered 2016-09-17: 11 [IU] via SUBCUTANEOUS
  Administered 2016-09-18: 4 [IU] via SUBCUTANEOUS

## 2016-09-12 MED ORDER — EZETIMIBE 10 MG PO TABS
10.0000 mg | ORAL_TABLET | Freq: Every day | ORAL | Status: DC
  Administered 2016-09-13 – 2016-09-18 (×6): 10 mg via ORAL
  Filled 2016-09-12 (×6): qty 1

## 2016-09-12 MED ORDER — SODIUM CHLORIDE 0.9 % IV SOLN
INTRAVENOUS | Status: DC
  Administered 2016-09-13: 06:00:00 via INTRAVENOUS
  Administered 2016-09-13: 250 mL via INTRAVENOUS

## 2016-09-12 MED ORDER — ASPIRIN 81 MG PO CHEW
81.0000 mg | CHEWABLE_TABLET | ORAL | Status: AC
  Administered 2016-09-13: 81 mg via ORAL
  Filled 2016-09-12: qty 1

## 2016-09-12 MED ORDER — PANTOPRAZOLE SODIUM 40 MG PO TBEC
40.0000 mg | DELAYED_RELEASE_TABLET | Freq: Every day | ORAL | Status: DC
  Administered 2016-09-13 – 2016-09-18 (×6): 40 mg via ORAL
  Filled 2016-09-12 (×6): qty 1

## 2016-09-12 MED ORDER — ASPIRIN EC 81 MG PO TBEC: 81.0000 mg | DELAYED_RELEASE_TABLET | Freq: Every day | ORAL | Status: DC

## 2016-09-12 MED ORDER — ISOSORBIDE MONONITRATE ER 60 MG PO TB24
60.0000 mg | ORAL_TABLET | Freq: Every day | ORAL | Status: DC
Start: 2016-09-13 — End: 2016-09-14
  Administered 2016-09-13: 60 mg via ORAL
  Filled 2016-09-12: qty 1

## 2016-09-12 MED ORDER — INSULIN GLARGINE 100 UNIT/ML ~~LOC~~ SOLN
64.0000 [IU] | Freq: Every day | SUBCUTANEOUS | Status: DC
  Administered 2016-09-12 – 2016-09-17 (×6): 64 [IU] via SUBCUTANEOUS
  Filled 2016-09-12 (×6): qty 0.64

## 2016-09-12 MED ORDER — SODIUM CHLORIDE 0.9% FLUSH: 3.0000 mL | INTRAVENOUS | Status: DC | PRN

## 2016-09-12 MED ORDER — ASPIRIN EC 81 MG PO TBEC
81.0000 mg | DELAYED_RELEASE_TABLET | Freq: Once | ORAL | Status: AC
  Administered 2016-09-12: 81 mg via ORAL
  Filled 2016-09-12: qty 1

## 2016-09-12 MED ORDER — ACETAMINOPHEN 325 MG PO TABS
650.0000 mg | ORAL_TABLET | ORAL | Status: DC | PRN
  Administered 2016-09-13 – 2016-09-15 (×4): 650 mg via ORAL
  Filled 2016-09-12 (×5): qty 2

## 2016-09-12 MED ORDER — SODIUM CHLORIDE 0.9% FLUSH
3.0000 mL | Freq: Two times a day (BID) | INTRAVENOUS | Status: DC
  Administered 2016-09-12 – 2016-09-13 (×2): 3 mL via INTRAVENOUS

## 2016-09-12 MED ORDER — NITROGLYCERIN IN D5W 200-5 MCG/ML-% IV SOLN
5.0000 ug/min | Freq: Once | INTRAVENOUS | Status: AC
  Administered 2016-09-12: 5 ug/min via INTRAVENOUS

## 2016-09-12 MED ORDER — SODIUM CHLORIDE 0.9 % IV SOLN: 250.0000 mL | INTRAVENOUS | Status: DC | PRN

## 2016-09-12 MED ORDER — CARVEDILOL 12.5 MG PO TABS
12.5000 mg | ORAL_TABLET | Freq: Two times a day (BID) | ORAL | Status: DC
  Administered 2016-09-13 – 2016-09-18 (×10): 12.5 mg via ORAL
  Filled 2016-09-12 (×11): qty 1

## 2016-09-12 MED ORDER — HEPARIN (PORCINE) IN NACL 100-0.45 UNIT/ML-% IJ SOLN
1700.0000 [IU]/h | INTRAMUSCULAR | Status: DC
  Administered 2016-09-12: 1400 [IU]/h via INTRAVENOUS
  Filled 2016-09-12: qty 250

## 2016-09-12 MED ORDER — NITROGLYCERIN IN D5W 200-5 MCG/ML-% IV SOLN
0.0000 ug/min | Freq: Once | INTRAVENOUS | Status: AC
  Administered 2016-09-12: 5 ug/min via INTRAVENOUS
  Filled 2016-09-12: qty 250

## 2016-09-12 MED ORDER — QUETIAPINE FUMARATE 25 MG PO TABS
200.0000 mg | ORAL_TABLET | Freq: Every day | ORAL | Status: DC
  Administered 2016-09-12 – 2016-09-17 (×6): 200 mg via ORAL
  Filled 2016-09-12 (×6): qty 8

## 2016-09-12 MED ORDER — HEPARIN BOLUS VIA INFUSION
2000.0000 [IU] | Freq: Once | INTRAVENOUS | Status: AC
  Administered 2016-09-12: 2000 [IU] via INTRAVENOUS
  Filled 2016-09-12: qty 2000

## 2016-09-12 MED ORDER — NITROGLYCERIN 0.4 MG SL SUBL
0.4000 mg | SUBLINGUAL_TABLET | SUBLINGUAL | Status: DC | PRN
  Administered 2016-09-12 – 2016-09-15 (×10): 0.4 mg via SUBLINGUAL
  Filled 2016-09-12 (×3): qty 1

## 2016-09-12 MED ORDER — AMLODIPINE BESYLATE 5 MG PO TABS
2.5000 mg | ORAL_TABLET | Freq: Every day | ORAL | Status: DC
  Administered 2016-09-13 – 2016-09-18 (×5): 2.5 mg via ORAL
  Filled 2016-09-12 (×5): qty 1

## 2016-09-12 MED ORDER — ONDANSETRON HCL 4 MG/2ML IJ SOLN: 4.0000 mg | Freq: Four times a day (QID) | INTRAMUSCULAR | Status: DC | PRN

## 2016-09-12 NOTE — ED Notes (Signed)
Informed Dr. Erma HeritageIsaacs and first nurse of troponin result 0.65

## 2016-09-12 NOTE — Telephone Encounter (Signed)
Patient is calling and states that he is having sharp, stabbing pain between his shoulder blades, left arm and shoulder pain, intermittent chest pain, HA, and shortness of breath at rest. The patient states that this has been going on for the past 4-5 days but has gotten worse today. Patient states that he felt like this when he had to have his surgery before. Patient states that he was told to hold his eliquis for a dental procedure and has been off of it for the past 7 days. He states that he has not had his procedure yet. Patient was advised to be seen in the ER given his symptoms, history, and being off of Eliquis. Patient states that he may go to the hospital tomorrow. Patient was advised not to wait to be seen and I explained the importance of not waiting. Patient states that he will go to the hospital.

## 2016-09-12 NOTE — Telephone Encounter (Signed)
New Message    Pt c/o Shortness Of Breath: STAT if SOB developed within the last 24 hours or pt is noticeably SOB on the phone  1. Are you currently SOB (can you hear that pt is SOB on the phone)? Talking to wife   2. How long have you been experiencing SOB? 4-5   3. Are you SOB when sitting or when up moving around? both  4. Are you currently experiencing any other symptoms? Back pain , shoulder pain , feeling like he is being stabbed in his back between shoulder, headache  Stopped blood thinners week , to have dental work

## 2016-09-12 NOTE — ED Provider Notes (Signed)
[facility]-EMERGENCY DEPT Provider Note   CSN: [id number] Arrival date & time: 09/12/16  1617     History   Chief Complaint Chief Complaint  Patient presents with  . Chest Pain    HPI Tyler Le is a 71 y.o. male.  Pt presents to the ED today with CP.  Pt also has SOB.  Thept said that he has had intermittent pain for the past 4-5 days, but it is worse today.  He took 3 nitro at home which relieved his pain for about 1 hr, but it came back.  He did call Dr. Harvie BridgeNahser's office to tell them about the pain.  They advised him to come to the ED.  The pt notes that he's been off Eliquis for 1 week for a scheduled dental procedure.  Pt also notes that he can't walk across the room with out getting SOB.      Past Medical History:  Diagnosis Date  . Anemia   . Anxiety   . Arthritis    "back, neck" (07/11/2014)  . Chronic lower back pain   . Complication of anesthesia    "they have a hard time waking him up"  . Coronary artery disease    a. 2001: s/p CABG X4;  b. 08/2010 s/p PCI/BMS to VG->RI->OM;  c. 06/2014 Cath/PCI: LM nl, LAD 19765m, LCX 2357m, RI 100, OM1 100, OM2 80, small, RCA 100ost, VG->RCA 100, VG->Diag 100, VG->RI->OM1 70-80 ISR (4.0x24 Promus DES), LIMA->LAD nl.  . Depression   . Dyslipidemia   . Dysrhythmia   . Essential hypertension   . GERD (gastroesophageal reflux disease)   . History of blood transfusion 2001   "related to OHS"  . Morbid obesity (HCC)   . Nephrolithiasis   . RBBB   . Renal insufficiency    a. noted 06/2014 - baseline unclear as no data since 2012.  . Thrombocytopenia (HCC)    a. Borderline low platelets by labs noted in 06/2014 but also noted on prior labs as well.  . Type II diabetes mellitus [facility](HCC)     Patient Active Problem List   Diagnosis Date Noted  . Carotid stenosis, asymptomatic 12/19/2015  . Elevated troponin   . Chest pain 12/03/2015  . Hypoxemia 12/02/2015  . Pulmonary embolism (HCC) 12/02/2015  . Ileus (HCC)   . S/P CABG x 4   .  Surgery, elective   . Tracheostomy in place [facility](HCC)   . Debility   . Benign essential HTN   . Adjustment disorder with mixed anxiety and depressed mood   . Obesity   . Chronic low back pain   . OSA (obstructive sleep apnea)   . Dysphagia   . Tachycardia   . Tachypnea   . Acute blood loss anemia   . Left lower lobe pneumonia (HCC)   . Tracheostomy care (HCC)   . Atelectasis   . Feeding tube dysfunction   . Acute respiratory failure with hypoxemia (HCC)   . S/P CABG x 3 10/20/2015  . Altered mental status   . Coronary artery disease involving coronary bypass graft of native heart with unspecified angina pectoris   . Coronary artery disease involving native coronary artery of native heart without angina pectoris   . CKD (chronic kidney disease), stage III 10/17/2015  . Unstable angina (HCC) 10/16/2015  . Accelerating angina (HCC)   . Acute kidney injury (HCC) 08/02/2014  . AKI (acute kidney injury) (HCC) 08/01/2014  . Renal failure 08/01/2014  . Gastroenteritis, acute 08/01/2014  .  Diabetes mellitus type 2 in obese (HCC) 08/01/2014  . Abnormal nuclear stress test 07/11/2014  . Hypertension   . Essential hypertension   . Depression 05/08/2011  . CAD (coronary artery disease) 01/15/2011  . S/P CABG (coronary artery bypass graft) 10/03/2010  . Angina pectoris 10/03/2010  . Type II diabetes mellitus (HCC) 10/03/2010  . Dyslipidemia 10/03/2010  . Low back pain 10/03/2010  . Anxiety 10/03/2010  . Benign hypertensive heart disease without heart failure 10/03/2010    Past Surgical History:  Procedure Laterality Date  . APPENDECTOMY  ~ 1955  . BACK SURGERY    . CARDIAC CATHETERIZATION  2001; ~ 2010  . CARDIAC CATHETERIZATION N/A 10/16/2015   Procedure: Left Heart Cath and Cors/Grafts Angiography;  Surgeon: Corky Crafts, MD;  Location: [facility] INVASIVE CV LAB;  Service: Cardiovascular;  Laterality: N/A;  . CARDIOVASCULAR STRESS TEST  07/03/2009   EF 64%  . CHOLECYSTECTOMY    .  CORONARY ANGIOPLASTY WITH STENT PLACEMENT  March 2012   BMS to SVG to intermediate/OM  . CORONARY ANGIOPLASTY WITH STENT PLACEMENT  07/11/2014   "1"  . CORONARY ARTERY BYPASS GRAFT  2001   "CABG X 5"  . CORONARY ARTERY BYPASS GRAFT N/A 10/20/2015   Procedure: REDO CORONARY ARTERY BYPASS GRAFTING (CABG), TIMES THREE, USING LEFT GREATER SAPHENOUS VEIN HARVESTED ENDOSCOPICALLY AND CRYO VEIN, WITH CORONARY ENDARTERECTOMY;  Surgeon: Kerin Perna, MD;  Location: [facility] OR;  Service: Open Heart Surgery;  Laterality: N/A;  SVG to LAD, SVG to OM, CRYOVEIN to RCA with Endarterectomy  . ENDARTERECTOMY Right 10/20/2015   Procedure: ENDARTERECTOMY CAROTID;  Surgeon: Pryor Ochoa, MD;  Location: [facility] OR;  Service: Vascular;  Laterality: Right;  . LEFT HEART CATHETERIZATION WITH CORONARY ANGIOGRAM N/A 07/11/2014   Procedure: LEFT HEART CATHETERIZATION WITH CORONARY ANGIOGRAM;  Surgeon: Peter M Swaziland, MD;  Location: [facility] CATH LAB;  Service: Cardiovascular;  Laterality: N/A;  . LEFT HEART CATHETERIZATION WITH CORONARY/GRAFT ANGIOGRAM N/A 05/09/2011   Procedure: LEFT HEART CATHETERIZATION WITH Isabel Caprice;  Surgeon: Kathleene Hazel, MD;  Location: [facility] CATH LAB;  Service: Cardiovascular;  Laterality: N/A;  . LUMBAR LAMINECTOMY  01/2011  . SHOULDER ARTHROSCOPY W/ ROTATOR CUFF REPAIR Left 1990's  . TEE WITHOUT CARDIOVERSION N/A 10/20/2015   Procedure: TRANSESOPHAGEAL ECHOCARDIOGRAM (TEE);  Surgeon: Kerin Perna, MD;  Location: [facility] OR;  Service: Open Heart Surgery;  Laterality: N/A;  . TONSILLECTOMY AND ADENOIDECTOMY  1951       Home Medications    Prior to Admission medications   Medication Sig Start Date End Date Taking? Authorizing Provider  amLODipine (NORVASC) 5 MG tablet Take 0.5 tablets (2.5 mg total) by mouth daily. 01/10/16  Yes Vesta Mixer, MD  apixaban (ELIQUIS) 5 MG TABS tablet Take 1 tablet (5 mg total) by mouth 2 (two) times daily. 01/10/16  Yes Vesta Mixer, MD  Artificial  Tear Ointment (DRY EYES OP) Place 1 drop into both eyes daily as needed (for dry eyes).   Yes Historical Provider, MD  busPIRone (BUSPAR) 15 MG tablet Take 15 mg by mouth daily. 08/22/15  Yes Historical Provider, MD  carvedilol (COREG) 25 MG tablet Take 0.5 tablets (12.5 mg total) by mouth 2 (two) times daily with a meal. 01/10/16  Yes Vesta Mixer, MD  CYMBALTA 60 MG capsule Take 60 mg by mouth at bedtime.  03/30/12  Yes Historical Provider, MD  ezetimibe (ZETIA) 10 MG tablet Take 1 tablet (10 mg total) by mouth daily. 01/10/16  Yes  Vesta Mixer, MD  furosemide (LASIX) 40 MG tablet Take 1 tablet (40 mg total) by mouth daily. 01/10/16  Yes Vesta Mixer, MD  HYDROcodone-acetaminophen [facility]) 10-325 MG tablet Take 1 tablet by mouth every 6 (six) hours as needed for moderate pain. Patient taking differently: Take 0.5-2 tablets by mouth every 6 (six) hours as needed for moderate pain.  11/13/15  Yes Erin R Barrett, PA-C  insulin glargine (LANTUS) 100 UNIT/ML injection Inject 64 Units into the skin at bedtime.    Yes Historical Provider, MD  isosorbide mononitrate (IMDUR) 60 MG 24 hr tablet Take 60 mg by mouth daily.  10/11/15  Yes Historical Provider, MD  LINZESS 145 MCG CAPS capsule Take 145 mcg by mouth daily as needed (constipation).  09/28/15  Yes Historical Provider, MD  mirtazapine (REMERON) 15 MG tablet Take 15 mg by mouth at bedtime.  03/30/12  Yes Historical Provider, MD  nitroGLYCERIN (NITROSTAT) 0.4 MG SL tablet Place 1 tablet (0.4 mg total) under the tongue every 5 (five) minutes as needed for chest pain (up to 3 doses). 06/14/14 09/12/16 Yes Cassell Clement, MD  NOVOLOG FLEXPEN 100 UNIT/ML FlexPen Inject 24 Units into the skin 2 (two) times daily.  07/01/13  Yes Historical Provider, MD  pantoprazole (PROTONIX) 40 MG tablet Take 1 tablet (40 mg total) by mouth daily. Patient taking differently: Take 40 mg by mouth daily as needed (heartburn).  11/14/15  Yes Erin R Barrett, PA-C  potassium  chloride SA (K-DUR,KLOR-CON) 20 MEQ tablet Take 1 tablet (20 mEq total) by mouth daily. 01/10/16  Yes Vesta Mixer, MD  QUEtiapine (SEROQUEL) 400 MG tablet Take 200 mg by mouth at bedtime.    Yes Historical Provider, MD    Family History Family History  Problem Relation Age of Onset  . Heart disease Mother   . Heart disease Father   . Heart attack Father   . Heart disease Brother     Social History Social History  Substance Use Topics  . Smoking status: Former Smoker    Packs/day: 4.00    Years: 25.00    Types: Cigarettes  . Smokeless tobacco: Former Neurosurgeon    Types: Chew     Comment: "quit smoking ~ 1985; chewed off and on for a few years; quit chewing in ~ 1985 too"  . Alcohol use Yes     Comment: 07/11/2014 "might take a drink 5X/yr"     Allergies   Metformin and related; Pravachol; Ramipril; and Wellbutrin [bupropion hcl]   Review of Systems Review of Systems  Respiratory: Positive for shortness of breath.   Cardiovascular: Positive for chest pain.  All other systems reviewed and are negative.    Physical Exam Updated Vital Signs BP (!) 147/81   Pulse 100   Temp 98.3 F (36.8 C) (Oral)   Resp (!) 23   Ht 6\' 3"  (1.905 m)   Wt 290 lb (131.5 kg)   SpO2 96%   BMI 36.25 kg/m   Physical Exam  Constitutional: He is oriented to person, place, and time. He appears well-developed and well-nourished.  HENT:  Head: Normocephalic and atraumatic.  Right Ear: External ear normal.  Left Ear: External ear normal.  Nose: Nose normal.  Mouth/Throat: Oropharynx is clear and moist.  Eyes: Conjunctivae and EOM are normal. Pupils are equal, round, and reactive to light.  Neck: Normal range of motion. Neck supple.  Cardiovascular: Normal rate, regular rhythm, normal heart sounds and intact distal pulses.   Pulmonary/Chest: Effort  normal and breath sounds normal.  Abdominal: Soft. Bowel sounds are normal.  Musculoskeletal: Normal range of motion.  Neurological: He is  alert and oriented to person, place, and time.  Skin: Skin is warm and dry.  Psychiatric: He has a normal mood and affect. His behavior is normal. Judgment and thought content normal.  Nursing note and vitals reviewed.    ED Treatments / Results  Labs (all labs ordered are listed, but only abnormal results are displayed) Labs Reviewed  BASIC METABOLIC PANEL - Abnormal; Notable for the following:       Result Value   Chloride 100 (*)    Glucose, Bld 212 (*)    Creatinine, Ser 1.56 (*)    Calcium 8.7 (*)    GFR calc non Af Amer 43 (*)    GFR calc Af Amer 49 (*)    All other components within normal limits  CBC - Abnormal; Notable for the following:    Platelets 104 (*)    All other components within normal limits  TROPONIN I - Abnormal; Notable for the following:    Troponin I 1.54 (*)    All other components within normal limits  BRAIN NATRIURETIC PEPTIDE - Abnormal; Notable for the following:    B Natriuretic Peptide 124.3 (*)    All other components within normal limits  I-STAT TROPOININ, ED - Abnormal; Notable for the following:    Troponin i, poc 0.65 (*)    All other components within normal limits  PROTIME-INR    EKG  EKG Interpretation  Date/Time:  Thursday September 12 2016 16:23:41 EDT Ventricular Rate:  103 PR Interval:  184 QRS Duration: 120 QT Interval:  390 QTC Calculation: 510 R Axis:   43 Text Interpretation:  Sinus tachycardia Low voltage QRS Right bundle branch block Possible Inferior infarct , age undetermined Abnormal ECG No significant change since last tracing Confirmed by [facility] MD, Avon Molock ([id number]) on 09/12/2016 4:54:18 PM       Radiology Dg Chest 2 View  Result Date: 09/12/2016 CLINICAL DATA:  Chest pain. EXAM: CHEST  2 VIEW COMPARISON:  03/20/2016 . FINDINGS: Prior CABG. Cardiomegaly with normal pulmonary vascularity. No focal infiltrate. No pleural effusion or pneumothorax. Degenerative changes thoracic spine. IMPRESSION: 1.  Prior CABG.   Cardiomegaly. 2. No acute pulmonary disease . Electronically Signed   By: Maisie Fus  Register   On: 09/12/2016 16:50    Procedures Procedures (including critical care time)  Medications Ordered in ED Medications  nitroGLYCERIN (NITROSTAT) SL tablet 0.4 mg (0.4 mg Sublingual Given 09/12/16 1822)  nitroGLYCERIN 50 mg in dextrose 5 % 250 mL (0.2 mg/mL) infusion (5 mcg/min Intravenous New Bag/Given 09/12/16 2008)     Initial Impression / Assessment and Plan / ED Course  I have reviewed the triage vital signs and the nursing notes.  Pertinent labs & imaging results that were available during my care of the patient were reviewed by me and considered in my medical decision making (see chart for details).    A regular troponin was checked and it is higher than the troponin I-stat.  The pt's pain improved with nitro, but keeps coming back.  Pt placed on IV nitro.  He was d/w Dr. Royann Shivers (cardiology) who will see him in the ED.  Cardiology will admit.  They plan for cath in the morning.  Pt will also be started on IV heparin.    Final Clinical Impressions(s) / ED Diagnoses   Final diagnoses:  NSTEMI (non-ST elevated myocardial infarction) (  HCC)  Hyperglycemia  CRI (chronic renal insufficiency), stage 3 (moderate)    New Prescriptions New Prescriptions   No medications on file     Jacalyn Lefevre, MD 09/12/16 2024

## 2016-09-12 NOTE — ED Triage Notes (Addendum)
Pt reports left sided aching chest pain that radiates to his left arm, neck and his back between his shoulder blades, associated with SOB. He states this started 4-5 days ago but today has been worse. He also reports HA.

## 2016-09-12 NOTE — Progress Notes (Signed)
ANTICOAGULATION CONSULT NOTE - Initial Consult  Pharmacy Consult for Heparin  Indication: chest pain/ACS  Allergies  Allergen Reactions  . Metformin And Related Nausea And Vomiting  . Pravachol Nausea And Vomiting  . Ramipril Nausea Only  . Wellbutrin [Bupropion Hcl] Nausea And Vomiting    Patient Measurements: Height: 6\' 3"  (190.5 cm) Weight: (!) 302 lb 9.6 oz (137.3 kg) IBW/kg (Calculated) : 84.5 Heparin Dosing Weight: 133 kg  Vital Signs: Temp: 99 F (37.2 C) (03/22 2221) Temp Source: Oral (03/22 2221) BP: 168/66 (03/22 2221) Pulse Rate: 98 (03/22 2221)  Labs:  Recent Labs  09/12/16 1628 09/12/16 1735  HGB 14.8  --   HCT 45.2  --   PLT 104*  --   LABPROT 13.3  --   INR 1.01  --   CREATININE 1.56*  --   TROPONINI  --  1.54*    Estimated Creatinine Clearance: 63.9 mL/min (A) (by C-G formula based on SCr of 1.56 mg/dL (H)).   Medical History: Past Medical History:  Diagnosis Date  . Anemia   . Anxiety   . Arthritis    "back, neck" (07/11/2014)  . Chronic lower back pain   . Complication of anesthesia    "they have a hard time waking him up"  . Coronary artery disease    a. 2001: s/p CABG X4;  b. 08/2010 s/p PCI/BMS to VG->RI->OM;  c. 06/2014 Cath/PCI: LM nl, LAD 18032m, LCX 4065m, RI 100, OM1 100, OM2 80, small, RCA 100ost, VG->RCA 100, VG->Diag 100, VG->RI->OM1 70-80 ISR (4.0x24 Promus DES), LIMA->LAD nl.  . Depression   . Dyslipidemia   . Dysrhythmia   . Essential hypertension   . GERD (gastroesophageal reflux disease)   . History of blood transfusion 2001   "related to OHS"  . Morbid obesity (HCC)   . Nephrolithiasis   . RBBB   . Renal insufficiency    a. noted 06/2014 - baseline unclear as no data since 2012.  . Thrombocytopenia (HCC)    a. Borderline low platelets by labs noted in 06/2014 but also noted on prior labs as well.  . Type II diabetes mellitus (HCC)      Assessment: 70yom with Hx CAD s/p CABG 2017 with saddle PE 11/2015.  Recently held  apixaban for dental surgery, restarted today.  BL INR 1.01, no BL aptt or HL - assume HL elevated in setting of apixaban and will dos off of aPTT until they correlate.    Goal of Therapy:  Heparin level 0.3-0.7 units/ml Monitor platelets by anticoagulation protocol: Yes   Plan:  Bolus heparin 2500  Heparin drip 1400 uts/hr  APTT, HL, CBC daily   Leota SauersLisa Eveny Anastas Pharm.D. CPP, BCPS Clinical Pharmacist [phone] 09/12/2016 10:39 PM

## 2016-09-12 NOTE — H&P (Signed)
History & Physical    Patient ID: Tyler Le MRN: [id number], DOB/AGE: 1946-02-12   Admit date: 09/12/2016   Primary Physician: Kaleen MaskELKINS,WILSON OLIVER, MD Primary Cardiologist: Nahser  Patient Profile    71 yo male with PMH of CAD s/p redo CABG 4/17, ICM, HTN, HL, IDDM, and saddle PE who presented to the ED with reports of chest pain.   Past Medical History    Past Medical History:  Diagnosis Date  . Anemia   . Anxiety   . Arthritis    "back, neck" (07/11/2014)  . Chronic lower back pain   . Complication of anesthesia    "they have a hard time waking him up"  . Coronary artery disease    a. 2001: s/p CABG X4;  b. 08/2010 s/p PCI/BMS to VG->RI->OM;  c. 06/2014 Cath/PCI: LM nl, LAD 13358m, LCX 5754m, RI 100, OM1 100, OM2 80, small, RCA 100ost, VG->RCA 100, VG->Diag 100, VG->RI->OM1 70-80 ISR (4.0x24 Promus DES), LIMA->LAD nl.  . Depression   . Dyslipidemia   . Dysrhythmia   . Essential hypertension   . GERD (gastroesophageal reflux disease)   . History of blood transfusion 2001   "related to OHS"  . Morbid obesity (HCC)   . Nephrolithiasis   . RBBB   . Renal insufficiency    a. noted 06/2014 - baseline unclear as no data since 2012.  . Thrombocytopenia (HCC)    a. Borderline low platelets by labs noted in 06/2014 but also noted on prior labs as well.  . Type II diabetes mellitus (HCC)     Past Surgical History:  Procedure Laterality Date  . APPENDECTOMY  ~ 1955  . BACK SURGERY    . CARDIAC CATHETERIZATION  2001; ~ 2010  . CARDIAC CATHETERIZATION N/A 10/16/2015   Procedure: Left Heart Cath and Cors/Grafts Angiography;  Surgeon: Corky CraftsJayadeep S Varanasi, MD;  [facility];  Service: Cardiovascular;  Laterality: N/A;  . CARDIOVASCULAR STRESS TEST  07/03/2009   EF 64%  . CHOLECYSTECTOMY    . CORONARY ANGIOPLASTY WITH STENT PLACEMENT  March 2012   BMS to SVG to intermediate/OM  . CORONARY ANGIOPLASTY WITH STENT PLACEMENT  07/11/2014   "1"  . CORONARY ARTERY  BYPASS GRAFT  2001   "CABG X 5"  . CORONARY ARTERY BYPASS GRAFT N/A 10/20/2015   Procedure: REDO CORONARY ARTERY BYPASS GRAFTING (CABG), TIMES THREE, USING LEFT GREATER SAPHENOUS VEIN HARVESTED ENDOSCOPICALLY AND CRYO VEIN, WITH CORONARY ENDARTERECTOMY;  Surgeon: Kerin PernaPeter Van Trigt, MD;  Location: [facility] OR;  Service: Open Heart Surgery;  Laterality: N/A;  SVG to LAD, SVG to OM, CRYOVEIN to RCA with Endarterectomy  . ENDARTERECTOMY Right 10/20/2015   Procedure: ENDARTERECTOMY CAROTID;  Surgeon: Pryor OchoaJames D Lawson, MD;  [facility];  Service: Vascular;  Laterality: Right;  . LEFT HEART CATHETERIZATION WITH CORONARY ANGIOGRAM N/A 07/11/2014   Procedure: LEFT HEART CATHETERIZATION WITH CORONARY ANGIOGRAM;  Surgeon: Peter M SwazilandJordan, MD;  [facility];  Service: Cardiovascular;  Laterality: N/A;  . LEFT HEART CATHETERIZATION WITH CORONARY/GRAFT ANGIOGRAM N/A 05/09/2011   Procedure: LEFT HEART CATHETERIZATION WITH Isabel CapriceORONARY/GRAFT ANGIOGRAM;  Surgeon: Kathleene Hazelhristopher D McAlhany, MD;  [facility];  Service: Cardiovascular;  Laterality: N/A;  . LUMBAR LAMINECTOMY  01/2011  . SHOULDER ARTHROSCOPY W/ ROTATOR CUFF REPAIR Left 1990's  . TEE WITHOUT CARDIOVERSION N/A 10/20/2015   Procedure: TRANSESOPHAGEAL ECHOCARDIOGRAM (TEE);  Surgeon: Kerin PernaPeter Van Trigt, MD;  [facility];  Service: Open Heart Surgery;  Laterality: N/A;  .  TONSILLECTOMY AND ADENOIDECTOMY  1951     Allergies  Allergies  Allergen Reactions  . Metformin And Related Nausea And Vomiting  . Pravachol Nausea And Vomiting  . Ramipril Nausea Only  . Wellbutrin [Bupropion Hcl] Nausea And Vomiting    History of Present Illness    Tyler Le is a 71 yo male with extensive cardiac hx. He originally underwent 4v CABG in 2001. In then in march of 2012 had a BMS placed to the SVG to the intermediate and obtuse marginal branch. Was seen again in 12/15 reporting chest pain. Had a lexiscan myoview that showed reversible defect along the mid to distal  anterior wall. Cardiac catheterization was recommended at that time. In 1/16 he underwent LHC with Dr. Swaziland with occluded SVG to diag, SVG to RCA, restenosis in the BMS to ramus/OM with new DES placed to this area. Placed on ASA and Brilinta indefinitely.   In 4/17 he reported ongoing exertional intrascapular pain and severe DOE. Work up initially negative, but presented back and underwent redo CABG in 5/17. This was complicated by respiratory failure, requiring a trach. Was then hospitalized again in 6/17 with a saddle PE. Was placed on Eliquis 5mg  BID. Was last seen in the office on 10/17 by Dr. Elease Hashimoto. He reported being back in his usual state of health. His medications were continued the same.   Developed exertional chest pain about a week ago along with dyspnea on exertion. Reports this is the same pain he has experienced with previous cardiac events. States he recently had some dental work done and was off his Eliquis for the past 7 days. He called the office today with his symptoms and was encouraged to come to the ED for further work up.   In the ED his labs showed stable electrolytes, Trop 0.65>>1.54, BNP 124, Hgb 14.8. CXR negative. EKG showed SR RBBB with new ST depression in v2 v3. He was taking SL nitro at home that relieved his pain for short periods of time. In the ED was given 3 SL nitro, currently chest pain free.   Home Medications    Prior to Admission medications   Medication Sig Start Date End Date Taking? Authorizing Provider  amLODipine (NORVASC) 5 MG tablet Take 0.5 tablets (2.5 mg total) by mouth daily. 01/10/16  Yes Vesta Mixer, MD  apixaban (ELIQUIS) 5 MG TABS tablet Take 1 tablet (5 mg total) by mouth 2 (two) times daily. 01/10/16  Yes Vesta Mixer, MD  Artificial Tear Ointment (DRY EYES OP) Place 1 drop into both eyes daily as needed (for dry eyes).   Yes Historical Provider, MD  busPIRone (BUSPAR) 15 MG tablet Take 15 mg by mouth daily. 08/22/15  Yes Historical  Provider, MD  carvedilol (COREG) 25 MG tablet Take 0.5 tablets (12.5 mg total) by mouth 2 (two) times daily with a meal. 01/10/16  Yes Vesta Mixer, MD  CYMBALTA 60 MG capsule Take 60 mg by mouth at bedtime.  03/30/12  Yes Historical Provider, MD  ezetimibe (ZETIA) 10 MG tablet Take 1 tablet (10 mg total) by mouth daily. 01/10/16  Yes Vesta Mixer, MD  furosemide (LASIX) 40 MG tablet Take 1 tablet (40 mg total) by mouth daily. 01/10/16  Yes Vesta Mixer, MD  HYDROcodone-acetaminophen [facility]) 10-325 MG tablet Take 1 tablet by mouth every 6 (six) hours as needed for moderate pain. Patient taking differently: Take 0.5-2 tablets by mouth every 6 (six) hours as needed for moderate pain.  11/13/15  Yes Erin R Barrett, PA-C  insulin glargine (LANTUS) 100 UNIT/ML injection Inject 64 Units into the skin at bedtime.    Yes Historical Provider, MD  isosorbide mononitrate (IMDUR) 60 MG 24 hr tablet Take 60 mg by mouth daily.  10/11/15  Yes Historical Provider, MD  LINZESS 145 MCG CAPS capsule Take 145 mcg by mouth daily as needed (constipation).  09/28/15  Yes Historical Provider, MD  mirtazapine (REMERON) 15 MG tablet Take 15 mg by mouth at bedtime.  03/30/12  Yes Historical Provider, MD  nitroGLYCERIN (NITROSTAT) 0.4 MG SL tablet Place 1 tablet (0.4 mg total) under the tongue every 5 (five) minutes as needed for chest pain (up to 3 doses). 06/14/14 09/12/16 Yes Cassell Clement, MD  NOVOLOG FLEXPEN 100 UNIT/ML FlexPen Inject 24 Units into the skin 2 (two) times daily.  07/01/13  Yes Historical Provider, MD  pantoprazole (PROTONIX) 40 MG tablet Take 1 tablet (40 mg total) by mouth daily. Patient taking differently: Take 40 mg by mouth daily as needed (heartburn).  11/14/15  Yes Erin R Barrett, PA-C  potassium chloride SA (K-DUR,KLOR-CON) 20 MEQ tablet Take 1 tablet (20 mEq total) by mouth daily. 01/10/16  Yes Vesta Mixer, MD  QUEtiapine (SEROQUEL) 400 MG tablet Take 200 mg by mouth at bedtime.    Yes Historical  Provider, MD    Family History    Family History  Problem Relation Age of Onset  . Heart disease Mother   . Heart disease Father   . Heart attack Father   . Heart disease Brother     Social History    Social History   Social History  . Marital status: Divorced    Spouse name: N/A  . Number of children: N/A  . Years of education: N/A   Occupational History  . Not on file.   Social History Main Topics  . Smoking status: Former Smoker    Packs/day: 4.00    Years: 25.00    Types: Cigarettes  . Smokeless tobacco: Former Neurosurgeon    Types: Chew     Comment: "quit smoking ~ 1985; chewed off and on for a few years; quit chewing in ~ 1985 too"  . Alcohol use Yes     Comment: 07/11/2014 "might take a drink 5X/yr"  . Drug use: No  . Sexual activity: No   Other Topics Concern  . Not on file   Social History Narrative  . No narrative on file     Review of Systems    General:  No chills, fever, night sweats ++ weight changes.  Cardiovascular:  See HPI Dermatological: No rash, lesions/masses Respiratory: No cough, ++ dyspnea Urologic: No hematuria, dysuria Abdominal:   No nausea, vomiting, diarrhea, bright red blood per rectum, melena, or hematemesis Neurologic:  No visual changes, wkns, changes in mental status. All other systems reviewed and are otherwise negative except as noted above.  Physical Exam    Blood pressure (!) 147/81, pulse 100, temperature 98.3 F (36.8 C), temperature source Oral, resp. rate (!) 23, height 6\' 3"  (1.905 m), weight 290 lb (131.5 kg), SpO2 96 %.  General: Pleasant older WM, NAD Psych: Normal affect. Neuro: Alert and oriented X 3. Moves all extremities spontaneously. HEENT: Normal  Neck: Supple without bruits or JVD. Lungs:  Resp regular and unlabored, CTA. Heart: tachy, split s2 no s3, s4, or murmurs. Abdomen: Soft, non-tender, non-distended, BS + x 4.  Extremities: No clubbing, cyanosis or edema. DP/PT/Radials 2+ and equal  bilaterally.  Labs    Troponin [facility] Test)  Recent Labs  09/12/16 1640  TROPIPOC 0.65*    Recent Labs  09/12/16 1735  TROPONINI 1.54*   Lab Results  Component Value Date   WBC 6.3 09/12/2016   HGB 14.8 09/12/2016   HCT 45.2 09/12/2016   MCV 95.6 09/12/2016   PLT 104 (L) 09/12/2016    Recent Labs Lab 09/12/16 1628  NA 140  K 4.8  CL 100*  CO2 29  BUN 16  CREATININE 1.56*  CALCIUM 8.7*  GLUCOSE 212*   Lab Results  Component Value Date   CHOL 130 05/09/2011   HDL 27 (L) 05/09/2011   LDLCALC 31 05/09/2011   TRIG 130 11/01/2015   No results found for: [facility]   Radiology Studies    Dg Chest 2 View  Result Date: 09/12/2016 CLINICAL DATA:  Chest pain. EXAM: CHEST  2 VIEW COMPARISON:  03/20/2016 . FINDINGS: Prior CABG. Cardiomegaly with normal pulmonary vascularity. No focal infiltrate. No pleural effusion or pneumothorax. Degenerative changes thoracic spine. IMPRESSION: 1.  Prior CABG.  Cardiomegaly. 2. No acute pulmonary disease . Electronically Signed   By: Maisie Fus  Register   On: 09/12/2016 16:50    ECG & Cardiac Imaging    EKG: SR RBBB, with new ST depression in leads v2,v3  Echo: 6/17  Study Conclusions  - Left ventricle: The cavity size was normal. Wall thickness was   increased in a pattern of moderate LVH. Systolic function was   normal. The estimated ejection fraction was in the range of 55%   to 60%. Wall motion was normal; there were no regional wall   motion abnormalities. Features are consistent with a pseudonormal   left ventricular filling pattern, with concomitant abnormal   relaxation and increased filling pressure (grade 2 diastolic   dysfunction). - Aortic valve: Trileaflet; moderately thickened, moderately   calcified leaflets. - Aorta: Aortic root dimension: 38 mm (ED). - Ascending aorta: The ascending aorta was mildly dilated. - Right ventricle: The cavity size was mildly dilated. Wall   thickness was normal. Systolic  function was mildly reduced. - Right atrium: The atrium was mildly dilated. - Tricuspid valve: There was moderate regurgitation. - Pulmonary arteries: Systolic pressure was moderately increased.   PA peak pressure: 52 mm Hg (S).  Impressions:  - Compared to the prior study, there has been no significant   interval change  Assessment & Plan    71 yo male with PMH of CAD s/p redo CABG 4/17, ICM, HTN, HL, IDDM, and saddle PE who presented to the ED with reports of chest pain.  1. NSTEMI: Reports symptoms started several days ago, and seem to have progressed. Worsening dyspnea on exertion. EKG showed SR with RBBB, and new ST depression in v2, v3. Trop with ACS trend.  -- Will plan for cardiac catheterization in the morning. His Eliquis has been held for dental surgery. Will start IV heparin --cycle enzymes -- The patient understands that risks included but are not limited to stroke (1 in 1000), death (1 in 1000), kidney failure [usually temporary] (1 in 500), bleeding (1 in 200), allergic reaction [possibly serious] (1 in 200).  -- currently pain free, therefore will hold on IV nitro.   2. HTN: Borderline controlled in the ED. Will continue home regimen and follow.   3. HL: need to clarify which statins he has tried and was intolerant of. Strongly consider PCSK9 inh.  4. PE: On eliquis, but held prior  to admission.  -- IV heparin  5. Hx of ICM: Normal EF on last echo.  6. IDDM: SSI  Signed, Laverda Page, NP-C Pager [phone] 09/12/2016, 7:45 PM  I have seen and examined the patient along with Laverda Page, NP-C.  I have reviewed the chart, notes and new data.  I agree with NP's note.  Key new complaints: typical complaints of unstable angina Key examination changes: mild symmetrical edema and dyspnea, but at most mild hypervolemia by exam. Resting tachycardia, No other physical signs of DVT or PE. Key new findings / data: abnormal ECG with new ST depression  V5-V6  PLAN: Symptoms are highly compatible with his previous angina syndrome and there is ECG and biochemical evidence of myocardial infarction. Findings are overwhelmingly supportive of NSTEMI.  Since he has a history of thromboembolic disease and has been off anticoagulation for the last 4 days, he is at risk for recurrent DVT/PE which could also be responsible for ECG changes and mild elevation in troponin. Clinically however, his presentation is much more suggestive of an acute coronary insufficiency event.  Either way will place on IV heparin.  Plan coronary angiography and possible PCI in AM. This procedure has been fully reviewed with the patient and written informed consent has been obtained.  He would not be a candidate for a 3rd CABG. Last surgery was followed by multiple serious complications and very long recovery. Will start clopidogrel since he will need to restart anticoagulation..  Continue beta blocker and zetia, reevaluate possible statin use versus PCSK9.  Thurmon Fair, MD, [facility] [phone] 09/12/2016, 8:17 PM

## 2016-09-13 ENCOUNTER — Inpatient Hospital Stay (HOSPITAL_COMMUNITY)
Admission: EM | Disposition: A | Discharge status: Home / Self Care | Payer: Self-pay | Source: Home / Self Care | Attending: Cardiovascular Disease

## 2016-09-13 DIAGNOSIS — I25110 Atherosclerotic heart disease of native coronary artery with unstable angina pectoris: Secondary | ICD-10-CM

## 2016-09-13 HISTORY — PX: LEFT HEART CATH AND CORS/GRAFTS ANGIOGRAPHY: CATH118250

## 2016-09-13 LAB — BASIC METABOLIC PANEL
Anion gap: 10 (ref 5–15)
BUN: 13 mg/dL (ref 6–20)
CALCIUM: 8.3 mg/dL — AB (ref 8.9–10.3)
CHLORIDE: 98 mmol/L — AB (ref 101–111)
CO2: 30 mmol/L (ref 22–32)
CREATININE: 1.48 mg/dL — AB (ref 0.61–1.24)
GFR calc Af Amer: 53 mL/min — ABNORMAL LOW (ref >60)
GFR calc non Af Amer: 46 mL/min — ABNORMAL LOW (ref >60)
GLUCOSE: 135 mg/dL — AB (ref 65–99)
Potassium: 3.5 mmol/L (ref 3.5–5.1)
Sodium: 138 mmol/L (ref 135–145)

## 2016-09-13 LAB — LIPID PANEL
CHOLESTEROL: 129 mg/dL (ref 0–200)
HDL: 26 mg/dL — ABNORMAL LOW (ref >40)
LDL CALC: 43 mg/dL (ref 0–99)
Total CHOL/HDL Ratio: 5 RATIO
Triglycerides: 299 mg/dL — ABNORMAL HIGH (ref <150)
VLDL: 60 mg/dL — AB (ref 0–40)

## 2016-09-13 LAB — TROPONIN I
TROPONIN I: 2.09 ng/mL — AB (ref <0.03)
TROPONIN I: 2.26 ng/mL — AB (ref <0.03)
Troponin I: 1.81 ng/mL (ref <0.03)

## 2016-09-13 LAB — GLUCOSE, CAPILLARY
GLUCOSE-CAPILLARY: 236 mg/dL — AB (ref 65–99)
Glucose-Capillary: 148 mg/dL — ABNORMAL HIGH (ref 65–99)
Glucose-Capillary: 169 mg/dL — ABNORMAL HIGH (ref 65–99)

## 2016-09-13 LAB — HEPARIN LEVEL (UNFRACTIONATED): Heparin Unfractionated: 1.12 IU/mL — ABNORMAL HIGH (ref 0.30–0.70)

## 2016-09-13 LAB — APTT
APTT: 50 s — AB (ref 24–36)
APTT: 62 s — AB (ref 24–36)

## 2016-09-13 SURGERY — LEFT HEART CATH AND CORS/GRAFTS ANGIOGRAPHY
Anesthesia: LOCAL

## 2016-09-13 MED ORDER — MIDAZOLAM HCL 2 MG/2ML IJ SOLN
INTRAMUSCULAR | Status: DC | PRN
  Administered 2016-09-13: 1 mg via INTRAVENOUS

## 2016-09-13 MED ORDER — FENTANYL CITRATE (PF) 100 MCG/2ML IJ SOLN
INTRAMUSCULAR | Status: DC | PRN
  Administered 2016-09-13 (×2): 25 ug via INTRAVENOUS

## 2016-09-13 MED ORDER — IOPAMIDOL (ISOVUE-370) INJECTION 76%
INTRAVENOUS | Status: DC | PRN
  Administered 2016-09-13: 195 mL

## 2016-09-13 MED ORDER — LIDOCAINE HCL (PF) 1 % IJ SOLN
INTRAMUSCULAR | Status: AC
Start: 2016-09-13 — End: 2016-09-13
  Filled 2016-09-13: qty 30

## 2016-09-13 MED ORDER — VERAPAMIL HCL 2.5 MG/ML IV SOLN
INTRAVENOUS | Status: AC
  Filled 2016-09-13: qty 2

## 2016-09-13 MED ORDER — SODIUM CHLORIDE 0.9% FLUSH: 3.0000 mL | INTRAVENOUS | Status: DC | PRN

## 2016-09-13 MED ORDER — HEPARIN SODIUM (PORCINE) 1000 UNIT/ML IJ SOLN
INTRAMUSCULAR | Status: AC
  Filled 2016-09-13: qty 1

## 2016-09-13 MED ORDER — MIDAZOLAM HCL 2 MG/2ML IJ SOLN
INTRAMUSCULAR | Status: AC
  Filled 2016-09-13: qty 2

## 2016-09-13 MED ORDER — SODIUM CHLORIDE 0.9% FLUSH
3.0000 mL | Freq: Two times a day (BID) | INTRAVENOUS | Status: DC
  Administered 2016-09-13 – 2016-09-17 (×6): 3 mL via INTRAVENOUS

## 2016-09-13 MED ORDER — SODIUM CHLORIDE 0.9 % IV SOLN: INTRAVENOUS | Status: AC

## 2016-09-13 MED ORDER — DIPHENHYDRAMINE HCL 25 MG PO CAPS
25.0000 mg | ORAL_CAPSULE | Freq: Once | ORAL | Status: AC
  Administered 2016-09-13: 25 mg via ORAL
  Filled 2016-09-13: qty 1

## 2016-09-13 MED ORDER — SODIUM CHLORIDE 0.9 % IV SOLN: 250.0000 mL | INTRAVENOUS | Status: DC | PRN

## 2016-09-13 MED ORDER — HEPARIN SODIUM (PORCINE) 1000 UNIT/ML IJ SOLN
INTRAMUSCULAR | Status: DC | PRN
  Administered 2016-09-13: 5000 [IU] via INTRAVENOUS

## 2016-09-13 MED ORDER — MORPHINE SULFATE (PF) 2 MG/ML IV SOLN
2.0000 mg | INTRAVENOUS | Status: DC | PRN
  Administered 2016-09-15 – 2016-09-18 (×2): 2 mg via INTRAVENOUS
  Filled 2016-09-13 (×3): qty 1

## 2016-09-13 MED ORDER — HEPARIN (PORCINE) IN NACL 2-0.9 UNIT/ML-% IJ SOLN
INTRAMUSCULAR | Status: DC | PRN
  Administered 2016-09-13: 10 mL via INTRA_ARTERIAL

## 2016-09-13 MED ORDER — LIDOCAINE HCL (PF) 1 % IJ SOLN
INTRAMUSCULAR | Status: DC | PRN
  Administered 2016-09-13: 3 mL

## 2016-09-13 MED ORDER — HEPARIN (PORCINE) IN NACL 2-0.9 UNIT/ML-% IJ SOLN
INTRAMUSCULAR | Status: AC
  Filled 2016-09-13: qty 1000

## 2016-09-13 MED ORDER — ZOLPIDEM TARTRATE 5 MG PO TABS
2.5000 mg | ORAL_TABLET | Freq: Once | ORAL | Status: DC
  Filled 2016-09-13: qty 1

## 2016-09-13 MED ORDER — HEPARIN (PORCINE) IN NACL 100-0.45 UNIT/ML-% IJ SOLN
1850.0000 [IU]/h | INTRAMUSCULAR | Status: DC
  Administered 2016-09-13: 1850 [IU]/h via INTRAVENOUS
  Filled 2016-09-13: qty 250

## 2016-09-13 MED ORDER — FENTANYL CITRATE (PF) 100 MCG/2ML IJ SOLN
INTRAMUSCULAR | Status: AC
Start: 2016-09-13 — End: 2016-09-13
  Filled 2016-09-13: qty 2

## 2016-09-13 MED ORDER — HEPARIN (PORCINE) IN NACL 100-0.45 UNIT/ML-% IJ SOLN
1850.0000 [IU]/h | INTRAMUSCULAR | Status: DC
  Administered 2016-09-14 – 2016-09-15 (×3): 1850 [IU]/h via INTRAVENOUS
  Filled 2016-09-13 (×3): qty 250

## 2016-09-13 SURGICAL SUPPLY — 16 items
CATH INFINITI 5 FR IM (CATHETERS) ×1 IMPLANT
CATH INFINITI 5FR ANG PIGTAIL (CATHETERS) ×1 IMPLANT
CATH INFINITI 5FR JL4 (CATHETERS) ×1 IMPLANT
CATH INFINITI JR4 5F (CATHETERS) ×1 IMPLANT
COVER PRB 48X5XTLSCP FOLD TPE (BAG) IMPLANT
COVER PROBE 5X48 (BAG) ×2
DEVICE RAD COMP TR BAND LRG (VASCULAR PRODUCTS) ×1 IMPLANT
GLIDESHEATH SLEND A-KIT 6F 22G (SHEATH) ×1 IMPLANT
GUIDEWIRE INQWIRE 1.5J.035X260 (WIRE) IMPLANT
HOVERMATT SINGLE USE (MISCELLANEOUS) ×1 IMPLANT
INQWIRE 1.5J .035X260CM (WIRE) ×2
KIT HEART LEFT (KITS) ×2 IMPLANT
PACK CARDIAC CATHETERIZATION (CUSTOM PROCEDURE TRAY) ×2 IMPLANT
SYR MEDRAD MARK V 150ML (SYRINGE) ×2 IMPLANT
TRANSDUCER W/STOPCOCK (MISCELLANEOUS) ×2 IMPLANT
TUBING CIL FLEX 10 FLL-RA (TUBING) ×2 IMPLANT

## 2016-09-13 NOTE — Telephone Encounter (Signed)
Agree with plans for patient to go to hospital

## 2016-09-13 NOTE — H&P (View-Only) (Signed)
Pt with known CAD s/p redo CABG admitted with unstable angina. Eliquis has been held for last 24 hours. Labs reviewed this am. He has mild renal insufficiency. Peak troponin 2.09 and now trending down. Given his presentation, cardiac cath is indicated. Plan cardiac cath this afternoon. He has been loaded with Plavix. Continue ASA, beta blocker, Imdur. Continue IV heparin. Of note, his Eliquis was held for one week for dental procedure. He took one dose yesterday and none since.   BMET    Component Value Date/Time   NA 138 09/13/2016 0345   K 3.5 09/13/2016 0345   CL 98 (L) 09/13/2016 0345   CO2 30 09/13/2016 0345   GLUCOSE 135 (H) 09/13/2016 0345   BUN 13 09/13/2016 0345   CREATININE 1.48 (H) 09/13/2016 0345   CREATININE 1.16 01/31/2016 1014   CALCIUM 8.3 (L) 09/13/2016 0345   GFRNONAA 46 (L) 09/13/2016 0345   GFRAA 53 (L) 09/13/2016 0345   CBC    Component Value Date/Time   WBC 6.3 09/12/2016 1628   RBC 4.73 09/12/2016 1628   HGB 14.8 09/12/2016 1628   HCT 45.2 09/12/2016 1628   PLT 104 (L) 09/12/2016 1628   MCV 95.6 09/12/2016 1628   MCH 31.3 09/12/2016 1628   MCHC 32.7 09/12/2016 1628   RDW 14.0 09/12/2016 1628   LYMPHSABS 1.3 11/10/2015 0445   MONOABS 0.9 11/10/2015 0445   EOSABS 0.2 11/10/2015 0445   BASOSABS 0.0 11/10/2015 0445   Pt has no questions about his cardiac cath today.   Tyler Le 09/13/2016 10:08 AM

## 2016-09-13 NOTE — Progress Notes (Signed)
Pt with known CAD s/p redo CABG admitted with unstable angina. Eliquis has been held for last 24 hours. Labs reviewed this am. He has mild renal insufficiency. Peak troponin 2.09 and now trending down. Given his presentation, cardiac cath is indicated. Plan cardiac cath this afternoon. He has been loaded with Plavix. Continue ASA, beta blocker, Imdur. Continue IV heparin. Of note, his Eliquis was held for one week for dental procedure. He took one dose yesterday and none since.   BMET    Component Value Date/Time   NA 138 09/13/2016 0345   K 3.5 09/13/2016 0345   CL 98 (L) 09/13/2016 0345   CO2 30 09/13/2016 0345   GLUCOSE 135 (H) 09/13/2016 0345   BUN 13 09/13/2016 0345   CREATININE 1.48 (H) 09/13/2016 0345   CREATININE 1.16 01/31/2016 1014   CALCIUM 8.3 (L) 09/13/2016 0345   GFRNONAA 46 (L) 09/13/2016 0345   GFRAA 53 (L) 09/13/2016 0345   CBC    Component Value Date/Time   WBC 6.3 09/12/2016 1628   RBC 4.73 09/12/2016 1628   HGB 14.8 09/12/2016 1628   HCT 45.2 09/12/2016 1628   PLT 104 (L) 09/12/2016 1628   MCV 95.6 09/12/2016 1628   MCH 31.3 09/12/2016 1628   MCHC 32.7 09/12/2016 1628   RDW 14.0 09/12/2016 1628   LYMPHSABS 1.3 11/10/2015 0445   MONOABS 0.9 11/10/2015 0445   EOSABS 0.2 11/10/2015 0445   BASOSABS 0.0 11/10/2015 0445   Pt has no questions about his cardiac cath today.   Christopher McAlhany 09/13/2016 10:08 AM  

## 2016-09-13 NOTE — Interval H&P Note (Signed)
History and Physical Interval Note:  09/13/2016 5:21 PM  Clide DeutscherDwight E Lalone  has presented today for surgery, with the diagnosis of unstable angina / NSTEMI The various methods of treatment have been discussed with the patient and family. After consideration of risks, benefits and other options for treatment, the patient has consented to  Procedure(s): Left Heart Cath and Cors/Grafts Angiography (N/A) with possible Percutaneous Coronary Intervention as a surgical intervention .  The patient's history has been reviewed, patient examined, no change in status, stable for surgery.  I have reviewed the patient's chart and labs.  Questions were answered to the patient's satisfaction.     Cath Lab Visit (complete for each Cath Lab visit)  Clinical Evaluation Leading to the Procedure:   ACS: Yes.    Non-ACS:    Anginal Classification: CCS IV  Anti-ischemic medical therapy: Maximal Therapy (2 or more classes of medications)  Non-Invasive Test Results: No non-invasive testing performed  Prior CABG: Previous CABG - with Re-do CABG    Bryan Lemmaavid Joyanne Eddinger

## 2016-09-13 NOTE — Plan of Care (Signed)
Problem: Safety: Goal: Ability to remain free from injury will improve Outcome: Progressing Pt educated on importance of safety during shift.   Problem: Pain Managment: Goal: General experience of comfort will improve Outcome: Progressing Pt has no complaints of pain at this time. Pt educated to call nurse in pain.

## 2016-09-13 NOTE — Progress Notes (Signed)
ANTICOAGULATION CONSULT NOTE - Follow Up Consult  Pharmacy Consult for heparin Indication: NSTEMI and h/o PE  Labs:  Recent Labs  09/12/16 1628 09/12/16 1735 09/12/16 2317 09/13/16 0345  HGB 14.8  --   --   --   HCT 45.2  --   --   --   PLT 104*  --   --   --   APTT  --   --   --  50*  LABPROT 13.3  --   --   --   INR 1.01  --   --   --   HEPARINUNFRC  --   --   --  1.12*  CREATININE 1.56*  --   --  1.48*  TROPONINI  --  1.54* 2.09* 1.81*    Assessment: 71yo male subtherapeutic on heparin with initial dosing while Eliquis on hold.  Goal of Therapy:  aPTT 66-102 seconds   Plan:  Will increase heparin gtt by 2-3 units/kg/hr to 1700 units/hr and check PTT with next lab draw.  Vernard GamblesVeronda Darwyn Ponzo, PharmD, BCPS  09/13/2016,5:26 AM

## 2016-09-13 NOTE — Progress Notes (Signed)
ANTICOAGULATION CONSULT NOTE - Follow Up Consult  Pharmacy Consult for heparin Indication: NSTEMI and h/o PE    Allergies  Allergen Reactions  . Metformin And Related Nausea And Vomiting  . Pravachol Nausea And Vomiting  . Ramipril Nausea Only  . Wellbutrin [Bupropion Hcl] Nausea And Vomiting    Patient Measurements: Height: 6\' 3"  (190.5 cm) Weight: (!) 302 lb 9.6 oz (137.3 kg) IBW/kg (Calculated) : 84.5 Heparin Dosing Weight: 115 kg  Vital Signs: Temp: 98.6 F (37 C) (03/23 0614) Temp Source: Oral (03/23 0614) BP: 123/77 (03/23 16100614) Pulse Rate: 105 (03/23 0614)  Labs:  Recent Labs  09/12/16 1628  09/12/16 2317 09/13/16 0345 09/13/16 1031  HGB 14.8  --   --   --   --   HCT 45.2  --   --   --   --   PLT 104*  --   --   --   --   APTT  --   --   --  50* 62*  LABPROT 13.3  --   --   --   --   INR 1.01  --   --   --   --   HEPARINUNFRC  --   --   --  1.12*  --   CREATININE 1.56*  --   --  1.48*  --   TROPONINI  --   < > 2.09* 1.81* 2.26*  < > = values in this interval not displayed.  Estimated Creatinine Clearance: 67.4 mL/min (A) (by C-G formula based on SCr of 1.48 mg/dL (H)).   Medical History: Past Medical History:  Diagnosis Date  . Anemia   . Anxiety   . Arthritis    "back, neck" (07/11/2014)  . Chronic lower back pain   . Complication of anesthesia    "they have a hard time waking him up"  . Coronary artery disease    a. 2001: s/p CABG X4;  b. 08/2010 s/p PCI/BMS to VG->RI->OM;  c. 06/2014 Cath/PCI: LM nl, LAD 13925m, LCX 5773m, RI 100, OM1 100, OM2 80, small, RCA 100ost, VG->RCA 100, VG->Diag 100, VG->RI->OM1 70-80 ISR (4.0x24 Promus DES), LIMA->LAD nl.  . Depression   . Dyslipidemia   . Dysrhythmia   . Essential hypertension   . GERD (gastroesophageal reflux disease)   . History of blood transfusion 2001   "related to OHS"  . Morbid obesity (HCC)   . Nephrolithiasis   . RBBB   . Renal insufficiency    a. noted 06/2014 - baseline unclear as no data  since 2012.  . Thrombocytopenia (HCC)    a. Borderline low platelets by labs noted in 06/2014 but also noted on prior labs as well.  . Type II diabetes mellitus (HCC)     Medications:  Prescriptions Prior to Admission  Medication Sig Dispense Refill Last Dose  . amLODipine (NORVASC) 5 MG tablet Take 0.5 tablets (2.5 mg total) by mouth daily. 45 tablet 3 09/12/2016 at Unknown time  . apixaban (ELIQUIS) 5 MG TABS tablet Take 1 tablet (5 mg total) by mouth 2 (two) times daily. 60 tablet 11 09/12/2016 at 1030  . Artificial Tear Ointment (DRY EYES OP) Place 1 drop into both eyes daily as needed (for dry eyes).   Past Week at Unknown time  . busPIRone (BUSPAR) 15 MG tablet Take 15 mg by mouth daily.   09/12/2016 at Unknown time  . carvedilol (COREG) 25 MG tablet Take 0.5 tablets (12.5 mg total) by mouth 2 (  two) times daily with a meal. 60 tablet 11 09/12/2016 at 1030  . CYMBALTA 60 MG capsule Take 60 mg by mouth at bedtime.    09/11/2016 at Unknown time  . ezetimibe (ZETIA) 10 MG tablet Take 1 tablet (10 mg total) by mouth daily. 30 tablet 11 09/12/2016 at Unknown time  . furosemide (LASIX) 40 MG tablet Take 1 tablet (40 mg total) by mouth daily. 30 tablet 11 09/12/2016 at Unknown time  . HYDROcodone-acetaminophen (NORCO) 10-325 MG tablet Take 1 tablet by mouth every 6 (six) hours as needed for moderate pain. (Patient taking differently: Take 0.5-2 tablets by mouth every 6 (six) hours as needed for moderate pain. ) 30 tablet 0 09/12/2016 at Unknown time  . insulin glargine (LANTUS) 100 UNIT/ML injection Inject 64 Units into the skin at bedtime.    09/11/2016 at Unknown time  . isosorbide mononitrate (IMDUR) 60 MG 24 hr tablet Take 60 mg by mouth daily.    09/12/2016 at Unknown time  . LINZESS 145 MCG CAPS capsule Take 145 mcg by mouth daily as needed (constipation).    Past Week at Unknown time  . mirtazapine (REMERON) 15 MG tablet Take 15 mg by mouth at bedtime.    09/11/2016 at Unknown time  . nitroGLYCERIN  (NITROSTAT) 0.4 MG SL tablet Place 1 tablet (0.4 mg total) under the tongue every 5 (five) minutes as needed for chest pain (up to 3 doses). 100 tablet 4 09/12/2016 at Unknown time  . NOVOLOG FLEXPEN 100 UNIT/ML FlexPen Inject 24 Units into the skin 2 (two) times daily.    09/12/2016 at Unknown time  . pantoprazole (PROTONIX) 40 MG tablet Take 1 tablet (40 mg total) by mouth daily. (Patient taking differently: Take 40 mg by mouth daily as needed (heartburn). )   Past Week at Unknown time  . potassium chloride SA (K-DUR,KLOR-CON) 20 MEQ tablet Take 1 tablet (20 mEq total) by mouth daily. 30 tablet 11 09/12/2016 at Unknown time  . QUEtiapine (SEROQUEL) 400 MG tablet Take 200 mg by mouth at bedtime.    09/11/2016 at Unknown time   Scheduled:  . amLODipine  2.5 mg Oral Daily  . [START ON 09/14/2016] aspirin EC  81 mg Oral Daily  . carvedilol  12.5 mg Oral BID WC  . clopidogrel  75 mg Oral Q24H  . ezetimibe  10 mg Oral Daily  . insulin aspart  0-20 Units Subcutaneous TID WC  . insulin glargine  64 Units Subcutaneous QHS  . isosorbide mononitrate  60 mg Oral Daily  . pantoprazole  40 mg Oral Daily  . QUEtiapine  200 mg Oral QHS  . sodium chloride flush  3 mL Intravenous Q12H     Assessment: 70yo male on IV heparin with initial dosing while Eliquis on hold. PTT = 62 seconds, still subtherapeutic on IV heparin 1700 units/hr.  Assuming  HL elevated in setting of apixaban use and will adjust IV heparin based on aPTT until they correlate. Peak troponin 2.26. Cardiologist plans for cardiac cath this afternoon.  No bleeding noted. Admit CBC wnl   Goal of Therapy:  aPTT 66-102 seconds  Monitor platelets by anticoagulation protocol: Yes     Plan:  Will increase heparin drip to 1850 units/hr Follow up after cath if heparin restarted. Daily HL,CBC  Noah Delaine, [place] Clinical Pharmacist Pager: [phone] 8A-4P [phone] 4P-10P [phone] [facility] [phone] 09/13/2016,12:16 PM

## 2016-09-13 NOTE — Progress Notes (Signed)
ANTICOAGULATION CONSULT NOTE - Follow Up Consult  Pharmacy Consult for Heparin Indication: h/o PE  Allergies  Allergen Reactions  . Metformin And Related Nausea And Vomiting  . Pravachol Nausea And Vomiting  . Ramipril Nausea Only  . Wellbutrin [Bupropion Hcl] Nausea And Vomiting    Patient Measurements: Height: 6\' 3"  (190.5 cm) Weight: (!) 302 lb 9.6 oz (137.3 kg) IBW/kg (Calculated) : 84.5 Heparin Dosing Weight:  115.1 kg  Vital Signs: BP: 122/64 (03/23 1850) Pulse Rate: 90 (03/23 1850)  Labs:  Recent Labs  09/12/16 1628  09/12/16 2317 09/13/16 0345 09/13/16 1031  HGB 14.8  --   --   --   --   HCT 45.2  --   --   --   --   PLT 104*  --   --   --   --   APTT  --   --   --  50* 62*  LABPROT 13.3  --   --   --   --   INR 1.01  --   --   --   --   HEPARINUNFRC  --   --   --  1.12*  --   CREATININE 1.56*  --   --  1.48*  --   TROPONINI  --   < > 2.09* 1.81* 2.26*  < > = values in this interval not displayed.  Estimated Creatinine Clearance: 67.4 mL/min (A) (by C-G formula based on SCr of 1.48 mg/dL (H)).   Assessment: 70yo male on IV heparin while Eliquis on hold for h/o PE. - Cath: - Cath: The patient has severe diffuse native coronary disease with very poor PCI targets. He has now 2 out of the 3 new vein grafts that are occluded including the new vein graft to RCA and new vein graft to distal LAD. He has progression of disease in the native LAD-diagonal system as well as the AV groove circumflex system. The AV groove system arise collaterals to the distal RCA.  - Radial Sheath out 1825  Goal of Therapy:  aPTT 66-102 seconds Monitor platelets by anticoagulation protocol: Yes   Plan:  At 0225 on 3/24, resume IV heparin drip at 1850 units/hr APTT, CBC, and HL in AM   Landis Dowdy S. Merilynn Finlandobertson, PharmD, BCPS Clinical Staff Pharmacist Pager [phone]  Misty Stanleyobertson, Coltin Casher Stillinger 09/13/2016,7:05 PM

## 2016-09-13 NOTE — Progress Notes (Signed)
Progress Note  Patient Name: Tyler Le Date of Encounter: 09/13/2016  Primary Cardiologist: Dr. Elease HashimotoNahser  Subjective   Feeling well, no reoccurring CP. No SOB, palpitations, le swelling, orthopnea.  Inpatient Medications    Scheduled Meds: . amLODipine  2.5 mg Oral Daily  . [START ON 09/14/2016] aspirin EC  81 mg Oral Daily  . carvedilol  12.5 mg Oral BID WC  . clopidogrel  75 mg Oral Q24H  . ezetimibe  10 mg Oral Daily  . insulin aspart  0-20 Units Subcutaneous TID WC  . insulin glargine  64 Units Subcutaneous QHS  . isosorbide mononitrate  60 mg Oral Daily  . pantoprazole  40 mg Oral Daily  . QUEtiapine  200 mg Oral QHS  . sodium chloride flush  3 mL Intravenous Q12H   Continuous Infusions: . sodium chloride 10 mL/hr at 09/13/16 0600  . heparin 1,700 Units/hr (09/13/16 0529)   PRN Meds: sodium chloride, acetaminophen, nitroGLYCERIN, ondansetron (ZOFRAN) IV, sodium chloride flush   Vital Signs    Vitals:   09/12/16 2045 09/12/16 2100 09/12/16 2221 09/13/16 0614  BP: 115/63 132/89 (!) 168/66 123/77  Pulse: 96 98 98 (!) 105  Resp: (!) 23 (!) 22  20  Temp:   99 F (37.2 C) 98.6 F (37 C)  TempSrc:   Oral Oral  SpO2: 96% 95% 93% 98%  Weight:   (!) 302 lb 9.6 oz (137.3 kg)   Height:   6\' 3"  (1.905 m)     Intake/Output Summary (Last 24 hours) at 09/13/16 0934 Last data filed at 09/13/16 0600  Gross per 24 hour  Intake           109.46 ml  Output                0 ml  Net           109.46 ml   Filed Weights   09/12/16 1819 09/12/16 2221  Weight: 290 lb (131.5 kg) (!) 302 lb 9.6 oz (137.3 kg)    Telemetry    HR 95-105, NSR - Personally Reviewed  ECG    None since March 22 - Personally Reviewed  Physical Exam   GEN: No acute distress.   Neck: No JVD Cardiac: RRR, no murmurs, rubs, or gallops.  Respiratory: Clear to auscultation bilaterally.  GI: Soft, nontender, non-distended  MS: No edema; No deformity. Neuro:  Nonfocal  Psych: Normal affect     Labs    Chemistry Recent Labs Lab 09/12/16 1628 09/13/16 0345  NA 140 138  K 4.8 3.5  CL 100* 98*  CO2 29 30  GLUCOSE 212* 135*  BUN 16 13  CREATININE 1.56* 1.48*  CALCIUM 8.7* 8.3*  GFRNONAA 43* 46*  GFRAA 49* 53*  ANIONGAP 11 10     Hematology Recent Labs Lab 09/12/16 1628  WBC 6.3  RBC 4.73  HGB 14.8  HCT 45.2  MCV 95.6  MCH 31.3  MCHC 32.7  RDW 14.0  PLT 104*    Cardiac Enzymes Recent Labs Lab 09/12/16 1735 09/12/16 2317 09/13/16 0345  TROPONINI 1.54* 2.09* 1.81*    Recent Labs Lab 09/12/16 1640  TROPIPOC 0.65*     BNP Recent Labs Lab 09/12/16 1735  BNP 124.3*     Radiology    Dg Chest 2 View Result Date: 09/12/2016 1.  Prior CABG.  Cardiomegaly. 2. No acute pulmonary disease   Cardiac Studies   Transthoracic echocardiography 12/04/2015 Study Conclusions  - Left ventricle:  The cavity size was normal. Wall thickness was   increased in a pattern of moderate LVH. Systolic function was   normal. The estimated ejection fraction was in the range of 55%   to 60%. Wall motion was normal; there were no regional wall   motion abnormalities. Features are consistent with a pseudonormal   left ventricular filling pattern, with concomitant abnormal   relaxation and increased filling pressure (grade 2 diastolic   dysfunction). - Aortic valve: Trileaflet; moderately thickened, moderately   calcified leaflets. - Aorta: Aortic root dimension: 38 mm (ED). - Ascending aorta: The ascending aorta was mildly dilated. - Right ventricle: The cavity size was mildly dilated. Wall   thickness was normal. Systolic function was mildly reduced. - Right atrium: The atrium was mildly dilated. - Tricuspid valve: There was moderate regurgitation. - Pulmonary arteries: Systolic pressure was moderately increased.   PA peak pressure: 52 mm Hg (S).  Impressions:  - Compared to the prior study, there has been no significant   interval change.  Patient  Profile     71 yo male with PMH of CAD s/p redo CABG 4/17, ICM, HTN, HL, IDDM, and saddle PE who presented to the ED with reports of chest pain. He had ongoing pain that improved with IV nitro and an elevated Troponin 1.54 << 2.09 << 1.81. IV Heparin started, pt had taken a dose of Eliquis and therefore on schedule for cath 3/23 after lunch time.  Assessment & Plan    1. NSTEMI: Worsening dyspnea on exertion. EKG showed SR with RBBB, and new ST depression in v2, v3. Trop with ACS trend.  -- troponin peaked at 2.09; 1.54 << 2.09 << 1.81. -- due for cardiac cath today.  -- Currently he is CP free. -- Hold Eliquis , continue heparin -- Will need repeat echo during this admission if EF decreased during cath --He would not be a candidate for a 3rd CABG. Last surgery was followed by multiple serious complications and very long recovery.  2. HTN: Continues to be borderline controlled in hospital, will monitor and continue home meds.  3. HL:  --LDL 43 md/gL --Need to clarify which statins he has tried and was intolerant of. Recommend PCSK9 inhab per Dr. Royann Shivers  4. PE: On Eliquis, had dose yesterday which will delay cath till the evening.  5. Hx of ICM: Normal EF on last echo, will see how cath goes to determine if repeat echo needed.  6. IDDM: SSI    Cardiac cath pending for today, will be this afternoon since has had a dose of his Eliquis yesterday. Remains CP free. Continue current medication regimen.   Suan Halter, PA-C  09/13/2016, 9:34 AM    See my other note today.   Verne Carrow 09/13/2016 11:36 AM

## 2016-09-14 LAB — CBC
HCT: 41.3 % (ref 39.0–52.0)
HEMOGLOBIN: 13.5 g/dL (ref 13.0–17.0)
MCH: 31.1 pg (ref 26.0–34.0)
MCHC: 32.7 g/dL (ref 30.0–36.0)
MCV: 95.2 fL (ref 78.0–100.0)
Platelets: 97 10*3/uL — ABNORMAL LOW (ref 150–400)
RBC: 4.34 MIL/uL (ref 4.22–5.81)
RDW: 14.3 % (ref 11.5–15.5)
WBC: 4.9 10*3/uL (ref 4.0–10.5)

## 2016-09-14 LAB — GLUCOSE, CAPILLARY
GLUCOSE-CAPILLARY: 279 mg/dL — AB (ref 65–99)
Glucose-Capillary: 267 mg/dL — ABNORMAL HIGH (ref 65–99)
Glucose-Capillary: 251 mg/dL — ABNORMAL HIGH (ref 65–99)
Glucose-Capillary: 210 mg/dL — ABNORMAL HIGH (ref 65–99)

## 2016-09-14 LAB — TROPONIN I
TROPONIN I: 2.23 ng/mL — AB (ref <0.03)
TROPONIN I: 1.68 ng/mL — AB (ref <0.03)
TROPONIN I: 1.5 ng/mL — AB (ref <0.03)
TROPONIN I: 1.78 ng/mL — AB (ref <0.03)
Troponin I: 2.13 ng/mL (ref <0.03)

## 2016-09-14 LAB — HEPARIN LEVEL (UNFRACTIONATED): Heparin Unfractionated: 0.53 IU/mL (ref 0.30–0.70)

## 2016-09-14 LAB — APTT: aPTT: 68 seconds — ABNORMAL HIGH (ref 24–36)

## 2016-09-14 MED ORDER — SODIUM CHLORIDE 0.9 % IV BOLUS (SEPSIS)
500.0000 mL | Freq: Once | INTRAVENOUS | Status: AC
  Administered 2016-09-14: 500 mL via INTRAVENOUS

## 2016-09-14 MED ORDER — RANOLAZINE ER 500 MG PO TB12
500.0000 mg | ORAL_TABLET | Freq: Two times a day (BID) | ORAL | Status: DC
  Administered 2016-09-14 – 2016-09-15 (×4): 500 mg via ORAL
  Filled 2016-09-14 (×3): qty 1

## 2016-09-14 MED ORDER — ISOSORBIDE MONONITRATE ER 30 MG PO TB24
30.0000 mg | ORAL_TABLET | Freq: Every day | ORAL | Status: DC
  Administered 2016-09-15: 30 mg via ORAL
  Filled 2016-09-14: qty 1

## 2016-09-14 NOTE — Progress Notes (Signed)
Pt c/o of CP 7/10, 2 Nitro given BP low 82/46 and 96/68 manually. Pt rated pain 0/10 after  2 nitro. EKG done revealed ACUTE MI/STEMI . MD on call notified. MD came to bedside to evaluate pt. Repeat EKG showed NSR. 500 cc bolus given, Ranexa started.  Pt resting in bed. Will continue to monitor.   Tyler Le M

## 2016-09-14 NOTE — Progress Notes (Signed)
ANTICOAGULATION CONSULT NOTE - Follow Up Consult  Pharmacy Consult for Heparin Indication: NSTEMI and hx PE  Allergies  Allergen Reactions  . Metformin And Related Nausea And Vomiting  . Pravachol Nausea And Vomiting  . Ramipril Nausea Only  . Wellbutrin [Bupropion Hcl] Nausea And Vomiting    Patient Measurements: Height: 6\' 3"  (190.5 cm) Weight: (!) 302 lb 9.6 oz (137.3 kg) IBW/kg (Calculated) : 84.5 Heparin Dosing Weight: 115kg  Vital Signs: Temp: 98.6 F (37 C) (03/24 0905) Temp Source: Oral (03/24 0905) BP: 96/45 (03/24 0947) Pulse Rate: 84 (03/24 0905)  Labs:  Recent Labs  09/12/16 1628  09/13/16 0345 09/13/16 1031 09/13/16 2322 09/14/16 0351 09/14/16 0931  HGB 14.8  --   --   --   --  13.5  --   HCT 45.2  --   --   --   --  41.3  --   PLT 104*  --   --   --   --  97*  --   APTT  --   --  50* 62*  --   --  68*  LABPROT 13.3  --   --   --   --   --   --   INR 1.01  --   --   --   --   --   --   HEPARINUNFRC  --   --  1.12*  --   --   --  0.53  CREATININE 1.56*  --  1.48*  --   --   --   --   TROPONINI  --   < > 1.81* 2.26* 2.13* 2.23* 1.68*  < > = values in this interval not displayed.  Estimated Creatinine Clearance: 67.4 mL/min (A) (by C-G formula based on SCr of 1.48 mg/dL (H)).   Medications:  Heparin @ 1850 units/hr  Assessment: 70yom on apixaban pta for hx PE, transitioned to IV heparin for NSTEMI pending cath. S/P cath yesterday found to have diffuse CAD with no good targets for PCI. Heparin resumed post cath for elevated troponins and continued chest pain. Heparin level is therapeutic at 0.53. CBC stable except plts low at 97. No bleeding reported.  Goal of Therapy:  Heparin level 0.3-0.7 units/ml Monitor platelets by anticoagulation protocol: Yes   Plan:  1) Continue heparin at 1850 units/hr 2) Daily heparin level and CBC  Fredrik RiggerMarkle, Decie Verne Sue 09/14/2016,11:13 AM

## 2016-09-14 NOTE — Progress Notes (Signed)
Repeat EKG without ischemic changes, troponin continue to trend down. Continue medical therapy at this time   Dominga FerryJ Okechukwu Regnier MD

## 2016-09-14 NOTE — Progress Notes (Signed)
Primary cardiologist:  Subjective:    Episode of chest pain last night after walking back from bathroom. Midchest pressure similar to symptoms he has had at home. Resolved after 20-2530minutes. Repeat episode this AM after walking back from bathroom. Currently pain free.   Objective:   Temp:  [97.6 F (36.4 C)-98.6 F (37 C)] 98.6 F (37 C) (03/24 0905) Pulse Rate:  [0-94] 84 (03/24 0905) Resp:  [0-19] 18 (03/24 0905) BP: (82-138)/(39-89) 96/45 (03/24 0947) SpO2:  [0 %-98 %] 94 % (03/24 0905) Last BM Date: 09/13/16  Filed Weights   09/12/16 1819 09/12/16 2221  Weight: 290 lb (131.5 kg) (!) 302 lb 9.6 oz (137.3 kg)    Intake/Output Summary (Last 24 hours) at 09/14/16 0955 Last data filed at 09/14/16 0740  Gross per 24 hour  Intake                0 ml  Output             1025 ml  Net            -1025 ml    Telemetry: NSR  Exam:  General: NAD  HEENT: sclera clear  Resp: CTAB  Cardiac: RRR, no m/r/g, no jvd  GI: abdomen soft, NT, ND  MSK: no LE edema  Neuro: no focal deficits  Psych: appropriate affect Lab Results:  Basic Metabolic Panel:  Recent Labs Lab 09/12/16 1628 09/13/16 0345  NA 140 138  K 4.8 3.5  CL 100* 98*  CO2 29 30  GLUCOSE 212* 135*  BUN 16 13  CREATININE 1.56* 1.48*  CALCIUM 8.7* 8.3*    Liver Function Tests: No results for input(s): AST, ALT, ALKPHOS, BILITOT, PROT, ALBUMIN in the last 168 hours.  CBC:  Recent Labs Lab 09/12/16 1628 09/14/16 0351  WBC 6.3 4.9  HGB 14.8 13.5  HCT 45.2 41.3  MCV 95.6 95.2  PLT 104* 97*    Cardiac Enzymes:  Recent Labs Lab 09/13/16 1031 09/13/16 2322 09/14/16 0351  TROPONINI 2.26* 2.13* 2.23*    BNP: No results for input(s): PROBNP in the last 8760 hours.  Coagulation:  Recent Labs Lab 09/12/16 1628  INR 1.01    ECG:   Medications:   Scheduled Medications: . amLODipine  2.5 mg Oral Daily  . aspirin EC  81 mg Oral Daily  . carvedilol  12.5 mg Oral BID WC  .  clopidogrel  75 mg Oral Q24H  . ezetimibe  10 mg Oral Daily  . insulin aspart  0-20 Units Subcutaneous TID WC  . insulin glargine  64 Units Subcutaneous QHS  . isosorbide mononitrate  60 mg Oral Daily  . pantoprazole  40 mg Oral Daily  . QUEtiapine  200 mg Oral QHS  . sodium chloride flush  3 mL Intravenous Q12H     Infusions: . heparin 1,850 Units/hr (09/14/16 0301)     PRN Medications:  sodium chloride, acetaminophen, morphine injection, nitroGLYCERIN, ondansetron (ZOFRAN) IV, sodium chloride flush     Assessment/Plan     1. CAD - history of CABG and redo CABG admitted with unstable angina - cath yesterday: mid LAD 100%. Distal LAD filles by LIMA, distal LAD 80%. D1 95%, D2 100%, ramus 100% and fills vis SVG, prox LCX 95%, OM1 100%. RCA occluded with psterior branches filling via colalterals. LVEDP 27 - SVG-LAD occluded, SVG-Diag occluded, SVG-OM1 patent, SVG-RCA occluded - complex anatomy, overall thought to be poor PCI targets. Goals for medical therapy.  - 11/2015  echo LVEF 55-60%, grade II diastolic dysfunction Trop 2.2.6-->2.13-->2.23  - currently chest pain free  - medical therapy with norvasc 2.5, coreg 12.5 bid, plavix 75, zetia, hep gtt, imdur 60, asa 81 - chest pain last night and this AM. We will start ranexa 500mg  bid. Follow EKG and enzymes.  - soft bp's, we will lower imdur to 30mg  daily. NS bolus.  - if continued symptoms througout weekend will have to discuss again with interventional cath if any possible targets.    Dina Rich, M.D.

## 2016-09-15 LAB — BASIC METABOLIC PANEL
Anion gap: 10 (ref 5–15)
BUN: 13 mg/dL (ref 6–20)
CALCIUM: 8.5 mg/dL — AB (ref 8.9–10.3)
CHLORIDE: 104 mmol/L (ref 101–111)
CO2: 25 mmol/L (ref 22–32)
CREATININE: 1.39 mg/dL — AB (ref 0.61–1.24)
GFR calc Af Amer: 57 mL/min — ABNORMAL LOW (ref >60)
GFR, EST NON AFRICAN AMERICAN: 49 mL/min — AB (ref >60)
Glucose, Bld: 206 mg/dL — ABNORMAL HIGH (ref 65–99)
POTASSIUM: 3.9 mmol/L (ref 3.5–5.1)
SODIUM: 139 mmol/L (ref 135–145)

## 2016-09-15 LAB — CBC
HCT: 40.3 % (ref 39.0–52.0)
Hemoglobin: 13.1 g/dL (ref 13.0–17.0)
MCH: 30.8 pg (ref 26.0–34.0)
MCHC: 32.5 g/dL (ref 30.0–36.0)
MCV: 94.6 fL (ref 78.0–100.0)
Platelets: 99 10*3/uL — ABNORMAL LOW (ref 150–400)
RBC: 4.26 MIL/uL (ref 4.22–5.81)
RDW: 14.3 % (ref 11.5–15.5)
WBC: 5.1 10*3/uL (ref 4.0–10.5)

## 2016-09-15 LAB — HEPARIN LEVEL (UNFRACTIONATED): Heparin Unfractionated: 0.51 IU/mL (ref 0.30–0.70)

## 2016-09-15 LAB — GLUCOSE, CAPILLARY
GLUCOSE-CAPILLARY: 293 mg/dL — AB (ref 65–99)
GLUCOSE-CAPILLARY: 261 mg/dL — AB (ref 65–99)
Glucose-Capillary: 179 mg/dL — ABNORMAL HIGH (ref 65–99)
Glucose-Capillary: 166 mg/dL — ABNORMAL HIGH (ref 65–99)
Glucose-Capillary: 281 mg/dL — ABNORMAL HIGH (ref 65–99)

## 2016-09-15 LAB — TROPONIN I: TROPONIN I: 1.23 ng/mL — AB (ref <0.03)

## 2016-09-15 MED ORDER — RANOLAZINE ER 500 MG PO TB12
500.0000 mg | ORAL_TABLET | Freq: Once | ORAL | Status: AC
  Administered 2016-09-15: 500 mg via ORAL
  Filled 2016-09-15: qty 1

## 2016-09-15 MED ORDER — HEPARIN SODIUM (PORCINE) 5000 UNIT/ML IJ SOLN
5000.0000 [IU] | Freq: Three times a day (TID) | INTRAMUSCULAR | Status: DC
  Administered 2016-09-15 – 2016-09-16 (×3): 5000 [IU] via SUBCUTANEOUS
  Filled 2016-09-15 (×3): qty 1

## 2016-09-15 MED ORDER — RANOLAZINE ER 500 MG PO TB12
1000.0000 mg | ORAL_TABLET | Freq: Two times a day (BID) | ORAL | Status: DC
  Administered 2016-09-15 – 2016-09-18 (×6): 1000 mg via ORAL
  Filled 2016-09-15 (×6): qty 2

## 2016-09-15 NOTE — Progress Notes (Signed)
Subjective:    Mild chest discomfort overnight  Objective:   Temp:  [98.2 F (36.8 C)-98.5 F (36.9 C)] 98.5 F (36.9 C) (03/25 0620) Pulse Rate:  [86-90] 88 (03/25 1024) Resp:  [18] 18 (03/25 1024) BP: (129-157)/(63-68) 134/67 (03/25 1024) SpO2:  [94 %-100 %] 95 % (03/25 1024) Last BM Date: 09/13/16  Filed Weights   09/12/16 1819 09/12/16 2221  Weight: 290 lb (131.5 kg) (!) 302 lb 9.6 oz (137.3 kg)    Intake/Output Summary (Last 24 hours) at 09/15/16 1048 Last data filed at 09/15/16 1610  Gross per 24 hour  Intake                0 ml  Output              900 ml  Net             -900 ml    Telemetry: NSR  Exam:  General: NAD  HEENT: sclera clear, throat clear  Resp: CTAB  Cardiac: RRR, no mr/g no jvd  GI: abdomen soft, NT, ND  MSK: trace bilateral LE edema  Neuro: no focal deficits  Psych: appropraite affect  Lab Results:  Basic Metabolic Panel:  Recent Labs Lab 09/12/16 1628 09/13/16 0345 09/15/16 0350  NA 140 138 139  K 4.8 3.5 3.9  CL 100* 98* 104  CO2 29 30 25   GLUCOSE 212* 135* 206*  BUN 16 13 13   CREATININE 1.56* 1.48* 1.39*  CALCIUM 8.7* 8.3* 8.5*    Liver Function Tests: No results for input(s): AST, ALT, ALKPHOS, BILITOT, PROT, ALBUMIN in the last 168 hours.  CBC:  Recent Labs Lab 09/12/16 1628 09/14/16 0351 09/15/16 0350  WBC 6.3 4.9 5.1  HGB 14.8 13.5 13.1  HCT 45.2 41.3 40.3  MCV 95.6 95.2 94.6  PLT 104* 97* 99*    Cardiac Enzymes:  Recent Labs Lab 09/14/16 1558 09/14/16 2140 09/15/16 0350  TROPONINI 1.50* 1.78* 1.23*    BNP: No results for input(s): PROBNP in the last 8760 hours.  Coagulation:  Recent Labs Lab 09/12/16 1628  INR 1.01    ECG:   Medications:   Scheduled Medications: . amLODipine  2.5 mg Oral Daily  . aspirin EC  81 mg Oral Daily  . carvedilol  12.5 mg Oral BID WC  . clopidogrel  75 mg Oral Q24H  . ezetimibe  10 mg Oral Daily  . insulin aspart  0-20 Units Subcutaneous  TID WC  . insulin glargine  64 Units Subcutaneous QHS  . isosorbide mononitrate  30 mg Oral Daily  . pantoprazole  40 mg Oral Daily  . QUEtiapine  200 mg Oral QHS  . ranolazine  500 mg Oral BID  . sodium chloride flush  3 mL Intravenous Q12H     Infusions: . heparin 1,850 Units/hr (09/15/16 0726)     PRN Medications:  sodium chloride, acetaminophen, morphine injection, nitroGLYCERIN, ondansetron (ZOFRAN) IV, sodium chloride flush     Assessment/Plan    1. CAD - history of CABG and redo CABG admitted with unstable angina - cath yesterday: mid LAD 100%. Distal LAD filles by LIMA, distal LAD 80%. D1 95%, D2 100%, ramus 100% and fills vis SVG, prox LCX 95%, OM1 100%. RCA occluded with psterior branches filling via colalterals. LVEDP 27 - SVG-LAD occluded, SVG-Diag occluded, SVG-OM1 patent, SVG-RCA occluded - complex anatomy, overall thought to be poor PCI targets. Goals for medical therapy.  - 11/2015 echo LVEF 55-60%, grade II  diastolic dysfunction    - medical therapy with norvasc 2.5, coreg 12.5 bid, plavix 75, zetia, hep gtt, imdur 60, asa 81 - chest pain episodes yesterday, both after walking back from bathroom. NO acute EKG changes, enzymes continue to trend down.  - started on ranexa 500mg  bid. We continued his heparin. Low bp's that resolved with IVF.   - very mild chest pain last night and this morning. We will increase ranexa to 1000mg  bid. Stop heparin gtt today. Have patient begin ambulating with nursing staff and follow symptoms. If refractory symptoms may have to readdress anatomy with interventional on Monday           Dina RichJonathan Jameika Kinn, M.D.

## 2016-09-15 NOTE — Progress Notes (Signed)
Ambulate the patient in the hallway for 200 -300 feet. Patient O2 96% with O2 at 2L and 94% O2 without oxygen but when patient went back to the room without oxygen and O2 at 94% patient stated he is shortness of breath and complaining of  chest tightness. Patient wife at bedside. Morphine 2 mg given and effectiveness noted. Put O2 at 2L patient stated he feels better.

## 2016-09-15 NOTE — Progress Notes (Signed)
ANTICOAGULATION CONSULT NOTE - Follow Up Consult  Pharmacy Consult for Heparin Indication: NSTEMI and hx PE  Allergies  Allergen Reactions  . Metformin And Related Nausea And Vomiting  . Pravachol Nausea And Vomiting  . Ramipril Nausea Only  . Wellbutrin [Bupropion Hcl] Nausea And Vomiting    Patient Measurements: Height: 6\' 3"  (190.5 cm) Weight: (!) 302 lb 9.6 oz (137.3 kg) IBW/kg (Calculated) : 84.5 Heparin Dosing Weight: 115kg  Vital Signs: Temp: 98.5 F (36.9 C) (03/25 0620) Temp Source: Oral (03/25 0620) BP: 134/67 (03/25 1024) Pulse Rate: 88 (03/25 1024)  Labs:  Recent Labs  09/12/16 1628  09/13/16 0345 09/13/16 1031  09/14/16 0351 09/14/16 0931 09/14/16 1558 09/14/16 2140 09/15/16 0350  HGB 14.8  --   --   --   --  13.5  --   --   --  13.1  HCT 45.2  --   --   --   --  41.3  --   --   --  40.3  PLT 104*  --   --   --   --  97*  --   --   --  99*  APTT  --   --  50* 62*  --   --  68*  --   --   --   LABPROT 13.3  --   --   --   --   --   --   --   --   --   INR 1.01  --   --   --   --   --   --   --   --   --   HEPARINUNFRC  --   --  1.12*  --   --   --  0.53  --   --  0.51  CREATININE 1.56*  --  1.48*  --   --   --   --   --   --  1.39*  TROPONINI  --   < > 1.81* 2.26*  < > 2.23* 1.68* 1.50* 1.78* 1.23*  < > = values in this interval not displayed.  Estimated Creatinine Clearance: 71.8 mL/min (A) (by C-G formula based on SCr of 1.39 mg/dL (H)).   Medications:  Heparin @ 1850 units/hr  Assessment: 70yom on apixaban pta for hx PE, transitioned to IV heparin for NSTEMI pending cath. S/P cath 3/23 found to have diffuse CAD with no good targets for PCI. Heparin resumed post cath for elevated troponins and continued chest pain. Troponins now trending down. Heparin level is therapeutic at 0.51. CBC stable except plts low at 99. No bleeding reported.  Goal of Therapy:  Heparin level 0.3-0.7 units/ml Monitor platelets by anticoagulation protocol: Yes    Plan:  1) Continue heparin at 1850 units/hr 2) Daily heparin level and CBC  Fredrik RiggerMarkle, Bronc Brosseau Sue 09/15/2016,10:49 AM

## 2016-09-15 NOTE — Progress Notes (Signed)
Patient complaining of chest pain and tightness in the chest after walking to the bathroom.Patient states pain 5/10 while ambulating and  3/10 while resting. Vital signs taken  and recorded. Nitroglycerin 1 tab given. EKG in progress. Patient refused Morphine IV. MD notified. Patient spouse at bedside.

## 2016-09-16 ENCOUNTER — Encounter (HOSPITAL_COMMUNITY): Payer: Self-pay | Admitting: Cardiology

## 2016-09-16 LAB — GLUCOSE, CAPILLARY
GLUCOSE-CAPILLARY: 174 mg/dL — AB (ref 65–99)
GLUCOSE-CAPILLARY: 173 mg/dL — AB (ref 65–99)
GLUCOSE-CAPILLARY: 255 mg/dL — AB (ref 65–99)
Glucose-Capillary: 244 mg/dL — ABNORMAL HIGH (ref 65–99)
Glucose-Capillary: 232 mg/dL — ABNORMAL HIGH (ref 65–99)

## 2016-09-16 LAB — BASIC METABOLIC PANEL
Anion gap: 9 (ref 5–15)
BUN: 12 mg/dL (ref 6–20)
CO2: 27 mmol/L (ref 22–32)
CREATININE: 1.41 mg/dL — AB (ref 0.61–1.24)
Calcium: 8.6 mg/dL — ABNORMAL LOW (ref 8.9–10.3)
Chloride: 102 mmol/L (ref 101–111)
GFR calc Af Amer: 56 mL/min — ABNORMAL LOW (ref >60)
GFR, EST NON AFRICAN AMERICAN: 48 mL/min — AB (ref >60)
Glucose, Bld: 178 mg/dL — ABNORMAL HIGH (ref 65–99)
POTASSIUM: 3.9 mmol/L (ref 3.5–5.1)
Sodium: 138 mmol/L (ref 135–145)

## 2016-09-16 LAB — CBC
HCT: 40.3 % (ref 39.0–52.0)
Hemoglobin: 13 g/dL (ref 13.0–17.0)
MCH: 30.7 pg (ref 26.0–34.0)
MCHC: 32.3 g/dL (ref 30.0–36.0)
MCV: 95.3 fL (ref 78.0–100.0)
PLATELETS: 108 10*3/uL — AB (ref 150–400)
RBC: 4.23 MIL/uL (ref 4.22–5.81)
RDW: 14.6 % (ref 11.5–15.5)
WBC: 6 10*3/uL (ref 4.0–10.5)

## 2016-09-16 MED ORDER — APIXABAN 5 MG PO TABS
5.0000 mg | ORAL_TABLET | Freq: Two times a day (BID) | ORAL | Status: DC
  Administered 2016-09-16 – 2016-09-18 (×5): 5 mg via ORAL
  Filled 2016-09-16 (×5): qty 1

## 2016-09-16 MED ORDER — ISOSORBIDE MONONITRATE ER 60 MG PO TB24
60.0000 mg | ORAL_TABLET | Freq: Every day | ORAL | Status: DC
  Administered 2016-09-16 – 2016-09-18 (×3): 60 mg via ORAL
  Filled 2016-09-16 (×3): qty 1

## 2016-09-16 MED ORDER — FUROSEMIDE 40 MG PO TABS
40.0000 mg | ORAL_TABLET | Freq: Every day | ORAL | Status: DC
  Administered 2016-09-16 – 2016-09-18 (×3): 40 mg via ORAL
  Filled 2016-09-16 (×3): qty 1

## 2016-09-16 MED FILL — Heparin Sodium (Porcine) 2 Unit/ML in Sodium Chloride 0.9%: INTRAMUSCULAR | Qty: 500 | Status: AC

## 2016-09-16 NOTE — Progress Notes (Signed)
Progress Note  Patient Name: Tyler Le Date of Encounter: 09/16/2016  Primary Cardiologist: Nahser  Subjective   Had angina with walking yesterday, improved with rest, relieved by NTG. Still on O2. BP borderline low. Ranexa dose increased starting with 2200h dose last night. Troponin trending down, ECG changes are better than on admission. Weight has increased since admission, despite diuretics (IVF pre-cath). Renal function moderately abnormal, close to baseline.  Inpatient Medications    Scheduled Meds: . amLODipine  2.5 mg Oral Daily  . aspirin EC  81 mg Oral Daily  . carvedilol  12.5 mg Oral BID WC  . clopidogrel  75 mg Oral Q24H  . ezetimibe  10 mg Oral Daily  . heparin  5,000 Units Subcutaneous Q8H  . insulin aspart  0-20 Units Subcutaneous TID WC  . insulin glargine  64 Units Subcutaneous QHS  . isosorbide mononitrate  60 mg Oral Daily  . pantoprazole  40 mg Oral Daily  . QUEtiapine  200 mg Oral QHS  . ranolazine  1,000 mg Oral BID  . sodium chloride flush  3 mL Intravenous Q12H   Continuous Infusions:  PRN Meds: sodium chloride, acetaminophen, morphine injection, nitroGLYCERIN, ondansetron (ZOFRAN) IV, sodium chloride flush   Vital Signs    Vitals:   09/15/16 1735 09/15/16 1820 09/15/16 2054 09/16/16 0419  BP: (!) 118/53 (!) 100/50 (!) 116/46 (!) 153/79  Pulse: 88 88 87 89  Resp: 18 18 18 18   Temp:  98.2 F (36.8 C) 98.5 F (36.9 C) 98.1 F (36.7 C)  TempSrc:  Oral Oral Oral  SpO2: 95% 95% 97% 96%  Weight:    (!) 139.2 kg (306 lb 14.4 oz)  Height:        Intake/Output Summary (Last 24 hours) at 09/16/16 1610 Last data filed at 09/16/16 0422  Gross per 24 hour  Intake                0 ml  Output              350 ml  Net             -350 ml   Filed Weights   09/12/16 1819 09/12/16 2221 09/16/16 0419  Weight: 131.5 kg (290 lb) (!) 137.3 kg (302 lb 9.6 oz) (!) 139.2 kg (306 lb 14.4 oz)    Telemetry    NSR - Personally Reviewed  ECG      NSR, RBBB, less prominent inferolateral ST depression - Personally Reviewed  Physical Exam  Comfortable at rest, Obese. GEN: No acute distress.   Neck: No JVD Cardiac: RRR, no murmurs, rubs, or gallops.  Respiratory: Clear to auscultation bilaterally. GI: Soft, nontender, non-distended  MS: No edema; No deformity. Neuro:  Nonfocal  Psych: Normal affect   Labs    Chemistry Recent Labs Lab 09/13/16 0345 09/15/16 0350 09/16/16 0247  NA 138 139 138  K 3.5 3.9 3.9  CL 98* 104 102  CO2 30 25 27   GLUCOSE 135* 206* 178*  BUN 13 13 12   CREATININE 1.48* 1.39* 1.41*  CALCIUM 8.3* 8.5* 8.6*  GFRNONAA 46* 49* 48*  GFRAA 53* 57* 56*  ANIONGAP 10 10 9      Hematology Recent Labs Lab 09/14/16 0351 09/15/16 0350 09/16/16 0247  WBC 4.9 5.1 6.0  RBC 4.34 4.26 4.23  HGB 13.5 13.1 13.0  HCT 41.3 40.3 40.3  MCV 95.2 94.6 95.3  MCH 31.1 30.8 30.7  MCHC 32.7 32.5 32.3  RDW 14.3  14.3 14.6  PLT 97* 99* 108*    Cardiac Enzymes Recent Labs Lab 09/14/16 0931 09/14/16 1558 09/14/16 2140 09/15/16 0350  TROPONINI 1.68* 1.50* 1.78* 1.23*    Recent Labs Lab 09/12/16 1640  TROPIPOC 0.65*     BNP Recent Labs Lab 09/12/16 1735  BNP 124.3*     DDimer No results for input(s): DDIMER in the last 168 hours.   Radiology    No results found.  Cardiac Studies   Cath 09/13/16  Diagnostic Diagram      Echo: 6/17  Study Conclusions  - Left ventricle: The cavity size was normal. Wall thickness was increased in a pattern of moderate LVH. Systolic function was normal. The estimated ejection fraction was in the range of 55% to 60%. Wall motion was normal; there were no regional wall motion abnormalities. Features are consistent with a pseudonormal left ventricular filling pattern, with concomitant abnormal relaxation and increased filling pressure (grade 2 diastolic dysfunction). - Aortic valve: Trileaflet; moderately thickened,  moderately calcified leaflets. - Aorta: Aortic root dimension: 38 mm (ED). - Ascending aorta: The ascending aorta was mildly dilated. - Right ventricle: The cavity size was mildly dilated. Wall thickness was normal. Systolic function was mildly reduced. - Right atrium: The atrium was mildly dilated. - Tricuspid valve: There was moderate regurgitation. - Pulmonary arteries: Systolic pressure was moderately increased. PA peak pressure: 52 mm Hg (S).  Patient Profile     71 y.o. male with CAD s/p redo CABG 4/17 (complicated by DVT/saddle PE, protracted respiratory failure), diastolic HF, HTN, HL, IDDM who presented with NSTEMI, found to have interval occlusion of the SVG to PDA and no options for revascularization.  Assessment & Plan    1. CAD s/p redo CABG and new NSTEMI: medical management. Ranexa dose just increased, will also titrate up his long acting nitrate. Meds limited by BP. Keep on clopidogrel indefinitely. 2. CHF: preserved EF. Elevated LVEDP at cath and weight above admission. Received some IVF for low BP over the weekend. Avoid sudden diuresis, start oral diuretics in low dose. 3. Hx DVT/PE: restart apixaban. Stop ASA. 4. CKD:  Baseline creatinine around 1.4. 5. HLP: needs aggressive lipid lowering, refer for PCSK9 inhibitor.  Signed, Thurmon FairMihai Cyerra Yim, MD  09/16/2016, 8:22 AM

## 2016-09-16 NOTE — Progress Notes (Signed)
ANTICOAGULATION CONSULT NOTE - Initial Consult  Pharmacy Consult for apixaban Indication: VTE prophylaxis history of PE  Allergies  Allergen Reactions  . Metformin And Related Nausea And Vomiting  . Pravachol Nausea And Vomiting  . Ramipril Nausea Only  . Wellbutrin [Bupropion Hcl] Nausea And Vomiting    Patient Measurements: Height: 6\' 3"  (190.5 cm) Weight: (!) 306 lb 14.4 oz (139.2 kg) IBW/kg (Calculated) : 84.5  Vital Signs: Temp: 98.1 F (36.7 C) (03/26 0419) Temp Source: Oral (03/26 0419) BP: 153/79 (03/26 0419) Pulse Rate: 89 (03/26 0419)  Labs:  Recent Labs  09/13/16 1031  09/14/16 0351 09/14/16 0931 09/14/16 1558 09/14/16 2140 09/15/16 0350 09/16/16 0247  HGB  --   < > 13.5  --   --   --  13.1 13.0  HCT  --   --  41.3  --   --   --  40.3 40.3  PLT  --   --  97*  --   --   --  99* 108*  APTT 62*  --   --  68*  --   --   --   --   HEPARINUNFRC  --   --   --  0.53  --   --  0.51  --   CREATININE  --   --   --   --   --   --  1.39* 1.41*  TROPONINI 2.26*  < > 2.23* 1.68* 1.50* 1.78* 1.23*  --   < > = values in this interval not displayed.  Estimated Creatinine Clearance: 71.3 mL/min (A) (by C-G formula based on SCr of 1.41 mg/dL (H)).   Medical History: Past Medical History:  Diagnosis Date  . Anemia   . Anxiety   . Arthritis    "back, neck" (07/11/2014)  . Chronic lower back pain   . Complication of anesthesia    "they have a hard time waking him up"  . Coronary artery disease    a. 2001: s/p CABG X4;  b. 08/2010 s/p PCI/BMS to VG->RI->OM;  c. 06/2014 Cath/PCI: LM nl, LAD 179m, LCX 63m, RI 100, OM1 100, OM2 80, small, RCA 100ost, VG->RCA 100, VG->Diag 100, VG->RI->OM1 70-80 ISR (4.0x24 Promus DES), LIMA->LAD nl.  . Depression   . Dyslipidemia   . Dysrhythmia   . Essential hypertension   . GERD (gastroesophageal reflux disease)   . History of blood transfusion 2001   "related to OHS"  . Morbid obesity (HCC)   . Nephrolithiasis   . RBBB   . Renal  insufficiency    a. noted 06/2014 - baseline unclear as no data since 2012.  . Thrombocytopenia (HCC)    a. Borderline low platelets by labs noted in 06/2014 but also noted on prior labs as well.  . Type II diabetes mellitus (HCC)     Medications:  Prescriptions Prior to Admission  Medication Sig Dispense Refill Last Dose  . amLODipine (NORVASC) 5 MG tablet Take 0.5 tablets (2.5 mg total) by mouth daily. 45 tablet 3 09/12/2016 at Unknown time  . apixaban (ELIQUIS) 5 MG TABS tablet Take 1 tablet (5 mg total) by mouth 2 (two) times daily. 60 tablet 11 09/12/2016 at 1030  . Artificial Tear Ointment (DRY EYES OP) Place 1 drop into both eyes daily as needed (for dry eyes).   Past Week at Unknown time  . busPIRone (BUSPAR) 15 MG tablet Take 15 mg by mouth daily.   09/12/2016 at Unknown time  . carvedilol (COREG)  25 MG tablet Take 0.5 tablets (12.5 mg total) by mouth 2 (two) times daily with a meal. 60 tablet 11 09/12/2016 at 1030  . CYMBALTA 60 MG capsule Take 60 mg by mouth at bedtime.    09/11/2016 at Unknown time  . ezetimibe (ZETIA) 10 MG tablet Take 1 tablet (10 mg total) by mouth daily. 30 tablet 11 09/12/2016 at Unknown time  . furosemide (LASIX) 40 MG tablet Take 1 tablet (40 mg total) by mouth daily. 30 tablet 11 09/12/2016 at Unknown time  . HYDROcodone-acetaminophen (NORCO) 10-325 MG tablet Take 1 tablet by mouth every 6 (six) hours as needed for moderate pain. (Patient taking differently: Take 0.5-2 tablets by mouth every 6 (six) hours as needed for moderate pain. ) 30 tablet 0 09/12/2016 at Unknown time  . insulin glargine (LANTUS) 100 UNIT/ML injection Inject 64 Units into the skin at bedtime.    09/11/2016 at Unknown time  . isosorbide mononitrate (IMDUR) 60 MG 24 hr tablet Take 60 mg by mouth daily.    09/12/2016 at Unknown time  . LINZESS 145 MCG CAPS capsule Take 145 mcg by mouth daily as needed (constipation).    Past Week at Unknown time  . mirtazapine (REMERON) 15 MG tablet Take 15 mg by  mouth at bedtime.    09/11/2016 at Unknown time  . nitroGLYCERIN (NITROSTAT) 0.4 MG SL tablet Place 1 tablet (0.4 mg total) under the tongue every 5 (five) minutes as needed for chest pain (up to 3 doses). 100 tablet 4 09/12/2016 at Unknown time  . NOVOLOG FLEXPEN 100 UNIT/ML FlexPen Inject 24 Units into the skin 2 (two) times daily.    09/12/2016 at Unknown time  . pantoprazole (PROTONIX) 40 MG tablet Take 1 tablet (40 mg total) by mouth daily. (Patient taking differently: Take 40 mg by mouth daily as needed (heartburn). )   Past Week at Unknown time  . potassium chloride SA (K-DUR,KLOR-CON) 20 MEQ tablet Take 1 tablet (20 mEq total) by mouth daily. 30 tablet 11 09/12/2016 at Unknown time  . QUEtiapine (SEROQUEL) 400 MG tablet Take 200 mg by mouth at bedtime.    09/11/2016 at Unknown time    Assessment: 70yom on apixaban pta for hx PE, transitioned to IV heparin for NSTEMI pending cath. S/P cath 3/23 found to have diffuse CAD with no good targets for PCI. Heparin resumed post cath for elevated troponins and continued chest pain. Troponins now trending down. CBC stable, Hgb 13, Plts up slight to 108. No bleeding reported. Pharmacy has been asked to resume apixaban.   Goal of Therapy:  aPTT 66-102 seconds Monitor platelets by anticoagulation protocol: Yes   Plan:  D/C heparin gtt Apixaban 5mg  BID  Ruben Imony Maliik Karner, PharmD Clinical Pharmacist Pager: [phone] 09/16/2016 8:53 AM

## 2016-09-16 NOTE — Plan of Care (Signed)
Problem: Activity: Goal: Ability to tolerate increased activity will improve Outcome: Not Progressing Intermittent Chest pain with activity

## 2016-09-16 NOTE — Progress Notes (Signed)
CARDIAC REHAB PHASE I   PRE:  Rate/Rhythm: 87 SR  BP:  Sitting: 124/56        SaO2: 97 2L  MODE:  Ambulation: 140 ft   POST:  Rate/Rhythm: 107 ST  BP:  Sitting: 139/62         SaO2: 97 2L  Pt ambulated 140 ft on 2L O2, rolling walker, assist x2, slow, mostly steady gait, tolerated fair. Pt c/o significant DOE, fatigue, 3-4/10 chest pressure upon return to room, relieved after a few minutes rest, however, pt did c/o 2-3/10 chest pressure at rest while sitting in recliner during education. Pt with very limited activity tolerance, however, states he is not interested in home health services at this time. Pt states he had home O2 at one point but he just stopped using it, states "It wasn't doing anything, my oxygen was fine." Will need walking O2 sat prior to d/c. Pt states his activity has been limited to walking to the bathroom in his home wtih significant DOE. Completed MI/CHF education with pt and wife at bedside.  Reviewed risk factors, MI book, anti-platelet therapy, activity restrictions, ntg, exercise, heart healthy diet, carb counting, portion control, CHF booklet, sodium restrictions, daily weights and phase 2 cardiac rehab. Pt and wife verbalized understanding, states he needs to make changes to his diet as he eats mostly fried foods. Pt agrees to phase 2 cardiac rehab referral, will send to [facility] per pt request. It is uncertain whether or not pt will be able to participate due to deconditioning, worsening angina with activity. Pt to recliner after walk, feet elevated, call bell within reach. Will follow.   [phone] Joylene GrapesEmily C Zohra Clavel, RN, BSN 09/16/2016 2:31 PM

## 2016-09-16 NOTE — Progress Notes (Signed)
Inpatient Diabetes Program Recommendations  AACE/ADA: New Consensus Statement on Inpatient Glycemic Control (2015)  Target Ranges:  Prepandial:   less than 140 mg/dL      Peak postprandial:   less than 180 mg/dL (1-2 hours)      Critically ill patients:  140 - 180 mg/dL   Lab Results  Component Value Date   GLUCAP 173 (H) 09/16/2016   HGBA1C 7.1 (H) 10/19/2015    Review of Glycemic Control Results for Tyler Le, Tyler Le (MRN [id number]) as of 09/16/2016 09:56  Ref. Range 09/15/2016 12:41 09/15/2016 16:43 09/15/2016 20:50 09/16/2016 06:13 09/16/2016 09:16  Glucose-Capillary Latest Ref Range: 65 - 99 mg/dL 782293 (H) 956281 (H) 213261 (H) 174 (H) 173 (H)   Diabetes history: DM2 Outpatient Diabetes medications: Lantus 64 U daily, Novolog 24 U BID Current orders for Inpatient glycemic control: Lantus 64 units + Novolog correction 0-20 units tid  Inpatient Diabetes Program Recommendations:  Please consider starting a portion of home meal coverage Novolog 10 units tid if eats 50%.  Thank you, Billy FischerJudy Le. Sereniti Wan, RN, MSN, CDE Inpatient Glycemic Control Team Team Pager [phone] (8am-5pm) 09/16/2016 9:57 AM

## 2016-09-16 NOTE — Progress Notes (Signed)
Patient with c/o chest pain 3/10 after returning from walk with cardiac rehab. EKG performed. Patient offered nitroglycerin but refused. Patient stated that he thought "it was because I had to burp". After which patient reported chest pain as 1/10. Nursing will continue to monitor.

## 2016-09-16 NOTE — Care Management Important Message (Signed)
Important Message  Patient Details  Name: Tyler Le MRN: [id number] Date of Birth: January 27, 1946   Medicare Important Message Given:  Yes    [name], Shaguana Love [name] 09/16/2016, 3:36 PM

## 2016-09-17 LAB — GLUCOSE, CAPILLARY
GLUCOSE-CAPILLARY: 207 mg/dL — AB (ref 65–99)
Glucose-Capillary: 181 mg/dL — ABNORMAL HIGH (ref 65–99)
Glucose-Capillary: 254 mg/dL — ABNORMAL HIGH (ref 65–99)
Glucose-Capillary: 262 mg/dL — ABNORMAL HIGH (ref 65–99)

## 2016-09-17 LAB — CBC
HCT: 38.5 % — ABNORMAL LOW (ref 39.0–52.0)
Hemoglobin: 12.5 g/dL — ABNORMAL LOW (ref 13.0–17.0)
MCH: 30.8 pg (ref 26.0–34.0)
MCHC: 32.5 g/dL (ref 30.0–36.0)
MCV: 94.8 fL (ref 78.0–100.0)
PLATELETS: 116 10*3/uL — AB (ref 150–400)
RBC: 4.06 MIL/uL — ABNORMAL LOW (ref 4.22–5.81)
RDW: 14.4 % (ref 11.5–15.5)
WBC: 5.4 10*3/uL (ref 4.0–10.5)

## 2016-09-17 LAB — BASIC METABOLIC PANEL
ANION GAP: 10 (ref 5–15)
BUN: 17 mg/dL (ref 6–20)
CALCIUM: 8.7 mg/dL — AB (ref 8.9–10.3)
CO2: 29 mmol/L (ref 22–32)
CREATININE: 1.58 mg/dL — AB (ref 0.61–1.24)
Chloride: 101 mmol/L (ref 101–111)
GFR, EST AFRICAN AMERICAN: 49 mL/min — AB (ref >60)
GFR, EST NON AFRICAN AMERICAN: 42 mL/min — AB (ref >60)
Glucose, Bld: 189 mg/dL — ABNORMAL HIGH (ref 65–99)
Potassium: 3.6 mmol/L (ref 3.5–5.1)
SODIUM: 140 mmol/L (ref 135–145)

## 2016-09-17 MED ORDER — FUROSEMIDE 10 MG/ML IJ SOLN
INTRAMUSCULAR | Status: AC
  Filled 2016-09-17: qty 4

## 2016-09-17 MED ORDER — FUROSEMIDE 10 MG/ML IJ SOLN
40.0000 mg | Freq: Once | INTRAMUSCULAR | Status: AC
  Administered 2016-09-17: 40 mg via INTRAVENOUS
  Filled 2016-09-17: qty 4

## 2016-09-17 NOTE — Progress Notes (Signed)
Pt ambulated 27550ft on room air. Pt's oxygen saturation remained 93%-96% during ambulation.   Berdine DanceLauren Moffitt BSN, RN

## 2016-09-17 NOTE — Progress Notes (Signed)
Progress Note  Patient Name: Tyler Le Date of Encounter: 09/17/2016  Primary Cardiologist: Dr. Elease Hashimoto  Subjective   Pt states he was still having chest pain last night but it is much improved today. He has some mild chest pain and arm pain with ambulation today. Increased dose of ranexa started last evening at bedtime.  Inpatient Medications    Scheduled Meds: . amLODipine  2.5 mg Oral Daily  . apixaban  5 mg Oral BID  . carvedilol  12.5 mg Oral BID WC  . clopidogrel  75 mg Oral Q24H  . ezetimibe  10 mg Oral Daily  . furosemide  40 mg Oral Daily  . insulin aspart  0-20 Units Subcutaneous TID WC  . insulin glargine  64 Units Subcutaneous QHS  . isosorbide mononitrate  60 mg Oral Daily  . pantoprazole  40 mg Oral Daily  . QUEtiapine  200 mg Oral QHS  . ranolazine  1,000 mg Oral BID  . sodium chloride flush  3 mL Intravenous Q12H   Continuous Infusions:  PRN Meds: sodium chloride, acetaminophen, morphine injection, nitroGLYCERIN, ondansetron (ZOFRAN) IV, sodium chloride flush   Vital Signs    Vitals:   09/16/16 1450 09/16/16 1947 09/17/16 0606 09/17/16 0946  BP: 136/63 (!) 148/70 136/61 (!) 128/54  Pulse: 85 95 87 88  Resp:      Temp:  98.5 F (36.9 C) 98.8 F (37.1 C)   TempSrc:  Oral Oral   SpO2:  95% 96%   Weight:      Height:       No intake or output data in the 24 hours ending 09/17/16 1122 Filed Weights   09/12/16 1819 09/12/16 2221 09/16/16 0419  Weight: 290 lb (131.5 kg) (!) 302 lb 9.6 oz (137.3 kg) (!) 306 lb 14.4 oz (139.2 kg)     Physical Exam   General: Well developed, well nourished, male appearing in no acute distress. Head: Normocephalic, atraumatic.  Neck: Supple without bruits, JVD Lungs:  Resp regular and unlabored, CTA, diminished in bases Heart: RRR, S1, S2, no murmur; no rub. Abdomen: Soft, non-tender, non-distended with normoactive bowel sounds. No hepatomegaly. No rebound/guarding. No obvious abdominal masses. Extremities:  No clubbing, cyanosis, 1+ edema B LE. Distal pedal pulses are 2+ bilaterally. Neuro: Alert and oriented X 3. Moves all extremities spontaneously. Psych: Normal affect.  Labs    Chemistry Recent Labs Lab 09/15/16 0350 09/16/16 0247 09/17/16 0446  NA 139 138 140  K 3.9 3.9 3.6  CL 104 102 101  CO2 GLUCOSE 206* 178* 189*  BUN CREATININE 1.39* 1.41* 1.58*  CALCIUM 8.5* 8.6* 8.7*  GFRNONAA 49* 48* 42*  GFRAA 57* 56* 49*  ANIONGAP Hematology Recent Labs Lab 09/15/16 0350 09/16/16 0247 09/17/16 0446  WBC 5.1 6.0 5.4  RBC 4.26 4.23 4.06*  HGB 13.1 13.0 12.5*  HCT 40.3 40.3 38.5*  MCV 94.6 95.3 94.8  MCH 30.8 30.7 30.8  MCHC 32.5 32.3 32.5  RDW 14.3 14.6 14.4  PLT 99* 108* 116*    Cardiac Enzymes Recent Labs Lab 09/14/16 0931 09/14/16 1558 09/14/16 2140 09/15/16 0350  TROPONINI 1.68* 1.50* 1.78* 1.23*    Recent Labs Lab 09/12/16 1640  TROPIPOC 0.65*     BNP Recent Labs Lab 09/12/16 1735  BNP 124.3*     DDimer No results for input(s): DDIMER in the last 168 hours.   Radiology  No results found.   Telemetry    Question AV block (Mobitz I); EKG pending - Personally Reviewed  ECG    09/16/16 sinus rhythm - Personally Reviewed  09/17/16: pending  Cardiac Studies   Cath 09/13/16  Diagnostic Diagram      Echo: 6/17  Study Conclusions  - Left ventricle: The cavity size was normal. Wall thickness was increased in a pattern of moderate LVH. Systolic function was normal. The estimated ejection fraction was in the range of 55% to 60%. Wall motion was normal; there were no regional wall motion abnormalities. Features are consistent with a pseudonormal left ventricular filling pattern, with concomitant abnormal relaxation and increased filling pressure (grade 2 diastolic dysfunction). - Aortic valve: Trileaflet; moderately thickened, moderately calcified leaflets. - Aorta: Aortic root  dimension: 38 mm (ED). - Ascending aorta: The ascending aorta was mildly dilated. - Right ventricle: The cavity size was mildly dilated. Wall thickness was normal. Systolic function was mildly reduced. - Right atrium: The atrium was mildly dilated. - Tricuspid valve: There was moderate regurgitation. - Pulmonary arteries: Systolic pressure was moderately increased. PA peak pressure: 52 mm Hg (S).   Patient Profile     71 y.o. male with CAD s/p redo CABG 4/17 (complicated by DVT/saddle PE, protracted respiratory failure), diastolic HF, HTN, HL, IDDM who presented with NSTEMI, found to have interval occlusion of the SVG to PDA and no options for revascularization.  Assessment & Plan    1. CAD s/p redo CABG and new NSTEMI: medical management. Ranexa dose was increased yesterday; but patient is still having exertional chest pain. Keep on clopidogrel indefinitely. - Meds limited by BP; however, sBP 120-140s; consider increasing imdur to 90 daily.  2. CHF: preserved EF. Elevated LVEDP at cath and weight above admission. Received some IVF for low BP over the weekend. Avoid sudden diuresis, start oral diuretics in low dose.  - wt up to 306 lbs (290 lbs on admission); overall net negative 2.5L - needs strict I&Os - will consider increasing lasix tomorrow as BP tolerates adjustment to nitrate  3. Hx DVT/PE: restart apixaban. Stop ASA.  4. CKD:  Baseline creatinine around 1.4.  5. HLP: needs aggressive lipid lowering, refer for PCSK9 inhibitor.  6. New AV block - possibly Mobitz I on telemetry review - EKG pending  Signed, Marcelino Duster , PA-C 11:22 AM 09/17/2016 Pager: [phone]  I have seen and examined the patient along with Marcelino Duster, PA.  I have reviewed the chart, notes and new data.  I agree with PA's note.  Key new complaints: angina greatly improved, better exercise capacity, but has some exertional dyspnea. Key examination changes: 2+ leg edema; roughly  15-20 lb above usual weight Key new findings / data: creatinine similar to admission, slightly above baseline (mild acute on chronic renal insufficiency). LDL 43, but very low HDL and high TG. A1c a year ago was 7.1%. 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 On max Ranexa.  There may be room to increase the amlodipine, but he was hypotensive over the weekend and received IV fluids. Has symptoms of mild CHF. Diurese first, reassess need for more amlodipine or isosorbide after diuresis, depending on symptoms and BP. Recheck renal function on AM labs.  Thurmon Fair, MD, [facility] [phone] 09/17/2016, 1:03 PM

## 2016-09-17 NOTE — Progress Notes (Signed)
CARDIAC REHAB PHASE I  Pt states he just returned from walking with his wife, states he has walked three times today, declines ambulation with cardiac rehab at this time. Pt states he has no questions regarding education at this time, will follow up tomorrow.  Joylene GrapesEmily C Myer Bohlman, RN, BSN 09/17/2016 1:59 PM

## 2016-09-18 ENCOUNTER — Ambulatory Visit: Payer: Medicare Other | Admitting: Cardiovascular Disease

## 2016-09-18 ENCOUNTER — Encounter (HOSPITAL_COMMUNITY): Payer: Self-pay | Admitting: Cardiology

## 2016-09-18 DIAGNOSIS — I5031 Acute diastolic (congestive) heart failure: Secondary | ICD-10-CM

## 2016-09-18 HISTORY — DX: Acute diastolic (congestive) heart failure: I50.31

## 2016-09-18 LAB — GLUCOSE, CAPILLARY: Glucose-Capillary: 194 mg/dL — ABNORMAL HIGH (ref 65–99)

## 2016-09-18 LAB — BASIC METABOLIC PANEL
Anion gap: 7 (ref 5–15)
BUN: 17 mg/dL (ref 6–20)
CALCIUM: 8.6 mg/dL — AB (ref 8.9–10.3)
CO2: 32 mmol/L (ref 22–32)
CREATININE: 1.61 mg/dL — AB (ref 0.61–1.24)
Chloride: 100 mmol/L — ABNORMAL LOW (ref 101–111)
GFR calc Af Amer: 48 mL/min — ABNORMAL LOW (ref >60)
GFR calc non Af Amer: 41 mL/min — ABNORMAL LOW (ref >60)
Glucose, Bld: 200 mg/dL — ABNORMAL HIGH (ref 65–99)
POTASSIUM: 3.4 mmol/L — AB (ref 3.5–5.1)
SODIUM: 139 mmol/L (ref 135–145)

## 2016-09-18 LAB — CBC
HEMATOCRIT: 40 % (ref 39.0–52.0)
Hemoglobin: 13.1 g/dL (ref 13.0–17.0)
MCH: 30.4 pg (ref 26.0–34.0)
MCHC: 32.8 g/dL (ref 30.0–36.0)
MCV: 92.8 fL (ref 78.0–100.0)
PLATELETS: 135 10*3/uL — AB (ref 150–400)
RBC: 4.31 MIL/uL (ref 4.22–5.81)
RDW: 14 % (ref 11.5–15.5)
WBC: 5.2 10*3/uL (ref 4.0–10.5)

## 2016-09-18 LAB — HEMOGLOBIN A1C
HEMOGLOBIN A1C: 7.4 % — AB (ref 4.8–5.6)
Mean Plasma Glucose: 166 mg/dL

## 2016-09-18 MED ORDER — AMLODIPINE BESYLATE 2.5 MG PO TABS: 2.5000 mg | ORAL_TABLET | Freq: Every day | ORAL | 6 refills | Status: AC

## 2016-09-18 MED ORDER — CARVEDILOL 12.5 MG PO TABS: 12.5000 mg | ORAL_TABLET | Freq: Two times a day (BID) | ORAL | 6 refills | Status: DC

## 2016-09-18 MED ORDER — NITROGLYCERIN 0.4 MG SL SUBL: 0.4000 mg | SUBLINGUAL_TABLET | SUBLINGUAL | 4 refills | Status: AC | PRN

## 2016-09-18 MED ORDER — RANOLAZINE ER 1000 MG PO TB12: 1000.0000 mg | ORAL_TABLET | Freq: Two times a day (BID) | ORAL | 6 refills | Status: AC

## 2016-09-18 MED ORDER — POTASSIUM CHLORIDE ER 10 MEQ PO TBCR: 20.0000 meq | EXTENDED_RELEASE_TABLET | Freq: Every day | ORAL | Status: DC

## 2016-09-18 MED ORDER — CLOPIDOGREL BISULFATE 75 MG PO TABS: 75.0000 mg | ORAL_TABLET | ORAL | 6 refills | Status: AC

## 2016-09-18 MED ORDER — POTASSIUM CHLORIDE CRYS ER 20 MEQ PO TBCR
40.0000 meq | EXTENDED_RELEASE_TABLET | Freq: Once | ORAL | Status: AC
  Administered 2016-09-18: 40 meq via ORAL
  Filled 2016-09-18: qty 2

## 2016-09-18 MED ORDER — ACETAMINOPHEN 325 MG PO TABS: 650.0000 mg | ORAL_TABLET | ORAL | Status: AC | PRN

## 2016-09-18 NOTE — Discharge Instructions (Signed)
Call [facility] at [phone] if any bleeding, swelling or drainage at cath site.  May shower, no tub baths for 48 hours for groin sticks. No lifting over 5 pounds for 3 days.  No Driving for 3 days  Take 1 NTG, under your tongue, while sitting.  If no relief of pain may repeat NTG, one tab every 5 minutes up to 3 tablets total over 15 minutes.  If no relief CALL 911.  If you have dizziness/lightheadness  while taking NTG, stop taking and call 911.         Heart Healthy Diabetic Diet   Call if any problems with meds or questions.  We added new meds and new doses on old meds.   You are to be seen in Lipid clinic next week to help with cholesterol.

## 2016-09-18 NOTE — Progress Notes (Signed)
Progress Note  Patient Name: Tyler Le Date of Encounter: 09/18/2016  Primary Cardiologist: Nahser  Subjective   Walked 3 laps without angina yesterday. Had to stop due to back and hip pain, not cardiac symptoms. Good urine output, but weight still well above preadmission weight.  Inpatient Medications    Scheduled Meds: . amLODipine  2.5 mg Oral Daily  . apixaban  5 mg Oral BID  . carvedilol  12.5 mg Oral BID WC  . clopidogrel  75 mg Oral Q24H  . ezetimibe  10 mg Oral Daily  . furosemide  40 mg Oral Daily  . insulin aspart  0-20 Units Subcutaneous TID WC  . insulin glargine  64 Units Subcutaneous QHS  . isosorbide mononitrate  60 mg Oral Daily  . pantoprazole  40 mg Oral Daily  . QUEtiapine  200 mg Oral QHS  . ranolazine  1,000 mg Oral BID  . sodium chloride flush  3 mL Intravenous Q12H   Continuous Infusions:  PRN Meds: sodium chloride, acetaminophen, morphine injection, nitroGLYCERIN, ondansetron (ZOFRAN) IV, sodium chloride flush   Vital Signs    Vitals:   09/17/16 0946 09/17/16 1300 09/17/16 2036 09/18/16 0448  BP: (!) 128/54 (!) 108/57 (!) 98/52 (!) 162/78  Pulse: 88 85 86 (!) 101  Resp:  20 18 18   Temp:  97.5 F (36.4 C) 98.5 F (36.9 C) 98.4 F (36.9 C)  TempSrc:  Oral Oral Oral  SpO2:  94% 95% 92%  Weight:      Height:        Intake/Output Summary (Last 24 hours) at 09/18/16 0823 Last data filed at 09/18/16 0449  Gross per 24 hour  Intake              840 ml  Output             1700 ml  Net             -860 ml   Filed Weights   09/12/16 1819 09/12/16 2221 09/16/16 0419  Weight: 131.5 kg (290 lb) (!) 137.3 kg (302 lb 9.6 oz) (!) 139.2 kg (306 lb 14.4 oz)    Telemetry    NSR - Personally Reviewed  Physical Exam  Comfortable GEN: No acute distress.   Neck: No JVD Cardiac: RRR, no murmurs, rubs, or gallops.  Respiratory: Clear to auscultation bilaterally. GI: Soft, nontender, non-distended  MS: No edema; No deformity. Neuro:   Nonfocal  Psych: Normal affect   Labs    Chemistry Recent Labs Lab 09/16/16 0247 09/17/16 0446 09/18/16 0308  NA 138 140 139  K 3.9 3.6 3.4*  CL 102 101 100*  CO2 27 29 32  GLUCOSE 178* 189* 200*  BUN 12 17 17   CREATININE 1.41* 1.58* 1.61*  CALCIUM 8.6* 8.7* 8.6*  GFRNONAA 48* 42* 41*  GFRAA 56* 49* 48*  ANIONGAP 9 10 7      Hematology Recent Labs Lab 09/16/16 0247 09/17/16 0446 09/18/16 0308  WBC 6.0 5.4 5.2  RBC 4.23 4.06* 4.31  HGB 13.0 12.5* 13.1  HCT 40.3 38.5* 40.0  MCV 95.3 94.8 92.8  MCH 30.7 30.8 30.4  MCHC 32.3 32.5 32.8  RDW 14.6 14.4 14.0  PLT 108* 116* 135*    Cardiac Enzymes Recent Labs Lab 09/14/16 0931 09/14/16 1558 09/14/16 2140 09/15/16 0350  TROPONINI 1.68* 1.50* 1.78* 1.23*    Recent Labs Lab 09/12/16 1640  TROPIPOC 0.65*     BNP Recent Labs Lab 09/12/16 1735  BNP  124.3*      Cardiac Studies   Cath 09/13/16  Diagnostic Diagram      Echo: 6/17  Study Conclusions  - Left ventricle: The cavity size was normal. Wall thickness was increased in a pattern of moderate LVH. Systolic function was normal. The estimated ejection fraction was in the range of 55% to 60%. Wall motion was normal; there were no regional wall motion abnormalities. Features are consistent with a pseudonormal left ventricular filling pattern, with concomitant abnormal relaxation and increased filling pressure (grade 2 diastolic dysfunction). - Aortic valve: Trileaflet; moderately thickened, moderately calcified leaflets. - Aorta: Aortic root dimension: 38 mm (ED). - Ascending aorta: The ascending aorta was mildly dilated. - Right ventricle: The cavity size was mildly dilated. Wall thickness was normal. Systolic function was mildly reduced. - Right atrium: The atrium was mildly dilated. - Tricuspid valve: There was moderate regurgitation. - Pulmonary arteries: Systolic pressure was moderately increased. PA peak  pressure: 52 mm Hg (S).   Patient Profile     71 y.o. male with CAD s/p redo CABG 4/17 (complicated by DVT/saddle PE, protracted respiratory failure), diastolic HF, HTN, HL, IDDM who presented with NSTEMI, found to have interval occlusion of the SVG to PDA and no options for revascularization.  Assessment & Plan    1. CAD s/p redo CABG and new NSTEMI: medical management. We seem to have reached a reasonable control of exertional angina. Suspect breakthrough events will occasionally occur. Educated the patient about the distinction between provoked, predictable stable angina and unprovoked prolonged unstable angina and how to respond to each.  Meds limited by BP and Mobitz type 1 AVB.  Keep on clopidogrel indefinitely. 2. CHF: preserved EF. Elevated LVEDP at cath and weight above admission.  Avoid sudden diuresis, gradually reduce fluid load with oral diuretics as an outpt.. 3. Hx DVT/PE: restarted apixaban. Stop ASA. 4. CKD:  Baseline creatinine around 1.4. Mildly higher after cath and episode of hypotension over the weekend, but no longer rising. 5. HLP: His LDL is actually quite low. Has metabolic sd pattern with low HDl and elevated TG. Weight loss would be beneficial, his ability to exercise is limited by angina and orthopedic problems.  DC home today, TCM follow up in 1-2 weeks.  Signed, Thurmon FairMihai Chestine Belknap, MD  09/18/2016, 8:23 AM

## 2016-09-18 NOTE — Progress Notes (Signed)
Pt has left the floor via wheelchair with no signs of distress. Pt verbalizes discharge instructions.

## 2016-09-18 NOTE — Care Management Note (Signed)
Case Management Note Donn PieriniKristi Emmit Oriley RN, BSN Unit 2W-Case Manager [phone]  Patient Details  Name: Tyler Le MRN: [id number] Date of Birth: [birth year]  Subjective/Objective:  Pt admitted with NSTEMI                 Action/Plan: PTA pt lived at home- plan to return home- no CM needs noted for discharge.   Expected Discharge Date:  09/18/16               Expected Discharge Plan:  Home/Self Care  In-House Referral:     Discharge planning Services  CM Consult  Post Acute Care Choice:  NA Choice offered to:  NA  DME Arranged:    DME Agency:     HH Arranged:    HH Agency:     Status of Service:  Completed, signed off  If discussed at [employer] Length of Stay Meetings, dates discussed:    Additional Comments:  Tyler Le, Tyler Nancarrow Hall, RN 09/18/2016, 10:14 AM

## 2016-09-18 NOTE — Discharge Summary (Signed)
Discharge Summary    Patient ID: Tyler Le,  MRN: [id number], DOB/AGE: 12-02-1945 71 y.o.  Admit date: 09/12/2016 Discharge date: 09/18/2016  Primary Care Provider: Leonard Downing Primary Cardiologist: Dr. Acie Fredrickson  Discharge Diagnoses    Principal Problem:   NSTEMI (non-ST elevated myocardial infarction) [facility]) Active Problems:   Coronary artery disease involving coronary bypass graft of native heart with angina pectoris ([place])   Dyslipidemia   CAD (coronary artery disease) with re-do CABG 09/2015 (first CABG in 2001)    Hypertension   Essential hypertension   Diabetes mellitus type 2 in obese (HCC)   OSA (obstructive sleep apnea)   Acute diastolic HF (heart failure) (HCC)   Allergies Allergies  Allergen Reactions  . Metformin And Related Nausea And Vomiting  . Pravachol Nausea And Vomiting  . Ramipril Nausea Only  . Wellbutrin [Bupropion Hcl] Nausea And Vomiting    Diagnostic Studies/Procedures    Cardiac cath 09/13/16 by Dr. Ellyn Hack  Conclusion     Prox LAD ~70 %stenosed. Mid LAD lesion, 100 %stenosed.  The distal LAD is filled via the LIMA graft. The distal LAD is diffusely diseased. Dist LAD-1 lesion, 80 %stenosed. Dist LAD-2 lesion, 80 %stenosed.  The New SVG-dLAD from the hood of the new SVG-OM2 is 100% occluded at the hood.  Ost 1st Diag lesion, 95 %stenosed. 2nd Diag lesion, 100 %stenosed.- the prior SCG-Diag is known to be occluded.  Ramus lesion, 100 %stenosed. Fills via old SVG with Ostial Stent.  Old SVG-Ramus Intermedius: Origin to Mid Graft DES, 45 %stenosed.  Prox Cx to Dist Cx lesion, 95 %stenosed. ~At the takeoff of OM2  Ost 1st Mrg to 1st Mrg lesion, 100 %stenosed.  New SVG-OM1 and is normal in caliber and anatomically normal. Fills a small caliber diffusely diseased OM branch with backfilling to the native diseased AV Groove Cx.  Ost RCA to Dist RCA lesion, 100 %stenosed. -The posterior lateral segment branches and RPDA are  filled via left to right collaterals from the AV groove circumflex it is diffusely diseased.  New SVG-RCA: Prox Graft lesion, 100 %stenosed.  The left ventricular systolic function is normal. The left ventricular ejection fraction is 50-55% by visual estimate.  LV end diastolic pressure is moderately elevated.   The patient has severe diffuse native coronary disease with very poor PCI targets. He has now 2 out of the 3 new vein grafts that are occluded including the new vein graft to RCA and new vein graft to distal LAD. In addition to the new vein graft disease, he has progression of disease in the native LAD-diagonal system as well as the AV groove circumflex system. The AV groove system arise collaterals to the distal RCA.   Unfortunately, there are not any immediately favorable PCI targets.   Recommendation would be to optimize medical management.  Plan:  Return to nursing unit for TR band removal.  Would restart IV heparin given positive troponins.   Defer to primary service for titration of cardiac medications.  I will write for a one-time dose of IV Lasix tonight given elevated LVEDP. He may require additional diuresis.    Diagnostic Diagram      Echo: 6/17  Study Conclusions  - Left ventricle: The cavity size was normal. Wall thickness was increased in a pattern of moderate LVH. Systolic function was normal. The estimated ejection fraction was in the range of 55% to 60%. Wall motion was normal; there were no regional wall motion abnormalities. Features are consistent  with a pseudonormal left ventricular filling pattern, with concomitant abnormal relaxation and increased filling pressure (grade 2 diastolic dysfunction). - Aortic valve: Trileaflet; moderately thickened, moderately calcified leaflets. - Aorta: Aortic root dimension: 38 mm (ED). - Ascending aorta: The ascending aorta was mildly dilated. - Right ventricle: The cavity size was  mildly dilated. Wall thickness was normal. Systolic function was mildly reduced. - Right atrium: The atrium was mildly dilated. - Tricuspid valve: There was moderate regurgitation. - Pulmonary arteries: Systolic pressure was moderately increased. PA peak pressure: 52 mm Hg (S).   _____________   History of Present Illness     71 yo male with PMH of CAD s/p redo CABG 4/17, ICM, HTN, HL, IDDM, and saddle PE who presented to the ED with reports of chest pain.   He originally underwent 4v CABG in 2001. In then in march of 2012 had a BMS placed to the SVG to the intermediate and obtuse marginal branch. Was seen again in 12/15 reporting chest pain. Had a lexiscan myoview that showed reversible defect along the mid to distal anterior wall. Cardiac catheterization was recommended at that time. In 1/16 he underwent LHC with Dr. Martinique with occluded SVG to diag, SVG to RCA, restenosis in the BMS to ramus/OM with new DES placed to this area. Placed on ASA and Brilinta indefinitely.   In 4/17 he reported ongoing exertional intrascapular pain and severe DOE. Work up initially negative, but presented back and underwent redo CABG in 5/17. This was complicated by respiratory failure, requiring a trach. Was then hospitalized again in 6/17 with a saddle PE. Was placed on Eliquis 25m BID. Was last seen in the office on 10/17 by Dr. NAcie Fredrickson He reported being back in his usual state of health. His medications were continued the same.   Developed exertional chest pain about a week prior to admit along with dyspnea on exertion. Reported this is the same pain he had experienced with previous cardiac events. Stated he recently had some dental work done and was off his Eliquis for the past 7 days. He called the office today with his symptoms and was encouraged to come to the ED for further work up.   In ER his troponin was up to 1.54 other labs normal.  CXR neg and EKG showed SR RBBB with new ST depression in v2  v3. He was taking SL nitro at home that relieved his pain for short periods of time. In the ED was given 3 SL nitro, and was chest pain free on exam.  Placed on IV Heparin and plan for admit and cath.      Hospital Course     Consultants: none  Cardiac cath see above  Interval occlusion of the SVG to PDA and no options for revascularization  Post cath pt continued with episodic angina- mostly with exertion.  Pt was on amlodipine, coreg, plavix zetia IV heparin and imdur along with ASA.  Ranexa was then added. Also BP soft so dose of Imdur was decreased.      Pt continued with episodic angina with rest and exertion, but also improved BP so imdur increased and ranexa increased to 1000 mg daily.  His Heparin was stopped and Eliquis resumed for hx of DVT/PE.  His diabetes was stable with glucose 170s to 232 on lantus and SSI.  And back to insulin with meals.    He has intolerance to statins and will need to be referred for PCSK9.     Troponin pk.  2.26.  Prior to d/c to 1.23.    CHF: preserved EF. Elevated LVEDP at cath and weight above admission. Received some IVF for low BP over the weekend. Avoid sudden diuresis, start oral diuretics in low dose.    By discharge he could walk without angina.  Weight was still above admit wt. But negative 3,425.  Wt 306 lbs at discharge.  Was 290 on admit but with different scales 302.   Pt has been seen and evaluated by Dr. Jerilynn Mages. Croitoru and found stable for discharge.   Cardiac rehab has discussed exercise, rehab and NTG with pt.   _____________  Discharge Vitals Blood pressure (!) 162/78, pulse (!) 101, temperature 98.4 F (36.9 C), temperature source Oral, resp. rate 18, height _0  (1.905 m), weight (!) 306 lb 14.4 oz (139.2 kg), SpO2 92 %.  Filed Weights   09/12/16 1819 09/12/16 2221 09/16/16 0419  Weight: 290 lb (131.5 kg) (!) 302 lb 9.6 oz (137.3 kg) (!) 306 lb 14.4 oz (139.2 kg)    Labs & Radiologic Studies    CBC  Recent Labs   09/17/16 0446 09/18/16 0308  WBC 5.4 5.2  HGB 12.5* 13.1  HCT 38.5* 40.0  MCV 94.8 92.8  PLT 116* 629*   Basic Metabolic Panel  Recent Labs  09/17/16 0446 09/18/16 0308  NA 140 139  K 3.6 3.4*  CL 101 100*  CO2 29 32  GLUCOSE 189* 200*  BUN 17 17  CREATININE 1.58* 1.61*  CALCIUM 8.7* 8.6*   Liver Function Tests Hepatic Function Latest Ref Rng & Units 11/07/2015 11/02/2015 11/01/2015  Total Protein 6.5 - 8.1 g/dL 6.4(L) 6.7 6.5  Albumin 3.5 - 5.0 g/dL 2.8(L) 2.6(L) 2.3(L)  AST 15 - 41 U/L 31 52(H) 66(H)  ALT 17 - 63 U/L 65(H) 76(H) 83(H)  Alk Phosphatase 38 - 126 U/L 106 84 86  Total Bilirubin 0.3 - 1.2 mg/dL 0.6 1.6(H) 1.7(H)  Bilirubin, Direct 0.1 - 0.5 mg/dL - - 0.8(H)    Cardiac Enzymes pk 2.74 decreasing prior to discharge.    BNP (last 3 results)  Recent Labs  12/02/15 1719 09/12/16 1735  BNP 64.3 124.3*     D-Dimer No results for input(s): DDIMER in the last 72 hours. Hemoglobin A1C--in process at discharge  No results for input(s): HGBA1C in the last 72 hours. Fasting Lipid Panel Lipid Panel     Component Value Date/Time   CHOL 129 09/13/2016 0345   TRIG 299 (H) 09/13/2016 0345   HDL 26 (L) 09/13/2016 0345   CHOLHDL 5.0 09/13/2016 0345   VLDL 60 (H) 09/13/2016 0345   LDLCALC 43 09/13/2016 0345   Thyroid Function Tests No results for input(s): TSH, T4TOTAL, T3FREE, THYROIDAB in the last 72 hours.  Invalid input(s): FREET3 _____________  Dg Chest 2 View  Result Date: 09/12/2016 CLINICAL DATA:  Chest pain. EXAM: CHEST  2 VIEW COMPARISON:  03/20/2016 . FINDINGS: Prior CABG. Cardiomegaly with normal pulmonary vascularity. No focal infiltrate. No pleural effusion or pneumothorax. Degenerative changes thoracic spine. IMPRESSION: 1.  Prior CABG.  Cardiomegaly. 2. No acute pulmonary disease . Electronically Signed   By: Marcello Moores  Register   On: 09/12/2016 16:50   Disposition   Pt is being discharged home today in good condition.  Follow-up Plans &  Appointments    Follow-up Information    Mertie Moores, MD Follow up on 09/26/2016.   Specialty:  Cardiology Why:  at 2:00 PM  with Vin Dr. Elmarie Shiley PA Contact information: [phone] [place] [address] [phone]        [place] [facility] Follow up on 09/26/2016.   Specialty:  Cardiology Why:   at 3:00 PM for lipid clinic.   Contact information: [address] [place] [phone]         Discharge Instructions    Amb Referral to Cardiac Rehabilitation    Complete by:  As directed    Diagnosis:  NSTEMI Comment - To Radar Base    Call [facility] at [phone] if any bleeding, swelling or drainage at cath site.  May shower, no tub baths for 48 hours for groin sticks. No lifting over 5 pounds for 3 days.  No Driving for 3 days  Take 1 NTG, under your tongue, while sitting.  If no relief of pain may repeat NTG, one tab every 5 minutes up to 3 tablets total over 15 minutes.  If no relief CALL 911.  If you have dizziness/lightheadness  while taking NTG, stop taking and call 911.         Heart Healthy Diabetic Diet   Call if any problems with meds or questions.  We added new meds and new doses on old meds.   You are to be seen in Lipid clinic next week to help with cholesterol.     Discharge Medications   Current Discharge Medication List    START taking these medications   Details  acetaminophen (TYLENOL) 325 MG tablet Take 2 tablets (650 mg total) by mouth every 4 (four) hours as needed for headache or mild pain.    clopidogrel (PLAVIX) 75 MG tablet Take 1 tablet (75 mg total) by mouth daily. Qty: 30 tablet, Refills: 6    ranolazine (RANEXA) 1000 MG SR tablet Take 1 tablet (1,000 mg total) by mouth 2 (two) times daily. Qty: 60 tablet, Refills: 6      CONTINUE these medications which have CHANGED   Details  amLODipine (NORVASC) 2.5 MG tablet Take 1 tablet (2.5 mg  total) by mouth daily. Qty: 30 tablet, Refills: 6    carvedilol (COREG) 12.5 MG tablet Take 1 tablet (12.5 mg total) by mouth 2 (two) times daily with a meal. Qty: 60 tablet, Refills: 6    nitroGLYCERIN (NITROSTAT) 0.4 MG SL tablet Place 1 tablet (0.4 mg total) under the tongue every 5 (five) minutes as needed for chest pain. Qty: 25 tablet, Refills: 4      CONTINUE these medications which have NOT CHANGED   Details  apixaban (ELIQUIS) 5 MG TABS tablet Take 1 tablet (5 mg total) by mouth 2 (two) times daily. Qty: 60 tablet, Refills: 11    Artificial Tear Ointment (DRY EYES OP) Place 1 drop into both eyes daily as needed (for dry eyes).    busPIRone (BUSPAR) 15 MG tablet Take 15 mg by mouth daily.    CYMBALTA 60 MG capsule Take 60 mg by mouth at bedtime.     ezetimibe (ZETIA) 10 MG tablet Take 1 tablet (10 mg total) by mouth daily. Qty: 30 tablet, Refills: 11    furosemide (LASIX) 40 MG tablet Take 1 tablet (40 mg total) by mouth daily. Qty: 30 tablet, Refills: 11    HYDROcodone-acetaminophen (NORCO) 10-325 MG tablet Take 1 tablet by mouth every 6 (six) hours as needed for moderate pain. Qty: 30 tablet, Refills: 0    insulin glargine (LANTUS) 100 UNIT/ML injection Inject 64 Units into  the skin at bedtime.     isosorbide mononitrate (IMDUR) 60 MG 24 hr tablet Take 60 mg by mouth daily.     LINZESS 145 MCG CAPS capsule Take 145 mcg by mouth daily as needed (constipation).     mirtazapine (REMERON) 15 MG tablet Take 15 mg by mouth at bedtime.     NOVOLOG FLEXPEN 100 UNIT/ML FlexPen Inject 24 Units into the skin 2 (two) times daily.     pantoprazole (PROTONIX) 40 MG tablet Take 1 tablet (40 mg total) by mouth daily.    potassium chloride SA (K-DUR,KLOR-CON) 20 MEQ tablet Take 1 tablet (20 mEq total) by mouth daily. Qty: 30 tablet, Refills: 11    QUEtiapine (SEROQUEL) 400 MG tablet Take 200 mg by mouth at bedtime.            Outstanding Labs/Studies   Will need follow  up BMP and CBC on OV.   Duration of Discharge Encounter   Greater than 30 minutes including physician time.  Signed, Cecilie Kicks NP 09/18/2016, 9:49 AM

## 2016-09-24 NOTE — Progress Notes (Signed)
Cardiology Office Note    Date:  09/26/2016   ID:  Tyler Le, DOB 13-Oct-1945, MRN [id number]  PCP:  Kaleen Mask, MD  Cardiologist:  Dr. Elease Hashimoto  Chief Complaint: Hospital follow up s/p  NSTEMI  History of Present Illness:   Tyler Le is a 70 y.o. male CAD s/p redo CABG 4/17, ICM, HTN, HL, IDDM, GERD and saddle PE on Eliquis presents for hospital follow up.  He originally underwent 4v CABG in 2001. In then in march of 2012 had a BMS placed to the SVG to the intermediate and obtuse marginal branch. Was seen again in 12/15 reporting chest pain. Had a lexiscan myoview that showed reversible defect along the mid to distal anterior wall. Cardiac catheterization was recommended at that time. In 1/16 he underwent LHC with Dr. Swaziland with occluded SVG to diag, SVG to RCA, restenosis in the BMS to ramus/OM with new DES placed to this area. Placed on ASA and Brilinta indefinitely.   In 4/17 he reported ongoing exertional intrascapular pain and severe DOE. Work up initially negative, but presented back and underwent redo CABG in 5/17. This was complicated by respiratory failure, requiring a trach. Was then hospitalized again in 6/17 with a saddle PE. Was placed on Eliquis 5mg  BID.  Admitted 3/22-3/28 for NSTEMI. Peak of troponin 2.26. Cath showed mid LAD 100%. Distal LAD filles by LIMA, distal LAD 80%. D1 95%, D2 100%, ramus 100% and fills vis SVG, prox LCX 95%, OM1 100%. RCA occluded with psterior branches filling via colalterals. LVEDP 27. SVG-LAD occluded, SVG-Diag occluded, SVG-OM1 patent, SVG-RCA occluded. Complex anatomy, overall thought to be poor PCI targets. Goals for medical therapy. She continued to episodic angina. She walked without angina prior to discharge on increased medical therapy. He has intolerance to statins and will need to be referred for PCSK9.     Here today for follow up. He is feeling better since discharge. Had 1 episode of orthopnea that has been resolved.  He has lost 10 pounds since discharge (306-->296lb). Compliant with low sodium diet. Yesterday he uses riding lawnmower forecast getting. Today he required 2 sublingual nitroglycerin for chest pain and shortness of breath on separate occasion. With complete resolution of symptoms. This was occurring with mild exertion. He is completely symptoms free during office visit.   Past Medical History:  Diagnosis Date  . Acute diastolic HF (heart failure) (HCC) 09/18/2016  . Anemia   . Anxiety   . Arthritis    "back, neck" (07/11/2014)  . CAD (coronary artery disease) with CABG 09/2015  01/15/2011  . Chronic lower back pain   . Complication of anesthesia    "they have a hard time waking him up"  . Coronary artery disease    a. 2001: s/p CABG X4;  b. 08/2010 s/p PCI/BMS to VG->RI->OM;  c. 06/2014 Cath/PCI: LM nl, LAD 173m, LCX 57m, RI 100, OM1 100, OM2 80, small, RCA 100ost, VG->RCA 100, VG->Diag 100, VG->RI->OM1 70-80 ISR (4.0x24 Promus DES), LIMA->LAD nl.  . Depression   . Dyslipidemia   . Dysrhythmia   . Essential hypertension   . GERD (gastroesophageal reflux disease)   . History of blood transfusion 2001   "related to OHS"  . Morbid obesity (HCC)   . Nephrolithiasis   . RBBB   . Renal insufficiency    a. noted 06/2014 - baseline unclear as no data since 2012.  . Thrombocytopenia (HCC)    a. Borderline low platelets by labs noted in 06/2014  but also noted on prior labs as well.  . Type II diabetes mellitus (HCC)     Past Surgical History:  Procedure Laterality Date  . APPENDECTOMY  ~ 1955  . BACK SURGERY    . CARDIAC CATHETERIZATION  2001; ~ 2010  . CARDIAC CATHETERIZATION N/A 10/16/2015   Procedure: Left Heart Cath and Cors/Grafts Angiography;  Surgeon: Corky Crafts, MD;  Location: [facility] INVASIVE CV LAB;  Service: Cardiovascular;  Laterality: N/A;  . CARDIOVASCULAR STRESS TEST  07/03/2009   EF 64%  . CHOLECYSTECTOMY    . CORONARY ANGIOPLASTY WITH STENT PLACEMENT  March 2012   BMS to  SVG to intermediate/OM  . CORONARY ANGIOPLASTY WITH STENT PLACEMENT  07/11/2014   "1"  . CORONARY ARTERY BYPASS GRAFT  2001   "CABG X 5"  . CORONARY ARTERY BYPASS GRAFT N/A 10/20/2015   Procedure: REDO CORONARY ARTERY BYPASS GRAFTING (CABG), TIMES THREE, USING LEFT GREATER SAPHENOUS VEIN HARVESTED ENDOSCOPICALLY AND CRYO VEIN, WITH CORONARY ENDARTERECTOMY;  Surgeon: Kerin Perna, MD;  Location: [facility] OR;  Service: Open Heart Surgery;  Laterality: N/A;  SVG to LAD, SVG to OM, CRYOVEIN to RCA with Endarterectomy  . ENDARTERECTOMY Right 10/20/2015   Procedure: ENDARTERECTOMY CAROTID;  Surgeon: Pryor Ochoa, MD;  Location: [facility] OR;  Service: Vascular;  Laterality: Right;  . LEFT HEART CATH AND CORS/GRAFTS ANGIOGRAPHY N/A 09/13/2016   Procedure: Left Heart Cath and Cors/Grafts Angiography;  Surgeon: Marykay Lex, MD;  Location: [facility] INVASIVE CV LAB;  Service: Cardiovascular;  Laterality: N/A;  . LEFT HEART CATHETERIZATION WITH CORONARY ANGIOGRAM N/A 07/11/2014   Procedure: LEFT HEART CATHETERIZATION WITH CORONARY ANGIOGRAM;  Surgeon: Peter M Swaziland, MD;  Location: [facility] CATH LAB;  Service: Cardiovascular;  Laterality: N/A;  . LEFT HEART CATHETERIZATION WITH CORONARY/GRAFT ANGIOGRAM N/A 05/09/2011   Procedure: LEFT HEART CATHETERIZATION WITH Isabel Caprice;  Surgeon: Kathleene Hazel, MD;  Location: [facility] CATH LAB;  Service: Cardiovascular;  Laterality: N/A;  . LUMBAR LAMINECTOMY  01/2011  . SHOULDER ARTHROSCOPY W/ ROTATOR CUFF REPAIR Left 1990's  . TEE WITHOUT CARDIOVERSION N/A 10/20/2015   Procedure: TRANSESOPHAGEAL ECHOCARDIOGRAM (TEE);  Surgeon: Kerin Perna, MD;  Location: [facility] OR;  Service: Open Heart Surgery;  Laterality: N/A;  . TONSILLECTOMY AND ADENOIDECTOMY  1951    Current Medications: Prior to Admission medications   Medication Sig Start Date End Date Taking? Authorizing Provider  acetaminophen (TYLENOL) 325 MG tablet Take 2 tablets (650 mg total) by mouth every 4 (four) hours as  needed for headache or mild pain. 09/18/16   Leone Brand, NP  amLODipine (NORVASC) 2.5 MG tablet Take 1 tablet (2.5 mg total) by mouth daily. 09/18/16   Leone Brand, NP  apixaban (ELIQUIS) 5 MG TABS tablet Take 1 tablet (5 mg total) by mouth 2 (two) times daily. 01/10/16   Vesta Mixer, MD  Artificial Tear Ointment (DRY EYES OP) Place 1 drop into both eyes daily as needed (for dry eyes).    Historical Provider, MD  busPIRone (BUSPAR) 15 MG tablet Take 15 mg by mouth daily. 08/22/15   Historical Provider, MD  carvedilol (COREG) 12.5 MG tablet Take 1 tablet (12.5 mg total) by mouth 2 (two) times daily with a meal. 09/18/16   Leone Brand, NP  clopidogrel (PLAVIX) 75 MG tablet Take 1 tablet (75 mg total) by mouth daily. 09/18/16   Leone Brand, NP  CYMBALTA 60 MG capsule Take 60 mg by mouth at bedtime.  03/30/12   Historical  Provider, MD  ezetimibe (ZETIA) 10 MG tablet Take 1 tablet (10 mg total) by mouth daily. 01/10/16   Vesta Mixer, MD  furosemide (LASIX) 40 MG tablet Take 1 tablet (40 mg total) by mouth daily. 01/10/16   Vesta Mixer, MD  HYDROcodone-acetaminophen (NORCO) 10-325 MG tablet Take 1 tablet by mouth every 6 (six) hours as needed for moderate pain. Patient taking differently: Take 0.5-2 tablets by mouth every 6 (six) hours as needed for moderate pain.  11/13/15   Erin R Barrett, PA-C  insulin glargine (LANTUS) 100 UNIT/ML injection Inject 64 Units into the skin at bedtime.     Historical Provider, MD  isosorbide mononitrate (IMDUR) 60 MG 24 hr tablet Take 60 mg by mouth daily.  10/11/15   Historical Provider, MD  LINZESS 145 MCG CAPS capsule Take 145 mcg by mouth daily as needed (constipation).  09/28/15   Historical Provider, MD  mirtazapine (REMERON) 15 MG tablet Take 15 mg by mouth at bedtime.  03/30/12   Historical Provider, MD  nitroGLYCERIN (NITROSTAT) 0.4 MG SL tablet Place 1 tablet (0.4 mg total) under the tongue every 5 (five) minutes as needed for chest pain. 09/18/16    Leone Brand, NP  NOVOLOG FLEXPEN 100 UNIT/ML FlexPen Inject 24 Units into the skin 2 (two) times daily.  07/01/13   Historical Provider, MD  pantoprazole (PROTONIX) 40 MG tablet Take 1 tablet (40 mg total) by mouth daily. Patient taking differently: Take 40 mg by mouth daily as needed (heartburn).  11/14/15   Erin R Barrett, PA-C  potassium chloride SA (K-DUR,KLOR-CON) 20 MEQ tablet Take 1 tablet (20 mEq total) by mouth daily. 01/10/16   Vesta Mixer, MD  QUEtiapine (SEROQUEL) 400 MG tablet Take 200 mg by mouth at bedtime.     Historical Provider, MD  ranolazine (RANEXA) 1000 MG SR tablet Take 1 tablet (1,000 mg total) by mouth 2 (two) times daily. 09/18/16   Leone Brand, NP    Allergies:   Metformin and related; Pravachol; Ramipril; and Wellbutrin [bupropion hcl]   Social History   Social History  . Marital status: Divorced    Spouse name: N/A  . Number of children: N/A  . Years of education: N/A   Social History Main Topics  . Smoking status: Former Smoker    Packs/day: 4.00    Years: 25.00    Types: Cigarettes  . Smokeless tobacco: Former Neurosurgeon    Types: Chew     Comment: "quit smoking ~ 1985; chewed off and on for a few years; quit chewing in ~ 1985 too"  . Alcohol use Yes     Comment: 07/11/2014 "might take a drink 5X/yr"  . Drug use: No  . Sexual activity: No   Other Topics Concern  . None   Social History Narrative  . None     Family History:  The patient's family history includes Heart attack in his father; Heart disease in his brother, father, and mother.  ROS:   Please see the history of present illness.    ROS All other systems reviewed and are negative.   PHYSICAL EXAM:   VS:  BP 140/72 (BP Location: Right Arm, Patient Position: Sitting, Cuff Size: Large)   Pulse 95   Ht  (1.905 m)   Wt 296 lb 3.2 oz (134.4 kg)   SpO2 93%   BMI 37.02 kg/m    GEN: Well nourished, well developed, in no acute distress  HEENT: normal  Neck:  no JVD, carotid  bruits, or masses Cardiac: RRR; no murmurs, rubs, or gallops,no edema  Respiratory:  clear to auscultation bilaterally, normal work of breathing GI: soft, nontender, distended, + BS MS: no deformity or atrophy  Skin: warm and dry, no rash Neuro:  Alert and Oriented x 3, Strength and sensation are intact Psych: euthymic mood, full affect  Wt Readings from Last 3 Encounters:  09/26/16 296 lb 3.2 oz (134.4 kg)  09/16/16 (!) 306 lb 14.4 oz (139.2 kg)  03/29/16 287 lb 3.2 oz (130.3 kg)      Studies/Labs Reviewed:   EKG:  EKG is not ordered today.   Recent Labs: 11/04/2015: TSH 2.645 11/05/2015: Magnesium 2.2 11/07/2015: ALT 65 09/12/2016: B Natriuretic Peptide 124.3 09/18/2016: BUN 17; Creatinine, Ser 1.61; Hemoglobin 13.1; Platelets 135; Potassium 3.4; Sodium 139   Lipid Panel    Component Value Date/Time   CHOL 129 09/13/2016 0345   TRIG 299 (H) 09/13/2016 0345   HDL 26 (L) 09/13/2016 0345   CHOLHDL 5.0 09/13/2016 0345   VLDL 60 (H) 09/13/2016 0345   LDLCALC 43 09/13/2016 0345    Additional studies/ records that were reviewed today include:   Cardiac cath 09/13/16 by Dr. Herbie Baltimore  Conclusion     Prox LAD ~70 %stenosed. Mid LAD lesion, 100 %stenosed.  The distal LAD is filled via the LIMA graft. The distal LAD is diffusely diseased. Dist LAD-1 lesion, 80 %stenosed. Dist LAD-2 lesion, 80 %stenosed.  The New SVG-dLAD from the hood of the new SVG-OM2 is 100% occluded at the hood.  Ost 1st Diag lesion, 95 %stenosed. 2nd Diag lesion, 100 %stenosed.- the prior SCG-Diag is known to be occluded.  Ramus lesion, 100 %stenosed. Fills via old SVG with Ostial Stent.  Old SVG-Ramus Intermedius: Origin to Mid Graft DES, 45 %stenosed.  Prox Cx to Dist Cx lesion, 95 %stenosed. ~At the takeoff of OM2  Ost 1st Mrg to 1st Mrg lesion, 100 %stenosed.  New SVG-OM1 and is normal in caliber and anatomically normal. Fills a small caliber diffusely diseased OM branch with backfilling to the  native diseased AV Groove Cx.  Ost RCA to Dist RCA lesion, 100 %stenosed. -The posterior lateral segment branches and RPDA are filled via left to right collaterals from the AV groove circumflex it is diffusely diseased.  New SVG-RCA: Prox Graft lesion, 100 %stenosed.  The left ventricular systolic function is normal. The left ventricular ejection fraction is 50-55% by visual estimate.  LV end diastolic pressure is moderately elevated.  The patient has severe diffuse native coronary disease with very poor PCI targets. He has now 2 out of the 3 new vein grafts that are occluded including the new vein graft to RCA and new vein graft to distal LAD. In addition to the new vein graft disease, he has progression of disease in the native LAD-diagonal system as well as the AV groove circumflex system. The AV groove system arise collaterals to the distal RCA.   Unfortunately, there are not any immediately favorable PCI targets.  Recommendation would be to optimize medical management.  Plan:  Return to nursing unit for TR band removal.  Would restart IV heparin given positive troponins.   Defer to primary service for titration of cardiac medications.  I will write for a one-time dose of IV Lasix tonight given elevated LVEDP. He may require additional diuresis.    Diagnostic Diagram      Echo: 6/17  Study Conclusions  - Left ventricle: The cavity size was normal.  Wall thickness was increased in a pattern of moderate LVH. Systolic function was normal. The estimated ejection fraction was in the range of 55% to 60%. Wall motion was normal; there were no regional wall motion abnormalities. Features are consistent with a pseudonormal left ventricular filling pattern, with concomitant abnormal relaxation and increased filling pressure (grade 2 diastolic dysfunction). - Aortic valve: Trileaflet; moderately thickened, moderately calcified leaflets. - Aorta: Aortic  root dimension: 38 mm (ED). - Ascending aorta: The ascending aorta was mildly dilated. - Right ventricle: The cavity size was mildly dilated. Wall thickness was normal. Systolic function was mildly reduced. - Right atrium: The atrium was mildly dilated. - Tricuspid valve: There was moderate regurgitation. - Pulmonary arteries: Systolic pressure was moderately increased. PA peak pressure: 52 mm Hg (S).    ASSESSMENT & PLAN:    1. CAD s/p redo CABG 4/17 - Recent admission for NSTEMI. Cath with complex anatomy. Plan for medical therapy. 2 episode of chest pain and shortness of breath with minimal exertion today and resolved with sublingual nitroglycerin. He is symptoms free during my evaluation in clinic. Will increase his Imdur to 90 mg once a day. Continue amlodipine 2.5 mg once a day, Coreg 12.5 mg twice a day, Plavix 75 mg once a day, Ranexa 1000 milligrams twice a day. He will let us know if worsening of symptoms or required daily  sublingual nitroglycerin.  2. HLD - 09/13/2016: Cholesterol 129; HDL 26; LDL Cholesterol 43; Triglycerides 299; VLDL 60  - He has intolerance to statins and will referred for PCSK9.     3. Chronic diastolic CHF - He lost 10 pounds since discharge. Intermittent  orthopnea and dyspnea. He is feeling better day by day.  4. Hx of DVT/PE - Continue Eliquis  5. HTN - BP of 140/72 today. Meds changes as above.    Medication Adjustments/Labs and Tests Ordered: Current medicines are reviewed at length with the patient today.  Concerns regarding medicines are outlined above.  Medication changes, Labs and Tests ordered today are listed in the Patient Instructions below. Patient Instructions  Medication Instructions:  Please increase your Isosorbide to 90 mg a day (1 and 1/2 tablet) Continue all other medications as listed.  Labwork: Please have blood work today.  (BNP,BMP and CBC)  Follow-Up: You have been referred to the Lipid Clinic. Follow up in 4  to 6 weeks with Dr Elease Hashimoto.  If you need a refill on your cardiac medications before your next appointment, please call your pharmacy.  Thank you for choosing [facility]!!        Lorelei Pont, [place]  09/26/2016 2:08 PM    [facility] [address] [place], [place], [place]  [address] Phone: [phone]; Fax: [phone]

## 2016-09-26 ENCOUNTER — Ambulatory Visit (INDEPENDENT_AMBULATORY_CARE_PROVIDER_SITE_OTHER): Payer: Medicare Other | Admitting: Pharmacist

## 2016-09-26 ENCOUNTER — Encounter (INDEPENDENT_AMBULATORY_CARE_PROVIDER_SITE_OTHER): Payer: Self-pay

## 2016-09-26 ENCOUNTER — Ambulatory Visit (INDEPENDENT_AMBULATORY_CARE_PROVIDER_SITE_OTHER): Payer: Medicare Other | Admitting: Physician Assistant

## 2016-09-26 ENCOUNTER — Encounter: Payer: Self-pay | Admitting: Physician Assistant

## 2016-09-26 ENCOUNTER — Encounter: Payer: Self-pay | Admitting: Pharmacist

## 2016-09-26 VITALS — BP 140/72 | HR 95 | Ht 75.0 in | Wt 296.2 lb

## 2016-09-26 DIAGNOSIS — R0602 Shortness of breath: Secondary | ICD-10-CM | POA: Diagnosis not present

## 2016-09-26 DIAGNOSIS — I5032 Chronic diastolic (congestive) heart failure: Secondary | ICD-10-CM | POA: Diagnosis not present

## 2016-09-26 DIAGNOSIS — Z79899 Other long term (current) drug therapy: Secondary | ICD-10-CM | POA: Diagnosis not present

## 2016-09-26 DIAGNOSIS — I10 Essential (primary) hypertension: Secondary | ICD-10-CM | POA: Diagnosis not present

## 2016-09-26 DIAGNOSIS — E785 Hyperlipidemia, unspecified: Secondary | ICD-10-CM

## 2016-09-26 DIAGNOSIS — Z951 Presence of aortocoronary bypass graft: Secondary | ICD-10-CM

## 2016-09-26 MED ORDER — PITAVASTATIN CALCIUM 1 MG PO TABS: 1.0000 mg | ORAL_TABLET | Freq: Every day | ORAL | 0 refills | Status: DC

## 2016-09-26 MED ORDER — ISOSORBIDE MONONITRATE ER 60 MG PO TB24: 90.0000 mg | ORAL_TABLET | Freq: Every day | ORAL | 11 refills | Status: DC

## 2016-09-26 NOTE — Patient Instructions (Addendum)
START taking Livalo  (1/2 tablet) once daily   Call [phone] when you get low on medication   Cholesterol Cholesterol is a fat. Your body needs a small amount of cholesterol. Cholesterol (plaque) may build up in your blood vessels (arteries). That makes you more likely to have a heart attack or stroke. You cannot feel your cholesterol level. Having a blood test is the only way to find out if your level is high. Keep your test results. Work with your doctor to keep your cholesterol at a good level. What do the results mean?  Total cholesterol is how much cholesterol is in your blood.  LDL is bad cholesterol. This is the type that can build up. Try to have low LDL.  HDL is good cholesterol. It cleans your blood vessels and carries LDL away. Try to have high HDL.  Triglycerides are fat that the body can store or burn for energy. What are good levels of cholesterol?  Total cholesterol below 200.  LDL below 100 is good for people who have health risks. LDL below 70 is good for people who have very high risks.  HDL above 40 is good. It is best to have HDL of 60 or higher.  Triglycerides below 150. How can I lower my cholesterol? Diet  Follow your diet program as told by your doctor.  Choose fish, white meat chicken, or [place] that is roasted or baked. Try not to eat red meat, fried foods, sausage, or lunch meats.  Eat lots of fresh fruits and vegetables.  Choose whole grains, beans, pasta, potatoes, and cereals.  Choose olive oil, corn oil, or canola oil. Only use small amounts.  Try not to eat butter, mayonnaise, shortening, or palm kernel oils.  Try not to eat foods with trans fats.  Choose low-fat or nonfat dairy foods.  Drink skim or nonfat milk.  Eat low-fat or nonfat yogurt and cheeses.  Try not to drink whole milk or cream.  Try not to eat ice cream, egg yolks, or full-fat cheeses.  Healthy desserts include angel food cake, ginger snaps, animal crackers,  hard candy, popsicles, and low-fat or nonfat frozen yogurt. Try not to eat pastries, cakes, pies, and cookies. Exercise  Follow your exercise program as told by your doctor.  Be more active. Try gardening, walking, and taking the stairs.  Ask your doctor about ways that you can be more active. Medicine  Take over-the-counter and prescription medicines only as told by your doctor. This information is not intended to replace advice given to you by your health care provider. Make sure you discuss any questions you have with your health care provider. Document Released: 09/06/2008 Document Revised: 01/10/2016 Document Reviewed: 12/21/2015 Elsevier Interactive Patient Education  2017 [employer].

## 2016-09-26 NOTE — Progress Notes (Signed)
Patient ID: Tyler Le                 DOB: Nov 01, 1945                    MRN: [id number]     HPI: Tyler Le is a 71 y.o. male patient of Dr. Elease Hashimoto that presents today for lipid evaluation. PMH includes CAD s/p CABG re-do (first CABG in 2001), HTN, unstable angina, PE, NSTEMI, diastolic HF, OSA, DM2, stage 3 CKD, HLD, and recently admitted for NSTEMI in March 2018.   He presents today and states that he is feeling well all things considered. He states he previously has been on statins, but they may have been causing muscle aches and he was told his cholesterol was not high so he discontinued them. He does believe the muscle/bone aches improved after he stopped the medications.    LDL Goal: <70  Current Medications: Zetia  daily Intolerances: Fish oil, simvastatin  daily (2012, 2016), Crestor  daily (2012) - bones ached - resolved once discontinued   Diet: Combination out and from home. Does eat red meat and chicken. Eats everything fried. They have tried to do better with fried food. Has been eating more greens recently.   Exercise: Not right now with just being discharged.   Family History: Brother with heart disease. Father passed from unknown cause in 34s.   Social History: no tobacco products. Extremely rare alcohol.   Labs: 09/13/16:  TC 129,  TG 299,  HDL 26,  LDL 43 - at time of most recent NSTEMI  Past Medical History:  Diagnosis Date  . Acute diastolic HF (heart failure) (HCC) 09/18/2016  . Anemia   . Anxiety   . Arthritis    "back, neck" (07/11/2014)  . CAD (coronary artery disease) with CABG 09/2015  01/15/2011  . Chronic lower back pain   . Complication of anesthesia    "they have a hard time waking him up"  . Coronary artery disease    a. 2001: s/p CABG X4;  b. 08/2010 s/p PCI/BMS to VG->RI->OM;  c. 06/2014 Cath/PCI: LM nl, LAD 123m, LCX 65m, RI 100, OM1 100, OM2 80, small, RCA 100ost, VG->RCA 100, VG->Diag 100, VG->RI->OM1 70-80 ISR (4.0x24 Promus  DES), LIMA->LAD nl.  . Depression   . Dyslipidemia   . Dysrhythmia   . Essential hypertension   . GERD (gastroesophageal reflux disease)   . History of blood transfusion 2001   "related to OHS"  . Morbid obesity (HCC)   . Nephrolithiasis   . RBBB   . Renal insufficiency    a. noted 06/2014 - baseline unclear as no data since 2012.  . Thrombocytopenia (HCC)    a. Borderline low platelets by labs noted in 06/2014 but also noted on prior labs as well.  . Type II diabetes mellitus (HCC)     Current Outpatient Prescriptions on File Prior to Visit  Medication Sig Dispense Refill  . acetaminophen (TYLENOL) 325 MG tablet Take 2 tablets (650 mg total) by mouth every 4 (four) hours as needed for headache or mild pain.    Marland Kitchen amLODipine (NORVASC) 2.5 MG tablet Take 1 tablet (2.5 mg total) by mouth daily. 30 tablet 6  . apixaban (ELIQUIS) 5 MG TABS tablet Take 1 tablet (5 mg total) by mouth 2 (two) times daily. 60 tablet 11  . Artificial Tear Ointment (DRY EYES OP) Place 1 drop into both eyes daily as needed (for dry eyes).    Marland Kitchen  busPIRone (BUSPAR) 15 MG tablet Take 15 mg by mouth daily.    . carvedilol (COREG) 12.5 MG tablet Take 1 tablet (12.5 mg total) by mouth 2 (two) times daily with a meal. 60 tablet 6  . clopidogrel (PLAVIX) 75 MG tablet Take 1 tablet (75 mg total) by mouth daily. 30 tablet 6  . CYMBALTA 60 MG capsule Take 60 mg by mouth at bedtime.     Marland Kitchen ezetimibe (ZETIA) 10 MG tablet Take 1 tablet (10 mg total) by mouth daily. 30 tablet 11  . furosemide (LASIX) 40 MG tablet Take 1 tablet (40 mg total) by mouth daily. 30 tablet 11  . HYDROcodone-acetaminophen (NORCO) 10-325 MG tablet Take 1 tablet by mouth every 6 (six) hours as needed for moderate pain. (Patient taking differently: Take 0.5-2 tablets by mouth every 6 (six) hours as needed for moderate pain. ) 30 tablet 0  . insulin glargine (LANTUS) 100 UNIT/ML injection Inject 64 Units into the skin at bedtime.     . isosorbide mononitrate  (IMDUR) 60 MG 24 hr tablet Take 60 mg by mouth daily.     Marland Kitchen LINZESS 145 MCG CAPS capsule Take 145 mcg by mouth daily as needed (constipation).     . mirtazapine (REMERON) 15 MG tablet Take 15 mg by mouth at bedtime.     . nitroGLYCERIN (NITROSTAT) 0.4 MG SL tablet Place 1 tablet (0.4 mg total) under the tongue every 5 (five) minutes as needed for chest pain. 25 tablet 4  . NOVOLOG FLEXPEN 100 UNIT/ML FlexPen Inject 24 Units into the skin 2 (two) times daily.     . pantoprazole (PROTONIX) 40 MG tablet Take 1 tablet (40 mg total) by mouth daily. (Patient taking differently: Take 40 mg by mouth daily as needed (heartburn). )    . potassium chloride SA (K-DUR,KLOR-CON) 20 MEQ tablet Take 1 tablet (20 mEq total) by mouth daily. 30 tablet 11  . QUEtiapine (SEROQUEL) 400 MG tablet Take 200 mg by mouth at bedtime.     . ranolazine (RANEXA) 1000 MG SR tablet Take 1 tablet (1,000 mg total) by mouth 2 (two) times daily. 60 tablet 6   No current facility-administered medications on file prior to visit.     Allergies  Allergen Reactions  . Metformin And Related Nausea And Vomiting  . Pravachol Nausea And Vomiting  . Ramipril Nausea Only  . Wellbutrin [Bupropion Hcl] Nausea And Vomiting    Assessment/Plan: Hyperlipidemia: Despite LDL being below goal, it is likely that this is a false low and patient would still benefit from statin therapy. His LDL was likely low given elevated TG and recent MI. Generally LDL remains falsely low for about 6-8 weeks post-MI. Will start Livalo  daily - samples given. Pt instructed to call when supply gets low.   Thank you,  Freddie Apley. Cleatis Polka, PharmD  [facility]  09/26/2016 7:52 AM

## 2016-09-26 NOTE — Patient Instructions (Signed)
Medication Instructions:  Please increase your Isosorbide to 90 mg a day (1 and 1/2 tablet) Continue all other medications as listed.  Labwork: Please have blood work today.  (BNP,BMP and CBC)  Follow-Up: You have been referred to the Lipid Clinic. Follow up in 4 to 6 weeks with Dr Elease Hashimoto.  If you need a refill on your cardiac medications before your next appointment, please call your pharmacy.  Thank you for choosing [facility]!!

## 2016-09-27 ENCOUNTER — Telehealth: Payer: Self-pay | Admitting: *Deleted

## 2016-09-27 DIAGNOSIS — Z79899 Other long term (current) drug therapy: Secondary | ICD-10-CM

## 2016-09-27 LAB — CBC
Hematocrit: 37.9 % (ref 37.5–51.0)
Hemoglobin: 12.2 g/dL — ABNORMAL LOW (ref 13.0–17.7)
MCH: 30 pg (ref 26.6–33.0)
MCHC: 32.2 g/dL (ref 31.5–35.7)
MCV: 93 fL (ref 79–97)
PLATELETS: 194 10*3/uL (ref 150–379)
RBC: 4.06 x10E6/uL — ABNORMAL LOW (ref 4.14–5.80)
RDW: 14.7 % (ref 12.3–15.4)
WBC: 5.4 10*3/uL (ref 3.4–10.8)

## 2016-09-27 LAB — BASIC METABOLIC PANEL
BUN / CREAT RATIO: 17 (ref 10–24)
BUN: 25 mg/dL (ref 8–27)
CO2: 27 mmol/L (ref 18–29)
CREATININE: 1.44 mg/dL — AB (ref 0.76–1.27)
Calcium: 9.3 mg/dL (ref 8.6–10.2)
Chloride: 95 mmol/L — ABNORMAL LOW (ref 96–106)
GFR calc Af Amer: 56 mL/min/{1.73_m2} — ABNORMAL LOW (ref >59)
GFR, EST NON AFRICAN AMERICAN: 48 mL/min/{1.73_m2} — AB (ref >59)
Glucose: 233 mg/dL — ABNORMAL HIGH (ref 65–99)
Potassium: 4.6 mmol/L (ref 3.5–5.2)
SODIUM: 138 mmol/L (ref 134–144)

## 2016-09-27 LAB — PRO B NATRIURETIC PEPTIDE: NT-Pro BNP: 465 pg/mL — ABNORMAL HIGH (ref 0–376)

## 2016-09-27 MED ORDER — POTASSIUM CHLORIDE CRYS ER 20 MEQ PO TBCR: EXTENDED_RELEASE_TABLET | ORAL | 11 refills | Status: DC

## 2016-09-27 MED ORDER — FUROSEMIDE 40 MG PO TABS: ORAL_TABLET | ORAL | 11 refills | Status: DC

## 2016-09-27 NOTE — Telephone Encounter (Signed)
-----   Message from [place], [place] sent at 09/27/2016  8:52 AM EDT ----- Scr improved. Fluid marker is slightly up. Hgb stable.   Increase lasix to  BID with Kdur BID for 3 days then back to normal daily dose of both. BEMT in 1 week.

## 2016-10-04 ENCOUNTER — Other Ambulatory Visit: Payer: Medicare Other | Admitting: *Deleted

## 2016-10-04 DIAGNOSIS — Z79899 Other long term (current) drug therapy: Secondary | ICD-10-CM

## 2016-10-04 LAB — BASIC METABOLIC PANEL
BUN/Creatinine Ratio: 12 (ref 10–24)
BUN: 20 mg/dL (ref 8–27)
CALCIUM: 8.8 mg/dL (ref 8.6–10.2)
CO2: 27 mmol/L (ref 18–29)
CREATININE: 1.66 mg/dL — AB (ref 0.76–1.27)
Chloride: 96 mmol/L (ref 96–106)
GFR calc Af Amer: 47 mL/min/{1.73_m2} — ABNORMAL LOW (ref >59)
GFR calc non Af Amer: 41 mL/min/{1.73_m2} — ABNORMAL LOW (ref >59)
GLUCOSE: 221 mg/dL — AB (ref 65–99)
Potassium: 4 mmol/L (ref 3.5–5.2)
SODIUM: 143 mmol/L (ref 134–144)

## 2016-10-08 ENCOUNTER — Telehealth: Payer: Self-pay | Admitting: *Deleted

## 2016-10-08 DIAGNOSIS — Z79899 Other long term (current) drug therapy: Secondary | ICD-10-CM

## 2016-10-08 NOTE — Telephone Encounter (Signed)
-----   Message from [place], [place] sent at 10/07/2016  9:30 AM EDT ----- Normal electrolytes. Scr elevated with higher dose of lasix for few day. Now back to daily dose of lasix. Will repeat lab (BMET) next week when he comes to lipid clinic visit.

## 2016-10-09 ENCOUNTER — Telehealth: Payer: Self-pay | Admitting: Cardiovascular Disease

## 2016-10-09 NOTE — Telephone Encounter (Signed)
Close encounter 

## 2016-10-14 ENCOUNTER — Ambulatory Visit: Payer: Medicare Other

## 2016-10-14 ENCOUNTER — Other Ambulatory Visit: Payer: Medicare Other

## 2016-10-14 ENCOUNTER — Telehealth: Payer: Self-pay | Admitting: Pharmacist

## 2016-10-14 DIAGNOSIS — E785 Hyperlipidemia, unspecified: Secondary | ICD-10-CM

## 2016-10-14 NOTE — Progress Notes (Deleted)
Patient ID: Tyler Le                 DOB: 01-Aug-1945                    MRN: [id number]     HPI: Tyler Le is a 71 y.o. male patient of *** with PMH below that presents today for lipid evaluation.  Risk Factors:  LDL Goal:   Current Medications: Pitavastatin  daily, zetia  daily  Intolerances:   Diet:   Exercise:   Family History:   Social History:   Labs:  Past Medical History:  Diagnosis Date  . Acute diastolic HF (heart failure) (HCC) 09/18/2016  . Anemia   . Anxiety   . Arthritis    "back, neck" (07/11/2014)  . CAD (coronary artery disease) with CABG 09/2015  01/15/2011  . Chronic lower back pain   . Complication of anesthesia    "they have a hard time waking him up"  . Coronary artery disease    a. 2001: s/p CABG X4;  b. 08/2010 s/p PCI/BMS to VG->RI->OM;  c. 06/2014 Cath/PCI: LM nl, LAD 182m, LCX 57m, RI 100, OM1 100, OM2 80, small, RCA 100ost, VG->RCA 100, VG->Diag 100, VG->RI->OM1 70-80 ISR (4.0x24 Promus DES), LIMA->LAD nl.  . Depression   . Dyslipidemia   . Dysrhythmia   . Essential hypertension   . GERD (gastroesophageal reflux disease)   . History of blood transfusion 2001   "related to OHS"  . Morbid obesity (HCC)   . Nephrolithiasis   . RBBB   . Renal insufficiency    a. noted 06/2014 - baseline unclear as no data since 2012.  . Thrombocytopenia (HCC)    a. Borderline low platelets by labs noted in 06/2014 but also noted on prior labs as well.  . Type II diabetes mellitus (HCC)     Current Outpatient Prescriptions on File Prior to Visit  Medication Sig Dispense Refill  . acetaminophen (TYLENOL) 325 MG tablet Take 2 tablets (650 mg total) by mouth every 4 (four) hours as needed for headache or mild pain.    Marland Kitchen amLODipine (NORVASC) 2.5 MG tablet Take 1 tablet (2.5 mg total) by mouth daily. 30 tablet 6  . apixaban (ELIQUIS) 5 MG TABS tablet Take 1 tablet (5 mg total) by mouth 2 (two) times daily. 60 tablet 11  . Artificial Tear Ointment  (DRY EYES OP) Place 1 drop into both eyes daily as needed (for dry eyes).    . busPIRone (BUSPAR) 15 MG tablet Take 15 mg by mouth daily.    . carvedilol (COREG) 12.5 MG tablet Take 1 tablet (12.5 mg total) by mouth 2 (two) times daily with a meal. 60 tablet 6  . clopidogrel (PLAVIX) 75 MG tablet Take 1 tablet (75 mg total) by mouth daily. 30 tablet 6  . CYMBALTA 60 MG capsule Take 60 mg by mouth at bedtime.     Marland Kitchen ezetimibe (ZETIA) 10 MG tablet Take 1 tablet (10 mg total) by mouth daily. 30 tablet 11  . furosemide (LASIX) 40 MG tablet Take 1 tablet by mouth twice a day for 3 days then go back down to 1 tablet by mouth daily. 30 tablet 11  . HYDROcodone-acetaminophen (NORCO) 10-325 MG tablet Take 1 tablet by mouth every 6 (six) hours as needed for moderate pain. (Patient taking differently: Take 0.5-2 tablets by mouth every 6 (six) hours as needed for moderate pain. ) 30 tablet 0  .  insulin glargine (LANTUS) 100 UNIT/ML injection Inject 64 Units into the skin at bedtime.     . isosorbide mononitrate (IMDUR) 60 MG 24 hr tablet Take 1.5 tablets (90 mg total) by mouth daily. 45 tablet 11  . LINZESS 145 MCG CAPS capsule Take 145 mcg by mouth daily as needed (constipation).     . mirtazapine (REMERON) 15 MG tablet Take 15 mg by mouth at bedtime.     . nitroGLYCERIN (NITROSTAT) 0.4 MG SL tablet Place 1 tablet (0.4 mg total) under the tongue every 5 (five) minutes as needed for chest pain. 25 tablet 4  . NOVOLOG FLEXPEN 100 UNIT/ML FlexPen Inject 24 Units into the skin 2 (two) times daily.     . pantoprazole (PROTONIX) 40 MG tablet Take 1 tablet (40 mg total) by mouth daily. (Patient taking differently: Take 40 mg by mouth daily as needed (heartburn). )    . Pitavastatin Calcium (LIVALO) 1 MG TABS Take 1 tablet (1 mg total) by mouth daily. 30 tablet 0  . potassium chloride SA (K-DUR,KLOR-CON) 20 MEQ tablet Take 1 tablet by mouth twice a day for 3 days then go back down to 1 tablet by mouth daily 30 tablet 11   . QUEtiapine (SEROQUEL) 400 MG tablet Take 200 mg by mouth at bedtime.     . ranolazine (RANEXA) 1000 MG SR tablet Take 1 tablet (1,000 mg total) by mouth 2 (two) times daily. 60 tablet 6   No current facility-administered medications on file prior to visit.     Allergies  Allergen Reactions  . Metformin And Related Nausea And Vomiting  . Pravachol Nausea And Vomiting  . Ramipril Nausea Only  . Wellbutrin [Bupropion Hcl] Nausea And Vomiting    Assessment/Plan: Hyperlipidemia:   Thank you,  Freddie Apley. Cleatis Polka, PharmD  [facility]  10/14/2016 7:34 AM

## 2016-10-14 NOTE — Telephone Encounter (Signed)
Pt tolerating Livalo  daily well. His supply is getting low will leave samples for him at the front desk.

## 2016-10-15 ENCOUNTER — Encounter (INDEPENDENT_AMBULATORY_CARE_PROVIDER_SITE_OTHER): Payer: Self-pay

## 2016-10-15 ENCOUNTER — Other Ambulatory Visit: Payer: Medicare Other | Admitting: *Deleted

## 2016-10-15 DIAGNOSIS — Z79899 Other long term (current) drug therapy: Secondary | ICD-10-CM

## 2016-10-15 LAB — BASIC METABOLIC PANEL
BUN/Creatinine Ratio: 11 (ref 10–24)
BUN: 16 mg/dL (ref 8–27)
CALCIUM: 8.6 mg/dL (ref 8.6–10.2)
CHLORIDE: 98 mmol/L (ref 96–106)
CO2: 24 mmol/L (ref 18–29)
Creatinine, Ser: 1.44 mg/dL — ABNORMAL HIGH (ref 0.76–1.27)
GFR calc Af Amer: 56 mL/min/{1.73_m2} — ABNORMAL LOW (ref >59)
GFR, EST NON AFRICAN AMERICAN: 48 mL/min/{1.73_m2} — AB (ref >59)
Glucose: 191 mg/dL — ABNORMAL HIGH (ref 65–99)
POTASSIUM: 4.5 mmol/L (ref 3.5–5.2)
SODIUM: 140 mmol/L (ref 134–144)

## 2016-11-11 ENCOUNTER — Ambulatory Visit (INDEPENDENT_AMBULATORY_CARE_PROVIDER_SITE_OTHER): Payer: Medicare Other | Admitting: Cardiovascular Disease

## 2016-11-11 ENCOUNTER — Encounter: Payer: Self-pay | Admitting: Cardiovascular Disease

## 2016-11-11 VITALS — BP 134/78 | HR 92 | Ht 75.0 in | Wt 298.1 lb

## 2016-11-11 DIAGNOSIS — I2510 Atherosclerotic heart disease of native coronary artery without angina pectoris: Secondary | ICD-10-CM | POA: Diagnosis not present

## 2016-11-11 DIAGNOSIS — I2692 Saddle embolus of pulmonary artery without acute cor pulmonale: Secondary | ICD-10-CM

## 2016-11-11 DIAGNOSIS — I5031 Acute diastolic (congestive) heart failure: Secondary | ICD-10-CM | POA: Diagnosis not present

## 2016-11-11 MED ORDER — CARVEDILOL 25 MG PO TABS: 25.0000 mg | ORAL_TABLET | Freq: Two times a day (BID) | ORAL | 3 refills | Status: DC

## 2016-11-11 NOTE — Patient Instructions (Signed)
Medication Instructions:  INCREASE Carvedilol (Coreg) to 25 mg twice daily   Labwork: None Ordered   Testing/Procedures: None Ordered   Follow-Up: Your physician wants you to follow-up in: 6 months with Dr. Elease HashimotoNahser.  You will receive a reminder letter in the mail two months in advance. If you don't receive a letter, please call our office to schedule the follow-up appointment.   If you need a refill on your cardiac medications before your next appointment, please call your pharmacy.   Thank you for choosing [facility]! Eligha BridegroomMichelle Kimela Malstrom, RN [phone]

## 2016-11-11 NOTE — Progress Notes (Signed)
Cardiology Office Note   Date:  11/11/2016   ID:  Tyler Le, DOB Nov 06, 1945, MRN [id number]  PCP:  Kaleen Mask, MD  Cardiologist: Cassell Clement MD, now Aylyn Wenzler  Chief Complaint  Patient presents with  . Coronary Artery Disease  . Hyperlipidemia   Problem list 1. Coronary artery disease-status post coronary artery bypass grafting x 2 ( 2001, 2017)   2. Hypertension 3. Diabetes mellitus 4.  Dyslipidemia 5. Pulmonary embolus     Tyler Le is a 71 y.o. male who presents for  Scheduled six-month follow-up visit  Tyler Le is a 71 y.o. male who presents for follow-up office visit He has a history of known ischemic heart disease. He underwent coronary artery bypass graft surgery in 2001. In March 2012 he underwent bare-metal stent placement to the saphenous vein graft to the intermediate and obtuse marginal.  The patient was seen in December 2015 complaining of more frequent chest pain The patient had a Lexiscan Myoview stress test on 06/29/14 which showed Intermediate risk stress nuclear study demonstrating a mostly reversible defect along the mid to distal anterior wall/apical wall distribution. 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 Cardiac catheterization was recommended. On 07/11/14 patient underwent cardiac catheterization by Dr. Peter Swaziland with the following findings: 1. Severe 3 vessel obstructive CAD 2. Patent LIMA to the LAD. 3. Restenosis in the stent in SVG to ramus/OM 4. Occluded SVG to diagonal 5. Occluded SVG to RCA 6. Successful stenting of the SVG to ramus/OM ostium with a DES.   Recommendations:  Continue DAPT indefinitely if possible. Needs to take at least one year.  Since last visit the patient has been doing reasonably well. His blood pressure has been running low yesterday and today. He has been feeling more fatigued. He has not had any syncope. He has occasional chest pain and takes  sublingual nitroglycerin with improvement. He continues to have problems with anxiety attacks, particularly at night when he lies down to go to sleep.   the patient comes in today for a scheduled follow-up visit. He states that he is not taking aspirin. And he is only taking the Brilinta once a day. He has been having some shortness of breath. He fell last week and injured his left shoulder.  Since then he is also been having some left shoulder discomfort. He has been taking occasional sublingual nitroglycerin for chest pain. He has not been careful with his diet and his weight is up. He has been eating salty food. He has edema.  October 11, 2015:  Here to discuss stopping the Brilinta in order to get a thyroid bx.   Has been having exertional intrascapular pain with any exertion .  Also has severe DOE. NTG relieves the CP  Tries to eat a low salt diet.  Went to the ER on 1 occasion.   Work up in the ER was negative and he was sent home.  Has severe exertional CP - across the chest  And into the intra scapular region . Relieved to SL NGT.  January 10, 2016:  Tyler Le was hospitalized in May for angina . Had redo CABG with carotid surgery  Complicated by respiratory failure . ultimagely needed a trach  Tyler Le was hospitalized with a saddle pulmonary embolus  A month later  (June, 2017):  Still short of breath.  [facility]  Was at the [place] 3-4 weeks ago  with hypotension  - coreg was stopped at that point   Oct. 6 2017:  Tyler Le is seen back today for follow up .  Hx of CAD, pulmonary embolus.  Back to normal Not much exercise,   Walks around the driveway on occasion   Nov 11, 2016: Tyler Le seen today for follow-up of his coronary artery disease and history of pulmonary embolus.  Was hospitalized in March with NSTEMI.  Card catheterization    Prox LAD ~70 %stenosed. Mid LAD lesion, 100 %stenosed.  The distal LAD is filled via the LIMA graft. The  distal LAD is diffusely diseased. Dist LAD-1 lesion, 80 %stenosed. Dist LAD-2 lesion, 80 %stenosed.  The New SVG-dLAD from the hood of the new SVG-OM2 is 100% occluded at the hood.  Ost 1st Diag lesion, 95 %stenosed. 2nd Diag lesion, 100 %stenosed.- the prior SCG-Diag is known to be occluded.  Ramus lesion, 100 %stenosed. Fills via old SVG with Ostial Stent.  Old SVG-Ramus Intermedius: Origin to Mid Graft DES, 45 %stenosed.  Prox Cx to Dist Cx lesion, 95 %stenosed. ~At the takeoff of OM2  Ost 1st Mrg to 1st Mrg lesion, 100 %stenosed.  New SVG-OM1 and is normal in caliber and anatomically normal. Fills a small caliber diffusely diseased OM branch with backfilling to the native diseased AV Groove Cx.  Ost RCA to Dist RCA lesion, 100 %stenosed. -The posterior lateral segment branches and RPDA are filled via left to right collaterals from the AV groove circumflex it is diffusely diseased.  New SVG-RCA: Prox Graft lesion, 100 %stenosed.  The left ventricular systolic function is normal. The left ventricular ejection fraction is 50-55% by visual estimate.  LV end diastolic pressure is moderately elevated.  Medical therapy was recommended. There were no good targets for percutaneous intervention.  Not able to exercise much,   Hip pain , shortness of breath  Occasion CP with exertion   Need to have his teeth pulled ( 9 teeth )  May need to hold plavix for 5 days    Past Medical History:  Diagnosis Date  . Acute diastolic HF (heart failure) (HCC) 09/18/2016  . Anemia   . Anxiety   . Arthritis    "back, neck" (07/11/2014)  . CAD (coronary artery disease) with CABG 09/2015  01/15/2011  . Chronic lower back pain   . Complication of anesthesia    "they have a hard time waking him up"  . Coronary artery disease    a. 2001: s/p CABG X4;  b. 08/2010 s/p PCI/BMS to VG->RI->OM;  c. 06/2014 Cath/PCI: LM nl, LAD 1104m, LCX 61m, RI 100, OM1 100, OM2 80, small, RCA 100ost, VG->RCA 100, VG->Diag 100,  VG->RI->OM1 70-80 ISR (4.0x24 Promus DES), LIMA->LAD nl.  . Depression   . Dyslipidemia   . Dysrhythmia   . Essential hypertension   . GERD (gastroesophageal reflux disease)   . History of blood transfusion 2001   "related to OHS"  . Morbid obesity (HCC)   . Nephrolithiasis   . RBBB   . Renal insufficiency    a. noted 06/2014 - baseline unclear as no data since 2012.  . Thrombocytopenia (HCC)    a. Borderline low platelets by labs noted in 06/2014 but also noted on prior labs as well.  . Type II diabetes mellitus (HCC)     Past Surgical History:  Procedure Laterality Date  . APPENDECTOMY  ~ 1955  . BACK SURGERY    . CARDIAC CATHETERIZATION  2001; ~ 2010  . CARDIAC  CATHETERIZATION N/A 10/16/2015   Procedure: Left Heart Cath and Cors/Grafts Angiography;  Surgeon: Corky Crafts, MD;  Location: [facility] INVASIVE CV LAB;  Service: Cardiovascular;  Laterality: N/A;  . CARDIOVASCULAR STRESS TEST  07/03/2009   EF 64%  . CHOLECYSTECTOMY    . CORONARY ANGIOPLASTY WITH STENT PLACEMENT  March 2012   BMS to SVG to intermediate/OM  . CORONARY ANGIOPLASTY WITH STENT PLACEMENT  07/11/2014   "1"  . CORONARY ARTERY BYPASS GRAFT  2001   "CABG X 5"  . CORONARY ARTERY BYPASS GRAFT N/A 10/20/2015   Procedure: REDO CORONARY ARTERY BYPASS GRAFTING (CABG), TIMES THREE, USING LEFT GREATER SAPHENOUS VEIN HARVESTED ENDOSCOPICALLY AND CRYO VEIN, WITH CORONARY ENDARTERECTOMY;  Surgeon: Kerin Perna, MD;  Location: [facility] OR;  Service: Open Heart Surgery;  Laterality: N/A;  SVG to LAD, SVG to OM, CRYOVEIN to RCA with Endarterectomy  . ENDARTERECTOMY Right 10/20/2015   Procedure: ENDARTERECTOMY CAROTID;  Surgeon: Pryor Ochoa, MD;  Location: [facility] OR;  Service: Vascular;  Laterality: Right;  . LEFT HEART CATH AND CORS/GRAFTS ANGIOGRAPHY N/A 09/13/2016   Procedure: Left Heart Cath and Cors/Grafts Angiography;  Surgeon: Marykay Lex, MD;  Location: [facility] INVASIVE CV LAB;  Service: Cardiovascular;  Laterality: N/A;  .  LEFT HEART CATHETERIZATION WITH CORONARY ANGIOGRAM N/A 07/11/2014   Procedure: LEFT HEART CATHETERIZATION WITH CORONARY ANGIOGRAM;  Surgeon: Peter M Swaziland, MD;  Location: [facility] CATH LAB;  Service: Cardiovascular;  Laterality: N/A;  . LEFT HEART CATHETERIZATION WITH CORONARY/GRAFT ANGIOGRAM N/A 05/09/2011   Procedure: LEFT HEART CATHETERIZATION WITH Isabel Caprice;  Surgeon: Kathleene Hazel, MD;  Location: [facility] CATH LAB;  Service: Cardiovascular;  Laterality: N/A;  . LUMBAR LAMINECTOMY  01/2011  . SHOULDER ARTHROSCOPY W/ ROTATOR CUFF REPAIR Left 1990's  . TEE WITHOUT CARDIOVERSION N/A 10/20/2015   Procedure: TRANSESOPHAGEAL ECHOCARDIOGRAM (TEE);  Surgeon: Kerin Perna, MD;  Location: [facility] OR;  Service: Open Heart Surgery;  Laterality: N/A;  . TONSILLECTOMY AND ADENOIDECTOMY  1951     Current Outpatient Prescriptions  Medication Sig Dispense Refill  . acetaminophen (TYLENOL) 325 MG tablet Take 2 tablets (650 mg total) by mouth every 4 (four) hours as needed for headache or mild pain.    Marland Kitchen amLODipine (NORVASC) 2.5 MG tablet Take 1 tablet (2.5 mg total) by mouth daily. 30 tablet 6  . apixaban (ELIQUIS) 5 MG TABS tablet Take 1 tablet (5 mg total) by mouth 2 (two) times daily. 60 tablet 11  . Artificial Tear Ointment (DRY EYES OP) Place 1 drop into both eyes daily as needed (for dry eyes).    . busPIRone (BUSPAR) 15 MG tablet Take 15 mg by mouth daily.    . carvedilol (COREG) 12.5 MG tablet Take 1 tablet (12.5 mg total) by mouth 2 (two) times daily with a meal. 60 tablet 6  . clopidogrel (PLAVIX) 75 MG tablet Take 1 tablet (75 mg total) by mouth daily. 30 tablet 6  . CYMBALTA 60 MG capsule Take 60 mg by mouth at bedtime.     Marland Kitchen ezetimibe (ZETIA) 10 MG tablet Take 1 tablet (10 mg total) by mouth daily. 30 tablet 11  . furosemide (LASIX) 40 MG tablet Take 1 tablet by mouth twice a day for 3 days then go back down to 1 tablet by mouth daily. 30 tablet 11  . HYDROcodone-acetaminophen (NORCO)  10-325 MG tablet Take 1 tablet by mouth every 6 (six) hours as needed for moderate pain. (Patient taking differently: Take 0.5-2 tablets by mouth every  6 (six) hours as needed for moderate pain. ) 30 tablet 0  . insulin glargine (LANTUS) 100 UNIT/ML injection Inject 64 Units into the skin at bedtime.     . isosorbide mononitrate (IMDUR) 60 MG 24 hr tablet Take 1.5 tablets (90 mg total) by mouth daily. 45 tablet 11  . LINZESS 145 MCG CAPS capsule Take 145 mcg by mouth daily as needed (constipation).     . mirtazapine (REMERON) 15 MG tablet Take 15 mg by mouth at bedtime.     . nitroGLYCERIN (NITROSTAT) 0.4 MG SL tablet Place 1 tablet (0.4 mg total) under the tongue every 5 (five) minutes as needed for chest pain. 25 tablet 4  . NOVOLOG FLEXPEN 100 UNIT/ML FlexPen Inject 24 Units into the skin 2 (two) times daily.     . pantoprazole (PROTONIX) 40 MG tablet Take 1 tablet (40 mg total) by mouth daily. (Patient taking differently: Take 40 mg by mouth daily as needed (heartburn). )    . Pitavastatin Calcium (LIVALO) 1 MG TABS Take 1 tablet (1 mg total) by mouth daily. 30 tablet 0  . potassium chloride SA (K-DUR,KLOR-CON) 20 MEQ tablet Take 1 tablet by mouth twice a day for 3 days then go back down to 1 tablet by mouth daily 30 tablet 11  . QUEtiapine (SEROQUEL) 400 MG tablet Take 200 mg by mouth at bedtime.     . ranolazine (RANEXA) 1000 MG SR tablet Take 1 tablet (1,000 mg total) by mouth 2 (two) times daily. 60 tablet 6   No current facility-administered medications for this visit.     Allergies:   Metformin and related; Pravachol; Ramipril; and Wellbutrin [bupropion hcl]    Social History:  The patient  reports that he has quit smoking. His smoking use included Cigarettes. He has a 100.00 pack-year smoking history. He has quit using smokeless tobacco. His smokeless tobacco use included Chew. He reports that he drinks alcohol. He reports that he does not use drugs.   Family History:  The patient's  family history includes Heart attack in his father; Heart disease in his brother, father, and mother.    ROS:  Please see the history of present illness.   Otherwise, review of systems are positive for none.   All other systems are reviewed and negative.    PHYSICAL EXAM: VS:  BP 134/78 (BP Location: Left Arm, Patient Position: Sitting, Cuff Size: Large)   Pulse 92   Ht 6\' 3"  (1.905 m)   Wt 298 lb 1.9 oz (135.2 kg)   SpO2 90%   BMI 37.26 kg/m  , BMI Body mass index is 37.26 kg/m. GEN:   Obese , elderly man, NAD   HEENT:  No JVD, neck is supple  Neck: no JVD, carotid bruits, or masses Cardiac: RRR; no murmurs, rubs, or gallops,  Trace edema   Respiratory:  clear to auscultation bilaterally, normal work of breathing GI:  Soft, + BS, obese  MS: no deformity or atrophy  Skin: warm and dry, no rash Neuro:  Strength and sensation are intact Psych: euthymic mood, full affect   EKG:  EKG is not ordered today.    Recent Labs: 09/12/2016: B Natriuretic Peptide 124.3 09/18/2016: Hemoglobin 13.1 09/26/2016: NT-Pro BNP 465; Platelets 194 10/15/2016: BUN 16; Creatinine, Ser 1.44; Potassium 4.5; Sodium 140    Lipid Panel    Component Value Date/Time   CHOL 129 09/13/2016 0345   TRIG 299 (H) 09/13/2016 0345   HDL 26 (L) 09/13/2016 0345  CHOLHDL 5.0 09/13/2016 0345   VLDL 60 (H) 09/13/2016 0345   LDLCALC 43 09/13/2016 0345      Wt Readings from Last 3 Encounters:  11/11/16 298 lb 1.9 oz (135.2 kg)  09/26/16 296 lb 3.2 oz (134.4 kg)  09/16/16 (!) 306 lb 14.4 oz (139.2 kg)        ASSESSMENT AND PLAN: 1. Ischemic heart disease.   S/p re-do CABG Hospitalized in March, 2018 with a non-ST segment elevation myocardial infarction. Repeat cardiac catheterization revealed severe native coronary artery disease as well as severe graft disease. He has a patent LIMA  to the LAD but the LAD itself is severely and diffusely diseased. Has a patent saphenous vein graft to an OM.  LV  function is well preserved with EF 55% .    2. Pulmonary embolus - continue Eliquis 5 BID  He may need lifelong anticoagulation   3. Carotid artery disease Still has a left carotid stenosis  Will have him follow up with [facility].   4. Depression 5. Dyslipidemia -  6. Diabetes mellitus type 2 7. Chronic low back pain 8. Mild renal insufficiency    9.  Dental disease :  He stepped 9 teeth pulled. He will probably need to hold his Plavix for 5 days. He should hold his Eliquis for 24 hours prior to his extraction.    Current medicines are reviewed at length with the patient today.  The patient does not have concerns regarding medicines.    Labs/ tests ordered today include:  No orders of the defined types were placed in this encounter.     Kristeen Miss, MD  11/11/2016 4:07 PM    [facility] [address] [place],  [address] [place], [place]  [address] Pager [phone] Phone: [phone]; Fax: [phone]

## 2016-11-21 ENCOUNTER — Telehealth: Payer: Self-pay | Admitting: Cardiovascular Disease

## 2016-11-21 MED ORDER — PITAVASTATIN CALCIUM 1 MG PO TABS: 1.0000 mg | ORAL_TABLET | Freq: Every day | ORAL | 1 refills | Status: AC

## 2016-11-21 NOTE — Telephone Encounter (Signed)
Routing to Lipid clinic because patient is being managed by them

## 2016-11-21 NOTE — Telephone Encounter (Signed)
New message    Pt wife is calling to find out if pt should continue taking livalo 1 mg. She states they were given samples and he will be out by Wednesday.

## 2016-11-21 NOTE — Telephone Encounter (Signed)
Spoke with patient's wife who reports his supply of Livalo is low. I have left 1 box of samples and sent a prescription to the pharmacy for him. His wife states understanding and appreciation. She knows to call if cost-prohibitive for him.

## 2016-12-17 ENCOUNTER — Telehealth: Payer: Self-pay | Admitting: Cardiovascular Disease

## 2016-12-17 NOTE — Telephone Encounter (Signed)
Patient called office to report that he had to use his nitroglycerin last night and again this morning. Patient said that he has trouble breathing but this isn't new and his breathing problem hasn't gotten any worse. Patient admitted to forgetting to take his medications this morning and the chest pain this morning resolved after her took his regular medications and the prn nitro. Patient c/o chest pain this morning rated 7/10 used nitro and resolved and c/o chest pain last night before going to bed rated 7/10 using nitro and CP resolved. Denies active chest pain. No c/o dizziness, sob, n/v, fever, chills or swelling. Patient said he is eating and drinking okay stating he ate gravy/busuits this morning for breakfast. Patient encouraged to eat a low fat diet and encouraged that medication compliance is important to keep away symptoms. Patient encouraged to take his medications on time at the same time each day. Patient informed that if his symptoms returned or got worse that he needed to go to the ED for an evaluation. Patient verbalized understanding of plan.

## 2016-12-17 NOTE — Telephone Encounter (Signed)
°  New Prob  Pt c/o of Chest Pain: STAT if CP now or developed within 24 hours  1. Are you having CP right now? No  2. Are you experiencing any other symptoms (ex. SOB, nausea, vomiting, sweating)? SOB  3. How long have you been experiencing CP? On and off for about a year, but worsened in the last 3 weeks  4. Is your CP continuous or coming and going? Comes and goes  5. Have you taken Nitroglycerin? 3 today ?

## 2016-12-17 NOTE — Telephone Encounter (Signed)
Agree with note from Carlye GrippeLydia Anderson, LPN. He needs to take his meds regularly and he should always try to eat a low fat diet.

## 2016-12-18 ENCOUNTER — Observation Stay (HOSPITAL_COMMUNITY)
Admission: EM | Admit: 2016-12-18 | Discharge: 2016-12-20 | Disposition: A | Discharge status: Home / Self Care | Payer: Medicare Other | Attending: Cardiovascular Disease | Admitting: Cardiovascular Disease

## 2016-12-18 ENCOUNTER — Emergency Department (HOSPITAL_COMMUNITY): Payer: Medicare Other

## 2016-12-18 ENCOUNTER — Encounter (HOSPITAL_COMMUNITY): Payer: Self-pay | Admitting: *Deleted

## 2016-12-18 DIAGNOSIS — I10 Essential (primary) hypertension: Secondary | ICD-10-CM | POA: Diagnosis present

## 2016-12-18 DIAGNOSIS — Z87891 Personal history of nicotine dependence: Secondary | ICD-10-CM | POA: Diagnosis not present

## 2016-12-18 DIAGNOSIS — E119 Type 2 diabetes mellitus without complications: Secondary | ICD-10-CM | POA: Diagnosis not present

## 2016-12-18 DIAGNOSIS — I2782 Chronic pulmonary embolism: Secondary | ICD-10-CM | POA: Diagnosis not present

## 2016-12-18 DIAGNOSIS — Z7901 Long term (current) use of anticoagulants: Secondary | ICD-10-CM | POA: Diagnosis not present

## 2016-12-18 DIAGNOSIS — I214 Non-ST elevation (NSTEMI) myocardial infarction: Secondary | ICD-10-CM | POA: Insufficient documentation

## 2016-12-18 DIAGNOSIS — I129 Hypertensive chronic kidney disease with stage 1 through stage 4 chronic kidney disease, or unspecified chronic kidney disease: Secondary | ICD-10-CM | POA: Insufficient documentation

## 2016-12-18 DIAGNOSIS — Z794 Long term (current) use of insulin: Secondary | ICD-10-CM | POA: Insufficient documentation

## 2016-12-18 DIAGNOSIS — R079 Chest pain, unspecified: Secondary | ICD-10-CM | POA: Diagnosis not present

## 2016-12-18 DIAGNOSIS — I25119 Atherosclerotic heart disease of native coronary artery with unspecified angina pectoris: Secondary | ICD-10-CM | POA: Diagnosis not present

## 2016-12-18 DIAGNOSIS — Z7902 Long term (current) use of antithrombotics/antiplatelets: Secondary | ICD-10-CM | POA: Diagnosis not present

## 2016-12-18 DIAGNOSIS — Z79899 Other long term (current) drug therapy: Secondary | ICD-10-CM | POA: Insufficient documentation

## 2016-12-18 DIAGNOSIS — I5031 Acute diastolic (congestive) heart failure: Secondary | ICD-10-CM | POA: Diagnosis not present

## 2016-12-18 DIAGNOSIS — I200 Unstable angina: Secondary | ICD-10-CM | POA: Diagnosis not present

## 2016-12-18 DIAGNOSIS — N183 Chronic kidney disease, stage 3 unspecified: Secondary | ICD-10-CM | POA: Diagnosis present

## 2016-12-18 DIAGNOSIS — E669 Obesity, unspecified: Secondary | ICD-10-CM

## 2016-12-18 DIAGNOSIS — I209 Angina pectoris, unspecified: Secondary | ICD-10-CM | POA: Diagnosis present

## 2016-12-18 DIAGNOSIS — E785 Hyperlipidemia, unspecified: Secondary | ICD-10-CM | POA: Diagnosis present

## 2016-12-18 DIAGNOSIS — Z951 Presence of aortocoronary bypass graft: Secondary | ICD-10-CM

## 2016-12-18 DIAGNOSIS — I119 Hypertensive heart disease without heart failure: Secondary | ICD-10-CM | POA: Diagnosis present

## 2016-12-18 DIAGNOSIS — E1169 Type 2 diabetes mellitus with other specified complication: Secondary | ICD-10-CM | POA: Diagnosis present

## 2016-12-18 DIAGNOSIS — I2510 Atherosclerotic heart disease of native coronary artery without angina pectoris: Secondary | ICD-10-CM | POA: Diagnosis present

## 2016-12-18 DIAGNOSIS — R0602 Shortness of breath: Secondary | ICD-10-CM | POA: Diagnosis present

## 2016-12-18 LAB — BASIC METABOLIC PANEL
Anion gap: 8 (ref 5–15)
BUN: 19 mg/dL (ref 6–20)
CO2: 29 mmol/L (ref 22–32)
Calcium: 8.5 mg/dL — ABNORMAL LOW (ref 8.9–10.3)
Chloride: 103 mmol/L (ref 101–111)
Creatinine, Ser: 1.8 mg/dL — ABNORMAL HIGH (ref 0.61–1.24)
GFR calc Af Amer: 41 mL/min — ABNORMAL LOW (ref >60)
GFR calc non Af Amer: 36 mL/min — ABNORMAL LOW (ref >60)
Glucose, Bld: 229 mg/dL — ABNORMAL HIGH (ref 65–99)
Potassium: 4.2 mmol/L (ref 3.5–5.1)
Sodium: 140 mmol/L (ref 135–145)

## 2016-12-18 LAB — CBC
HCT: 33.1 % — ABNORMAL LOW (ref 39.0–52.0)
Hemoglobin: 10.5 g/dL — ABNORMAL LOW (ref 13.0–17.0)
MCH: 30.7 pg (ref 26.0–34.0)
MCHC: 31.7 g/dL (ref 30.0–36.0)
MCV: 96.8 fL (ref 78.0–100.0)
Platelets: 123 10*3/uL — ABNORMAL LOW (ref 150–400)
RBC: 3.42 MIL/uL — ABNORMAL LOW (ref 4.22–5.81)
RDW: 15.8 % — ABNORMAL HIGH (ref 11.5–15.5)
WBC: 5.1 10*3/uL (ref 4.0–10.5)

## 2016-12-18 LAB — TROPONIN I
TROPONIN I: 1.41 ng/mL — AB (ref <0.03)
Troponin I: 0.06 ng/mL (ref <0.03)
Troponin I: 1.11 ng/mL (ref <0.03)

## 2016-12-18 LAB — GLUCOSE, CAPILLARY
GLUCOSE-CAPILLARY: 143 mg/dL — AB (ref 65–99)
GLUCOSE-CAPILLARY: 235 mg/dL — AB (ref 65–99)

## 2016-12-18 MED ORDER — MIRTAZAPINE 15 MG PO TABS
15.0000 mg | ORAL_TABLET | Freq: Every day | ORAL | Status: DC
  Administered 2016-12-18 – 2016-12-19 (×2): 15 mg via ORAL
  Filled 2016-12-18 (×2): qty 1

## 2016-12-18 MED ORDER — CLOPIDOGREL BISULFATE 75 MG PO TABS
75.0000 mg | ORAL_TABLET | ORAL | Status: DC
  Administered 2016-12-19 – 2016-12-20 (×2): 75 mg via ORAL
  Filled 2016-12-18 (×2): qty 1

## 2016-12-18 MED ORDER — EZETIMIBE 10 MG PO TABS
10.0000 mg | ORAL_TABLET | Freq: Every day | ORAL | Status: DC
Start: 2016-12-18 — End: 2016-12-20
  Administered 2016-12-19 – 2016-12-20 (×2): 10 mg via ORAL
  Filled 2016-12-18 (×2): qty 1

## 2016-12-18 MED ORDER — FUROSEMIDE 20 MG PO TABS
40.0000 mg | ORAL_TABLET | Freq: Every day | ORAL | Status: DC
  Administered 2016-12-19 – 2016-12-20 (×2): 40 mg via ORAL
  Filled 2016-12-18 (×2): qty 2

## 2016-12-18 MED ORDER — SODIUM CHLORIDE 0.9 % IV SOLN: 250.0000 mL | INTRAVENOUS | Status: DC | PRN

## 2016-12-18 MED ORDER — ISOSORBIDE MONONITRATE ER 30 MG PO TB24
90.0000 mg | ORAL_TABLET | Freq: Every day | ORAL | Status: DC
Start: 2016-12-18 — End: 2016-12-20
  Administered 2016-12-19 – 2016-12-20 (×2): 90 mg via ORAL
  Filled 2016-12-18 (×2): qty 3

## 2016-12-18 MED ORDER — DULOXETINE HCL 60 MG PO CPEP
60.0000 mg | ORAL_CAPSULE | Freq: Every day | ORAL | Status: DC
  Administered 2016-12-18 – 2016-12-19 (×2): 60 mg via ORAL
  Filled 2016-12-18 (×2): qty 1

## 2016-12-18 MED ORDER — BUSPIRONE HCL 10 MG PO TABS
15.0000 mg | ORAL_TABLET | Freq: Every day | ORAL | Status: DC
Start: 2016-12-19 — End: 2016-12-20
  Administered 2016-12-19 – 2016-12-20 (×2): 15 mg via ORAL
  Filled 2016-12-18 (×2): qty 2

## 2016-12-18 MED ORDER — SODIUM CHLORIDE 0.9% FLUSH
3.0000 mL | Freq: Two times a day (BID) | INTRAVENOUS | Status: DC
  Administered 2016-12-18 – 2016-12-19 (×3): 3 mL via INTRAVENOUS

## 2016-12-18 MED ORDER — PANTOPRAZOLE SODIUM 40 MG PO TBEC
40.0000 mg | DELAYED_RELEASE_TABLET | Freq: Every day | ORAL | Status: DC
Start: 2016-12-18 — End: 2016-12-20
  Administered 2016-12-19 – 2016-12-20 (×2): 40 mg via ORAL
  Filled 2016-12-18 (×2): qty 1

## 2016-12-18 MED ORDER — ASPIRIN 81 MG PO CHEW: 324.0000 mg | CHEWABLE_TABLET | ORAL | Status: AC

## 2016-12-18 MED ORDER — SODIUM CHLORIDE 0.9% FLUSH: 3.0000 mL | INTRAVENOUS | Status: DC | PRN

## 2016-12-18 MED ORDER — NITROGLYCERIN 0.4 MG SL SUBL: 0.4000 mg | SUBLINGUAL_TABLET | SUBLINGUAL | Status: DC | PRN

## 2016-12-18 MED ORDER — LINACLOTIDE 145 MCG PO CAPS
145.0000 ug | ORAL_CAPSULE | Freq: Every day | ORAL | Status: DC | PRN
  Administered 2016-12-19: 145 ug via ORAL
  Filled 2016-12-18: qty 1

## 2016-12-18 MED ORDER — APIXABAN 5 MG PO TABS
5.0000 mg | ORAL_TABLET | Freq: Two times a day (BID) | ORAL | Status: DC
  Administered 2016-12-18 – 2016-12-20 (×4): 5 mg via ORAL
  Filled 2016-12-18 (×4): qty 1

## 2016-12-18 MED ORDER — AMLODIPINE BESYLATE 5 MG PO TABS
2.5000 mg | ORAL_TABLET | Freq: Every day | ORAL | Status: DC
  Administered 2016-12-19 – 2016-12-20 (×2): 2.5 mg via ORAL
  Filled 2016-12-18 (×2): qty 1

## 2016-12-18 MED ORDER — RANOLAZINE ER 500 MG PO TB12
1000.0000 mg | ORAL_TABLET | Freq: Two times a day (BID) | ORAL | Status: DC
  Administered 2016-12-18 – 2016-12-20 (×4): 1000 mg via ORAL
  Filled 2016-12-18 (×4): qty 2

## 2016-12-18 MED ORDER — ACETAMINOPHEN 325 MG PO TABS: 650.0000 mg | ORAL_TABLET | ORAL | Status: DC | PRN

## 2016-12-18 MED ORDER — QUETIAPINE FUMARATE 200 MG PO TABS
200.0000 mg | ORAL_TABLET | Freq: Every day | ORAL | Status: DC
  Administered 2016-12-18 – 2016-12-19 (×2): 200 mg via ORAL
  Filled 2016-12-18: qty 1
  Filled 2016-12-18 (×2): qty 4
  Filled 2016-12-18: qty 1

## 2016-12-18 MED ORDER — NITROGLYCERIN 0.4 MG SL SUBL
0.4000 mg | SUBLINGUAL_TABLET | SUBLINGUAL | Status: DC | PRN
  Administered 2016-12-19: 0.4 mg via SUBLINGUAL
  Filled 2016-12-18: qty 1

## 2016-12-18 MED ORDER — CARVEDILOL 12.5 MG PO TABS
37.5000 mg | ORAL_TABLET | Freq: Two times a day (BID) | ORAL | Status: DC
  Administered 2016-12-18 – 2016-12-20 (×4): 37.5 mg via ORAL
  Filled 2016-12-18 (×2): qty 3
  Filled 2016-12-18: qty 6
  Filled 2016-12-18 (×2): qty 3

## 2016-12-18 MED ORDER — POTASSIUM CHLORIDE CRYS ER 20 MEQ PO TBCR
20.0000 meq | EXTENDED_RELEASE_TABLET | Freq: Every day | ORAL | Status: DC
  Administered 2016-12-19 – 2016-12-20 (×2): 20 meq via ORAL
  Filled 2016-12-18 (×2): qty 1

## 2016-12-18 MED ORDER — ONDANSETRON HCL 4 MG/2ML IJ SOLN: 4.0000 mg | Freq: Four times a day (QID) | INTRAMUSCULAR | Status: DC | PRN

## 2016-12-18 MED ORDER — ASPIRIN 300 MG RE SUPP: 300.0000 mg | RECTAL | Status: AC

## 2016-12-18 NOTE — H&P (Addendum)
Cardiology History & Physical    Patient ID: KAELUM MCANULTY MRN: [id number], DOB: [birth year] Date of Encounter: 12/18/2016, 1:45 PM Primary Physician: Kaleen Mask, MD Primary Cardiologist: Maja Mccaffery  Chief Complaint: SOB and chest pain Reason for Admission: Angina Requesting MD: Dr. Juleen China  HPI: NICASIO YARGER is a 71 y.o. male with history of CAD s/p CABG x 2 in 2001 and 2017, BMS placed 2012 to saphenous vein graft, hypertension, diabetes, dyslipidemia, pulmonary embolism, RBBB, obesity.  Patient last seen in the office by Dr. Elease Hashimoto on 11/11/2016 for follow-up office visit. He has known ischemic heart disease. Underwent a cardiac catheterization most recently in March of 2018, he has severe diffuse native coronary disease with very poor PCI targets. He also has 2/3 new vein grafts occluded including the new vein graft to RCA and new vein graft to distal LAD. In addition to the new vein graft disease, he has progression of disease in the native LAD-diagonal system as well as the AV groove circumflex system. The AV groove system arise collaterals to the distal RCA. Pt does not have any immediately favorable PCI targets, recommendation for medical management.  He presented to the ER today with complaints of pain between his shoulder blades that feels like a knife being stabbed to his back the past 3 days. This feels like his previous cardiac pain. He has also had associated tightness in his chest,  BLE edema that he describes as being chronic in nature,  intermittent light headedness and vision changes, resolved on its own at 5-10 minutes. Marland Kitchen Has been taking NTG every day this week for his symptoms. He took 3 sl NTG today.when his pain became 10/10 and unmanageable he decided to seek care. Now he is having 3/10 pain and it is bearable.   Troponin is 0.06, previous admission in March it was 1.5.   Past Medical History:  Diagnosis Date  . Acute diastolic HF (heart failure) (HCC) 09/18/2016    . Anemia   . Anxiety   . Arthritis    "back, neck" (07/11/2014)  . CAD (coronary artery disease) with CABG 09/2015  01/15/2011  . Chronic lower back pain   . Complication of anesthesia    "they have a hard time waking him up"  . Coronary artery disease    a. 2001: s/p CABG X4;  b. 08/2010 s/p PCI/BMS to VG->RI->OM;  c. 06/2014 Cath/PCI: LM nl, LAD 192m, LCX 19m, RI 100, OM1 100, OM2 80, small, RCA 100ost, VG->RCA 100, VG->Diag 100, VG->RI->OM1 70-80 ISR (4.0x24 Promus DES), LIMA->LAD nl.  . Depression   . Dyslipidemia   . Dysrhythmia   . Essential hypertension   . GERD (gastroesophageal reflux disease)   . History of blood transfusion 2001   "related to OHS"  . Morbid obesity (HCC)   . Nephrolithiasis   . RBBB   . Renal insufficiency    a. noted 06/2014 - baseline unclear as no data since 2012.  . Thrombocytopenia (HCC)    a. Borderline low platelets by labs noted in 06/2014 but also noted on prior labs as well.  . Type II diabetes mellitus (HCC)      Surgical History:  Past Surgical History:  Procedure Laterality Date  . APPENDECTOMY  ~ 1955  . BACK SURGERY    . CARDIAC CATHETERIZATION  2001; ~ 2010  . CARDIAC CATHETERIZATION N/A 10/16/2015   Procedure: Left Heart Cath and Cors/Grafts Angiography;  Surgeon: Corky Crafts, MD;  Location: [facility] INVASIVE  CV LAB;  Service: Cardiovascular;  Laterality: N/A;  . CARDIOVASCULAR STRESS TEST  07/03/2009   EF 64%  . CHOLECYSTECTOMY    . CORONARY ANGIOPLASTY WITH STENT PLACEMENT  March 2012   BMS to SVG to intermediate/OM  . CORONARY ANGIOPLASTY WITH STENT PLACEMENT  07/11/2014   "1"  . CORONARY ARTERY BYPASS GRAFT  2001   "CABG X 5"  . CORONARY ARTERY BYPASS GRAFT N/A 10/20/2015   Procedure: REDO CORONARY ARTERY BYPASS GRAFTING (CABG), TIMES THREE, USING LEFT GREATER SAPHENOUS VEIN HARVESTED ENDOSCOPICALLY AND CRYO VEIN, WITH CORONARY ENDARTERECTOMY;  Surgeon: Kerin Perna, MD;  Location: [facility] OR;  Service: Open Heart Surgery;   Laterality: N/A;  SVG to LAD, SVG to OM, CRYOVEIN to RCA with Endarterectomy  . ENDARTERECTOMY Right 10/20/2015   Procedure: ENDARTERECTOMY CAROTID;  Surgeon: Pryor Ochoa, MD;  Location: [facility] OR;  Service: Vascular;  Laterality: Right;  . LEFT HEART CATH AND CORS/GRAFTS ANGIOGRAPHY N/A 09/13/2016   Procedure: Left Heart Cath and Cors/Grafts Angiography;  Surgeon: Marykay Lex, MD;  Location: [facility] INVASIVE CV LAB;  Service: Cardiovascular;  Laterality: N/A;  . LEFT HEART CATHETERIZATION WITH CORONARY ANGIOGRAM N/A 07/11/2014   Procedure: LEFT HEART CATHETERIZATION WITH CORONARY ANGIOGRAM;  Surgeon: Peter M Swaziland, MD;  Location: [facility] CATH LAB;  Service: Cardiovascular;  Laterality: N/A;  . LEFT HEART CATHETERIZATION WITH CORONARY/GRAFT ANGIOGRAM N/A 05/09/2011   Procedure: LEFT HEART CATHETERIZATION WITH Isabel Caprice;  Surgeon: Kathleene Hazel, MD;  Location: [facility] CATH LAB;  Service: Cardiovascular;  Laterality: N/A;  . LUMBAR LAMINECTOMY  01/2011  . SHOULDER ARTHROSCOPY W/ ROTATOR CUFF REPAIR Left 1990's  . TEE WITHOUT CARDIOVERSION N/A 10/20/2015   Procedure: TRANSESOPHAGEAL ECHOCARDIOGRAM (TEE);  Surgeon: Kerin Perna, MD;  Location: [facility] OR;  Service: Open Heart Surgery;  Laterality: N/A;  . TONSILLECTOMY AND ADENOIDECTOMY  1951     Home Meds: Prior to Admission medications   Medication Sig Start Date End Date Taking? Authorizing Provider  acetaminophen (TYLENOL) 325 MG tablet Take 2 tablets (650 mg total) by mouth every 4 (four) hours as needed for headache or mild pain. 09/18/16   Leone Brand, NP  amLODipine (NORVASC) 2.5 MG tablet Take 1 tablet (2.5 mg total) by mouth daily. 09/18/16   Leone Brand, NP  apixaban (ELIQUIS) 5 MG TABS tablet Take 1 tablet (5 mg total) by mouth 2 (two) times daily. 01/10/16   Stephfon Bovey, Deloris Ping, MD  Artificial Tear Ointment (DRY EYES OP) Place 1 drop into both eyes daily as needed (for dry eyes).    [provider]  busPIRone (BUSPAR)  15 MG tablet Take 15 mg by mouth daily. 08/22/15   [provider]  carvedilol (COREG) 25 MG tablet Take 1 tablet (25 mg total) by mouth 2 (two) times daily. 11/11/16 02/09/17  Bree Heinzelman, Deloris Ping, MD  clopidogrel (PLAVIX) 75 MG tablet Take 1 tablet (75 mg total) by mouth daily. 09/18/16   Leone Brand, NP  CYMBALTA 60 MG capsule Take 60 mg by mouth at bedtime.  03/30/12   [provider]  ezetimibe (ZETIA) 10 MG tablet Take 1 tablet (10 mg total) by mouth daily. 01/10/16   Keondra Haydu, Deloris Ping, MD  furosemide (LASIX) 40 MG tablet Take 1 tablet by mouth twice a day for 3 days then go back down to 1 tablet by mouth daily. 09/27/16   Manson Passey, PA  HYDROcodone-acetaminophen (NORCO) 10-325 MG tablet Take 1 tablet by mouth every 6 (six) hours as  needed for moderate pain. Patient taking differently: Take 0.5-2 tablets by mouth every 6 (six) hours as needed for moderate pain.  11/13/15   Barrett, Erin R, PA-C  insulin glargine (LANTUS) 100 UNIT/ML injection Inject 64 Units into the skin at bedtime.     [provider]  isosorbide mononitrate (IMDUR) 60 MG 24 hr tablet Take 1.5 tablets (90 mg total) by mouth daily. 09/26/16   Bhagat, Sharrell Ku, PA  LINZESS 145 MCG CAPS capsule Take 145 mcg by mouth daily as needed (constipation).  09/28/15   [provider]  mirtazapine (REMERON) 15 MG tablet Take 15 mg by mouth at bedtime.  03/30/12   [provider]  nitroGLYCERIN (NITROSTAT) 0.4 MG SL tablet Place 1 tablet (0.4 mg total) under the tongue every 5 (five) minutes as needed for chest pain. 09/18/16   Leone Brand, NP  NOVOLOG FLEXPEN 100 UNIT/ML FlexPen Inject 24 Units into the skin 2 (two) times daily.  07/01/13   [provider]  pantoprazole (PROTONIX) 40 MG tablet Take 1 tablet (40 mg total) by mouth daily. Patient taking differently: Take 40 mg by mouth daily as needed (heartburn).  11/14/15   Barrett, Rae Roam, PA-C  Pitavastatin Calcium (LIVALO) 1 MG TABS  Take 1 tablet (1 mg total) by mouth daily. 11/21/16   Aino Heckert, Deloris Ping, MD  potassium chloride SA (K-DUR,KLOR-CON) 20 MEQ tablet Take 1 tablet by mouth twice a day for 3 days then go back down to 1 tablet by mouth daily 09/27/16   Bhagat, Bhavinkumar, PA  QUEtiapine (SEROQUEL) 400 MG tablet Take 200 mg by mouth at bedtime.     [provider]  ranolazine (RANEXA) 1000 MG SR tablet Take 1 tablet (1,000 mg total) by mouth 2 (two) times daily. 09/18/16   Leone Brand, NP    Allergies:  Allergies  Allergen Reactions  . Metformin And Related Nausea And Vomiting  . Pravachol Nausea And Vomiting  . Ramipril Nausea Only  . Wellbutrin [Bupropion Hcl] Nausea And Vomiting    Social History   Social History  . Marital status: Divorced    Spouse name: N/A  . Number of children: N/A  . Years of education: N/A   Occupational History  . Not on file.   Social History Main Topics  . Smoking status: Former Smoker    Packs/day: 4.00    Years: 25.00    Types: Cigarettes  . Smokeless tobacco: Former Neurosurgeon    Types: Chew     Comment: "quit smoking ~ 1985; chewed off and on for a few years; quit chewing in ~ 1985 too"  . Alcohol use Yes     Comment: 07/11/2014 "might take a drink 5X/yr"  . Drug use: No  . Sexual activity: No   Other Topics Concern  . Not on file   Social History Narrative  . No narrative on file     Family History  Problem Relation Age of Onset  . Heart disease Mother   . Heart disease Father   . Heart attack Father   . Heart disease Brother     Review of Systems General: negative for chills, fever, night sweats or weight changes.  Cardiovascular: negative for palpitations, paroxysmal nocturnal dyspnea, Dermatological: negative for rash Respiratory: negative for cough or wheezing Urologic: negative for hematuria Abdominal: negative for nausea, vomiting, diarrhea, bright red blood per rectum, melena, or hematemesis Neurologic: negative for visual changes,  syncope, or dizziness All other systems reviewed and are  otherwise negative except as noted above.  Labs:  Lab Results  Component Value Date   WBC 5.1 12/18/2016   HGB 10.5 (L) 12/18/2016   HCT 33.1 (L) 12/18/2016   MCV 96.8 12/18/2016   PLT 123 (L) 12/18/2016    Recent Labs Lab 12/18/16 1039  NA 140  K 4.2  CL 103  CO2 29  BUN 19  CREATININE 1.80*  CALCIUM 8.5*  GLUCOSE 229*    Recent Labs  12/18/16 1039  TROPONINI 0.06*   Lab Results  Component Value Date   CHOL 129 09/13/2016   HDL 26 (L) 09/13/2016   LDLCALC 43 09/13/2016   TRIG 299 (H) 09/13/2016   No results found for: DDIMER  Radiology/Studies:  Dg Chest 2 View  Result Date: 12/18/2016 CLINICAL DATA:  Mid chest discomfort with shortness of breath for 3-4 days, stabbing pain in back in between shoulder blades, history of coronary artery disease post NSTEMI and CABG, acute diastolic CHF, type II diabetes mellitus, essential hypertension, former smoker EXAM: CHEST  2 VIEW COMPARISON:  09/10/2016 FINDINGS: Borderline enlargement of cardiac silhouette post CABG. Mediastinal contours and pulmonary vascularity normal. Atherosclerotic calcification aorta. Lungs grossly clear. No definite infiltrate, pleural effusion or pneumothorax. Bones unremarkable. IMPRESSION: Post CABG. No acute abnormalities. Aortic Atherosclerosis (ICD10-I70.0). Electronically Signed   By: Ulyses Southward M.D.   On: 12/18/2016 11:36   Wt Readings from Last 3 Encounters:  12/18/16 (!) 304 lb 3.8 oz (138 kg)  11/11/16 298 lb 1.9 oz (135.2 kg)  09/26/16 296 lb 3.2 oz (134.4 kg)    EKG: SR, RBBB  Physical Exam: Blood pressure 103/87, pulse 93, temperature 98 F (36.7 C), temperature source Oral, resp. rate 19, weight (!) 304 lb 3.8 oz (138 kg), SpO2 95 %. Body mass index is 38.03 kg/m. General: Well developed, well nourished, in no acute distress. + obese Head: Normocephalic, atraumatic, sclera non-icteric, no xanthomas, nares are without  discharge.  Neck: Negative for carotid bruits. JVD not elevated. Lungs: Clear bilaterally to auscultation without wheezes, rales, or rhonchi. Breathing is unlabored. Heart: RRR with S1 S2. No murmurs, rubs, or gallops appreciated. Abdomen: Soft, non-tender, non-distended with normoactive bowel sounds. No hepatomegaly. No rebound/guarding. No obvious abdominal masses. Msk:  Strength and tone appear normal for age. Extremities: No clubbing or cyanosis.Distal pedal pulses are 2+ and equal bilaterally. + lower extremity edema. Neuro: Alert and oriented X 3. No focal deficit. No facial asymmetry. Moves all extremities spontaneously. Psych:  Responds to questions appropriately with a normal affect.    Assessment and Plan   1. Severe multivessel coronary artery disease, hx of CABG and stent placement: mildly elevated troponin, no acute ischemic changes to EKG -- had catheterization in March of 2018 and is not a candidate for PCI or CABG due to poor targets. Unfortunately he is already on 60 mg of Imdur and 1000 mg Ranexa, uses his Nitroglycerin and this is still not managing his pain. He had his Coreg dose increased at his previous visit to 25 mg BID, we have room to increase this further. --  Will admit to telemetry and cycle enzymes --  Increase Coreg 37.5 mg BID  2. Hypertension: BP stable   3. Hyperlipidemia: Pt continues to eat a diet high in salt and is obese.     Suan Halter PA-C 12/18/2016, 1:45 PM   Attending Note:   The patient was seen and examined.  Agree with assessment and plan as noted above.  Changes made to  the above note as needed.  Patient seen and independently examined with Jovita Kussmaul .   We discussed all aspects of the encounter. I agree with the assessment and plan as stated above.  1.  Unstable angina:  I have personally reviewed his cath from earlier this year.   He has severe native CAD. I do not think we have any revascularization  possibilities - PCI or CABG  I have given him information about the book "Prevent and reverse heart disease "by Dr. Clarice Pole.  He eats fast foods daily and makes no effort to exercise.  Will keep overnight Increase coreg to 37.5 BID Cycle troponins for prognostic purposes. I anticipate DC tomorrow ( likely )   We discussed getting Palliative care or Hospice involved. He does not want to consider those options yet Wants to try a strict diet and weight loss program .   2. HTN:  Stable    I have spent a total of 40 minutes with patient reviewing hospital  notes , telemetry, EKGs, labs and examining patient as well as establishing an assessment and plan that was discussed with the patient. > 50% of time was spent in direct patient care.    Vesta Mixer, Montez Hageman., MD, [facility] 12/18/2016, 3:44 PM [address],  [address] Office [phone] Pager [phone]

## 2016-12-18 NOTE — ED Triage Notes (Signed)
Pt reports cardiac hx. Pt having mid chest discomfort with sob x 3-4 days. Reports stabbing pain in his back between his shoulder blades. spo2 88% on room air at triage.

## 2016-12-18 NOTE — ED Provider Notes (Signed)
[facility]-EMERGENCY DEPT Provider Note    By signing my name below, I, Earmon Phoenix, attest that this documentation has been prepared under the direction and in the presence of Raeford Razor, MD. Electronically Signed: Earmon Phoenix, ED Scribe. 12/18/16. 1:38 PM.    History   Chief Complaint Chief Complaint  Patient presents with  . Shortness of Breath  . Chest Pain  . Back Pain    The history is provided by the patient and medical records. No language interpreter was used.    Tyler Le is an obese 71 y.o. male who presents to the Emergency Department complaining of SOB that began about one week ago. He reports associated stabbing pain between his shoulder blades and pressure and tightness in the chest that began about one week ago as well. He reports BLE swelling but states it is unchanged and chronic in nature. He has been taking Nitroglycerin all week with temporary relief of his pain. He states he took three Nitroglycerin tablets today for his symptoms. Exertion increases the SOB but he still experience it at rest. He denies alleviating factors. He denies nausea, vomiting. He reports h/o CABG and states his cardiologist, Dr. Elease Hashimoto, states he can only be treated medically at this point. His last appt was 2-3 weeks ago. Of note, pt states he has been experiencing some intermittent light headedness and states his vision as been going dark recently but resolves on its own after about 5-10 minutes. This occurs with position changes like standing or sitting from a lying position.   Past Medical History:  Diagnosis Date  . Acute diastolic HF (heart failure) (HCC) 09/18/2016  . Anemia   . Anxiety   . Arthritis    "back, neck" (07/11/2014)  . CAD (coronary artery disease) with CABG 09/2015  01/15/2011  . Chronic lower back pain   . Complication of anesthesia    "they have a hard time waking him up"  . Coronary artery disease    a. 2001: s/p CABG X4;  b. 08/2010 s/p PCI/BMS to  VG->RI->OM;  c. 06/2014 Cath/PCI: LM nl, LAD 193m, LCX 65m, RI 100, OM1 100, OM2 80, small, RCA 100ost, VG->RCA 100, VG->Diag 100, VG->RI->OM1 70-80 ISR (4.0x24 Promus DES), LIMA->LAD nl.  . Depression   . Dyslipidemia   . Dysrhythmia   . Essential hypertension   . GERD (gastroesophageal reflux disease)   . History of blood transfusion 2001   "related to OHS"  . Morbid obesity (HCC)   . Nephrolithiasis   . RBBB   . Renal insufficiency    a. noted 06/2014 - baseline unclear as no data since 2012.  . Thrombocytopenia (HCC)    a. Borderline low platelets by labs noted in 06/2014 but also noted on prior labs as well.  . Type II diabetes mellitus [facility])     Patient Active Problem List   Diagnosis Date Noted  . Acute diastolic HF (heart failure) (HCC) 09/18/2016  . NSTEMI (non-ST elevated myocardial infarction) (HCC) 09/12/2016  . Carotid stenosis, asymptomatic 12/19/2015  . Elevated troponin   . Chest pain 12/03/2015  . Hypoxemia 12/02/2015  . Pulmonary embolism (HCC) 12/02/2015  . Ileus (HCC)   . Tracheostomy in place [facility])   . Debility   . Benign essential HTN   . Adjustment disorder with mixed anxiety and depressed mood   . Obesity   . Chronic low back pain   . OSA (obstructive sleep apnea)   . Dysphagia   . Tachycardia   .  Tachypnea   . Left lower lobe pneumonia (HCC)   . Tracheostomy care (HCC)   . Atelectasis   . Feeding tube dysfunction   . Acute respiratory failure with hypoxemia (HCC)   . Altered mental status   . Coronary artery disease involving coronary bypass graft of native heart with angina pectoris (HCC)   . Coronary artery disease involving native coronary artery of native heart without angina pectoris   . CKD (chronic kidney disease), stage III 10/17/2015  . Unstable angina (HCC) 10/16/2015  . AKI (acute kidney injury) (HCC) 08/01/2014  . Renal failure 08/01/2014  . Gastroenteritis, acute 08/01/2014  . Diabetes mellitus type 2 in obese (HCC) 08/01/2014  .  Hypertension   . Essential hypertension   . Depression 05/08/2011  . CAD (coronary artery disease) with re-do CABG 09/2015 (first CABG in 2001)  01/15/2011  . S/P CABG (coronary artery bypass graft) 10/03/2010  . Type II diabetes mellitus (HCC) 10/03/2010  . Dyslipidemia 10/03/2010  . Low back pain 10/03/2010  . Anxiety 10/03/2010  . Benign hypertensive heart disease without heart failure 10/03/2010    Past Surgical History:  Procedure Laterality Date  . APPENDECTOMY  ~ 1955  . BACK SURGERY    . CARDIAC CATHETERIZATION  2001; ~ 2010  . CARDIAC CATHETERIZATION N/A 10/16/2015   Procedure: Left Heart Cath and Cors/Grafts Angiography;  Surgeon: Corky Crafts, MD;  Location: [facility] INVASIVE CV LAB;  Service: Cardiovascular;  Laterality: N/A;  . CARDIOVASCULAR STRESS TEST  07/03/2009   EF 64%  . CHOLECYSTECTOMY    . CORONARY ANGIOPLASTY WITH STENT PLACEMENT  March 2012   BMS to SVG to intermediate/OM  . CORONARY ANGIOPLASTY WITH STENT PLACEMENT  07/11/2014   "1"  . CORONARY ARTERY BYPASS GRAFT  2001   "CABG X 5"  . CORONARY ARTERY BYPASS GRAFT N/A 10/20/2015   Procedure: REDO CORONARY ARTERY BYPASS GRAFTING (CABG), TIMES THREE, USING LEFT GREATER SAPHENOUS VEIN HARVESTED ENDOSCOPICALLY AND CRYO VEIN, WITH CORONARY ENDARTERECTOMY;  Surgeon: Kerin Perna, MD;  Location: [facility] OR;  Service: Open Heart Surgery;  Laterality: N/A;  SVG to LAD, SVG to OM, CRYOVEIN to RCA with Endarterectomy  . ENDARTERECTOMY Right 10/20/2015   Procedure: ENDARTERECTOMY CAROTID;  Surgeon: Pryor Ochoa, MD;  Location: [facility] OR;  Service: Vascular;  Laterality: Right;  . LEFT HEART CATH AND CORS/GRAFTS ANGIOGRAPHY N/A 09/13/2016   Procedure: Left Heart Cath and Cors/Grafts Angiography;  Surgeon: Marykay Lex, MD;  Location: [facility] INVASIVE CV LAB;  Service: Cardiovascular;  Laterality: N/A;  . LEFT HEART CATHETERIZATION WITH CORONARY ANGIOGRAM N/A 07/11/2014   Procedure: LEFT HEART CATHETERIZATION WITH CORONARY ANGIOGRAM;   Surgeon: Peter M Swaziland, MD;  Location: [facility] CATH LAB;  Service: Cardiovascular;  Laterality: N/A;  . LEFT HEART CATHETERIZATION WITH CORONARY/GRAFT ANGIOGRAM N/A 05/09/2011   Procedure: LEFT HEART CATHETERIZATION WITH Isabel Caprice;  Surgeon: Kathleene Hazel, MD;  Location: [facility] CATH LAB;  Service: Cardiovascular;  Laterality: N/A;  . LUMBAR LAMINECTOMY  01/2011  . SHOULDER ARTHROSCOPY W/ ROTATOR CUFF REPAIR Left 1990's  . TEE WITHOUT CARDIOVERSION N/A 10/20/2015   Procedure: TRANSESOPHAGEAL ECHOCARDIOGRAM (TEE);  Surgeon: Kerin Perna, MD;  Location: [facility] OR;  Service: Open Heart Surgery;  Laterality: N/A;  . TONSILLECTOMY AND ADENOIDECTOMY  1951       Home Medications    Prior to Admission medications   Medication Sig Start Date End Date Taking? Authorizing Provider  acetaminophen (TYLENOL) 325 MG tablet Take 2 tablets (650 mg total) by  mouth every 4 (four) hours as needed for headache or mild pain. 09/18/16   Leone BrandIngold, Laura R, NP  amLODipine (NORVASC) 2.5 MG tablet Take 1 tablet (2.5 mg total) by mouth daily. 09/18/16   Leone BrandIngold, Laura R, NP  apixaban (ELIQUIS) 5 MG TABS tablet Take 1 tablet (5 mg total) by mouth 2 (two) times daily. 01/10/16   Nahser, Deloris PingPhilip J, MD  Artificial Tear Ointment (DRY EYES OP) Place 1 drop into both eyes daily as needed (for dry eyes).    [provider]  busPIRone (BUSPAR) 15 MG tablet Take 15 mg by mouth daily. 08/22/15   [provider]  carvedilol (COREG) 25 MG tablet Take 1 tablet (25 mg total) by mouth 2 (two) times daily. 11/11/16 02/09/17  Nahser, Deloris PingPhilip J, MD  clopidogrel (PLAVIX) 75 MG tablet Take 1 tablet (75 mg total) by mouth daily. 09/18/16   Leone BrandIngold, Laura R, NP  CYMBALTA 60 MG capsule Take 60 mg by mouth at bedtime.  03/30/12   [provider]  ezetimibe (ZETIA) 10 MG tablet Take 1 tablet (10 mg total) by mouth daily. 01/10/16   Nahser, Deloris PingPhilip J, MD  furosemide (LASIX) 40 MG tablet Take 1 tablet by mouth twice a day  for 3 days then go back down to 1 tablet by mouth daily. 09/27/16   Manson PasseyBhagat, Bhavinkumar, PA  HYDROcodone-acetaminophen (NORCO) 10-325 MG tablet Take 1 tablet by mouth every 6 (six) hours as needed for moderate pain. Patient taking differently: Take 0.5-2 tablets by mouth every 6 (six) hours as needed for moderate pain.  11/13/15   Barrett, Erin R, PA-C  insulin glargine (LANTUS) 100 UNIT/ML injection Inject 64 Units into the skin at bedtime.     [provider]  isosorbide mononitrate (IMDUR) 60 MG 24 hr tablet Take 1.5 tablets (90 mg total) by mouth daily. 09/26/16   Bhagat, Sharrell KuBhavinkumar, PA  LINZESS 145 MCG CAPS capsule Take 145 mcg by mouth daily as needed (constipation).  09/28/15   [provider]  mirtazapine (REMERON) 15 MG tablet Take 15 mg by mouth at bedtime.  03/30/12   [provider]  nitroGLYCERIN (NITROSTAT) 0.4 MG SL tablet Place 1 tablet (0.4 mg total) under the tongue every 5 (five) minutes as needed for chest pain. 09/18/16   Leone BrandIngold, Laura R, NP  NOVOLOG FLEXPEN 100 UNIT/ML FlexPen Inject 24 Units into the skin 2 (two) times daily.  07/01/13   [provider]  pantoprazole (PROTONIX) 40 MG tablet Take 1 tablet (40 mg total) by mouth daily. Patient taking differently: Take 40 mg by mouth daily as needed (heartburn).  11/14/15   Barrett, Rae RoamErin R, PA-C  Pitavastatin Calcium (LIVALO) 1 MG TABS Take 1 tablet (1 mg total) by mouth daily. 11/21/16   Nahser, Deloris PingPhilip J, MD  potassium chloride SA (K-DUR,KLOR-CON) 20 MEQ tablet Take 1 tablet by mouth twice a day for 3 days then go back down to 1 tablet by mouth daily 09/27/16   Bhagat, Bhavinkumar, PA  QUEtiapine (SEROQUEL) 400 MG tablet Take 200 mg by mouth at bedtime.     [provider]  ranolazine (RANEXA) 1000 MG SR tablet Take 1 tablet (1,000 mg total) by mouth 2 (two) times daily. 09/18/16   Leone BrandIngold, Laura R, NP    Family History Family History  Problem Relation Age of Onset  . Heart disease Mother   .  Heart disease Father   . Heart attack Father   . Heart disease Brother  Social History Social History  Substance Use Topics  . Smoking status: Former Smoker    Packs/day: 4.00    Years: 25.00    Types: Cigarettes  . Smokeless tobacco: Former Neurosurgeon    Types: Chew     Comment: "quit smoking ~ 1985; chewed off and on for a few years; quit chewing in ~ 1985 too"  . Alcohol use Yes     Comment: 07/11/2014 "might take a drink 5X/yr"     Allergies   Metformin and related; Pravachol; Ramipril; and Wellbutrin [bupropion hcl]   Review of Systems Review of Systems All other systems reviewed and are negative for acute change except as noted in the HPI.   Physical Exam Updated Vital Signs BP 126/73 (BP Location: Right Arm)   Pulse 96   Temp 98 F (36.7 C) (Oral)   Resp 18   SpO2 94%   Physical Exam  Constitutional: He is oriented to person, place, and time. He appears well-developed and well-nourished.  Obese  HENT:  Head: Normocephalic.  Eyes: EOM are normal.  Neck: Normal range of motion.  Cardiovascular: Normal rate, regular rhythm and normal heart sounds.   Pulmonary/Chest: Effort normal. No respiratory distress. He has decreased breath sounds (bilaterally).  Abdominal: Soft. He exhibits no distension. There is no tenderness.  Musculoskeletal: Normal range of motion.  Neurological: He is alert and oriented to person, place, and time.  Skin: Skin is warm and dry.  Psychiatric: He has a normal mood and affect.  Nursing note and vitals reviewed.    ED Treatments / Results  COORDINATION OF CARE: 11:53 AM- Will consult cardiology. Pt verbalizes understanding and agrees to plan.  Medications - No data to display  Labs (all labs ordered are listed, but only abnormal results are displayed) Labs Reviewed  BASIC METABOLIC PANEL - Abnormal; Notable for the following:       Result Value   Glucose, Bld 229 (*)    Creatinine, Ser 1.80 (*)    Calcium 8.5 (*)    GFR calc  non Af Amer 36 (*)    GFR calc Af Amer 41 (*)    All other components within normal limits  CBC - Abnormal; Notable for the following:    RBC 3.42 (*)    Hemoglobin 10.5 (*)    HCT 33.1 (*)    RDW 15.8 (*)    Platelets 123 (*)    All other components within normal limits  TROPONIN I - Abnormal; Notable for the following:    Troponin I 0.06 (*)    All other components within normal limits  I-STAT TROPOININ, ED    EKG  EKG Interpretation  Date/Time:  Wednesday December 18 2016 10:32:39 EDT Ventricular Rate:  98 PR Interval:    QRS Duration: 156 QT Interval:  428 QTC Calculation: 546 R Axis:   42 Text Interpretation:  Normal sinus rhythm Right bundle branch block Non-specific ST-t changes Confirmed by Juleen China  MD, Jeannett Senior [phone]) on 12/18/2016 11:07:15 AM       Radiology Dg Chest 2 View  Result Date: 12/18/2016 CLINICAL DATA:  Mid chest discomfort with shortness of breath for 3-4 days, stabbing pain in back in between shoulder blades, history of coronary artery disease post NSTEMI and CABG, acute diastolic CHF, type II diabetes mellitus, essential hypertension, former smoker EXAM: CHEST  2 VIEW COMPARISON:  09/10/2016 FINDINGS: Borderline enlargement of cardiac silhouette post CABG. Mediastinal contours and pulmonary vascularity normal. Atherosclerotic calcification aorta. Lungs grossly clear. No  definite infiltrate, pleural effusion or pneumothorax. Bones unremarkable. IMPRESSION: Post CABG. No acute abnormalities. Aortic Atherosclerosis (ICD10-I70.0). Electronically Signed   By: Ulyses Southward M.D.   On: 12/18/2016 11:36    Procedures Procedures (including critical care time)  Medications Ordered in ED Medications - No data to display   Initial Impression / Assessment and Plan / ED Course  I have reviewed the triage vital signs and the nursing notes.  Pertinent labs & imaging results that were available during my care of the patient were reviewed by me and considered in my medical  decision making (see chart for details).     71yM with what sounds like continued anginal symptoms. Apparently he is not a candidate for further intervention and being medically managed. Already on renexa and imdur. Will discuss with cardiology to see if there is anything more they can offer him.   Final Clinical Impressions(s) / ED Diagnoses   Final diagnoses:  Chest pain, unspecified type    New Prescriptions New Prescriptions   No medications on file   I personally preformed the services scribed in my presence. The recorded information has been reviewed is accurate. Raeford Razor, MD.     Raeford Razor, MD 12/28/16 [phone]

## 2016-12-19 DIAGNOSIS — I119 Hypertensive heart disease without heart failure: Secondary | ICD-10-CM | POA: Diagnosis not present

## 2016-12-19 DIAGNOSIS — E119 Type 2 diabetes mellitus without complications: Secondary | ICD-10-CM | POA: Diagnosis not present

## 2016-12-19 DIAGNOSIS — I2782 Chronic pulmonary embolism: Secondary | ICD-10-CM

## 2016-12-19 DIAGNOSIS — I5031 Acute diastolic (congestive) heart failure: Secondary | ICD-10-CM | POA: Diagnosis not present

## 2016-12-19 DIAGNOSIS — R079 Chest pain, unspecified: Secondary | ICD-10-CM | POA: Diagnosis not present

## 2016-12-19 DIAGNOSIS — I200 Unstable angina: Secondary | ICD-10-CM | POA: Diagnosis not present

## 2016-12-19 LAB — GLUCOSE, CAPILLARY
GLUCOSE-CAPILLARY: 201 mg/dL — AB (ref 65–99)
GLUCOSE-CAPILLARY: 224 mg/dL — AB (ref 65–99)
GLUCOSE-CAPILLARY: 181 mg/dL — AB (ref 65–99)
Glucose-Capillary: 256 mg/dL — ABNORMAL HIGH (ref 65–99)

## 2016-12-19 LAB — TROPONIN I: TROPONIN I: 1.86 ng/mL — AB (ref <0.03)

## 2016-12-19 MED ORDER — INSULIN ASPART 100 UNIT/ML ~~LOC~~ SOLN
0.0000 [IU] | Freq: Every day | SUBCUTANEOUS | Status: DC
Start: 2016-12-19 — End: 2016-12-20

## 2016-12-19 MED ORDER — INSULIN ASPART 100 UNIT/ML ~~LOC~~ SOLN
0.0000 [IU] | Freq: Three times a day (TID) | SUBCUTANEOUS | Status: DC
  Administered 2016-12-19: 5 [IU] via SUBCUTANEOUS
  Administered 2016-12-19: 8 [IU] via SUBCUTANEOUS
  Administered 2016-12-20: 5 [IU] via SUBCUTANEOUS

## 2016-12-19 NOTE — Care Management Note (Signed)
Case Management Note  Patient Details  Name: Tyler Le MRN: [id number] Date of Birth: 06-Dec-1945  Subjective/Objective:  Pt presented for unstable angina. Pt is from home with family support.                    Action/Plan: CM did discuss home needs with patient. Pt states he will not need HH Services at this time. CM did state to pt that he needs services once he is d/c to contact his PCP to get set up. No further needs from CM at this time.   Expected Discharge Date:                  Expected Discharge Plan:  Home/Self Care  In-House Referral:  NA  Discharge planning Services  CM Consult  Post Acute Care Choice:  NA Choice offered to:  NA  DME Arranged:  N/A DME Agency:  NA  HH Arranged:  NA HH Agency:  NA  Status of Service:  Completed, signed off  If discussed at Long Length of Stay Meetings, dates discussed:    Additional Comments:  Gala LewandowskyGraves-Bigelow, Nylah Butkus Kaye, RN 12/19/2016, 2:30 PM

## 2016-12-19 NOTE — Care Management Obs Status (Signed)
MEDICARE OBSERVATION STATUS NOTIFICATION   Patient Details  Name: Tyler DeutscherDwight E Weckerly MRN: [id number] Date of Birth: 1945/10/09   Medicare Observation Status Notification Given:  Yes    Gala LewandowskyGraves-Bigelow, Haydan Wedig Kaye, RN 12/19/2016, 2:29 PM

## 2016-12-19 NOTE — Discharge Instructions (Signed)

## 2016-12-19 NOTE — Progress Notes (Signed)
Results for Clide DeutscherSMITH, Mansel E (MRN [id number]) as of 12/19/2016 10:32  Ref. Range 12/18/2016 16:45 12/18/2016 21:07 12/19/2016 07:38  Glucose-Capillary Latest Ref Range: 65 - 99 mg/dL 409143 (H) 811235 (H) 914201 (H)  Noted that blood sugars have been greater than 180 mg/dl while in the hospital.  Recommend starting at least 1/2 of home dose of Lantus. Recommend Lantus 30 units daily and Novolog MODERATE or RESISTANT correction scale TID & HS.  Patient takes Lantus 64 units every HS and Novolog 24 units BID at home. Will continue to monitor blood sugars while in the hospital.   Kubitz MinceKendra Belem Hintze RN BSN CDE Diabetes Coordinator Pager: [phone]  8am-5pm

## 2016-12-19 NOTE — Progress Notes (Signed)
Removed right hand IV after continuing to leak blood. A lot of bleeding, pressure applied for twenty minutes, applied pressure dressing to site. Bleeding controlled. Notifying IV team to place another IV.

## 2016-12-19 NOTE — Plan of Care (Signed)
Problem: Physical Regulation: Goal: Ability to maintain clinical measurements within normal limits will improve Outcome: Progressing Vital signs stable throughout night. CP remains stable. 3-5/10. Unchanged since admission, Denies any SOB. Will continue to monitor.

## 2016-12-19 NOTE — Progress Notes (Signed)
Glucose level at 0738 was 201, noticed patient was not on SSI. At 1057 patient glucose level was 256, notified Dr. Melburn PopperNasher, cardiologist in error. Consulted nurse in charge, notified PA, Marlon Peliffany Greene. New orders received.

## 2016-12-19 NOTE — Progress Notes (Signed)
Progress Note  Patient Name: Tyler Le Date of Encounter: 12/19/2016  Primary Cardiologist: Nahser  Subjective   71 yo with severe native CAD and graft disease Presents with symptoms of unstable angina Feeling better this am   troponins are gradually increasing .    Inpatient Medications    Scheduled Meds: . amLODipine  2.5 mg Oral Daily  . apixaban  5 mg Oral BID  . busPIRone  15 mg Oral Daily  . carvedilol  37.5 mg Oral BID  . clopidogrel  75 mg Oral Q24H  . DULoxetine  60 mg Oral QHS  . ezetimibe  10 mg Oral Daily  . furosemide  40 mg Oral Daily  . isosorbide mononitrate  90 mg Oral Daily  . mirtazapine  15 mg Oral QHS  . pantoprazole  40 mg Oral Daily  . potassium chloride SA  20 mEq Oral Daily  . QUEtiapine  200 mg Oral QHS  . ranolazine  1,000 mg Oral BID  . sodium chloride flush  3 mL Intravenous Q12H   Continuous Infusions: . sodium chloride     PRN Meds: sodium chloride, acetaminophen, linaclotide, nitroGLYCERIN, nitroGLYCERIN, ondansetron (ZOFRAN) IV, sodium chloride flush   Vital Signs    Vitals:   12/18/16 1645 12/18/16 2100 12/19/16 0526 12/19/16 0548  BP: 133/71 (!) 121/58 (!) 121/59 121/63  Pulse: 84 85 83 72  Resp:      Temp: 97.8 F (36.6 C) 98.7 F (37.1 C) 98.4 F (36.9 C) 97.8 F (36.6 C)  TempSrc: Oral Oral Oral Oral  SpO2: 98% 93% 94% 96%  Weight:   (!) 303 lb (137.4 kg) (!) 311 lb (141.1 kg)   No intake or output data in the 24 hours ending 12/19/16 1032 Filed Weights   12/18/16 1103 12/19/16 0526 12/19/16 0548  Weight: (!) 304 lb 3.8 oz (138 kg) (!) 303 lb (137.4 kg) (!) 311 lb (141.1 kg)    Telemetry    NSR  - Personally Reviewed  ECG     NSR  - Personally Reviewed  Physical Exam   GEN: No acute distress.  Morbidly obese while male,    Neck: No JVD Cardiac: RRR, no murmurs, rubs, or gallops.  Respiratory: Clear to auscultation bilaterally. GI: Soft, nontender, non-distended  MS: No edema; No  deformity. Neuro:  Nonfocal  Psych: Normal affect   Labs    Chemistry Recent Labs Lab 12/18/16 1039  NA 140  K 4.2  CL 103  CO2 29  GLUCOSE 229*  BUN 19  CREATININE 1.80*  CALCIUM 8.5*  GFRNONAA 36*  GFRAA 41*  ANIONGAP 8     Hematology Recent Labs Lab 12/18/16 1039  WBC 5.1  RBC 3.42*  HGB 10.5*  HCT 33.1*  MCV 96.8  MCH 30.7  MCHC 31.7  RDW 15.8*  PLT 123*    Cardiac Enzymes Recent Labs Lab 12/18/16 1039 12/18/16 1650 12/18/16 2056 12/19/16 0339  TROPONINI 0.06* 1.11* 1.41* 1.86*   No results for input(s): TROPIPOC in the last 168 hours.   BNPNo results for input(s): BNP, PROBNP in the last 168 hours.   DDimer No results for input(s): DDIMER in the last 168 hours.   Radiology    Dg Chest 2 View  Result Date: 12/18/2016 CLINICAL DATA:  Mid chest discomfort with shortness of breath for 3-4 days, stabbing pain in back in between shoulder blades, history of coronary artery disease post NSTEMI and CABG, acute diastolic CHF, type II diabetes mellitus, essential hypertension,  former smoker EXAM: CHEST  2 VIEW COMPARISON:  09/10/2016 FINDINGS: Borderline enlargement of cardiac silhouette post CABG. Mediastinal contours and pulmonary vascularity normal. Atherosclerotic calcification aorta. Lungs grossly clear. No definite infiltrate, pleural effusion or pneumothorax. Bones unremarkable. IMPRESSION: Post CABG. No acute abnormalities. Aortic Atherosclerosis (ICD10-I70.0). Electronically Signed   By: Ulyses Southward M.D.   On: 12/18/2016 11:36    Cardiac Studies      Patient Profile     71 y.o. male with known CAD Admitted with prolonged , progressive unstable angina   Assessment & Plan    1.  Unstable angina:   symptms are c/w UAP I have personally reviewed the cath from March and have reviewed with Dr. Clifton James on the interventional team .  He has very severe anatomy and little to no options for PCI He is pain free. We agree that our best option is for  contined medical therapy. We have increased Coreg. Will allow him to take as many SL NTG as he wants I think we should keep him another day and see if Troponins trend downward and to make sure he is stable   His is on plavix and eliquis   2.  Pulmonary embolus:  Continue eliquis   3.  Morbid obesity:  Have given him the name of a book to read Reverse and prevent heart disease - Clarice Pole,   Signed, Kristeen Miss, MD  12/19/2016, 10:32 AM

## 2016-12-20 ENCOUNTER — Telehealth: Payer: Self-pay | Admitting: Nurse Practitioner

## 2016-12-20 DIAGNOSIS — I209 Angina pectoris, unspecified: Secondary | ICD-10-CM

## 2016-12-20 DIAGNOSIS — R079 Chest pain, unspecified: Secondary | ICD-10-CM | POA: Diagnosis not present

## 2016-12-20 DIAGNOSIS — I2510 Atherosclerotic heart disease of native coronary artery without angina pectoris: Secondary | ICD-10-CM

## 2016-12-20 DIAGNOSIS — I5031 Acute diastolic (congestive) heart failure: Secondary | ICD-10-CM | POA: Diagnosis not present

## 2016-12-20 DIAGNOSIS — I119 Hypertensive heart disease without heart failure: Secondary | ICD-10-CM | POA: Diagnosis not present

## 2016-12-20 DIAGNOSIS — E119 Type 2 diabetes mellitus without complications: Secondary | ICD-10-CM | POA: Diagnosis not present

## 2016-12-20 LAB — GLUCOSE, CAPILLARY
Glucose-Capillary: 217 mg/dL — ABNORMAL HIGH (ref 65–99)
Glucose-Capillary: 243 mg/dL — ABNORMAL HIGH (ref 65–99)

## 2016-12-20 MED ORDER — POTASSIUM CHLORIDE CRYS ER 20 MEQ PO TBCR: 20.0000 meq | EXTENDED_RELEASE_TABLET | Freq: Every day | ORAL | 11 refills | Status: AC

## 2016-12-20 MED ORDER — ISOSORBIDE MONONITRATE ER 60 MG PO TB24: 90.0000 mg | ORAL_TABLET | Freq: Every day | ORAL | 11 refills | Status: AC

## 2016-12-20 MED ORDER — FUROSEMIDE 40 MG PO TABS: ORAL_TABLET | ORAL | 11 refills | Status: AC

## 2016-12-20 MED ORDER — CARVEDILOL 25 MG PO TABS: 37.5000 mg | ORAL_TABLET | Freq: Two times a day (BID) | ORAL | 3 refills | Status: AC

## 2016-12-20 NOTE — Discharge Summary (Signed)
Discharge Summary    Patient ID: Tyler Le,  MRN: [id number], DOB/AGE: 11-01-1945 71 y.o.  Admit date: 12/18/2016 Discharge date: 12/20/2016   Primary Care Provider: Kaleen Le Primary Cardiologist: Dr. Elease Le  Discharge Diagnoses    Principal Problem:   CAD (coronary artery disease) with re-do CABG 09/2015 (first CABG in 2001)  Active Problems:   S/P CABG (coronary artery bypass graft)   Type II diabetes mellitus (HCC)   Dyslipidemia   Benign hypertensive heart disease without heart failure   Hypertension   Diabetes mellitus type 2 in obese (HCC)   CKD (chronic kidney disease), stage III   Angina pectoris (HCC)   Allergies Allergies  Allergen Reactions  . Metformin And Related Nausea And Vomiting  . Pravachol Nausea And Vomiting  . Ramipril Nausea Only  . Wellbutrin [Bupropion Hcl] Nausea And Vomiting     History of Present Illness     Tyler Le is a 71 y.o. male with history of CAD s/p CABG x 2 in 2001 and 2017, BMS placed 2012 to saphenous vein graft, hypertension, diabetes, dyslipidemia, pulmonary embolism, RBBB, obesity.  Patient last seen in the office by Dr. Elease Le on 11/11/2016 for follow-up office visit. He has known ischemic heart disease. He underwent a cardiac catheterization most recently in March of 2018, he has severe diffuse native coronary disease with very poor PCI targets. He also has 2/3 new vein grafts occluded including the new vein graft to RCA and new vein graft to distal LAD. In addition to the new vein graft disease, he has progression of disease in the native LAD-diagonal system as well as the AV groove circumflex system. The AV groove system has collaterals to the distal RCA. Pt does not have any immediately favorable PCI targets, recommendation for medical management.  He presented to the ER on 12/18/16 with complaints of pain between his shoulder blades that feels like a knife being stabbed to his back for the past 3 days.  He stated that the pain feels like his previous cardiac pain. He had associated tightness in his chest,  BLE edema that he describes as being chronic in nature,  intermittent lightheadedness and vision changes, that all resolved on its own at 5-10 minutes. He has been taking NTG every day this week for his symptoms. He took 3 sl NTG today when his pain became 10/10 and unmanageable he decided to seek care. Now he is having 3/10 pain and it is bearable.   In the ED, troponin was 0.06, previous admission in March it was 1.5.   Hospital Course     Consultants: None  Tyler Le symptoms were consistent with unstable angina. However, given his poor targets, his cath images from 09/13/16 were reviewed with Dr. Clifton Le (interventionalist) and it was determined that he should be managed medically. Coreg and imdur doses were increased with chest pain relief. He was also instructed to take SL nitro as needed. Troponin peaked at 1.86 on 12/19/16 (downward trend not collected).   During this hospitalization, supplemental O2 was applied at night. Before discharge, he ambulated in the hall without O2 and maintained 95-97% O2 saturations. Discussed with Dr. Elease Le. It was felt he was safe to discharge home and see back for a sleep study.   Currently, he is not requiring O2 and remains chest pain-free.  He was discharged on Plavix (CAD) and eliquis (PE). He was also discharged on coreg, imdur, norvasc, and ranexa.  His last dry weight  noted in clinic note on 11/11/16 was 298 lbs. Today, his weight was 302 lbs, down from 304 lbs at admission. Will discharge on his home 40 mg lasix daily (and home potassium regimen). Instructed him to take an extra one-half tablet (20 gm) lasix if weight increased by 3 lbs in one day. Please check BMP at follow up and need for increased scheduled lasix.  Patient seen and examined by Dr. Elease Le today and was stable for discharge. All follow up has been  arranged.  _____________  Discharge Vitals Blood pressure 126/60, pulse 79, temperature 98 F (36.7 C), temperature source Oral, resp. rate 19, weight (!) 302 lb (137 kg), SpO2 90 %.  Filed Weights   12/19/16 0526 12/19/16 0548 12/20/16 0518  Weight: (!) 303 lb (137.4 kg) (!) 311 lb (141.1 kg) (!) 302 lb (137 kg)    Labs & Radiologic Studies    CBC  Recent Labs  12/18/16 1039  WBC 5.1  HGB 10.5*  HCT 33.1*  MCV 96.8  PLT 123*   Basic Metabolic Panel  Recent Labs  12/18/16 1039  NA 140  K 4.2  CL 103  CO2 29  GLUCOSE 229*  BUN 19  CREATININE 1.80*  CALCIUM 8.5*   Liver Function Tests No results for input(s): AST, ALT, ALKPHOS, BILITOT, PROT, ALBUMIN in the last 72 hours. No results for input(s): LIPASE, AMYLASE in the last 72 hours. Cardiac Enzymes  Recent Labs  12/18/16 1650 12/18/16 2056 12/19/16 0339  TROPONINI 1.11* 1.41* 1.86*   BNP Invalid input(s): POCBNP D-Dimer No results for input(s): DDIMER in the last 72 hours. Hemoglobin A1C No results for input(s): HGBA1C in the last 72 hours. Fasting Lipid Panel No results for input(s): CHOL, HDL, LDLCALC, TRIG, CHOLHDL, LDLDIRECT in the last 72 hours. Thyroid Function Tests No results for input(s): TSH, T4TOTAL, T3FREE, THYROIDAB in the last 72 hours.  Invalid input(s): FREET3 _____________  Dg Chest 2 View  Result Date: 12/18/2016 CLINICAL DATA:  Mid chest discomfort with shortness of breath for 3-4 days, stabbing pain in back in between shoulder blades, history of coronary artery disease post NSTEMI and CABG, acute diastolic CHF, type II diabetes mellitus, essential hypertension, former smoker EXAM: CHEST  2 VIEW COMPARISON:  09/10/2016 FINDINGS: Borderline enlargement of cardiac silhouette post CABG. Mediastinal contours and pulmonary vascularity normal. Atherosclerotic calcification aorta. Lungs grossly clear. No definite infiltrate, pleural effusion or pneumothorax. Bones unremarkable. IMPRESSION:  Post CABG. No acute abnormalities. Aortic Atherosclerosis (ICD10-I70.0). Electronically Signed   By: Tyler Le M.D.   On: 12/18/2016 11:36     Diagnostic Studies/Procedures     Heart catheterization 09/13/16:  Prox LAD ~70 %stenosed. Mid LAD lesion, 100 %stenosed.  The distal LAD is filled via the LIMA graft. The distal LAD is diffusely diseased. Dist LAD-1 lesion, 80 %stenosed. Dist LAD-2 lesion, 80 %stenosed.  The New SVG-dLAD from the hood of the new SVG-OM2 is 100% occluded at the hood.  Ost 1st Diag lesion, 95 %stenosed. 2nd Diag lesion, 100 %stenosed.- the prior SCG-Diag is known to be occluded.  Ramus lesion, 100 %stenosed. Fills via old SVG with Ostial Stent.  Old SVG-Ramus Intermedius: Origin to Mid Graft DES, 45 %stenosed.  Prox Cx to Dist Cx lesion, 95 %stenosed. ~At the takeoff of OM2  Ost 1st Mrg to 1st Mrg lesion, 100 %stenosed.  New SVG-OM1 and is normal in caliber and anatomically normal. Fills a small caliber diffusely diseased OM branch with backfilling to the native diseased AV Groove Cx.  Ost RCA to Dist RCA lesion, 100 %stenosed. -The posterior lateral segment branches and RPDA are filled via left to right collaterals from the AV groove circumflex it is diffusely diseased.  New SVG-RCA: Prox Graft lesion, 100 %stenosed.  The left ventricular systolic function is normal. The left ventricular ejection fraction is 50-55% by visual estimate.  LV end diastolic pressure is moderately elevated.   The patient has severe diffuse native coronary disease with very poor PCI targets. He has now 2 out of the 3 new vein grafts that are occluded including the new vein graft to RCA and new vein graft to distal LAD. In addition to the new vein graft disease, he has progression of disease in the native LAD-diagonal system as well as the AV groove circumflex system. The AV groove system arise collaterals to the distal RCA.   Unfortunately, there are not any immediately  favorable PCI targets.   Recommendation would be to optimize medical management.  Plan:  Return to nursing unit for TR band removal.  Would restart IV heparin given positive troponins.   Defer to primary service for titration of cardiac medications.  I will write for a one-time dose of IV Lasix tonight given elevated LVEDP. He may require additional diuresis.  Echocardiogram 12/04/15: Study Conclusions - Left ventricle: The cavity size was normal. Wall thickness was increased in a pattern of moderate LVH. Systolic function was normal. The estimated ejection fraction was in the range of 55% to 60%. Wall motion was normal; there were no regional wall motion abnormalities. Features are consistent with a pseudonormal left ventricular filling pattern, with concomitant abnormal relaxation and increased filling pressure (grade 2 diastolic dysfunction). - Aortic valve: Trileaflet; moderately thickened, moderately calcified leaflets. - Aorta: Aortic root dimension: 38 mm (ED). - Ascending aorta: The ascending aorta was mildly dilated. - Right ventricle: The cavity size was mildly dilated. Wall thickness was normal. Systolic function was mildly reduced. - Right atrium: The atrium was mildly dilated. - Tricuspid valve: There was moderate regurgitation. - Pulmonary arteries: Systolic pressure was moderately increased. PA peak pressure: 52 mm Hg (S).  Impressions: - Compared to the prior study, there has been no significant interval change.  Disposition   Pt is being discharged home today in good condition.  Follow-up Plans & Appointments    Follow-up Information    [name], NP Follow up on 12/31/2016.   Specialties:  Nurse Practitioner, Interventional Cardiology, Cardiology, Radiology Why:  2:00pm for hospital follow up and TCM Contact information: [address] [place] [address] [phone]          Discharge  Instructions    Diet - low sodium heart healthy    Complete by:  As directed    Increase activity slowly    Complete by:  As directed       Discharge Medications   Current Discharge Medication List    CONTINUE these medications which have CHANGED   Details  carvedilol (COREG) 25 MG tablet Take 1.5 tablets (37.5 mg total) by mouth 2 (two) times daily. Qty: 180 tablet, Refills: 3    furosemide (LASIX) 40 MG tablet Take one tablet daily. May take an extra one-half tablet if weight increases 3 lbs in one day. Qty: 45 tablet, Refills: 11    isosorbide mononitrate (IMDUR) 60 MG 24 hr tablet Take 1.5 tablets (90 mg total) by mouth daily. Qty: 45 tablet, Refills: 11    potassium chloride SA (K-DUR,KLOR-CON) 20 MEQ tablet Take  1 tablet (20 mEq total) by mouth daily. Qty: 30 tablet, Refills: 11      CONTINUE these medications which have NOT CHANGED   Details  amLODipine (NORVASC) 2.5 MG tablet Take 1 tablet (2.5 mg total) by mouth daily. Qty: 30 tablet, Refills: 6    apixaban (ELIQUIS) 5 MG TABS tablet Take 1 tablet (5 mg total) by mouth 2 (two) times daily. Qty: 60 tablet, Refills: 11    Artificial Tear Ointment (DRY EYES OP) Place 1 drop into both eyes daily as needed (for dry eyes).    busPIRone (BUSPAR) 15 MG tablet Take 15 mg by mouth daily.    clopidogrel (PLAVIX) 75 MG tablet Take 1 tablet (75 mg total) by mouth daily. Qty: 30 tablet, Refills: 6    CYMBALTA 60 MG capsule Take 60 mg by mouth at bedtime.     ezetimibe (ZETIA) 10 MG tablet Take 1 tablet (10 mg total) by mouth daily. Qty: 30 tablet, Refills: 11    HYDROcodone-acetaminophen (NORCO) 10-325 MG tablet Take 1 tablet by mouth every 6 (six) hours as needed for moderate pain. Qty: 30 tablet, Refills: 0    insulin glargine (LANTUS) 100 UNIT/ML injection Inject 64 Units into the skin at bedtime.     LINZESS 145 MCG CAPS capsule Take 145 mcg by mouth daily as needed (constipation).     mirtazapine (REMERON) 15 MG  tablet Take 15 mg by mouth at bedtime.     nitroGLYCERIN (NITROSTAT) 0.4 MG SL tablet Place 1 tablet (0.4 mg total) under the tongue every 5 (five) minutes as needed for chest pain. Qty: 25 tablet, Refills: 4    NOVOLOG FLEXPEN 100 UNIT/ML FlexPen Inject 24 Units into the skin 2 (two) times daily.     Pitavastatin Calcium (LIVALO) 1 MG TABS Take 1 tablet (1 mg total) by mouth daily. Qty: 90 tablet, Refills: 1    QUEtiapine (SEROQUEL) 400 MG tablet Take 200 mg by mouth at bedtime.     ranolazine (RANEXA) 1000 MG SR tablet Take 1 tablet (1,000 mg total) by mouth 2 (two) times daily. Qty: 60 tablet, Refills: 6    acetaminophen (TYLENOL) 325 MG tablet Take 2 tablets (650 mg total) by mouth every 4 (four) hours as needed for headache or mild pain.    pantoprazole (PROTONIX) 40 MG tablet Take 1 tablet (40 mg total) by mouth daily.           Outstanding Labs/Studies   Needs sleep study OP  BMP at follow up, assess need for increased lasix.  Duration of Discharge Encounter   Greater than 30 minutes including physician time.  Signed, Roe Rutherfordngela Nicole Duke PA-C 12/20/2016, 11:23 AM  Attending Note:   The patient was seen and examined.  Agree with assessment and plan as noted above.  Changes made to the above note as needed.  Patient seen and independently examined with Bettina GaviaAngie Duke, PA .   We discussed all aspects of the encounter. I agree with the assessment and plan as stated above.  1.  CAD :   Karren BurlyDwight has severe native cad and graft disease Had a cath 4 months ago - no good options for PCI His diet has been poor.   Eats lots of fast foods and greasy foods. We discussed the fact that we were very limited in what we would be able to do  I have suggest a plant based diet primarily. He is willing to give it a try   He can have unlimited  number of NTG - he will likely take several every day   Will follow up with him in the office    I have spent a total of 40 minutes with  patient reviewing hospital  notes , telemetry, EKGs, labs and examining patient as well as establishing an assessment and plan that was discussed with the patient. > 50% of time was spent in direct patient care.    Vesta Mixer, Montez Hageman., MD, [facility] 12/20/2016, 11:35 AM [address]  [address] Office [phone] Pager [phone]

## 2016-12-20 NOTE — Telephone Encounter (Signed)
TCM Phone Call-Please call Pt-Appointment with Mariane DuvalLori Gerharhardt on 12-31-16.

## 2016-12-20 NOTE — Telephone Encounter (Signed)
Per review of Pt chart, Pt remains hospitalized at this time.  Will cont to follow for post hospitalization needs.

## 2016-12-20 NOTE — Plan of Care (Signed)
Problem: Pain Managment: Goal: General experience of comfort will improve Outcome: Progressing Pt had one episode of CP after exertion over night. Nitro given X1 w/ relief of symptoms.  Problem: Physical Regulation: Goal: Ability to maintain clinical measurements within normal limits will improve Outcome: Progressing Pt NSR throughout night. Attempted to wean off 2L O2 while sleeping. Pt had desaturation on RA down to 85%. Placed back on [place] 2L w/ return to 94%. Denied any SOB at this time. Other VSS. Pt did one lap around unit w/ tech. Tolerated well.

## 2016-12-20 NOTE — Progress Notes (Signed)
Progress Note  Patient Name: Tyler Le Date of Encounter: 12/20/2016  Primary Cardiologist: Dr. Elease Hashimoto  Subjective   Patient is feeling well; denies chest pain, SOB, and palpitations. Pt states he is ready to discharge home.  Inpatient Medications    Scheduled Meds: . amLODipine  2.5 mg Oral Daily  . apixaban  5 mg Oral BID  . busPIRone  15 mg Oral Daily  . carvedilol  37.5 mg Oral BID  . clopidogrel  75 mg Oral Q24H  . DULoxetine  60 mg Oral QHS  . ezetimibe  10 mg Oral Daily  . furosemide  40 mg Oral Daily  . insulin aspart  0-15 Units Subcutaneous TID WC  . insulin aspart  0-5 Units Subcutaneous QHS  . isosorbide mononitrate  90 mg Oral Daily  . mirtazapine  15 mg Oral QHS  . pantoprazole  40 mg Oral Daily  . potassium chloride SA  20 mEq Oral Daily  . QUEtiapine  200 mg Oral QHS  . ranolazine  1,000 mg Oral BID  . sodium chloride flush  3 mL Intravenous Q12H   Continuous Infusions: . sodium chloride     PRN Meds: sodium chloride, acetaminophen, linaclotide, nitroGLYCERIN, nitroGLYCERIN, ondansetron (ZOFRAN) IV, sodium chloride flush   Vital Signs    Vitals:   12/19/16 0548 12/19/16 1457 12/19/16 2148 12/20/16 0518  BP: 121/63 135/64 (!) 126/58 114/60  Pulse: 72 80 86 79  Resp:  19    Temp: 97.8 F (36.6 C) 98.2 F (36.8 C) 98.4 F (36.9 C) 98 F (36.7 C)  TempSrc: Oral Oral Oral Oral  SpO2: 96% 93% (!) 87% 90%  Weight: (!) 311 lb (141.1 kg)   (!) 302 lb (137 kg)    Intake/Output Summary (Last 24 hours) at 12/20/16 0735 Last data filed at 12/20/16 0522  Gross per 24 hour  Intake              640 ml  Output              100 ml  Net              540 ml   Filed Weights   12/19/16 0526 12/19/16 0548 12/20/16 0518  Weight: (!) 303 lb (137.4 kg) (!) 311 lb (141.1 kg) (!) 302 lb (137 kg)     Physical Exam   General: Well developed, well nourished, male appearing in no acute distress. Head: Normocephalic, atraumatic.  Neck: Supple without  bruits, no JVD Lungs:  Resp regular and unlabored, CTA, occasional crackle in base Heart: RRR, S1, S2, no S3, S4, soft systolic murmur; no rub. Abdomen: Soft, non-tender, non-distended with normoactive bowel sounds. No hepatomegaly. No rebound/guarding. No obvious abdominal masses. Extremities: No clubbing, cyanosis, trace edema. Distal pedal pulses are 1+ bilaterally. Neuro: Alert and oriented X 3. Moves all extremities spontaneously. Psych: Normal affect.  Labs    Chemistry Recent Labs Lab 12/18/16 1039  NA 140  K 4.2  CL 103  CO2 29  GLUCOSE 229*  BUN 19  CREATININE 1.80*  CALCIUM 8.5*  GFRNONAA 36*  GFRAA 41*  ANIONGAP 8     Hematology Recent Labs Lab 12/18/16 1039  WBC 5.1  RBC 3.42*  HGB 10.5*  HCT 33.1*  MCV 96.8  MCH 30.7  MCHC 31.7  RDW 15.8*  PLT 123*    Cardiac Enzymes Recent Labs Lab 12/18/16 1039 12/18/16 1650 12/18/16 2056 12/19/16 0339  TROPONINI 0.06* 1.11* 1.41* 1.86*   No  results for input(s): TROPIPOC in the last 168 hours.   BNPNo results for input(s): BNP, PROBNP in the last 168 hours.   DDimer No results for input(s): DDIMER in the last 168 hours.   Radiology    Dg Chest 2 View  Result Date: 12/18/2016 CLINICAL DATA:  Mid chest discomfort with shortness of breath for 3-4 days, stabbing pain in back in between shoulder blades, history of coronary artery disease post NSTEMI and CABG, acute diastolic CHF, type II diabetes mellitus, essential hypertension, former smoker EXAM: CHEST  2 VIEW COMPARISON:  09/10/2016 FINDINGS: Borderline enlargement of cardiac silhouette post CABG. Mediastinal contours and pulmonary vascularity normal. Atherosclerotic calcification aorta. Lungs grossly clear. No definite infiltrate, pleural effusion or pneumothorax. Bones unremarkable. IMPRESSION: Post CABG. No acute abnormalities. Aortic Atherosclerosis (ICD10-I70.0). Electronically Signed   By: Ulyses SouthwardMark  Boles M.D.   On: 12/18/2016 11:36     Telemetry      NSR in the 80s - Personally Reviewed  ECG    No new tracings - Personally Reviewed   Cardiac Studies   Left heart cath 09/13/16:  Prox LAD ~70 %stenosed. Mid LAD lesion, 100 %stenosed.  The distal LAD is filled via the LIMA graft. The distal LAD is diffusely diseased. Dist LAD-1 lesion, 80 %stenosed. Dist LAD-2 lesion, 80 %stenosed.  The New SVG-dLAD from the hood of the new SVG-OM2 is 100% occluded at the hood.  Ost 1st Diag lesion, 95 %stenosed. 2nd Diag lesion, 100 %stenosed.- the prior SCG-Diag is known to be occluded.  Ramus lesion, 100 %stenosed. Fills via old SVG with Ostial Stent.  Old SVG-Ramus Intermedius: Origin to Mid Graft DES, 45 %stenosed.  Prox Cx to Dist Cx lesion, 95 %stenosed. ~At the takeoff of OM2  Ost 1st Mrg to 1st Mrg lesion, 100 %stenosed.  New SVG-OM1 and is normal in caliber and anatomically normal. Fills a small caliber diffusely diseased OM branch with backfilling to the native diseased AV Groove Cx.  Ost RCA to Dist RCA lesion, 100 %stenosed. -The posterior lateral segment branches and RPDA are filled via left to right collaterals from the AV groove circumflex it is diffusely diseased.  New SVG-RCA: Prox Graft lesion, 100 %stenosed.  The left ventricular systolic function is normal. The left ventricular ejection fraction is 50-55% by visual estimate.  LV end diastolic pressure is moderately elevated.   The patient has severe diffuse native coronary disease with very poor PCI targets. He has now 2 out of the 3 new vein grafts that are occluded including the new vein graft to RCA and new vein graft to distal LAD. In addition to the new vein graft disease, he has progression of disease in the native LAD-diagonal system as well as the AV groove circumflex system. The AV groove system arise collaterals to the distal RCA.   Unfortunately, there are not any immediately favorable PCI targets.   Recommendation would be to optimize medical  management.  Plan:  Return to nursing unit for TR band removal.  Would restart IV heparin given positive troponins.   Defer to primary service for titration of cardiac medications.  I will write for a one-time dose of IV Lasix tonight given elevated LVEDP. He may require additional diuresis.   Echocardiogram 12/04/15: Study Conclusions  - Left ventricle: The cavity size was normal. Wall thickness was   increased in a pattern of moderate LVH. Systolic function was   normal. The estimated ejection fraction was in the range of 55%   to 60%. Wall  motion was normal; there were no regional wall   motion abnormalities. Features are consistent with a pseudonormal   left ventricular filling pattern, with concomitant abnormal   relaxation and increased filling pressure (grade 2 diastolic   dysfunction). - Aortic valve: Trileaflet; moderately thickened, moderately   calcified leaflets. - Aorta: Aortic root dimension: 38 mm (ED). - Ascending aorta: The ascending aorta was mildly dilated. - Right ventricle: The cavity size was mildly dilated. Wall   thickness was normal. Systolic function was mildly reduced. - Right atrium: The atrium was mildly dilated. - Tricuspid valve: There was moderate regurgitation. - Pulmonary arteries: Systolic pressure was moderately increased.   PA peak pressure: 52 mm Hg (S).  Impressions:  - Compared to the prior study, there has been no significant   interval change.   Patient Profile     71 y.o. male with severe native CAD s/p redo CABG (09/2015) and graft disease, ICM, HTN, and saddle PE presented with symptoms of unstable angina and mildly elevated troponin.  Assessment & Plan    1. Unstable angina - after review with Dr. Clifton James, PCI is not an option - he is currently chest pain free - continue medical therapy with plavix, norvasc, increased coreg, lasix, and increased imdur   2. PE - continue eliquis - wean supplemental O2 and ambulate  in hall   3. Chronic diastolic heart failure, grad 2 DD - continue lasix - would increase home dose to 40 mg BID until follow up in OP clinic     Signed, Marcelino Duster , PA-C 7:35 AM 12/20/2016 Pager: [phone]

## 2016-12-23 ENCOUNTER — Telehealth: Payer: Self-pay

## 2016-12-23 NOTE — Telephone Encounter (Signed)
Outreach made to Pt for TCM follow up.  Call went to VM.  Left message with Pt with upcoming appt information.  Notified that this nurse would call again tomorrow morning.  Name and # left for call back if any concerns.

## 2016-12-23 NOTE — Telephone Encounter (Signed)
error 

## 2016-12-24 ENCOUNTER — Telehealth: Payer: Self-pay | Admitting: *Deleted

## 2016-12-24 DIAGNOSIS — G4734 Idiopathic sleep related nonobstructive alveolar hypoventilation: Secondary | ICD-10-CM

## 2016-12-24 NOTE — Telephone Encounter (Signed)
[facility]. Patient understands to follow up with provider Norma FredricksonLori Gerhardt NP on 12/31/2016 at [facility]. Patient understands discharge instructions? yes Patient understands medications and regiment? yes  Outreach made to Pt for TCM follow up.  Asked Pt how he had been feeling.  Per Pt he has been a "little short of breath" but overall doing ok.  Pt states he has filled all his prescriptions and taking as directed.  Pt verified follow up appt.  Pt denies needs.

## 2016-12-24 NOTE — Telephone Encounter (Signed)
Hello Tyler Le, I scheduled this patient for a sleep study and I called to make sure the date (Thursday February 20 2017) was good for him but the patient says he not going to do a sleep study. I will wait to hear back from you before I cancel it. Thanks, Coralee NorthNina

## 2016-12-24 NOTE — Telephone Encounter (Signed)
Call placed to Pt.  Pt states he is sleeping.  Pt requests this nurse call him back later.  Will attempt outreach this afternoon.

## 2016-12-24 NOTE — Telephone Encounter (Signed)
-----   Message from Marcelino DusterAngela Nicole Duke, [place] sent at 12/20/2016 11:01 AM EDT ----- Regarding: needs OP sleep study 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 Can you please set him up for a sleep study outpatient? There was question about O2 needs at night. If you aren't the correct person to contact, please let me know who I should ask.  Thanks! Angie

## 2016-12-31 ENCOUNTER — Ambulatory Visit: Payer: Medicare Other | Admitting: Nurse Practitioner

## 2016-12-31 NOTE — Telephone Encounter (Signed)
Patient refused his sleep study. Sleep study has been canceled 12/31/2016 with Kya in sleep lab.

## 2016-12-31 NOTE — Progress Notes (Deleted)
CARDIOLOGY OFFICE NOTE  Date:  12/31/2016    Tyler Le E Fitting Date of Birth: 12-21-1945 Medical Record #[id number]  PCP:  Kaleen MaskElkins, Wilson Oliver, MD  Cardiologist:  Tyrone SageGerhardt & ***    No chief complaint on file.   History of Present Illness: Tyler Le is a 71 y.o. male who presents today for a ***   Comes in today. Here with   Past Medical History:  Diagnosis Date  . Acute diastolic HF (heart failure) (HCC) 09/18/2016  . Anemia   . Anxiety   . Arthritis    "back, neck" (07/11/2014)  . CAD (coronary artery disease) with CABG 09/2015  01/15/2011  . Chronic lower back pain   . Complication of anesthesia    "they have a hard time waking him up"  . Coronary artery disease    a. 2001: s/p CABG X4;  b. 08/2010 s/p PCI/BMS to VG->RI->OM;  c. 06/2014 Cath/PCI: LM nl, LAD 15226m, LCX 7310m, RI 100, OM1 100, OM2 80, small, RCA 100ost, VG->RCA 100, VG->Diag 100, VG->RI->OM1 70-80 ISR (4.0x24 Promus DES), LIMA->LAD nl.  . Depression   . Dyslipidemia   . Dysrhythmia   . Essential hypertension   . GERD (gastroesophageal reflux disease)   . History of blood transfusion 2001   "related to OHS"  . Morbid obesity (HCC)   . Nephrolithiasis   . RBBB   . Renal insufficiency    a. noted 06/2014 - baseline unclear as no data since 2012.  . Thrombocytopenia (HCC)    a. Borderline low platelets by labs noted in 06/2014 but also noted on prior labs as well.  . Type II diabetes mellitus (HCC)     Past Surgical History:  Procedure Laterality Date  . APPENDECTOMY  ~ 1955  . BACK SURGERY    . CARDIAC CATHETERIZATION  2001; ~ 2010  . CARDIAC CATHETERIZATION N/A 10/16/2015   Procedure: Left Heart Cath and Cors/Grafts Angiography;  Surgeon: Corky CraftsJayadeep S Varanasi, MD;  [facility];  Service: Cardiovascular;  Laterality: N/A;  . CARDIOVASCULAR STRESS TEST  07/03/2009   EF 64%  . CHOLECYSTECTOMY    . CORONARY ANGIOPLASTY WITH STENT PLACEMENT  March 2012   BMS to SVG to intermediate/OM   . CORONARY ANGIOPLASTY WITH STENT PLACEMENT  07/11/2014   "1"  . CORONARY ARTERY BYPASS GRAFT  2001   "CABG X 5"  . CORONARY ARTERY BYPASS GRAFT N/A 10/20/2015   Procedure: REDO CORONARY ARTERY BYPASS GRAFTING (CABG), TIMES THREE, USING LEFT GREATER SAPHENOUS VEIN HARVESTED ENDOSCOPICALLY AND CRYO VEIN, WITH CORONARY ENDARTERECTOMY;  Surgeon: Kerin PernaPeter Van Trigt, MD;  Location: [facility] OR;  Service: Open Heart Surgery;  Laterality: N/A;  SVG to LAD, SVG to OM, CRYOVEIN to RCA with Endarterectomy  . ENDARTERECTOMY Right 10/20/2015   Procedure: ENDARTERECTOMY CAROTID;  Surgeon: Pryor OchoaJames D Lawson, MD;  [facility];  Service: Vascular;  Laterality: Right;  . LEFT HEART CATH AND CORS/GRAFTS ANGIOGRAPHY N/A 09/13/2016   Procedure: Left Heart Cath and Cors/Grafts Angiography;  Surgeon: Marykay Lexavid W Harding, MD;  [facility];  Service: Cardiovascular;  Laterality: N/A;  . LEFT HEART CATHETERIZATION WITH CORONARY ANGIOGRAM N/A 07/11/2014   Procedure: LEFT HEART CATHETERIZATION WITH CORONARY ANGIOGRAM;  Surgeon: Peter M SwazilandJordan, MD;  [facility];  Service: Cardiovascular;  Laterality: N/A;  . LEFT HEART CATHETERIZATION WITH CORONARY/GRAFT ANGIOGRAM N/A 05/09/2011   Procedure: LEFT HEART CATHETERIZATION WITH Isabel CapriceORONARY/GRAFT ANGIOGRAM;  Surgeon: Kathleene Hazelhristopher D McAlhany, MD;  Location:  [facility] CATH LAB;  Service: Cardiovascular;  Laterality: N/A;  . LUMBAR LAMINECTOMY  01/2011  . SHOULDER ARTHROSCOPY W/ ROTATOR CUFF REPAIR Left 1990's  . TEE WITHOUT CARDIOVERSION N/A 10/20/2015   Procedure: TRANSESOPHAGEAL ECHOCARDIOGRAM (TEE);  Surgeon: Kerin Perna, MD;  [facility];  Service: Open Heart Surgery;  Laterality: N/A;  . TONSILLECTOMY AND ADENOIDECTOMY  1951     Medications: No outpatient prescriptions have been marked as taking for the 12/31/16 encounter (Appointment) with Tyler Macadamia, NP.     Allergies: Allergies  Allergen Reactions  . Metformin And Related Nausea And Vomiting  .  Pravachol Nausea And Vomiting  . Ramipril Nausea Only  . Wellbutrin [Bupropion Hcl] Nausea And Vomiting    Social History: The patient  reports that he has quit smoking. His smoking use included Cigarettes. He has a 100.00 pack-year smoking history. He has quit using smokeless tobacco. His smokeless tobacco use included Chew. He reports that he drinks alcohol. He reports that he does not use drugs.   Family History: The patient's ***family history includes Heart attack in his father; Heart disease in his brother, father, and mother.   Review of Systems: Please see the history of present illness.   Otherwise, the review of systems is positive for {NONE DEFAULTED:18576::"none"}.   All other systems are reviewed and negative.   Physical Exam: VS:  There were no vitals taken for this visit. Marland Kitchen  BMI There is no height or weight on file to calculate BMI.  Wt Readings from Last 3 Encounters:  12/20/16 (!) 302 lb (137 kg)  11/11/16 298 lb 1.9 oz (135.2 kg)  09/26/16 296 lb 3.2 oz (134.4 kg)    General: Pleasant. Well developed, well nourished and in no acute distress.   HEENT: Normal.  Neck: Supple, no JVD, carotid bruits, or masses noted.  Cardiac: ***Regular rate and rhythm. No murmurs, rubs, or gallops. No edema.  Respiratory:  Lungs are clear to auscultation bilaterally with normal work of breathing.  GI: Soft and nontender.  MS: No deformity or atrophy. Gait and ROM intact.  Skin: Warm and dry. Color is normal.  Neuro:  Strength and sensation are intact and no gross focal deficits noted.  Psych: Alert, appropriate and with normal affect.   LABORATORY DATA:  EKG:  EKG {ACTION; IS/IS WUJ:[id number]} ordered today. This demonstrates ***.  Lab Results  Component Value Date   WBC 5.1 12/18/2016   HGB 10.5 (L) 12/18/2016   HCT 33.1 (L) 12/18/2016   PLT 123 (L) 12/18/2016   GLUCOSE 229 (H) 12/18/2016   CHOL 129 09/13/2016   TRIG 299 (H) 09/13/2016   HDL 26 (L) 09/13/2016   LDLCALC  43 09/13/2016   ALT 65 (H) 11/07/2015   AST 31 11/07/2015   NA 140 12/18/2016   K 4.2 12/18/2016   CL 103 12/18/2016   CREATININE 1.80 (H) 12/18/2016   BUN 19 12/18/2016   CO2 29 12/18/2016   TSH 2.645 11/04/2015   INR 1.01 09/12/2016   HGBA1C 7.4 (H) 09/18/2016     BNP (last 3 results)  Recent Labs  09/12/16 1735  BNP 124.3*    ProBNP (last 3 results)  Recent Labs  09/26/16 1453  PROBNP 465*     Other Studies Reviewed Today:   Assessment/Plan:   Current medicines are reviewed with the patient today.  The patient does not have concerns regarding medicines other than what has been noted above.  The following changes have been made:  See  above.  Labs/ tests ordered today include:   No orders of the defined types were placed in this encounter.    Disposition:   FU with *** in {gen number 1-61:096045} {Days to years:10300}.   Patient is agreeable to this plan and will call if any problems develop in the interim.   SignedNorma Fredrickson, NP  12/31/2016 7:26 AM  [facility] [address] [place], [place]  [address] Phone: [phone] Fax: [phone]

## 2016-12-31 NOTE — Telephone Encounter (Signed)
-----   Message from Marcelino DusterAngela Nicole Duke, [place] sent at 12/26/2016 11:42 AM EDT ----- Regarding: RE: needs OP sleep study I just tried to call him to discuss the sleep study. He states he isn't going to do the sleep study. OK to cancel it.   Thank you for trying! Angie  ----- Message ----- From: Reesa ChewJones, Lenia Housley G, CMA Sent: 12/24/2016   5:08 PM To: Roe RutherfordAngela Nicole Duke, PA Subject: RE: needs OP sleep study                       Quincy SimmondsHello Angela, I scheduled this patient for a sleep study and I called to make sure the date (Thursday February 20 2017) was good for him but the patient says he not going to do a sleep study. I will wait to hear back from you before I cancel it. Thanks, Coralee NorthNina ----- Message ----- From: Marcelino Dusteruke, Angela Nicole, PA Sent: 12/20/2016  11:01 AM To: Reesa Cheworothea G Leniyah Martell, CMA Subject: needs OP sleep study                           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 Can you please set him up for a sleep study outpatient? There was question about O2 needs at night. If you aren't the correct person to contact, please let me know who I should ask.  Thanks! Angie

## 2017-01-22 DEATH — deceased

## 2017-02-20 ENCOUNTER — Encounter (HOSPITAL_BASED_OUTPATIENT_CLINIC_OR_DEPARTMENT_OTHER): Payer: Medicare Other
